# Patient Record
Sex: Female | Born: 1940 | Race: White | Hispanic: No | Smoking: Never smoker
Health system: Southern US, Community
[De-identification: ages and names within clinical notes are randomized; demographics above are authoritative.]

## PROBLEM LIST (undated history)

## (undated) DIAGNOSIS — D72820 Lymphocytosis (symptomatic): Secondary | ICD-10-CM

## (undated) DIAGNOSIS — H269 Unspecified cataract: Secondary | ICD-10-CM

## (undated) DIAGNOSIS — I5189 Other ill-defined heart diseases: Secondary | ICD-10-CM

## (undated) DIAGNOSIS — T7840XA Allergy, unspecified, initial encounter: Secondary | ICD-10-CM

## (undated) DIAGNOSIS — N183 Chronic kidney disease, stage 3 unspecified: Secondary | ICD-10-CM

## (undated) DIAGNOSIS — R739 Hyperglycemia, unspecified: Secondary | ICD-10-CM

## (undated) DIAGNOSIS — E079 Disorder of thyroid, unspecified: Secondary | ICD-10-CM

## (undated) DIAGNOSIS — I5030 Unspecified diastolic (congestive) heart failure: Secondary | ICD-10-CM

## (undated) DIAGNOSIS — R079 Chest pain, unspecified: Secondary | ICD-10-CM

## (undated) DIAGNOSIS — Z86718 Personal history of other venous thrombosis and embolism: Secondary | ICD-10-CM

## (undated) DIAGNOSIS — K8020 Calculus of gallbladder without cholecystitis without obstruction: Secondary | ICD-10-CM

## (undated) DIAGNOSIS — I10 Essential (primary) hypertension: Secondary | ICD-10-CM

## (undated) DIAGNOSIS — G919 Hydrocephalus, unspecified: Secondary | ICD-10-CM

## (undated) DIAGNOSIS — K279 Peptic ulcer, site unspecified, unspecified as acute or chronic, without hemorrhage or perforation: Secondary | ICD-10-CM

## (undated) DIAGNOSIS — K219 Gastro-esophageal reflux disease without esophagitis: Secondary | ICD-10-CM

## (undated) DIAGNOSIS — C9110 Chronic lymphocytic leukemia of B-cell type not having achieved remission: Secondary | ICD-10-CM

## (undated) DIAGNOSIS — G43909 Migraine, unspecified, not intractable, without status migrainosus: Secondary | ICD-10-CM

## (undated) DIAGNOSIS — E785 Hyperlipidemia, unspecified: Secondary | ICD-10-CM

## (undated) DIAGNOSIS — M1990 Unspecified osteoarthritis, unspecified site: Secondary | ICD-10-CM

## (undated) DIAGNOSIS — M810 Age-related osteoporosis without current pathological fracture: Secondary | ICD-10-CM

## (undated) HISTORY — PX: TONSILLECTOMY: SUR1361

## (undated) HISTORY — DX: Essential (primary) hypertension: I10

## (undated) HISTORY — DX: Gastro-esophageal reflux disease without esophagitis: K21.9

## (undated) HISTORY — DX: Unspecified cataract: H26.9

## (undated) HISTORY — PX: PARTIAL HYSTERECTOMY: SHX80

## (undated) HISTORY — PX: JOINT REPLACEMENT: SHX530

## (undated) HISTORY — DX: Other ill-defined heart diseases: I51.89

## (undated) HISTORY — PX: POLYPECTOMY: SHX149

## (undated) HISTORY — DX: Hyperglycemia, unspecified: R73.9

## (undated) HISTORY — PX: BREAST LUMPECTOMY: SHX2

## (undated) HISTORY — PX: EYE SURGERY: SHX253

## (undated) HISTORY — DX: Allergy, unspecified, initial encounter: T78.40XA

## (undated) HISTORY — PX: COLONOSCOPY: SHX174

## (undated) HISTORY — DX: Personal history of other venous thrombosis and embolism: Z86.718

## (undated) HISTORY — DX: Peptic ulcer, site unspecified, unspecified as acute or chronic, without hemorrhage or perforation: K27.9

## (undated) HISTORY — PX: CHOLECYSTECTOMY: SHX55

## (undated) HISTORY — DX: Calculus of gallbladder without cholecystitis without obstruction: K80.20

## (undated) HISTORY — PX: OTHER SURGICAL HISTORY: SHX169

## (undated) HISTORY — DX: Migraine, unspecified, not intractable, without status migrainosus: G43.909

## (undated) HISTORY — DX: Hydrocephalus, unspecified: G91.9

## (undated) HISTORY — DX: Disorder of thyroid, unspecified: E07.9

## (undated) HISTORY — DX: Chronic lymphocytic leukemia of B-cell type not having achieved remission: C91.10

## (undated) HISTORY — DX: Chronic kidney disease, stage 3 unspecified: N18.30

## (undated) HISTORY — DX: Unspecified osteoarthritis, unspecified site: M19.90

## (undated) HISTORY — DX: Chest pain, unspecified: R07.9

## (undated) HISTORY — DX: Lymphocytosis (symptomatic): D72.820

## (undated) HISTORY — DX: Age-related osteoporosis without current pathological fracture: M81.0

## (undated) HISTORY — DX: Hyperlipidemia, unspecified: E78.5

## (undated) HISTORY — PX: ABDOMINAL HYSTERECTOMY: SHX81

## (undated) HISTORY — DX: Unspecified diastolic (congestive) heart failure: I50.30

## (undated) SURGERY — MANOMETRY, ESOPHAGUS

---

## 1973-04-20 HISTORY — PX: TUBAL LIGATION: SHX77

## 1991-04-21 HISTORY — PX: BREAST CYST ASPIRATION: SHX578

## 2000-03-17 ENCOUNTER — Encounter: Payer: Self-pay | Admitting: Gastroenterology

## 2004-06-02 ENCOUNTER — Encounter: Admission: RE | Admit: 2004-06-02 | Discharge: 2004-06-02 | Payer: Self-pay | Admitting: Neurosurgery

## 2004-07-29 ENCOUNTER — Ambulatory Visit: Payer: Self-pay | Admitting: Family Medicine

## 2004-09-26 ENCOUNTER — Emergency Department (HOSPITAL_COMMUNITY): Admission: EM | Admit: 2004-09-26 | Discharge: 2004-09-26 | Payer: Self-pay | Admitting: Emergency Medicine

## 2004-12-02 ENCOUNTER — Emergency Department (HOSPITAL_COMMUNITY): Admission: EM | Admit: 2004-12-02 | Discharge: 2004-12-02 | Payer: Self-pay | Admitting: Emergency Medicine

## 2004-12-03 ENCOUNTER — Emergency Department (HOSPITAL_COMMUNITY): Admission: EM | Admit: 2004-12-03 | Discharge: 2004-12-03 | Payer: Self-pay | Admitting: Emergency Medicine

## 2005-05-27 ENCOUNTER — Ambulatory Visit (HOSPITAL_COMMUNITY): Admission: RE | Admit: 2005-05-27 | Discharge: 2005-05-27 | Payer: Self-pay | Admitting: Neurology

## 2005-06-05 ENCOUNTER — Encounter: Admission: RE | Admit: 2005-06-05 | Discharge: 2005-06-05 | Payer: Self-pay | Admitting: Neurology

## 2006-03-31 ENCOUNTER — Ambulatory Visit: Payer: Self-pay | Admitting: Internal Medicine

## 2006-04-27 ENCOUNTER — Ambulatory Visit: Payer: Self-pay | Admitting: Anesthesiology

## 2006-05-25 ENCOUNTER — Ambulatory Visit: Payer: Self-pay | Admitting: Anesthesiology

## 2006-06-22 ENCOUNTER — Ambulatory Visit: Payer: Self-pay | Admitting: Anesthesiology

## 2006-07-21 ENCOUNTER — Ambulatory Visit: Payer: Self-pay | Admitting: Anesthesiology

## 2006-08-19 ENCOUNTER — Ambulatory Visit: Payer: Self-pay | Admitting: Internal Medicine

## 2006-08-31 ENCOUNTER — Ambulatory Visit: Payer: Self-pay | Admitting: Anesthesiology

## 2006-11-18 ENCOUNTER — Ambulatory Visit: Payer: Self-pay | Admitting: Anesthesiology

## 2008-09-12 ENCOUNTER — Encounter: Admission: RE | Admit: 2008-09-12 | Discharge: 2008-09-12 | Payer: Self-pay | Admitting: Neurology

## 2009-03-07 ENCOUNTER — Telehealth: Payer: Self-pay | Admitting: Gastroenterology

## 2009-03-25 ENCOUNTER — Encounter (INDEPENDENT_AMBULATORY_CARE_PROVIDER_SITE_OTHER): Payer: Self-pay

## 2009-03-26 ENCOUNTER — Ambulatory Visit: Payer: Self-pay | Admitting: Gastroenterology

## 2009-04-20 HISTORY — PX: OTHER SURGICAL HISTORY: SHX169

## 2009-04-23 ENCOUNTER — Ambulatory Visit: Payer: Self-pay | Admitting: Gastroenterology

## 2009-04-23 LAB — HM COLONOSCOPY

## 2009-04-25 ENCOUNTER — Encounter: Payer: Self-pay | Admitting: Gastroenterology

## 2009-11-18 LAB — HM MAMMOGRAPHY: HM Mammogram: ABNORMAL

## 2010-04-02 ENCOUNTER — Telehealth: Payer: Self-pay | Admitting: Internal Medicine

## 2010-05-06 ENCOUNTER — Emergency Department (HOSPITAL_COMMUNITY)
Admission: EM | Admit: 2010-05-06 | Discharge: 2010-05-06 | Payer: Self-pay | Source: Home / Self Care | Admitting: Emergency Medicine

## 2010-05-07 LAB — POCT I-STAT, CHEM 8
BUN: 27 mg/dL — ABNORMAL HIGH (ref 6–23)
Calcium, Ion: 1.17 mmol/L (ref 1.12–1.32)
Chloride: 106 mEq/L (ref 96–112)
Creatinine, Ser: 1.2 mg/dL (ref 0.4–1.2)
Glucose, Bld: 105 mg/dL — ABNORMAL HIGH (ref 70–99)
HCT: 42 % (ref 36.0–46.0)
Hemoglobin: 14.3 g/dL (ref 12.0–15.0)
Potassium: 3.2 mEq/L — ABNORMAL LOW (ref 3.5–5.1)
Sodium: 141 mEq/L (ref 135–145)
TCO2: 25 mmol/L (ref 0–100)

## 2010-05-11 ENCOUNTER — Encounter: Payer: Self-pay | Admitting: Neurosurgery

## 2010-05-21 NOTE — Procedures (Signed)
Summary: Colonoscopy  [name] Note: All result statuses are Final unless otherwise noted.  Tests: (1) Colonoscopy (COL)   COL Colonoscopy           DONE     [facility]     [address]     [place], [place]  [address]           COLONOSCOPY PROCEDURE REPORT           PATIENT:  Cathy, Barnes  MR#:  [id number]     BIRTHDATE:  1940/09/02, 68 yrs. old  GENDER:  female           ENDOSCOPIST:  Judie Petit T. Russella Dar, MD, [facility]           PROCEDURE DATE:  04/23/2009     PROCEDURE:  Colonoscopy with snare polypectomy     ASA CLASS:  Class II     INDICATIONS:  1) Elevated Risk Screening  2) family history of     colon cancer-father at 89 and mother at 89.           MEDICATIONS:   Fentanyl 100 mcg IV, Versed 13 mg IV           DESCRIPTION OF PROCEDURE:   After the risks benefits and     alternatives of the procedure were thoroughly explained, informed     consent was obtained.  Digital rectal exam was performed and     revealed no abnormalities.   The [facility] PCF-Q180AL [id number] endoscope     was introduced through the anus and advanced to the cecum, which     was identified by both the appendix and ileocecal valve, without     limitations.  The quality of the prep was good, using MoviPrep.     The instrument was then slowly withdrawn as the colon was fully     examined.     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 It was     mild.  A sessile polyp was found in the cecum. It was 4 mm in     size. Polyp was snared without cautery. Retrieval was successful.     This was otherwise a normal examination of the colon. Retroflexed     views in the rectum revealed no abnormalities.  The time to cecum     =  3  minutes. The scope was then withdrawn (time =  8.25  min)     from the patient and the procedure completed.           COMPLICATIONS:  None           ENDOSCOPIC IMPRESSION:     1) Melanosis throughout the colon     2) 4 mm  sessile polyp in the cecum           RECOMMENDATIONS:     1) Await pathology results     2) Repeat Colonoscopy in 5 years.           Venita Lick. Russella Dar, MD, [facility]           CC: Lorie Phenix, MD           n.     Rosalie DoctorVenita Lick. Jadiel Schmieder at 04/23/2009 11:09 AM           Cathy Barnes, [id number]  Note: An exclamation mark (!) indicates a result that was not dispersed  into the flowsheet. Document Creation Date: 04/23/2009 11:08 AM _______________________________________________________________________  (1) Order result status: Final Collection or observation date-time: 04/23/2009 11:05 Requested date-time:  Receipt date-time:  Reported date-time:  Referring Physician:   Ordering Physician: Claudette Head [phone]) Specimen Source:  Source: Launa Grill Order Number: [id number] Lab site:   Appended Document: Colonoscopy     Procedures Next Due Date:    Colonoscopy: 04/2014

## 2010-05-21 NOTE — Letter (Signed)
Summary: Patient Notice- Polyp Results  [facility]  [address] [place], [place] [address]   Phone: [phone]  Fax: [phone]        April 25, 2009 MRN: [id number]    [facility] [address] [place], [place]  [address]    Dear Ms. Brickman,  I am pleased to inform you that the colon polyp(s) removed during your recent colonoscopy was (were) found to be benign (no cancer detected) upon pathologic examination.  I recommend you have a repeat colonoscopy examination in 5 years to look for recurrent polyps, as having colon polyps increases your risk for having recurrent polyps or even colon cancer in the future.  Should you develop new or worsening symptoms of abdominal pain, bowel habit changes or bleeding from the rectum or bowels, please schedule an evaluation with either your primary care physician or with me.  Continue treatment plan as outlined the day of your exam.  Please call us if you are having persistent problems or have questions about your condition that have not been fully answered at this time.  Sincerely,  Meryl Dare MD [facility]  This letter has been electronically signed by your physician.  Appended Document: Patient Notice- Polyp Results Letter mailed 1.7.11

## 2010-05-22 NOTE — Progress Notes (Signed)
Summary: appt pending?  Phone Note Call from Patient Call back at Home Phone [phone]   Caller: Patient Call For: wert Summary of Call: pt states that she got a call stating she has an appt on 12/16. she doesn't recall making any appts with any [facility] dr's. i told her i didn't see an appt scheduled but she requests that the nurse confirm this as she has been confused with another Desree griffine (with the same DOB).  Initial call taken by: Tivis Ringer, CNA,  April 02, 2010 11:22 AM  Follow-up for Phone Call        advised pt there are no pending appts for her and that it was a mistake with the automated system. the pt that is scheduled in the slot she was calld about has the same # as her. it is a new consult for MW. Dot Lanes was advised to check pt phone number if they come in for appt. Carron Curie CMA  April 02, 2010 1:57 PM

## 2010-08-15 ENCOUNTER — Ambulatory Visit: Payer: Self-pay | Admitting: Family Medicine

## 2010-08-19 ENCOUNTER — Ambulatory Visit: Payer: Self-pay | Admitting: Family Medicine

## 2010-09-05 NOTE — Assessment & Plan Note (Signed)
[facility]                             PULMONARY OFFICE NOTE   NAME:Cathy Barnes, Cathy Barnes                       MRN:          [id number]  DATE:03/31/2006                            DOB:          [birth year]    REFERRING PHYSICIAN:  Lorie Phenix   REASON FOR CONSULTATION:  Dyspnea.   HISTORY:  This is a 70 year old white female, never-smoker, with new-  onset dyspnea in the setting of having previously been symptomatic in  the fall only with watery rhinitis and watery eyes, but now, in the last  4 weeks, having gradual and worsening dyspnea with no associated  rhinitis.  However, she does have severe coughing paroxysms that occur  when she is active and all it takes is walking across the parking lot  before she gives out due to both dyspnea and cough.  She also has  subjective wheezing that occurs at night when she lies down  associated with intermittent acid heartburn.   She denies any pleuritic or exertional chest pain, orthopnea, PND,  purulent sputum, active sinus complaints or leg swelling.   PAST MEDICAL HISTORY:  1. Hypertension.  2. Hyperlipidemia.  3. Previous seasonal allergies, as noted above.  4. Hysterectomy.   ALLERGIES:  None known.   MEDICATIONS:  Taken in detail on the work sheet and include both Spiriva  and albuterol, which she says do not help.   SOCIAL HISTORY:  She has never smoked.  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   FAMILY HISTORY:  Reviewed in detail with the patient, recorded on the  worksheet and significant for allergies and emphysema in her father.   REVIEW OF SYSTEMS:  Taken in detail on the worksheet and negative,  except as outline above.   PHYSICAL EXAMINATION:  This is a slightly anxious, but pleasant,  ambulatory, obese white female in no acute distress.  VITAL SIGNS:  Stable.  HEENT:  Unremarkable.  Oropharynx is clear.  There is no evidence of  excessive  postnasal drainage or cobblestoning.  Nasal turbinates are  normal.  Ear canals are clear bilaterally.  NECK:  Supple without cervical adenopathy or tenderness.  Trachea is  midline with no thyromegaly.  Lung fields are perfectly clear bilaterally to auscultation and  percussion.  There is a regular rate and rhythm without murmur, gallop or rub  present.  ABDOMEN:  Soft and benign with no palpable organomegaly, masses or  tenderness.  EXTREMITIES:  Warm without calf tenderness, cyanosis, clubbing or edema.   CT SCAN:  Dated March 18, 2006 indicates minimal COPD with no  abnormalities.   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  The inspiratory loop is not available, but  probably which showed the same thing.   IMPRESSION:  Dyspnea is markedly disproportionate to objective findings  and is associated with upper airway coughing and a nocturnal  wheeze that do not respond to albuterol and Spiriva.  This  strongly suggests reflux as a mechanism.  The fact that this patient  never smoked and only has minimal COPD, a CT scan virtually  excludes chronic obstructive pulmonary disease from the differential.  I  note also the onset of dyspnea associated with cough pretty much  excludes a cardiac mechanism and I understand that so far the search for  a cardiac mechanism explaining her dyspnea has been fruitless, as I  would expect.   I therefore recommended the following:   RECOMMENDATION:  1. Initiate Prevacid 30 mg one daily before her first and last meal      until better and then reduce the dose down to one daily.  2. Stop all inhalers, which may irritate the upper airway and actually      contribute to the problem.  3. Stop all menthol products and institute the diet I reviewed with      her in detail today in writing.  4. For cough, use Delsym two teaspoons every 12 hours; if still      coughing, add tramadol one every 4 hours  to the Delsym.  5. Followup will be in 4-6 weeks with a CPST, if she is still having      reproducible shortness of breath with such minimal activity.     Charlaine Dalton. Sherene Sires, MD, [facility]  Electronically Signed    MBW/MedQ  DD: 04/01/2006  DT: 04/01/2006  Job #: [id number]   cc:   Cristy Hilts. Jacinto Halim, MD  Lorie Phenix

## 2010-09-05 NOTE — Assessment & Plan Note (Signed)
[facility]                             PULMONARY OFFICE NOTE   NAME:Cathy Barnes, Cathy Barnes                       MRN:          [id number]  DATE:08/19/2006                            DOB:          06-10-1940    PULMONARY/EXTENDED FOLLOW-UP OFFICE VISIT:   CHIEF COMPLAINT:  Dyspnea.   HISTORY:  This is a 70 year old white female who was seen on March 31, 2006 with new onset of dyspnea that was so bad that she could  barely get across a parking lot, associated with subjective wheezing  that occurred nocturnally when she would lie down and also had  intermittent overt reflux symptoms.  PFTs showed classic expiratory  truncation on expiration, typical (inspiratory loop not available) of  vocal cord dysfunction.  She was empirically therefore started on  Prevacid 30 mg 1 daily and asked to stop all inhalers, which I thought  might be aggravating the upper airway problems.   I had recommended a CPST if not improved.  Since that time, she says  that she initially did get 100% improvement in terms of both dyspnea and  cough but when switched from Prevacid to Nexium by her insurance  company, she began to have increasing dyspnea, again, back to the point  where she can barely get across the parking lot.  She has also  developed unexplained chest pain syndrome that is not pleuritic in  nature, with a negative CT scan, within the last several weeks, (not  available to me today) with a negative cardiac workup and has been  referred to a pain center, which is not helping.   Presently, she says that she is having shortness of breath walking  anymore than several hundred feet, and especially if she goes up steps.  She denies any exertional exacerbation of chest pain, pleuritic pain,  fevers, chills, sweats, cough, orthopnea, PND, or leg swelling.   Medications reviewed with the patient in detail, inventory, dated Aug 19, 2006, noting that she is on no pulmonary  medicines at all and  maintaining Nexium daily.  She is also implementing the diet she was  given.   PHYSICAL EXAMINATION:  GENERAL:  She is a depression-appearing,  ambulatory, moderately obese white female in no acute distress.  VITAL SIGNS:  She is afebrile with stable vital signs.  HEENT:  Unremarkable.  Her oropharynx is clear.  LUNGS:  Completely clear bilaterally to auscultation and percussion.  HEART:  Regular rhythm without murmur, rub or gallop.  ABDOMEN:  Soft, benign.  EXTREMITIES:  Warm without calf tenderness, clubbing, cyanosis or edema.   Recent CT scan not available for review today.   We walked the patient around the office, three laps, which was a total  of almost 600 feet.  She did fine with the first lap (which was 185 feet  and at a faster pace than what she is used to doing) and was able to  actually complete two more laps but desaturated into the 87% level to  the end.  She did not, however, appear to be significantly tachypneic,  thus  not reproducing her complaint of being short of breath walking  across the parking lot, although certainly worrisome for desaturation.   IMPRESSION:  Unexplained dyspnea with exertion in the absence of cough  or obvious upper airway dysfunction, associated with desaturations that  we could reproduce here in the office.  She tells me this problem has  been present for months and had a CT scan done within the last month, so  we will have a chance to look for a CT scan to see if there is any  evidence of thromboembolic disease or interstitial lung disease, and I  have asked her to fax this to me as soon as possible.   Clearly, upper airway dysfunction related to reflux does not cause  desaturation with exercise but could cause enough interstitial changes  via direct acid mechanism to desaturated, and therefore, I have asked  her to continue PPI therapy, but since she insists that Prevacid works  better than Nexium, I gave her a  month's worth and Prevacid, to see if  this was the case.   I note she has unexplained chest pain also, but it is not pleuritic, and  apparently the CT scan should have ruled out pleural-based process or  obvious chest wall problem that would shed light on what appears to be a  neuropathic pain.  I will defer the pain management issue; therefore, to  Dr. Santiago Bur capable hands.     Charlaine Dalton. Sherene Sires, MD, [facility]  Electronically Signed    MBW/MedQ  DD: 08/19/2006  DT: 08/20/2006  Job #: [id number]   cc:   Dr. Elease Hashimoto

## 2010-09-05 NOTE — Op Note (Signed)
[name], Cathy Barnes                ACCOUNT NO.:  [id number]   MEDICAL RECORD NO.:  [id number]          PATIENT TYPE:  OUT   LOCATION:  [facility]                          FACILITY:  [facility]   PHYSICIAN:  Genene Churn. Love, M.D.    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  First seen by me May 04, 2005 with positive family  history of normal pressure hydrocephalus.  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   DESCRIPTION OF PROCEDURE:  The patient was prepped and draped in the left  lateral decubitus position using Betadine 1% Xylocaine.  L4-L5 was entered  without difficulty.  Opening pressure was 140 cmH2O, 33 mL of clear  colorless CSF was obtained.  Two chairs with 20 steps of my own with a  stand/walk/sit test six times revealed before LP time of 1 minute 28 seconds  and after LP 1 minute and 17 seconds.  Her gait, however, was definitely  steadier with the second test.   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  Other  cerebrospinal fluid studies pending will be protein, glucose, cell count,  differential, IgG and oligoclonal IgG.  The patient tolerated the procedure  well.  Hydrocodone was given for a headache.           ______________________________  Genene Churn. Sandria Manly, M.D.     JML/MEDQ  D:  05/27/2005  T:  05/27/2005  Job:  [id number]

## 2010-09-19 ENCOUNTER — Ambulatory Visit: Payer: Self-pay | Admitting: Family Medicine

## 2010-10-19 ENCOUNTER — Ambulatory Visit: Payer: Self-pay | Admitting: Family Medicine

## 2010-10-29 ENCOUNTER — Other Ambulatory Visit: Payer: Self-pay | Admitting: Dermatology

## 2012-12-14 ENCOUNTER — Encounter: Payer: Self-pay | Admitting: Neurology

## 2012-12-14 ENCOUNTER — Ambulatory Visit (INDEPENDENT_AMBULATORY_CARE_PROVIDER_SITE_OTHER): Payer: 59 | Admitting: Neurology

## 2012-12-14 VITALS — BP 105/69 | HR 61 | Temp 97.9°F | Ht 63.25 in | Wt 174.0 lb

## 2012-12-14 DIAGNOSIS — R413 Other amnesia: Secondary | ICD-10-CM

## 2012-12-14 DIAGNOSIS — G43909 Migraine, unspecified, not intractable, without status migrainosus: Secondary | ICD-10-CM

## 2012-12-14 HISTORY — DX: Migraine, unspecified, not intractable, without status migrainosus: G43.909

## 2012-12-14 NOTE — Patient Instructions (Addendum)
I think overall you are doing fairly well and are stable at this point.   I do have some generic suggestions for you today:  Please make sure that you drink plenty of fluids. I would like for you to exercise daily for example in the form of walking 20-30 minutes every day, if you can. Please keep a regular sleep-wake schedule, keep regular meal times, do not skip any meals, eat  healthy snacks in between meals, such as fruit or nuts. Try to eat protein with every meal.   As far as your medications are concerned, I would like to suggest:  No changes.   As far as diagnostic testing, I recommend: CT head.  Engage in social activities in your community and with your family and try to keep up with current events by reading the newspaper or watching the news.  I will see you back in 6 months.   Please also call us for any test results so we can go over those with you on the phone. Brett Canales is my clinical assistant and will answer any of your questions and relay your messages to me and will give you my messages.   Our phone number is [phone]. We also have an after hours call service for urgent matters and there is a physician on-call for urgent questions. For any emergencies you know to call 911 or go to the nearest emergency room.

## 2012-12-14 NOTE — Progress Notes (Signed)
Subjective:    Patient ID: Cathy Barnes is a 72 y.o. female.  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 She had CSF findings positive for oligoclonal bands in the past. I first met her on 06/15/2012, at which time and felt she was stable. She had no significant tremor at the time. Her headaches were well controlled. She had clinical evidence of bilateral carpal tunnel syndrome right more than left for which I asked her to continue using wrist splints. Her last EMG and nerve conduction velocity tests from June 2013 was normal. She was advised to continue with gabapentin. Her current medications include vitamin D, Crestor, Ambien, Topamax, Xanax, losartan, hydrocodone as needed, verapamil, Maxzide, Cathy Barnes, baby aspirin, levothyroxine, cyclobenzaprine, gabapentin, atenolol. CT head in January 2012 showed no intracranial abnormality. 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 Headaches are worse by bending over or sneezing. She sleeps with a head of bed elevated because of reflux. She is on cyclobenzaprine 10 mg nightly and gabapentin 200 mg in the morning and noon and 300 mg at night. She is to see Dr. Fayrene Fearing love and was last seen by him in October 2013 at which time he asked her to increase gabapentin to 300 mg 3 times a day but she could not tolerate it as she was too sedated and too nauseated. She indicated that her last appointment with me that she was going to see a Hydrographic surveyor in March. She is on [email or web address] for memory training. She is on Topamax for headache prevention and her tremors. She has been on Topamax for the past 10 years when she went to [facility]. She takes 25 mg in AM and  200 mg qHS. She has done biofeedback and has tried Imitrex injections for her migraine headaches. She reports that her HAs are worse, she has noted some word finding difficulties. She has had some memory loss and worries about dementia. There is no family history of dementia. She has no personal history of confusion, disorientation, no hallucinations. She feels, that her HAs are not her migraines, but feels more tension HA. She takes Flexeril at night and no longer wakes up with migraines for the past years. Her night time medication regiment seems to work. She reports more hand pain and more back pain. She is somewhat tearful today. She drives to [facility] to see her GD and works part time, 2/week.   Her Past Medical History Is Significant For: History reviewed. No pertinent past medical history.  Her Past Surgical History Is Significant For: Past Surgical History  Procedure Laterality Date  . Partial hysterectomy      Her Family History Is Significant For: No family history on file.  Her Social History Is Significant For: History   Social History  . Marital Status: Divorced    Spouse Name: N/A    Number of Children: 1  . Years of Education: college   Occupational History  . book keeping Other    [employer]   Social History Main Topics  . Smoking status: Never Smoker   . Smokeless tobacco: None  . Alcohol Use: No  . Drug Use: No  . Sexual Activity: None   Other Topics Concern  . None  Social History Narrative  . None    Her Allergies Are:  Not on File:   Her Current Medications Are:  Outpatient Encounter Prescriptions as of 12/14/2012  Medication Sig Dispense Refill  . ALPRAZolam (XANAX) 0.25 MG tablet Take 0.25 mg by mouth once. Take two tablets once at bedtime      . atenolol (TENORMIN) 50 MG tablet Take 50 mg by mouth every morning.      [name] CRESTOR 10 MG tablet Take 10 mg by mouth once.      . cyclobenzaprine (FLEXERIL) 10 MG tablet Take 10 mg by mouth once.       [name] FLUZONE HIGH-DOSE injection Inject 180 mLs into the muscle once.      . gabapentin (NEURONTIN) 100 MG capsule Take 100 mg by mouth 2 (two) times daily. Take 2 tablet in am and 2 tablet at lunch.      . gabapentin (NEURONTIN) 300 MG capsule Take 300 mg by mouth daily. Take one tablet 300mg  at bedtime      . levothyroxine (SYNTHROID, LEVOTHROID) 75 MCG tablet Take 75 mcg by mouth once.      [name] losartan (COZAAR) 50 MG tablet Take 50 mg by mouth once.      . potassium chloride SA (K-DUR,KLOR-CON) 20 MEQ tablet Take 20 mEq by mouth 2 (two) times daily.      [name] topiramate (TOPAMAX) 100 MG tablet Take 100 mg by mouth 2 (two) times daily.      [name] topiramate (TOPAMAX) 25 MG capsule Take 25 mg by mouth once.      . triamterene-hydrochlorothiazide (MAXZIDE) 75-50 MG per tablet Take 75 tablets by mouth once. Take 1/2 tablet once a day      . verapamil (VERELAN PM) 120 MG 24 hr capsule Take 120 mg by mouth once.      . Vitamin D, Ergocalciferol, (DRISDOL) 50000 UNITS CAPS capsule Take 50,000 Units by mouth every 30 (thirty) days.      [name] ZETIA 10 MG tablet Take 10 mg by mouth once.      [name] zolpidem (AMBIEN) 10 MG tablet Take 10 mg by mouth once.       No facility-administered encounter medications on file as of 12/14/2012.   Review of Systems  HENT:       Ringing in ear  Neurological: Positive for headaches.    Objective:  Neurologic Exam  Physical Exam Physical Examination:   Filed Vitals:   12/14/12 1147  BP: 105/69  Pulse: 61  Temp: 97.9 F (36.6 C)    General Examination: The patient is a very pleasant 72 y.o. female in no acute distress. She appears well-developed and well-nourished and well groomed.   HEENT exam: Normocephalic, atraumatic, pupils are equal, round and reactive to light and accommodation. Extraocular tracking is good with normal smooth pursuit and no nystagmus noted. Face is symmetric with normal facial animation. Speech is clear. She has no lip, neck or jaw tremor. No  voice tremor. Hearing is grossly intact. Neck is supple with full range of motion. Chest is clear to auscultation without wheezing or rhonchi noted. Abdomen is soft, nontender with normal bowel sounds appreciated. Heart sounds are normal with no murmurs, rubs or gallops noted. She has no pitting edema in the distal lower extremities. Skin is warm and dry. She is a little tender on touch in the right wrist area. She has evidence of a beginning Dupuytren's contracture on the left. She has no obvious joint swelling in her  right wrist or redness or change in temperature and the overlying skin. She has no significant hand tremors, in particular no resting or postural or action tremor in both upper extremities. She has intact fine motor skills. She has normal finger taps. Finger to nose testing is normal. Neurologically: Mental status: The patient is awake, alert and oriented in all 4 spheres. Her memory, language, attention and language are appropriate. Cranial nerves are as described above. Motor exam reveals as described above and in addition reflexes are 1+ throughout. Sensory exam is intact to light touch, pinprick, vibration and temperature sense. She does have a slight degree of hypersensitivity in her right palm specially the thenar eminence. Gait, station and balance are slightly insecure but otherwise good. Romberg testing shows minimal swaying but no corrective steps.  Assessment and Plan:   In summary, Cathy Barnes is a very pleasant 72 y.o.-year old female with a history of multiple neurological issues, and several different complaints but overall her exam has remained stable.  Her migraines are well controlled but she may have had increase in tension headaches. I am not quite sure asked to how to help her by optimizing her medication regimen. I explained to her that she is already on multiple potentially sedating medications and I really would not add anything at this time. She's already on a muscle  relaxant. She is already on gabapentin and Topamax. She is taking a fairly decent amount of Topamax. I did point out to her that Topamax can cause some word finding difficulties and cognitive side effects but on the other hand she has been on it for many years without recent dose change. At one point in the past she was told that she had fluid buildup around her brain. She was not clinically diagnosed with NPH and does not appear to have any clinical signs of NPH. Nevertheless I suggested that we go ahead and repeat her head CT. Otherwise I asked her to continue with the same medications and we also talked about potential headache triggers such as dehydration, overheating, bright lights, skipping meals, stress, sleep deprivation and so forth. I suggested that she followup with me in 6 months, sooner if the need arises. She is encouraged to call with any interim questions, concerns, problems or updates. She was in agreement.

## 2012-12-26 ENCOUNTER — Ambulatory Visit
Admission: RE | Admit: 2012-12-26 | Discharge: 2012-12-26 | Disposition: A | Discharge status: Home / Self Care | Payer: Medicare PPO | Source: Ambulatory Visit | Attending: Neurology | Admitting: Neurology

## 2012-12-26 DIAGNOSIS — G43909 Migraine, unspecified, not intractable, without status migrainosus: Secondary | ICD-10-CM

## 2012-12-26 DIAGNOSIS — R413 Other amnesia: Secondary | ICD-10-CM

## 2013-01-03 ENCOUNTER — Telehealth: Payer: Self-pay | Admitting: Neurology

## 2013-01-05 NOTE — Telephone Encounter (Signed)
I called pt and gave her the results of CT.  No change from 2012/Jan report.  She verbalized understanding.

## 2013-02-24 ENCOUNTER — Other Ambulatory Visit: Payer: Self-pay | Admitting: Neurology

## 2013-02-24 MED ORDER — GABAPENTIN 300 MG PO CAPS: 300.0000 mg | ORAL_CAPSULE | Freq: Every day | ORAL | Status: DC

## 2013-06-14 ENCOUNTER — Ambulatory Visit (INDEPENDENT_AMBULATORY_CARE_PROVIDER_SITE_OTHER): Payer: 59 | Admitting: Neurology

## 2013-06-14 ENCOUNTER — Encounter: Payer: Self-pay | Admitting: Neurology

## 2013-06-14 ENCOUNTER — Encounter (INDEPENDENT_AMBULATORY_CARE_PROVIDER_SITE_OTHER): Payer: Self-pay

## 2013-06-14 VITALS — BP 125/74 | HR 65 | Temp 97.0°F | Ht 63.5 in | Wt 170.5 lb

## 2013-06-14 DIAGNOSIS — G43909 Migraine, unspecified, not intractable, without status migrainosus: Secondary | ICD-10-CM

## 2013-06-14 MED ORDER — SUMATRIPTAN SUCCINATE 25 MG PO TABS: 25.0000 mg | ORAL_TABLET | ORAL | Status: DC | PRN

## 2013-06-14 NOTE — Patient Instructions (Signed)
Please remember, common headache triggers are: sleep deprivation, dehydration, overheating, stress, hypoglycemia or skipping meals and blood sugar fluctuations, excessive pain medications or excessive alcohol use or caffeine withdrawal. Some people have food triggers such as aged cheese, orange juice or chocolate, especially dark chocolate, or MSG (monosodium glutamate). Try to avoid these headache triggers as much possible. It may be helpful to keep a headache diary to figure out what makes your headaches worse or brings them on and what alleviates them. Some people report headache onset after exercise but studies have shown that regular exercise may actually prevent headaches from coming. If you have exercise-induced headaches, please make sure that you drink plenty of fluid before and after exercising and that you do not over do it and do not overheat.  We will try imitrex in low dose (25 mg) as needed for abortive treatment of your severe headaches. Take 1 pill early on when you suspect a migraine attack come on. You may take another pill within 2 hours, no more than 2 pills in 24 hours. Most people who take triptans do not have any serious side-effects. However, they can cause drowsiness (remember to not drive or use heavy machinery when drowsy), nausea, dizziness, dry mouth. Less common side effects include strange sensations, such as tightness in your chest or throat, tingling, flushing, and feelings of heaviness or pressure in areas such as the face, limbs, and chest. These in the chest can mimic heart related pain (angina) and may cause alarm, but usually these sensations are not harmful or a sign of a heart attack. However, if you develop intense chest pain or sensations of discomfort, you should stop taking your medication and consult with me or your PCP or go to the nearest urgent care facility or ER or call 911.

## 2013-06-14 NOTE — Progress Notes (Signed)
Subjective:    Patient ID: Cathy Barnes is a 73 y.o. female.  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 She had CSF findings positive for oligoclonal bands in the past. She is unaccompanied today. I last saw her on 12/14/2012 at which time it did not make any medication changes. I ordered a repeat head CT. She had head CT without contrast on 12/26/2012: Equivocal CT head (without) demonstrating mild chronic small vessel ischemic disease. No acute findings. No hydrocephalus. No change from CTA on 05/06/10. In addition, personally reviewed the images through the PACS system.  Today, she is a little tearful, as she lost her son-in-law at age 65 to an MI some 6 weeks ago. She reports ongoing issues with headaches. She and her family are still dealing with the loss. She has had her eyes checked. She had cataract surgeries in 2011 (10/01/09 and 10/28/09). She is borderline diabetic, diet controlled. She is on two cholesterol medications. For HA prevention, she is on gabapentin (200 mg - 200 mg - 300 mg), topiramate, a betablocker, a calcium channel blocker, and Flexeril. She was Imitrex injections years ago, for her severe migraines. HAs are no more in AM, they are in the afternoons. They are not daily. She feels, she has come a long way with her HAs. She was on an antidepressant in the past. She has been mildly depressed. She denies SI/HI.   I first met her on 06/15/2012, at which time and felt she was stable. She had no significant tremor at the time. Her headaches were well controlled. She had clinical evidence of bilateral carpal tunnel syndrome right more than left for which I asked her to continue using wrist splints. Her last EMG and nerve  conduction velocity tests from June 2013 was reported as normal. She was advised to continue with gabapentin, she was also on vitamin D, Crestor, Ambien, Topamax, Xanax, losartan, hydrocodone as needed, verapamil, Maxzide, Cynthia, baby aspirin, levothyroxine, cyclobenzaprine, gabapentin, atenolol.  CT head in January 2012 showed no intracranial abnormality. 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  Headaches are worse by bending over or sneezing. She sleeps with a head of bed elevated because of reflux. She is on cyclobenzaprine 10 mg nightly and gabapentin 200 mg in the morning and noon and 300 mg at night. She previously followed with Dr. Morene Antu and was last seen by him in October 2013 at which time he asked her to increase gabapentin to 300 mg 3 times a day but she could not tolerate it as she was too sedated and too nauseated. She is on [email or web address] for memory training. She is on Topamax for headache prevention and her tremors. She has been on Topamax for the past 10 years when she went to [facility]. She takes 25 mg in AM and 200 mg qHS. She has done biofeedback and has tried Imitrex injections for her migraine headaches.    Her Past Medical History Is Significant For: Past Medical History  Diagnosis Date  . Migraine, unspecified, without mention of intractable migraine without mention of status migrainosus 12/14/2012    Her Past Surgical History Is Significant For: Past Surgical History  Procedure Laterality Date  . Partial hysterectomy  Her Family History Is Significant For: No family history on file.  Her Social History Is Significant For: History   Social History  . Marital Status: Divorced    Spouse Name: N/A    Number of Children: 1  . Years of Education: college   Occupational History  . book keeping Other    [employer]   Social History Main Topics  . Smoking status: Never Smoker   .  Smokeless tobacco: Never Used  . Alcohol Use: No  . Drug Use: No  . Sexual Activity: None   Other Topics Concern  . None   Social History Narrative   Patient is right handed, resides alone.    Her Allergies Are:  Not on File:   Her Current Medications Are:  Outpatient Encounter Prescriptions as of 06/14/2013  Medication Sig  . ALPRAZolam (XANAX) 0.25 MG tablet Take 0.25 mg by mouth once. Take two tablets once at bedtime  . aspirin 81 MG tablet Take 81 mg by mouth daily.  [name] atenolol (TENORMIN) 50 MG tablet Take 50 mg by mouth every morning.  [name] CRESTOR 10 MG tablet Take 10 mg by mouth once.  . cyclobenzaprine (FLEXERIL) 10 MG tablet Take 10 mg by mouth once.  [name] FLUZONE HIGH-DOSE injection Inject 180 mLs into the muscle once.  . gabapentin (NEURONTIN) 100 MG capsule Take 100 mg by mouth 2 (two) times daily. Take 2 tablet in am and 2 tablet at lunch.  . gabapentin (NEURONTIN) 300 MG capsule Take 1 capsule (300 mg total) by mouth daily. Take one tablet 347m at bedtime  . levothyroxine (SYNTHROID, LEVOTHROID) 75 MCG tablet Take 75 mcg by mouth once.  .Marland Kitchenlosartan (COZAAR) 50 MG tablet Take 50 mg by mouth once.  . potassium chloride SA (K-DUR,KLOR-CON) 20 MEQ tablet Take 20 mEq by mouth 2 (two) times daily.  .Marland Kitchentopiramate (TOPAMAX) 100 MG tablet Take 100 mg by mouth 2 (two) times daily.  .Marland Kitchentopiramate (TOPAMAX) 25 MG capsule Take 25 mg by mouth once.  . triamterene-hydrochlorothiazide (MAXZIDE) 75-50 MG per tablet Take 75 tablets by mouth once. Take 1/2 tablet once a day  . verapamil (VERELAN PM) 120 MG 24 hr capsule Take 120 mg by mouth once.  . Vitamin D, Ergocalciferol, (DRISDOL) 50000 UNITS CAPS capsule Take 50,000 Units by mouth every 30 (thirty) days.  .Marland KitchenZETIA 10 MG tablet Take 10 mg by mouth once.  .Marland Kitchenzolpidem (AMBIEN) 10 MG tablet Take 10 mg by mouth once.   Review of Systems:  Out of a complete 14 point review of systems, all are reviewed and negative with the exception of these  symptoms as listed below:   Review of Systems  HENT:       Ringing in the ear.  Neurological: Positive for tremors, numbness and headaches.    Objective:  Neurologic Exam  Physical Exam Physical Examination:   Filed Vitals:   06/14/13 1208  BP: 125/74  Pulse: 65  Temp: 97 F (36.1 C)   General Examination: The patient is a very pleasant 73y.o. female in no acute distress. She appears well-developed and well-nourished and well groomed. She is mildly depressed appearing.  HEENT exam: Normocephalic, atraumatic, pupils are equal, round and reactive to light and accommodation. Extraocular tracking is good with normal smooth pursuit and no nystagmus noted. Face is symmetric with normal facial animation. Speech is clear. She has no lip, neck or jaw tremor. No voice tremor. Hearing is grossly intact. Neck is supple  with full range of motion.  Chest is clear to auscultation without wheezing or rhonchi noted. Abdomen is soft, nontender with normal bowel sounds appreciated. Heart sounds are normal with no murmurs, rubs or gallops noted. She has no pitting edema in the distal lower extremities. Skin is warm and dry.  She has evidence of a beginning Dupuytren's contracture on the left. She has no significant hand tremors, in particular no resting or postural or action tremor in both upper extremities. She has intact fine motor skills. She has normal finger taps. Finger to nose testing is normal. Neurologically: Mental status: The patient is awake, alert and oriented in all 4 spheres. Her memory, language, attention and language are appropriate. Remote and recent memories seem to be intact. Cranial nerves are as described above. Motor exam reveals normal bulk, strength and tone, reflexes are 1+ throughout. Sensory exam is intact to light touch, pinprick, vibration and temperature sense. Gait, station and balance are slightly insecure but otherwise good. Romberg testing shows minimal swaying but no  corrective steps. She is able to perform tandem walk with slight difficulty. She needs no assistance. She turns in 3 steps.  Assessment and Plan:   In summary, BERENICE OEHLERT is a very pleasant 73 year old female with a history of tremors and migraine headaches who presents for followup consultation. While over the course of years, she has had significant improvement of her debilitating severe migraines, she still has intermittent migraines. As far as migraine prevention she is on multiple medications that we utilize her migraine prevention. She has had some depression recently and she is encouraged to discuss depression management with her primary care physician. For abortive treatment of her migraines I suggested low-dose Imitrex as she had success with injectable Imitrex in the past. Because she is on 5 blood pressure medications at this time I would like to be cautious with the use of Imitrex. We will utilize the smallest dose, namely 25 mg strength and she is to use it only as needed, no more than 50 mg and 24 hours and no more than 3 pills in a week. She was advised about common side effects including tingling, chest tightness and so forth and she remembers this from previously. Her exam has remained stable. We talked about her recent head CT findings, which show stability of her brain appearance. I talked to her about potential headache triggers such as dehydration, overheating, bright lights, skipping meals, stress, sleep deprivation again today. Unfortunately, she has had some interim family related stress. I suggested that she followup with me in 6 months, sooner if the need arises. She is encouraged to call with any interim questions, concerns, problems or updates. She was in agreement.

## 2013-06-15 ENCOUNTER — Other Ambulatory Visit: Payer: Self-pay

## 2013-06-15 MED ORDER — GABAPENTIN 300 MG PO CAPS: 300.0000 mg | ORAL_CAPSULE | Freq: Every day | ORAL | Status: DC

## 2013-07-17 ENCOUNTER — Telehealth: Payer: Self-pay | Admitting: Neurology

## 2013-07-17 ENCOUNTER — Other Ambulatory Visit: Payer: Self-pay

## 2013-07-17 MED ORDER — TOPIRAMATE 25 MG PO TABS: 25.0000 mg | ORAL_TABLET | Freq: Every morning | ORAL | Status: DC

## 2013-07-17 MED ORDER — GABAPENTIN 100 MG PO CAPS: 200.0000 mg | ORAL_CAPSULE | Freq: Two times a day (BID) | ORAL | Status: DC

## 2013-07-17 MED ORDER — TOPIRAMATE 100 MG PO TABS: 200.0000 mg | ORAL_TABLET | Freq: Every evening | ORAL | Status: DC

## 2013-07-17 NOTE — Telephone Encounter (Signed)
Patient needs refill for [facility] [phone]

## 2013-10-13 ENCOUNTER — Other Ambulatory Visit: Payer: Self-pay | Admitting: Dermatology

## 2013-12-11 ENCOUNTER — Encounter: Payer: Self-pay | Admitting: Neurology

## 2013-12-11 ENCOUNTER — Ambulatory Visit (INDEPENDENT_AMBULATORY_CARE_PROVIDER_SITE_OTHER): Payer: Medicare PPO | Admitting: Neurology

## 2013-12-11 VITALS — BP 141/68 | HR 63 | Temp 97.9°F | Ht 63.0 in | Wt 170.0 lb

## 2013-12-11 DIAGNOSIS — R259 Unspecified abnormal involuntary movements: Secondary | ICD-10-CM

## 2013-12-11 DIAGNOSIS — R251 Tremor, unspecified: Secondary | ICD-10-CM

## 2013-12-11 DIAGNOSIS — G43909 Migraine, unspecified, not intractable, without status migrainosus: Secondary | ICD-10-CM

## 2013-12-11 MED ORDER — GABAPENTIN 300 MG PO CAPS: 300.0000 mg | ORAL_CAPSULE | Freq: Every day | ORAL | Status: DC

## 2013-12-11 MED ORDER — CYCLOBENZAPRINE HCL 10 MG PO TABS: 10.0000 mg | ORAL_TABLET | Freq: Every day | ORAL | Status: DC

## 2013-12-11 MED ORDER — TOPIRAMATE 25 MG PO TABS: 25.0000 mg | ORAL_TABLET | Freq: Every morning | ORAL | Status: DC

## 2013-12-11 MED ORDER — VERAPAMIL HCL ER 120 MG PO CP24: 120.0000 mg | ORAL_CAPSULE | Freq: Once | ORAL | Status: DC

## 2013-12-11 MED ORDER — TOPIRAMATE 100 MG PO TABS: 200.0000 mg | ORAL_TABLET | Freq: Every evening | ORAL | Status: DC

## 2013-12-11 MED ORDER — GABAPENTIN 100 MG PO CAPS: 200.0000 mg | ORAL_CAPSULE | Freq: Two times a day (BID) | ORAL | Status: DC

## 2013-12-11 NOTE — Patient Instructions (Signed)
Please remember, common headache triggers are: sleep deprivation, dehydration, overheating, stress, hypoglycemia or skipping meals and blood sugar fluctuations, excessive pain medications or excessive alcohol use or caffeine withdrawal. Some people have food triggers such as aged cheese, orange juice or chocolate, especially dark chocolate, or MSG (monosodium glutamate). Try to avoid these headache triggers as much possible. It may be helpful to keep a headache diary to figure out what makes your headaches worse or brings them on and what alleviates them. Some people report headache onset after exercise but studies have shown that regular exercise may actually prevent headaches from coming. If you have exercise-induced headaches, please make sure that you drink plenty of fluid before and after exercising and that you do not over do it and do not overheat.   

## 2013-12-11 NOTE — Progress Notes (Signed)
Subjective:    Patient ID: Cathy Barnes is a 73 y.o. female.  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 She is unaccompanied today. I last saw her on 06/14/2013, at which time I suggested Imitrex low-dose for abortive treatment of her migraines. I did not make any changes to her preventative treatment.  Today, she reports, that she tried the Imitrex PO, but it did not help. She tried Botox injections in the past through the HA clinic in [facility], which she did not think helped. She has no overt food triggers. She still has about 3 migraines per week, though much less than in the past. She gets blood work every 3 months with Dr. Venia Minks and has her check up in Sep 2015.   I saw her on 12/14/2012 at which time it did not make any medication changes. I ordered a repeat head CT. She had head CT without contrast on 12/26/2012: Equivocal CT head (without) demonstrating mild chronic small vessel ischemic disease. No acute findings. No hydrocephalus. No change from CTA on 05/06/10.   She lost her son-in-law at age 73 to an MI. She has had her eyes checked. She had cataract surgeries in 2011 (10/01/09 and 10/28/09). She is borderline diabetic, diet controlled. She is on two cholesterol medications. For HA prevention, she has been on gabapentin (200 mg - 200 mg - 300 mg), topiramate (25 mg - 200 mg), atenolol 50 mg daily, Verapamil ER 120 mg daily, and Flexeril. She was Imitrex injections years ago, for her severe migraines. She has been mildly depressed and denied SI/HI.  I first met her on 06/15/2012, at which time and felt she was stable. She had no significant tremor at the time. Her headaches were well controlled. She had clinical  evidence of bilateral carpal tunnel syndrome right more than left for which I asked her to continue using wrist splints. Her last EMG and nerve conduction velocity tests from June 2013 was reported as normal. She was advised to continue with gabapentin, she was also on vitamin D, Crestor, Ambien, Topamax, Xanax, losartan, hydrocodone as needed, verapamil, Maxzide, Cathy Barnes, baby aspirin, levothyroxine, cyclobenzaprine, gabapentin, atenolol.  CT head in January 2012 showed no intracranial abnormality. 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  Headaches are worse by bending over or sneezing. She sleeps with a head of bed elevated because of reflux. She is on cyclobenzaprine 10 mg nightly and gabapentin 200 mg in the morning and noon and 300 mg at night.  She previously followed with Dr. Morene Antu and was last seen by him in October 2013 at which time he asked her to increase gabapentin to 300 mg 3 times a day but she could not tolerate it as she was too sedated and too nauseated. She is on [email or web address] for memory training. She is on Topamax for headache prevention and her tremors. She has been on Topamax for the past 10 years when she went to [facility]. She takes 25 mg in AM and 200 mg qHS. She has done biofeedback and has tried Imitrex injections for her migraine headaches.   Her Past Medical History Is Significant For: Past Medical History  Diagnosis Date  . Migraine, unspecified, without  mention of intractable migraine without mention of status migrainosus 12/14/2012    Her Past Surgical History Is Significant For: Past Surgical History  Procedure Laterality Date  . Partial hysterectomy      Her Family History Is Significant For: History reviewed. No pertinent family history.  Her Social History Is Significant For: History   Social History  . Marital Status: Divorced    Spouse Name: N/A    Number of Children: 1  . Years  of Education: college   Occupational History  . book keeping Other    [employer]   Social History Main Topics  . Smoking status: Never Smoker   . Smokeless tobacco: Never Used  . Alcohol Use: No  . Drug Use: No  . Sexual Activity: None   Other Topics Concern  . None   Social History Narrative   Patient is right handed, resides alone.    Her Allergies Are:  No Known Allergies:   Her Current Medications Are:  Outpatient Encounter Prescriptions as of 12/11/2013  Medication Sig  . ALPRAZolam (XANAX) 0.25 MG tablet Take 0.25 mg by mouth once. Take two tablets once at bedtime  . aspirin 81 MG tablet Take 81 mg by mouth daily.  [name] atenolol (TENORMIN) 50 MG tablet Take 50 mg by mouth every morning.  [name] BAYER CONTOUR TEST test strip   . CRESTOR 10 MG tablet Take 10 mg by mouth once.  . cyclobenzaprine (FLEXERIL) 10 MG tablet Take 10 mg by mouth once.  [name] FLUZONE HIGH-DOSE injection Inject 180 mLs into the muscle once.  . gabapentin (NEURONTIN) 100 MG capsule Take 2 capsules (200 mg total) by mouth 2 (two) times daily. Take 2 tablet in am and 2 tablet at lunch.  . gabapentin (NEURONTIN) 300 MG capsule Take 1 capsule (300 mg total) by mouth at bedtime.  [name] Hydrocodone-Acetaminophen 5-300 MG TABS   . levothyroxine (SYNTHROID, LEVOTHROID) 75 MCG tablet Take 75 mcg by mouth once.  [name] losartan (COZAAR) 50 MG tablet Take 50 mg by mouth once.  . potassium chloride SA (K-DUR,KLOR-CON) 20 MEQ tablet Take 20 mEq by mouth 2 (two) times daily.  [name] topiramate (TOPAMAX) 100 MG tablet Take 2 tablets (200 mg total) by mouth every evening.  . topiramate (TOPAMAX) 25 MG tablet Take 1 tablet (25 mg total) by mouth every morning.  . triamterene-hydrochlorothiazide (MAXZIDE) 75-50 MG per tablet Take 75 tablets by mouth once. Take 1/2 tablet once a day  . verapamil (VERELAN PM) 120 MG 24 hr capsule Take 120 mg by mouth once.  . Vitamin D, Ergocalciferol, (DRISDOL) 50000 UNITS CAPS capsule Take 50,000  Units by mouth every 30 (thirty) days.  [name] ZETIA 10 MG tablet Take 10 mg by mouth once.  [name] zolpidem (AMBIEN) 10 MG tablet Take 10 mg by mouth once.  . [DISCONTINUED] SUMAtriptan (IMITREX) 25 MG tablet Take 1 tablet (25 mg total) by mouth every 2 (two) hours as needed for migraine or headache. May repeat in 2 hours if headache persists or recurs. Take no more than 2 pills in 24 hours and no more than 3 pills a week.  :  Review of Systems:  Out of a complete 14 point review of systems, all are reviewed and negative with the exception of these symptoms as listed below:   Review of Systems  HENT:       Ringing in ears  Eyes:       Dry eyes  Neurological: Positive for tremors and headaches.  Hard to go to sleep    Objective:  Neurologic Exam  Physical Exam Physical Examination:   Filed Vitals:   12/11/13 1221  BP: 141/68  Pulse: 63  Temp: 97.9 F (36.6 C)   General Examination: The patient is a very pleasant 73 y.o. female in no acute distress. She appears well-developed and well-nourished and well groomed. She is in good spirits today.   HEENT exam: Normocephalic, atraumatic, pupils are equal, round and reactive to light and accommodation. Extraocular tracking is good with normal smooth pursuit and no nystagmus noted. Face is symmetric with normal facial animation. Speech is clear. She has no lip, neck or jaw tremor. No voice tremor. Hearing is grossly intact. Neck is supple with full range of motion.  Chest is clear to auscultation without wheezing or rhonchi noted.  Abdomen is soft, nontender with normal bowel sounds appreciated.  Heart sounds are normal with no murmurs, rubs or gallops noted. She has no pitting edema in the distal lower extremities. Skin is warm and dry.  She has evidence of a beginning Dupuytren's contracture on the left, unchanged. She has no significant hand tremors, in particular no resting or postural or action tremor in both upper extremities. She has  intact fine motor skills. She has normal finger taps. Finger to nose testing is normal. Neurologically: Mental status: The patient is awake, alert and oriented in all 4 spheres. Her memory, language, attention and language are appropriate. Remote and recent memories seem to be intact. Cranial nerves are as described above. Motor exam reveals normal bulk, strength and tone, reflexes are 1+ throughout. Sensory exam is intact to light touch, pinprick, vibration and temperature sense. Gait, station and balance are slightly insecure but otherwise good. Romberg testing shows minimal swaying but no corrective steps. She is able to perform tandem walk with slight difficulty. She needs no assistance. She turns in 3 steps.  Assessment and Plan:   In summary, Cathy Barnes is a very pleasant 73 year old female with a history of tremors and migraine headaches who presents for followup consultation. While over the course of years, she has had significant improvement of her debilitating severe migraines, she still has intermittent migraines, but overall feels stable. As far as migraine prevention she is on multiple medications and we tried Imitrex PO, but it did not help. Her recent [place] showed stable findings. Her exam has remained stable. Her tremor is not an issue currently, and I again talked to her about potential headache triggers such as dehydration, overheating, bright lights, skipping meals, stress, sleep deprivation again today. I suggested that she followup with me in 6 months, sooner if the need arises. She is encouraged to call with any interim questions, concerns, problems or updates. She was in agreement.

## 2014-02-14 LAB — LIPID PANEL
Cholesterol: 119 mg/dL (ref 0–200)
HDL: 59 mg/dL (ref 35–70)
LDL CALC: 48 mg/dL
Triglycerides: 60 mg/dL (ref 40–160)

## 2014-02-14 LAB — CBC AND DIFFERENTIAL
HEMATOCRIT: 38 % (ref 36–46)
Hemoglobin: 12.9 g/dL (ref 12.0–16.0)
PLATELETS: 228 10*3/uL (ref 150–399)
WBC: 10.2 10^3/mL

## 2014-04-30 ENCOUNTER — Encounter: Payer: Self-pay | Admitting: Gastroenterology

## 2014-04-30 ENCOUNTER — Ambulatory Visit: Payer: Self-pay | Admitting: Family Medicine

## 2014-05-24 ENCOUNTER — Telehealth: Payer: Self-pay | Admitting: Neurology

## 2014-05-24 NOTE — Telephone Encounter (Signed)
Called and confirmed new template change appointment with patient

## 2014-06-18 ENCOUNTER — Ambulatory Visit (INDEPENDENT_AMBULATORY_CARE_PROVIDER_SITE_OTHER): Payer: Medicare PPO | Admitting: Neurology

## 2014-06-18 ENCOUNTER — Encounter: Payer: Self-pay | Admitting: Neurology

## 2014-06-18 VITALS — BP 124/69 | HR 67 | Temp 98.4°F | Resp 16 | Ht 63.5 in | Wt 172.0 lb

## 2014-06-18 DIAGNOSIS — G43009 Migraine without aura, not intractable, without status migrainosus: Secondary | ICD-10-CM | POA: Diagnosis not present

## 2014-06-18 MED ORDER — SUMATRIPTAN SUCCINATE 6 MG/0.5ML ~~LOC~~ SOAJ: 6.0000 mg | Freq: Once | SUBCUTANEOUS | Status: DC | PRN

## 2014-06-18 NOTE — Patient Instructions (Addendum)
We will continue with the migraine prevention with topamax and gabapentin.   Try to come off of hydrocodone.   We can try imitrex injections.   Please remember, common headache triggers are: sleep deprivation, dehydration, overheating, stress, hypoglycemia or skipping meals and blood sugar fluctuations, excessive pain medications or excessive alcohol use or caffeine withdrawal. Some people have food triggers such as aged cheese, orange juice or chocolate, especially dark chocolate, or MSG (monosodium glutamate). Try to avoid these headache triggers as much possible. It may be helpful to keep a headache diary to figure out what makes your headaches worse or brings them on and what alleviates them. Some people report headache onset after exercise but studies have shown that regular exercise may actually prevent headaches from coming. If you have exercise-induced headaches, please make sure that you drink plenty of fluid before and after exercising and that you do not over do it and do not overheat.

## 2014-06-18 NOTE — Progress Notes (Signed)
Subjective:    Patient ID: Cathy Barnes is a 74 y.o. female.  HPI     Interim history:  Cathy Barnes is a 74 year old right-handed woman with an underlying medical history of migraines, essential tremor, atypical facial pain, ventriculomegaly (no clinical evidence of NPH), who presents for followup consultation of her migraine headaches. She is unaccompanied today. I last saw her on 12/11/2013, at which time she reported that Imitrex by mouth was not helpful. She had tried Botox injections in the past through the headache clinic in [facility] which did not help. She did not have any overt food triggers for migraines. She reported having 3 migraines per week which were actually less than in the distant past.   Today, she reports that she takes up to 6 pills of hydrocodone per week, no more than 2 pills a day. In the last 3 weeks, she has has intermittent thigh cramps and burning in the front of both thighs, especially at night. She has blood work next week with Dr. Venia Minks. With the headaches, she feels stable. She has no involuntary movements, no jerking in her sleep, no RLS symptoms. She says that years ago she was diagnosed with MS by Dr. Erling Cruz. She was never on medication for MS. She says she had the low end of the spectrum. I will have to look into her old records. As far as Imitrex, the oral Imitrex did not help but years ago she had success with Imitrex injections. She would like to try those again.   I saw her on 06/14/2013, at which time I suggested Imitrex low-dose for abortive treatment of her migraines. I did not make any changes to her preventative treatment.  I saw her on 12/14/2012 at which time it did not make any medication changes. I ordered a repeat head CT. She had head CT without contrast on 12/26/2012: Equivocal CT head (without) demonstrating mild chronic small vessel ischemic disease. No acute findings. No hydrocephalus. No change from CTA on 05/06/10.   She lost her  son-in-law at age 7 to an MI. She has had her eyes checked. She had cataract surgeries in 2011 (10/01/09 and 10/28/09). She is borderline diabetic, diet controlled. She is on two cholesterol medications. For HA prevention, she has been on gabapentin (200 mg - 200 mg - 300 mg), topiramate (25 mg - 200 mg), atenolol 50 mg daily, Verapamil ER 120 mg daily, and Flexeril. She was Imitrex injections years ago, for her severe migraines. She has been mildly depressed and denied SI/HI.   I first met her on 06/15/2012, at which time and felt she was stable. She had no significant tremor at the time. Her headaches were well controlled. She had clinical evidence of bilateral carpal tunnel syndrome right more than left for which I asked her to continue using wrist splints. Her last EMG and nerve conduction velocity tests from June 2013 was reported as normal. She was advised to continue with gabapentin, she was also on vitamin D, Crestor, Ambien, Topamax, Xanax, losartan, hydrocodone as needed, verapamil, Maxzide, Cynthia, baby aspirin, levothyroxine, cyclobenzaprine, gabapentin, atenolol.   CT head in January 2012 showed no intracranial abnormality. 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   Headaches are worse by bending over or sneezing. She sleeps with a head of bed elevated because of reflux. She is on cyclobenzaprine 10 mg nightly and gabapentin 200 mg in the morning and noon and 300 mg at  night.   She previously followed with Dr. Morene Antu and was last seen by him in October 2013 at which time he asked her to increase gabapentin to 300 mg 3 times a day but she could not tolerate it as she was too sedated and too nauseated. She is on [email or web address] for memory training. She is on Topamax for headache prevention and her tremors. She has been on Topamax for the past 10 years when she went to [facility] She takes 25 mg in AM and 200 mg qHS. She has done  biofeedback and has tried Imitrex injections for her migraine headaches.    Her Past Medical History Is Significant For: Past Medical History  Diagnosis Date  . Migraine, unspecified, without mention of intractable migraine without mention of status migrainosus 12/14/2012    Her Past Surgical History Is Significant For: Past Surgical History  Procedure Laterality Date  . Partial hysterectomy      Her Family History Is Significant For: Family History  Problem Relation Age of Onset  . Colon cancer Mother   . Hypertension Mother   . Colon cancer Father   . Heart disease Father   . Hypertension Father     Her Social History Is Significant For: History   Social History  . Marital Status: Divorced    Spouse Name: N/A  . Number of Children: 1  . Years of Education: college   Occupational History  . book keeping Other    [employer]   Social History Main Topics  . Smoking status: Never Smoker   . Smokeless tobacco: Never Used  . Alcohol Use: 0.0 oz/week    0 Standard drinks or equivalent per week     Comment: Rare occation   . Drug Use: No  . Sexual Activity: Not on file   Other Topics Concern  . None   Social History Narrative   Patient is right handed, resides alone.    Her Allergies Are:  No Known Allergies:   Her Current Medications Are:  Outpatient Encounter Prescriptions as of 06/18/2014  Medication Sig  . ALPRAZolam (XANAX) 0.25 MG tablet Take 0.25 mg by mouth once. Take two tablets once at bedtime  . aspirin 81 MG tablet Take 81 mg by mouth daily.  [name] atenolol (TENORMIN) 50 MG tablet Take 50 mg by mouth every morning.  [name] BAYER CONTOUR TEST test strip   . CRESTOR 10 MG tablet Take 10 mg by mouth once.  . cyclobenzaprine (FLEXERIL) 10 MG tablet Take 1 tablet (10 mg total) by mouth at bedtime.  [name] FLUZONE HIGH-DOSE injection Inject 180 mLs into the muscle once.  . gabapentin (NEURONTIN) 100 MG capsule Take 2 capsules (200 mg total) by mouth 2 (two)  times daily. Take 2 tablet in am and 2 tablet at lunch.  . gabapentin (NEURONTIN) 300 MG capsule Take 1 capsule (300 mg total) by mouth at bedtime.  [name] Hydrocodone-Acetaminophen 5-300 MG TABS   . levothyroxine (SYNTHROID, LEVOTHROID) 88 MCG tablet   . losartan (COZAAR) 50 MG tablet Take 50 mg by mouth once.  . potassium chloride SA (K-DUR,KLOR-CON) 20 MEQ tablet Take 20 mEq by mouth 2 (two) times daily.  [name] topiramate (TOPAMAX) 100 MG tablet Take 2 tablets (200 mg total) by mouth every evening.  . topiramate (TOPAMAX) 25 MG tablet Take 1 tablet (25 mg total) by mouth every morning.  . triamterene-hydrochlorothiazide (MAXZIDE) 75-50 MG per tablet Take 75 tablets by mouth once. Take 1/2 tablet once a  day  . verapamil (VERELAN PM) 120 MG 24 hr capsule Take 1 capsule (120 mg total) by mouth once.  . Vitamin D, Ergocalciferol, (DRISDOL) 50000 UNITS CAPS capsule Take 50,000 Units by mouth every 30 (thirty) days.  [name] ZETIA 10 MG tablet Take 10 mg by mouth once.  [name] zolpidem (AMBIEN) 10 MG tablet Take 10 mg by mouth once.  . [DISCONTINUED] levothyroxine (SYNTHROID, LEVOTHROID) 75 MCG tablet Take 75 mcg by mouth once.  :  Review of Systems:  Out of a complete 14 point review of systems, all are reviewed and negative with the exception of these symptoms as listed below:   Review of Systems  Musculoskeletal:       "Cramping in both thighs while sleeping and sitting"    Objective:  Neurologic Exam  Physical Exam Physical Examination:   Filed Vitals:   06/18/14 1413  BP: 124/69  Pulse: 67  Temp: 98.4 F (36.9 C)  Resp: 16   General Examination: The patient is a very pleasant 74 y.o. female in no acute distress. She appears well-developed and well-nourished and well groomed.   HEENT exam: Normocephalic, atraumatic, pupils are equal, round and reactive to light and accommodation. Extraocular tracking is good with normal smooth pursuit and no nystagmus noted. Face is symmetric with normal facial  animation. Speech is clear. She has no lip, neck or jaw tremor. No voice tremor. Hearing is grossly intact. Neck is supple with full range of motion.  Chest is clear to auscultation without wheezing or rhonchi noted.  Abdomen is soft, nontender with normal bowel sounds appreciated.  Heart sounds are normal with no murmurs, rubs or gallops noted. She has no pitting edema in the distal lower extremities. Skin is warm and dry.  She has evidence of a beginning Dupuytren's contracture on the left, unchanged. She has no significant hand tremors, in particular no resting or postural or action tremor in both upper extremities. She has intact fine motor skills. She has normal finger taps. Finger to nose testing is normal, heel to shin is unremarkable. She has no abnormal involuntary movements such as myoclonus. Neurologically: Mental status: The patient is awake, alert and oriented in all 4 spheres. Her memory, language, attention and language are appropriate. Remote and recent memories seem to be intact. Cranial nerves are as described above. Motor exam reveals normal bulk, strength and tone, reflexes are 1+ throughout. Toes are downgoing bilaterally. Sensory exam is intact to light touch, pinprick, vibration and temperature sense. Gait, station and balance are unremarkable. Romberg testing is negative. Tandem walk is fine.  Assessment and Plan:   In summary, LYGIA OLAES is a very pleasant 74 year old female with a history of tremors and migraine headaches who presents for followup consultation. While over the course of years, she has had significant improvement of her debilitating  and severe migraines, she still has intermittent migraines, but overall feels stable. As far as migraine prevention she is on multiple medications and we tried Imitrex PO, but it did not help.  she had success with Imitrex injections years ago and I will prescribe Imitrex injections for as needed use. She is advised to try to phase  out of hydrocodone altogether if possible. She takes up to 6 pills a week. Head CT showed stable findings. Years ago she was diagnosed with MS by Dr. Erling Cruz she states. I will look into her old records. She was never placed on disease modifying medication as I understand. Her exam is stable for  me. I reassured her in that regard. She reports thigh cramping which could be electrolyte disturbance or a side effect from cholesterol medication. She is advised to keep her appointment with her primary care next week. She did not need any refills of gabapentin her Topamax today. I suggested she continue with the current doses of gabapentin and Topamax and we will try sumatriptan injections as needed for migraine headaches.  I suggested that she followup with me in 6 months, sooner if the need arises. She is encouraged to call with any interim questions, concerns, problems or updates. She was in agreement.

## 2014-06-25 ENCOUNTER — Ambulatory Visit: Payer: Self-pay | Admitting: Family Medicine

## 2014-06-25 LAB — BASIC METABOLIC PANEL
BUN: 23 mg/dL — AB (ref 4–21)
Creatinine: 1 mg/dL (ref 0.5–1.1)
GLUCOSE: 97 mg/dL
POTASSIUM: 4 mmol/L (ref 3.4–5.3)
SODIUM: 139 mmol/L (ref 137–147)

## 2014-06-25 LAB — TSH: TSH: 1.21 u[IU]/mL (ref 0.41–5.90)

## 2014-06-25 LAB — HEMOGLOBIN A1C: HEMOGLOBIN A1C: 6 % (ref 4.0–6.0)

## 2014-06-25 LAB — HEPATIC FUNCTION PANEL
ALT: 17 U/L (ref 7–35)
AST: 21 U/L (ref 13–35)

## 2014-10-15 ENCOUNTER — Other Ambulatory Visit: Payer: Self-pay | Admitting: Family Medicine

## 2014-10-15 DIAGNOSIS — G4700 Insomnia, unspecified: Secondary | ICD-10-CM

## 2014-10-16 DIAGNOSIS — G4700 Insomnia, unspecified: Secondary | ICD-10-CM | POA: Insufficient documentation

## 2014-10-16 NOTE — Telephone Encounter (Signed)
Please call in.  Thanks.   

## 2014-10-19 ENCOUNTER — Other Ambulatory Visit: Payer: Self-pay | Admitting: Family Medicine

## 2014-10-19 DIAGNOSIS — E785 Hyperlipidemia, unspecified: Secondary | ICD-10-CM

## 2014-11-02 ENCOUNTER — Other Ambulatory Visit: Payer: Self-pay | Admitting: Family Medicine

## 2014-11-02 MED ORDER — HYDROCODONE-ACETAMINOPHEN 5-300 MG PO TABS: 1.0000 | ORAL_TABLET | Freq: Two times a day (BID) | ORAL | Status: DC | PRN

## 2014-11-02 NOTE — Telephone Encounter (Signed)
Printed please notify patient. Thanks.   

## 2014-11-02 NOTE — Telephone Encounter (Signed)
Pt contacted office for refill request on the following medications:  Hydrocodone-Acetaminophen 5-300 MG.  EO#[phone]/GP

## 2014-11-02 NOTE — Telephone Encounter (Signed)
Left message advising pt.   Thanks,   -Daymian Lill  

## 2014-12-05 ENCOUNTER — Telehealth: Payer: Self-pay

## 2014-12-05 NOTE — Telephone Encounter (Signed)
Patient to come in for f/u 8/29 to see Dr. [name] for migraine f/u. Dr. Guadelupe Sabin schedule is overbooked that day. I spoke to patient and she was ok to see [facility] NP instead. I moved appt.

## 2014-12-06 ENCOUNTER — Encounter: Payer: Self-pay | Admitting: Gastroenterology

## 2014-12-11 ENCOUNTER — Telehealth: Payer: Self-pay | Admitting: Neurology

## 2014-12-11 MED ORDER — GABAPENTIN 300 MG PO CAPS: 300.0000 mg | ORAL_CAPSULE | Freq: Every day | ORAL | Status: DC

## 2014-12-11 MED ORDER — GABAPENTIN 100 MG PO CAPS: 200.0000 mg | ORAL_CAPSULE | Freq: Two times a day (BID) | ORAL | Status: DC

## 2014-12-11 NOTE — Telephone Encounter (Signed)
Patient called requesting refill on gabapentin (NEURONTIN) 300 MG capsule. States the pharmacy has sent request to [place] x2. If any questions please call patient at [phone].

## 2014-12-11 NOTE — Telephone Encounter (Signed)
Unfortunately, we have not gotten the fax from pharmacy.  Refill has been sent.  Patient has follow up appt scheduled.

## 2014-12-13 ENCOUNTER — Other Ambulatory Visit: Payer: Self-pay

## 2014-12-13 DIAGNOSIS — M722 Plantar fascial fibromatosis: Secondary | ICD-10-CM | POA: Diagnosis not present

## 2014-12-13 DIAGNOSIS — M25571 Pain in right ankle and joints of right foot: Secondary | ICD-10-CM | POA: Diagnosis not present

## 2014-12-13 MED ORDER — CYCLOBENZAPRINE HCL 10 MG PO TABS: 10.0000 mg | ORAL_TABLET | Freq: Every day | ORAL | Status: DC

## 2014-12-13 NOTE — Telephone Encounter (Signed)
Last prescribed 12/11/2013 at [place]

## 2014-12-17 ENCOUNTER — Encounter: Payer: Self-pay | Admitting: Adult Health

## 2014-12-17 ENCOUNTER — Ambulatory Visit (INDEPENDENT_AMBULATORY_CARE_PROVIDER_SITE_OTHER): Payer: Medicare PPO | Admitting: Adult Health

## 2014-12-17 ENCOUNTER — Ambulatory Visit: Payer: Medicare PPO | Admitting: Neurology

## 2014-12-17 VITALS — BP 133/65 | HR 60 | Ht 63.0 in | Wt 171.0 lb

## 2014-12-17 DIAGNOSIS — G43009 Migraine without aura, not intractable, without status migrainosus: Secondary | ICD-10-CM | POA: Diagnosis not present

## 2014-12-17 NOTE — Patient Instructions (Signed)
Continue medication If your symptoms worsen or you develop new symptoms please let us know.

## 2014-12-17 NOTE — Progress Notes (Addendum)
[name] DOB: Jul 03, 1940  REASON FOR VISIT: follow up- migraines, tremor HISTORY FROM: patient  HISTORY OF PRESENT ILLNESS: Cathy Barnes is a 74 year old female with a history of migraines and essential tremor. She returns today for follow-up. She is currently taken verapamil 120 mg daily, Topamax 25 mg in the morning and 200 mg in the evening, Flexeril 10 mg at bedtime, gabapentin 200 mg in a.m. and at lunch and 300 mg at bedtime. She also uses sumatriptan injections for acute therapy. She reports that her headaches are "the best that it's ever been." She states that she currently has 4-5 headaches a week however she describes her headaches as dull. They are usually located in the temporal region bilaterally. She denies nausea and vomiting. Denies photophobia or phonophobia. She states that she rarely has to use the sumatriptan injections. However she states that when she does use them it resolves her headaches. The patient has recently been placed on prednisone for tendinitis, heel spurs and plantar fasciitis in the right foot. She states that since she started the prednisone Dosepak she has not had a headache. She also states that she has decreased her use of hydrocodone. She states that last week she only used the medication once. Overall she feels that she is doing well. She returns today for an evaluation.   HISTORY 06/18/14 [name]): Cathy. Barnes is a 74 year old right-handed woman with an underlying medical history of migraines, essential tremor, atypical facial pain, ventriculomegaly (no clinical evidence of NPH), who presents for followup consultation of her migraine headaches. She is unaccompanied today. I last saw her on 12/11/2013, at which time she reported that Imitrex by mouth was not helpful. She had tried Botox injections in the past through the headache clinic in [facility] which did not help. She did not have any overt food triggers for migraines. She reported having 3  migraines per week which were actually less than in the distant past.   Today, she reports that she takes up to 6 pills of hydrocodone per week, no more than 2 pills a day. In the last 3 weeks, she has has intermittent thigh cramps and burning in the front of both thighs, especially at night. She has blood work next week with Dr. Venia Minks. With the headaches, she feels stable. She has no involuntary movements, no jerking in her sleep, no RLS symptoms. She says that years ago she was diagnosed with Cathy by Dr. Erling Cruz. She was never on medication for Cathy. She says she had the low end of the spectrum. I will have to look into her old records. As far as Imitrex, the oral Imitrex did not help but years ago she had success with Imitrex injections. She would like to try those again.   I saw her on 06/14/2013, at which time I suggested Imitrex low-dose for abortive treatment of her migraines. I did not make any changes to her preventative treatment.  I saw her on 12/14/2012 at which time it did not make any medication changes. I ordered a repeat head CT. She had head CT without contrast on 12/26/2012: Equivocal CT head (without) demonstrating mild chronic small vessel ischemic disease. No acute findings. No hydrocephalus. No change from CTA on 05/06/10.   She lost her son-in-law at age 50 to an MI. She has had her eyes checked. She had cataract surgeries in 2011 (10/01/09 and 10/28/09). She is borderline diabetic, diet controlled. She is on two cholesterol medications. For HA prevention, she  has been on gabapentin (200 mg - 200 mg - 300 mg), topiramate (25 mg - 200 mg), atenolol 50 mg daily, Verapamil ER 120 mg daily, and Flexeril. She was Imitrex injections years ago, for her severe migraines. She has been mildly depressed and denied SI/HI.  I first met her on 06/15/2012, at which time and felt she was stable. She had no significant tremor at the time. Her headaches were well controlled. She had clinical evidence of  bilateral carpal tunnel syndrome right more than left for which I asked her to continue using wrist splints. Her last EMG and nerve conduction velocity tests from June 2013 was reported as normal. She was advised to continue with gabapentin, she was also on vitamin D, Crestor, Ambien, Topamax, Xanax, losartan, hydrocodone as needed, verapamil, Maxzide, Cynthia, baby aspirin, levothyroxine, cyclobenzaprine, gabapentin, atenolol.  CT head in January 2012 showed no intracranial abnormality. 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  Headaches are worse by bending over or sneezing. She sleeps with a head of bed elevated because of reflux. She is on cyclobenzaprine 10 mg nightly and gabapentin 200 mg in the morning and noon and 300 mg at night.  She previously followed with Dr. Morene Antu and was last seen by him in October 2013 at which time he asked her to increase gabapentin to 300 mg 3 times a day but she could not tolerate it as she was too sedated and too nauseated. She is on [email or web address] for memory training. She is on Topamax for headache prevention and her tremors. She has been on Topamax for the past 10 years when she went to [facility]. She takes 25 mg in AM and 200 mg qHS. She has done biofeedback and has tried Imitrex injections for her migraine headaches  REVIEW OF SYSTEMS: Out of a complete 14 system review of symptoms, the patient complains only of the following symptoms, and all other reviewed systems are negative.  Headache, ringing in ears  ALLERGIES: Allergies  Allergen Reactions  . Shellfish Allergy     Throat closes, rash    HOME MEDICATIONS: Outpatient Prescriptions Prior to Visit  Medication Sig Dispense Refill  . ALPRAZolam (XANAX) 0.25 MG tablet Take 0.25 mg by mouth once. Take two tablets once at bedtime    . aspirin 81 MG tablet Take 81 mg by mouth daily.    Cathy Barnes atenolol (TENORMIN) 50 MG tablet Take 50 mg  by mouth every morning.    Cathy Barnes BAYER CONTOUR TEST test strip     . CRESTOR 10 MG tablet Take 10 mg by mouth once.    . cyclobenzaprine (FLEXERIL) 10 MG tablet Take 1 tablet (10 mg total) by mouth at bedtime. 30 tablet 0  . FLUZONE HIGH-DOSE injection Inject 180 mLs into the muscle once.    . gabapentin (NEURONTIN) 100 MG capsule Take 2 capsules (200 mg total) by mouth 2 (two) times daily. Take 2 tablet in am and 2 tablet at lunch. 360 capsule 0  . gabapentin (NEURONTIN) 300 MG capsule Take 1 capsule (300 mg total) by mouth at bedtime. 90 capsule 0  . Hydrocodone-Acetaminophen 5-300 MG TABS Take 1 tablet by mouth 2 (two) times daily as needed. 60 each 0  . levothyroxine (SYNTHROID, LEVOTHROID) 88 MCG tablet   0  . losartan (COZAAR) 50 MG tablet Take 50 mg by mouth once.    . potassium chloride SA (K-DUR,KLOR-CON) 20 MEQ tablet Take 20 mEq by  mouth 2 (two) times daily.    . SUMAtriptan 6 MG/0.5ML SOAJ Inject 6 mg into the skin once as needed. 2 cartridge 1  . topiramate (TOPAMAX) 100 MG tablet Take 2 tablets (200 mg total) by mouth every evening. 180 tablet 3  . topiramate (TOPAMAX) 25 MG tablet Take 1 tablet (25 mg total) by mouth every morning. 90 tablet 3  . triamterene-hydrochlorothiazide (MAXZIDE) 75-50 MG per tablet Take 75 tablets by mouth once. Take 1/2 tablet once a day    . verapamil (VERELAN PM) 120 MG 24 hr capsule Take 1 capsule (120 mg total) by mouth once. 90 capsule 3  . Vitamin D, Ergocalciferol, (DRISDOL) 50000 UNITS CAPS capsule Take 50,000 Units by mouth every 30 (thirty) days.    Cathy Barnes ZETIA 10 MG tablet take 1 tablet by mouth at bedtime 30 tablet 3  . zolpidem (AMBIEN) 10 MG tablet take 1 tablet by mouth at bedtime 30 tablet 5   No facility-administered medications prior to visit.    PAST MEDICAL HISTORY: Past Medical History  Diagnosis Date  . Migraine, unspecified, without mention of intractable migraine without mention of status migrainosus 12/14/2012    PAST SURGICAL  HISTORY: Past Surgical History  Procedure Laterality Date  . Partial hysterectomy      FAMILY HISTORY: Family History  Problem Relation Age of Onset  . Colon cancer Mother   . Hypertension Mother   . Colon cancer Father   . Heart disease Father   . Hypertension Father     SOCIAL HISTORY: Social History   Social History  . Marital Status: Divorced    Spouse Name: N/A  . Number of Children: 1  . Years of Education: college   Occupational History  . book keeping Other    [employer]   Social History Main Topics  . Smoking status: Never Smoker   . Smokeless tobacco: Never Used  . Alcohol Use: 0.0 oz/week    0 Standard drinks or equivalent per week     Comment: Rare occation   . Drug Use: No  . Sexual Activity: Not on file   Other Topics Concern  . Not on file   Social History Narrative   Patient is right handed, resides alone.      PHYSICAL EXAM  Filed Vitals:   12/17/14 1356  Height: 5' 3"  (1.6 m)  Weight: 171 lb (77.565 kg)   Body mass index is 30.3 kg/(m^2).  Generalized: Well developed, in no acute distress   Neurological examination  Mentation: Alert oriented to time, place, history taking. Follows all commands speech and language fluent Cranial nerve II-XII: Pupils were equal round reactive to light. Extraocular movements were full, visual field were full on confrontational test. Facial sensation and strength were normal. Uvula tongue midline. Head turning and shoulder shrug  were normal and symmetric. Motor: The motor testing reveals 5 over 5 strength of all 4 extremities. Good symmetric motor tone is noted throughout.  Sensory: Sensory testing is intact to soft touch on all 4 extremities. No evidence of extinction is noted.  Coordination: Cerebellar testing reveals good finger-nose-finger and heel-to-shin bilaterally.  Gait and station: Gait is normal. Tandem gait is normal. Romberg is negative. No drift is seen.  Reflexes: Deep tendon  reflexes are symmetric and normal bilaterally.   DIAGNOSTIC DATA (LABS, IMAGING, TESTING) - I reviewed patient records, labs, notes, testing and imaging myself where available.       ASSESSMENT AND PLAN 74 y.o. year old female  has a past medical history of Migraine, unspecified, without mention of intractable migraine without mention of status migrainosus (12/14/2012). here with:  1. Migraine headaches  Overall the patient is doing better. She was placed on prednisone Dosepak and has not had a headache since starting this. I explained that prednisone should not be taken long-term due to adverse effects. However we do use it for acute therapy for migraines to break the cycle- this was explained to the patient. She expressed understanding. The patient will continue on Flexeril, Topamax, gabapentin, verapamil and sumatriptan injections. Patient advised that if her headache frequency or severity worsens she should let us know. She verbalized understanding. She will follow-up in 6 months or sooner if needed.   Ward Givens, MSN, NP-C 12/17/2014, 1:57 PM [facility] [address] [place], [place] [address] [phone]  I reviewed the above note and documentation by the Nurse Practitioner and agree with the history, physical exam, assessment and plan as outlined above. I was immediately available for face-to-face consultation. Star Age, MD, PhD [facility])

## 2014-12-18 ENCOUNTER — Other Ambulatory Visit: Payer: Self-pay

## 2014-12-18 MED ORDER — TOPIRAMATE 25 MG PO TABS: 25.0000 mg | ORAL_TABLET | Freq: Every morning | ORAL | Status: DC

## 2014-12-18 MED ORDER — TOPIRAMATE 100 MG PO TABS: 200.0000 mg | ORAL_TABLET | Freq: Every evening | ORAL | Status: DC

## 2014-12-25 ENCOUNTER — Other Ambulatory Visit: Payer: Self-pay | Admitting: Family Medicine

## 2014-12-25 DIAGNOSIS — R269 Unspecified abnormalities of gait and mobility: Secondary | ICD-10-CM | POA: Insufficient documentation

## 2014-12-25 DIAGNOSIS — K635 Polyp of colon: Secondary | ICD-10-CM | POA: Insufficient documentation

## 2014-12-25 DIAGNOSIS — E559 Vitamin D deficiency, unspecified: Secondary | ICD-10-CM | POA: Insufficient documentation

## 2014-12-25 DIAGNOSIS — M797 Fibromyalgia: Secondary | ICD-10-CM | POA: Insufficient documentation

## 2014-12-25 DIAGNOSIS — M1990 Unspecified osteoarthritis, unspecified site: Secondary | ICD-10-CM | POA: Insufficient documentation

## 2014-12-25 DIAGNOSIS — E876 Hypokalemia: Secondary | ICD-10-CM | POA: Insufficient documentation

## 2014-12-25 DIAGNOSIS — G4700 Insomnia, unspecified: Secondary | ICD-10-CM | POA: Insufficient documentation

## 2014-12-25 DIAGNOSIS — G910 Communicating hydrocephalus: Secondary | ICD-10-CM | POA: Insufficient documentation

## 2014-12-25 DIAGNOSIS — G35 Multiple sclerosis: Secondary | ICD-10-CM | POA: Insufficient documentation

## 2014-12-25 DIAGNOSIS — R739 Hyperglycemia, unspecified: Secondary | ICD-10-CM | POA: Insufficient documentation

## 2014-12-25 DIAGNOSIS — M9979 Connective tissue and disc stenosis of intervertebral foramina of abdomen and other regions: Secondary | ICD-10-CM | POA: Insufficient documentation

## 2014-12-25 DIAGNOSIS — R839 Unspecified abnormal finding in cerebrospinal fluid: Secondary | ICD-10-CM | POA: Insufficient documentation

## 2014-12-25 DIAGNOSIS — E039 Hypothyroidism, unspecified: Secondary | ICD-10-CM | POA: Insufficient documentation

## 2014-12-25 DIAGNOSIS — M5481 Occipital neuralgia: Secondary | ICD-10-CM | POA: Insufficient documentation

## 2014-12-25 DIAGNOSIS — I10 Essential (primary) hypertension: Secondary | ICD-10-CM | POA: Insufficient documentation

## 2014-12-25 DIAGNOSIS — M720 Palmar fascial fibromatosis [Dupuytren]: Secondary | ICD-10-CM | POA: Insufficient documentation

## 2014-12-25 DIAGNOSIS — N951 Menopausal and female climacteric states: Secondary | ICD-10-CM | POA: Insufficient documentation

## 2014-12-25 DIAGNOSIS — R202 Paresthesia of skin: Secondary | ICD-10-CM | POA: Insufficient documentation

## 2014-12-25 DIAGNOSIS — R252 Cramp and spasm: Secondary | ICD-10-CM | POA: Insufficient documentation

## 2014-12-25 DIAGNOSIS — R0602 Shortness of breath: Secondary | ICD-10-CM | POA: Insufficient documentation

## 2014-12-25 DIAGNOSIS — N6019 Diffuse cystic mastopathy of unspecified breast: Secondary | ICD-10-CM | POA: Insufficient documentation

## 2014-12-25 DIAGNOSIS — L608 Other nail disorders: Secondary | ICD-10-CM | POA: Insufficient documentation

## 2014-12-25 DIAGNOSIS — G250 Essential tremor: Secondary | ICD-10-CM | POA: Insufficient documentation

## 2014-12-25 DIAGNOSIS — G43919 Migraine, unspecified, intractable, without status migrainosus: Secondary | ICD-10-CM | POA: Insufficient documentation

## 2014-12-25 DIAGNOSIS — F5101 Primary insomnia: Secondary | ICD-10-CM | POA: Insufficient documentation

## 2014-12-25 DIAGNOSIS — E7800 Pure hypercholesterolemia, unspecified: Secondary | ICD-10-CM | POA: Insufficient documentation

## 2014-12-25 NOTE — Telephone Encounter (Signed)
Last OV 06/25/2014

## 2014-12-26 ENCOUNTER — Other Ambulatory Visit: Payer: Self-pay | Admitting: Family Medicine

## 2014-12-26 ENCOUNTER — Other Ambulatory Visit: Payer: Self-pay

## 2014-12-26 DIAGNOSIS — E559 Vitamin D deficiency, unspecified: Secondary | ICD-10-CM

## 2014-12-26 DIAGNOSIS — G43809 Other migraine, not intractable, without status migrainosus: Secondary | ICD-10-CM

## 2014-12-26 MED ORDER — VERAPAMIL HCL ER 120 MG PO CP24: 120.0000 mg | ORAL_CAPSULE | Freq: Once | ORAL | Status: DC

## 2014-12-27 ENCOUNTER — Encounter: Payer: Self-pay | Admitting: Physician Assistant

## 2014-12-27 ENCOUNTER — Ambulatory Visit (INDEPENDENT_AMBULATORY_CARE_PROVIDER_SITE_OTHER): Payer: Medicare PPO | Admitting: Physician Assistant

## 2014-12-27 VITALS — BP 114/60 | HR 62 | Temp 98.2°F | Resp 16 | Wt 171.2 lb

## 2014-12-27 DIAGNOSIS — R42 Dizziness and giddiness: Secondary | ICD-10-CM | POA: Diagnosis not present

## 2014-12-27 DIAGNOSIS — H8113 Benign paroxysmal vertigo, bilateral: Secondary | ICD-10-CM

## 2014-12-27 DIAGNOSIS — M722 Plantar fascial fibromatosis: Secondary | ICD-10-CM | POA: Diagnosis not present

## 2014-12-27 DIAGNOSIS — M25571 Pain in right ankle and joints of right foot: Secondary | ICD-10-CM | POA: Diagnosis not present

## 2014-12-27 MED ORDER — MECLIZINE HCL 25 MG PO TABS: 25.0000 mg | ORAL_TABLET | Freq: Three times a day (TID) | ORAL | Status: DC | PRN

## 2014-12-27 NOTE — Progress Notes (Signed)
[name] Female    DOB: Sep 03, 1940   74 y.o.   MRN: [id number] Visit Date: 12/27/2014  Today's Provider: Mar Daring, PA-C   Chief Complaint  Patient presents with  . Dizziness   Subjective:    Dizziness This is a new problem. The current episode started in the past 7 days. The problem occurs intermittently. The problem has been unchanged. Pertinent negatives include no chest pain, fatigue, headaches, nausea, numbness, vertigo, visual change or weakness. The symptoms are aggravated by walking (when walking feels like is going to fall). She has tried drinking (gatorade, water, [employer] brand of motion sickness) for the symptoms. The treatment provided no relief.  Per patient started taking Meloxicam right after she finished the prednisone and stopped the Meloxicam on Sunday, 12/23/2014 due side effect being dizziness to see if she would feel better. On Tuesday, 12/25/2014 she did not have any dizziness and thought that it may have been the meloxicam. When she awoke on Wednesday she had the dizziness returned. It does seem to occur when she awakes and gets out of bed, anytime she makes a quick movement with her head, and with walking. She states that if she can keep her head straight that she does not have any dizziness. She also does have tinnitus which she has had for many years. She states that since the dizziness started the tinnitus has been much louder than it normally is. She does have an extensive medical history including migraines, hydrocephalus, and very mild MS. She does see neurology for these issues. She was most recently seen on 12/19/2014 and had a completely normal checkup. She was not having the dizziness at that time.     Allergies  Allergen Reactions  . Shellfish Allergy     Throat closes, rash   Previous Medications   ALPRAZOLAM (XANAX) 0.25 MG TABLET    Take 0.25 mg by mouth once. Take two tablets once at bedtime   ASPIRIN 81 MG TABLET     Take 81 mg by mouth daily.   ATENOLOL (TENORMIN) 50 MG TABLET    take 1 tablet by mouth once daily   BAYER CONTOUR TEST TEST STRIP       CYCLOBENZAPRINE (FLEXERIL) 10 MG TABLET    Take 1 tablet (10 mg total) by mouth at bedtime.   D3-50 50000 UNITS CAPSULE    take 1 capsule EVERY MONTH   FLUZONE HIGH-DOSE INJECTION    Inject 180 mLs into the muscle once.   GABAPENTIN (NEURONTIN) 100 MG CAPSULE    Take 2 capsules (200 mg total) by mouth 2 (two) times daily. Take 2 tablet in am and 2 tablet at lunch.   GABAPENTIN (NEURONTIN) 300 MG CAPSULE    Take 1 capsule (300 mg total) by mouth at bedtime.   HYDROCODONE-ACETAMINOPHEN 5-300 MG TABS    Take 1 tablet by mouth 2 (two) times daily as needed.   LEVOTHYROXINE SODIUM 88 MCG CAPS    Take by mouth daily before breakfast.   LOSARTAN (COZAAR) 50 MG TABLET    take 1 tablet by mouth at bedtime   MELOXICAM (MOBIC) 7.5 MG TABLET    Take 7.5 mg by mouth daily.   POTASSIUM CHLORIDE SA (K-DUR,KLOR-CON) 20 MEQ TABLET    take 1 tablet by mouth twice a day   ROSUVASTATIN (CRESTOR) 10 MG TABLET    Take 10 mg by mouth at bedtime.   SUMATRIPTAN 6 MG/0.5ML SOAJ  Inject 6 mg into the skin once as needed.   TOPIRAMATE (TOPAMAX) 100 MG TABLET    Take 2 tablets (200 mg total) by mouth every evening.   TOPIRAMATE (TOPAMAX) 25 MG TABLET    Take 1 tablet (25 mg total) by mouth every morning.   TRIAMTERENE-HYDROCHLOROTHIAZIDE (MAXZIDE) 75-50 MG PER TABLET    Take 75 tablets by mouth once. Take 1/2 tablet once a day   VERAPAMIL (VERELAN PM) 120 MG 24 HR CAPSULE    Take 1 capsule (120 mg total) by mouth once.   VITAMIN D, ERGOCALCIFEROL, (DRISDOL) 50000 UNITS CAPS CAPSULE    Take 50,000 Units by mouth every 30 (thirty) days.   ZETIA 10 MG TABLET    take 1 tablet by mouth at bedtime   ZOLPIDEM (AMBIEN) 10 MG TABLET    take 1 tablet by mouth at bedtime    Review of Systems  Constitutional: Negative.  Negative for fatigue.  HENT: Positive for tinnitus (had this for years, but  now is louder and longer).   Eyes: Negative.   Cardiovascular: Negative.  Negative for chest pain and palpitations.  Gastrointestinal: Negative.  Negative for nausea.  Genitourinary: Negative.   Musculoskeletal: Negative.   Allergic/Immunologic: Negative.   Neurological: Positive for dizziness and light-headedness. Negative for vertigo, weakness, numbness and headaches.  Hematological: Negative.   Psychiatric/Behavioral: Negative.     Social History  Substance Use Topics  . Smoking status: Never Smoker   . Smokeless tobacco: Never Used  . Alcohol Use: 0.0 oz/week    0 Standard drinks or equivalent per week     Comment: Rare occation    Objective:   BP 114/60 mmHg  Pulse 62  Temp(Src) 98.2 F (36.8 C) (Oral)  Resp 16  Wt 171 lb 3.2 oz (77.656 kg)  Physical Exam  Constitutional: She is oriented to person, place, and time. She appears well-developed and well-nourished. No distress.  HENT:  Head: Normocephalic and atraumatic.  Right Ear: Hearing, tympanic membrane, external ear and ear canal normal.  Left Ear: Hearing, tympanic membrane, external ear and ear canal normal.  Nose: Nose normal. Right sinus exhibits no maxillary sinus tenderness and no frontal sinus tenderness. Left sinus exhibits no maxillary sinus tenderness and no frontal sinus tenderness.  Mouth/Throat: Uvula is midline, oropharynx is clear and moist and mucous membranes are normal. No oropharyngeal exudate.  Eyes: Conjunctivae and EOM are normal. Pupils are equal, round, and reactive to light. Right eye exhibits no discharge. Left eye exhibits no discharge. No scleral icterus. Right eye exhibits no nystagmus. Left eye exhibits no nystagmus.  Neck: Normal range of motion. Neck supple. No JVD present. No tracheal deviation present. No thyromegaly present.  Cardiovascular: Normal rate, regular rhythm and normal heart sounds.  Exam reveals no gallop and no friction rub.   No murmur heard. Pulmonary/Chest: Effort  normal and breath sounds normal. No stridor. No respiratory distress. She has no wheezes. She has no rales.  Lymphadenopathy:    She has no cervical adenopathy.  Neurological: She is alert and oriented to person, place, and time. No cranial nerve deficit or sensory deficit. She displays a negative Romberg sign. Coordination and gait normal.  Positive Dix-Hallpike maneuver for recreation of dizziness  Skin: She is not diaphoretic.  Psychiatric: She has a normal mood and affect. Her behavior is normal. Judgment and thought content normal.  Vitals reviewed.       Assessment & Plan:     1. BPPV (benign paroxysmal positional vertigo),  bilateral I do feel that her symptoms are most likely benign paroxysmal positional vertigo. It does seem that she is affected from both sides. I will give meclizine as below to help with symptomatic treatment. I also advised that she may also take a Sudafed or decongestant for short period of time with the meclizine. I also instructed her and also gave a handout on how to perform the Brandt-Daroff maneuver for treatment. She voiced understanding of the treatment in the exercises. I did advise her that if she is still having symptoms by next Monday, 12/31/2014, she is to call the office for a follow-up visit as her medical history would lead to have a more extensive workup if the dizziness persists. - meclizine (ANTIVERT) 25 MG tablet; Take 1 tablet (25 mg total) by mouth 3 (three) times daily as needed for dizziness.  Dispense: 30 tablet; Refill: 0  2. Dizziness See above medical treatment plan. - meclizine (ANTIVERT) 25 MG tablet; Take 1 tablet (25 mg total) by mouth 3 (three) times daily as needed for dizziness.  Dispense: 30 tablet; Refill: 0       Mar Daring, PA-C  [facility]

## 2014-12-27 NOTE — Patient Instructions (Signed)
Benign Positional Vertigo Vertigo means you feel like you or your surroundings are moving when they are not. Benign positional vertigo is the most common form of vertigo. Benign means that the cause of your condition is not serious. Benign positional vertigo is more common in older adults. CAUSES  Benign positional vertigo is the result of an upset in the labyrinth system. This is an area in the middle ear that helps control your balance. This may be caused by a viral infection, head injury, or repetitive motion. However, often no specific cause is found. SYMPTOMS  Symptoms of benign positional vertigo occur when you move your head or eyes in different directions. Some of the symptoms may include:  Loss of balance and falls.  Vomiting.  Blurred vision.  Dizziness.  Nausea.  Involuntary eye movements (nystagmus). DIAGNOSIS  Benign positional vertigo is usually diagnosed by physical exam. If the specific cause of your benign positional vertigo is unknown, your caregiver may perform imaging tests, such as magnetic resonance imaging (MRI) or computed tomography (CT). TREATMENT  Your caregiver may recommend movements or procedures to correct the benign positional vertigo. Medicines such as meclizine, benzodiazepines, and medicines for nausea may be used to treat your symptoms. In rare cases, if your symptoms are caused by certain conditions that affect the inner ear, you may need surgery. HOME CARE INSTRUCTIONS   Follow your caregiver's instructions.  Move slowly. Do not make sudden body or head movements.  Avoid driving.  Avoid operating heavy machinery.  Avoid performing any tasks that would be dangerous to you or others during a vertigo episode.  Drink enough fluids to keep your urine clear or pale yellow. SEEK IMMEDIATE MEDICAL CARE IF:   You develop problems with walking, weakness, numbness, or using your arms, hands, or legs.  You have difficulty speaking.  You develop  severe headaches.  Your nausea or vomiting continues or gets worse.  You develop visual changes.  Your family or friends notice any behavioral changes.  Your condition gets worse.  You have a fever.  You develop a stiff neck or sensitivity to light. MAKE SURE YOU:   Understand these instructions.  Will watch your condition.  Will get help right away if you are not doing well or get worse. Document Released: 01/12/2006 Document Revised: 06/29/2011 Document Reviewed: 12/25/2010 Exit[employer] Patient Information 2015 [employer]. This information is not intended to replace advice given to you by your health care provider. Make sure you discuss any questions you have with your health care provider.    

## 2014-12-31 DIAGNOSIS — M722 Plantar fascial fibromatosis: Secondary | ICD-10-CM | POA: Diagnosis not present

## 2014-12-31 DIAGNOSIS — M25571 Pain in right ankle and joints of right foot: Secondary | ICD-10-CM | POA: Diagnosis not present

## 2015-01-03 DIAGNOSIS — M722 Plantar fascial fibromatosis: Secondary | ICD-10-CM | POA: Diagnosis not present

## 2015-01-03 DIAGNOSIS — M25571 Pain in right ankle and joints of right foot: Secondary | ICD-10-CM | POA: Diagnosis not present

## 2015-01-07 DIAGNOSIS — D225 Melanocytic nevi of trunk: Secondary | ICD-10-CM | POA: Diagnosis not present

## 2015-01-07 DIAGNOSIS — D1801 Hemangioma of skin and subcutaneous tissue: Secondary | ICD-10-CM | POA: Diagnosis not present

## 2015-01-07 DIAGNOSIS — Z85828 Personal history of other malignant neoplasm of skin: Secondary | ICD-10-CM | POA: Diagnosis not present

## 2015-01-07 DIAGNOSIS — L821 Other seborrheic keratosis: Secondary | ICD-10-CM | POA: Diagnosis not present

## 2015-01-07 DIAGNOSIS — D2361 Other benign neoplasm of skin of right upper limb, including shoulder: Secondary | ICD-10-CM | POA: Diagnosis not present

## 2015-01-10 DIAGNOSIS — M722 Plantar fascial fibromatosis: Secondary | ICD-10-CM | POA: Diagnosis not present

## 2015-01-10 DIAGNOSIS — M25571 Pain in right ankle and joints of right foot: Secondary | ICD-10-CM | POA: Diagnosis not present

## 2015-01-14 DIAGNOSIS — M722 Plantar fascial fibromatosis: Secondary | ICD-10-CM | POA: Diagnosis not present

## 2015-01-14 DIAGNOSIS — M25571 Pain in right ankle and joints of right foot: Secondary | ICD-10-CM | POA: Diagnosis not present

## 2015-01-15 ENCOUNTER — Other Ambulatory Visit: Payer: Self-pay

## 2015-01-15 DIAGNOSIS — M5481 Occipital neuralgia: Secondary | ICD-10-CM

## 2015-01-15 MED ORDER — CYCLOBENZAPRINE HCL 10 MG PO TABS: 10.0000 mg | ORAL_TABLET | Freq: Every day | ORAL | Status: DC

## 2015-01-17 DIAGNOSIS — M722 Plantar fascial fibromatosis: Secondary | ICD-10-CM | POA: Diagnosis not present

## 2015-01-17 DIAGNOSIS — M25571 Pain in right ankle and joints of right foot: Secondary | ICD-10-CM | POA: Diagnosis not present

## 2015-01-21 ENCOUNTER — Other Ambulatory Visit: Payer: Self-pay | Admitting: Family Medicine

## 2015-01-21 DIAGNOSIS — G4700 Insomnia, unspecified: Secondary | ICD-10-CM

## 2015-01-21 DIAGNOSIS — M722 Plantar fascial fibromatosis: Secondary | ICD-10-CM | POA: Diagnosis not present

## 2015-01-21 DIAGNOSIS — M9979 Connective tissue and disc stenosis of intervertebral foramina of abdomen and other regions: Secondary | ICD-10-CM

## 2015-01-21 DIAGNOSIS — M25571 Pain in right ankle and joints of right foot: Secondary | ICD-10-CM | POA: Diagnosis not present

## 2015-01-21 MED ORDER — HYDROCODONE-ACETAMINOPHEN 5-300 MG PO TABS: 1.0000 | ORAL_TABLET | Freq: Two times a day (BID) | ORAL | Status: DC | PRN

## 2015-01-21 NOTE — Telephone Encounter (Signed)
Please fax in rx to [facility]. Thanks.

## 2015-01-21 NOTE — Telephone Encounter (Signed)
Prescription printed. Please notify patient it is ready for pick up. Thanks- Dr. Arwyn Besaw.  

## 2015-01-21 NOTE — Telephone Encounter (Signed)
Refill request Hydrocodone-Acetaminophen 5-300 MG TABS Taking 11/02/14 -- Margarita Rana, MD Take 1 tablet by mouth 2 (two) times daily as needed.   Call back [phone]  Thanks Con Memos

## 2015-01-28 ENCOUNTER — Encounter: Payer: Self-pay | Admitting: Family Medicine

## 2015-01-28 ENCOUNTER — Ambulatory Visit (INDEPENDENT_AMBULATORY_CARE_PROVIDER_SITE_OTHER): Payer: Medicare PPO | Admitting: Family Medicine

## 2015-01-28 ENCOUNTER — Telehealth: Payer: Self-pay

## 2015-01-28 VITALS — BP 130/68 | HR 60 | Temp 97.8°F | Resp 16 | Wt 175.0 lb

## 2015-01-28 DIAGNOSIS — G43009 Migraine without aura, not intractable, without status migrainosus: Secondary | ICD-10-CM

## 2015-01-28 DIAGNOSIS — R413 Other amnesia: Secondary | ICD-10-CM | POA: Diagnosis not present

## 2015-01-28 DIAGNOSIS — G250 Essential tremor: Secondary | ICD-10-CM

## 2015-01-28 NOTE — Telephone Encounter (Signed)
Calling patient to schedule appt w/ NP-Megan Millikan. No answer. Will try later.

## 2015-01-28 NOTE — Progress Notes (Signed)
Patient ID: Cathy Barnes, female   DOB: Jun 27, 1940, 74 y.o.   MRN: [id number]        [name] Female    DOB: December 27, 1940   74 y.o.   MRN: [id number] Visit Date: 01/28/2015  Today's Provider: Margarita Rana, MD   Chief Complaint  Patient presents with  . Memory Loss   Subjective:    HPI  Dementia: She is here for evaluation and treatment of cognitive problems.  Primary caregiver is patient.  The patient identifies problems with changes in short term memory, numbers only .  Patient report problems with short term memory with numbers. Family and patient are concerned about  Only concern is just about memory loss with numbers" pt stated, however, they are not concerned about nothing else" pt stated.  Functional Assessment:  Activities of Daily Living (ADLs):   She is independent in the following: All  Requires assistance with the following: no Instrumental Activities of Daily Living (IADLs):   She is independent in the following: ADL Requires assistance with the following: no assistance  Cognitive Testing - 6-CIT   Correct? Score   What year is it? yes 0 Yes = 0    No = 4  What month is it? yes 0 Yes = 0    No = 3  Remember:     Cathy Barnes, Cathy Barnes, Cathy Barnes     What time is it? yes 0 Yes = 0    No = 3  Count backwards from 20 to 1 yes 0 Correct = 0    1 error = 2   More than 1 error = 4  Say the months of the year in reverse. yes 0 Correct = 0    1 error = 2   More than 1 error = 4  What address did I ask you to remember? yes 0 Correct = 0  1 error = 2    2 error = 4    3 error = 6    4 error = 8    All wrong = 10       TOTAL SCORE  0/28   Interpretation:  Normal  Normal (0-7) Abnormal (8-28)        Allergies  Allergen Reactions  . Shellfish Allergy     Throat closes, rash   Previous Medications   ALPRAZOLAM (XANAX) 0.25 MG TABLET    take 2 tablets by mouth at bedtime   ASPIRIN 81 MG TABLET    Take 81 mg by mouth daily.   ATENOLOL (TENORMIN)  50 MG TABLET    take 1 tablet by mouth once daily   BAYER CONTOUR TEST TEST STRIP       CYCLOBENZAPRINE (FLEXERIL) 10 MG TABLET    Take 1 tablet (10 mg total) by mouth at bedtime.   D3-50 50000 UNITS CAPSULE    take 1 capsule EVERY MONTH   FLUZONE HIGH-DOSE INJECTION    Inject 180 mLs into the muscle once.   GABAPENTIN (NEURONTIN) 100 MG CAPSULE    Take 2 capsules (200 mg total) by mouth 2 (two) times daily. Take 2 tablet in am and 2 tablet at lunch.   GABAPENTIN (NEURONTIN) 300 MG CAPSULE    Take 1 capsule (300 mg total) by mouth at bedtime.   HYDROCODONE-ACETAMINOPHEN 5-300 MG TABS    Take 1 tablet by mouth 2 (two) times daily as needed.   LEVOTHYROXINE SODIUM 88 MCG CAPS  Take by mouth daily before breakfast.   LOSARTAN (COZAAR) 50 MG TABLET    take 1 tablet by mouth at bedtime   MECLIZINE (ANTIVERT) 25 MG TABLET    Take 1 tablet (25 mg total) by mouth 3 (three) times daily as needed for dizziness.   MELOXICAM (MOBIC) 7.5 MG TABLET    Take 7.5 mg by mouth daily.   POTASSIUM CHLORIDE SA (K-DUR,KLOR-CON) 20 MEQ TABLET    take 1 tablet by mouth twice a day   ROSUVASTATIN (CRESTOR) 10 MG TABLET    Take 10 mg by mouth at bedtime.   SUMATRIPTAN 6 MG/0.5ML SOAJ    Inject 6 mg into the skin once as needed.   TOPIRAMATE (TOPAMAX) 100 MG TABLET    Take 2 tablets (200 mg total) by mouth every evening.   TOPIRAMATE (TOPAMAX) 25 MG TABLET    Take 1 tablet (25 mg total) by mouth every morning.   TRIAMTERENE-HYDROCHLOROTHIAZIDE (MAXZIDE) 75-50 MG PER TABLET    Take 75 tablets by mouth once. Take 1/2 tablet once a day   VERAPAMIL (VERELAN PM) 120 MG 24 HR CAPSULE    Take 1 capsule (120 mg total) by mouth once.   VITAMIN D, ERGOCALCIFEROL, (DRISDOL) 50000 UNITS CAPS CAPSULE    Take 50,000 Units by mouth every 30 (thirty) days.   ZETIA 10 MG TABLET    take 1 tablet by mouth at bedtime   ZOLPIDEM (AMBIEN) 10 MG TABLET    take 1 tablet by mouth at bedtime    Review of Systems  Constitutional: Negative.   Negative for appetite change.  Cardiovascular: Negative.   Endocrine: Negative.   Neurological: Positive for headaches.       Saw her neurologist a month ago. Have headaches for some time, the neurologist Dr. [name] from [facility]." pt stated  Psychiatric/Behavioral: Negative.     Social History  Substance Use Topics  . Smoking status: Never Smoker   . Smokeless tobacco: Never Used  . Alcohol Use: 0.0 oz/week    0 Standard drinks or equivalent per week     Comment: Rare occation    Objective:   BP 130/68 mmHg  Pulse 60  Temp(Src) 97.8 F (36.6 C) (Oral)  Resp 16  Wt 175 lb (79.379 kg)  Physical Exam  Constitutional: She is oriented to person, place, and time. She appears well-developed and well-nourished.  Neurological: She is alert and oriented to person, place, and time.  Psychiatric: She has a normal mood and affect. Her behavior is normal. Judgment and thought content normal.        Assessment & Plan:     1. Memory loss New problem. Really just for numbers.  Will refer to neurologist from same group.   - Ambulatory referral to Neurology  2. Benign essential tremor Doing very well.   3. Migraine without aura and without status migrainosus, not intractable Stable.   Margarita Rana, MD        Margarita Rana, MD  [facility]

## 2015-01-31 ENCOUNTER — Telehealth: Payer: Self-pay | Admitting: Neurology

## 2015-01-31 DIAGNOSIS — M722 Plantar fascial fibromatosis: Secondary | ICD-10-CM | POA: Diagnosis not present

## 2015-01-31 DIAGNOSIS — M25571 Pain in right ankle and joints of right foot: Secondary | ICD-10-CM | POA: Diagnosis not present

## 2015-01-31 NOTE — Telephone Encounter (Signed)
I talked with pt and she understands she will see Dr Jaynee Eagles for all medical care with [facility].

## 2015-01-31 NOTE — Telephone Encounter (Signed)
I think Cathy Barnes may be working on this. I will check with her.

## 2015-01-31 NOTE — Telephone Encounter (Signed)
Thank you, I will let Dr. [name] know.

## 2015-01-31 NOTE — Telephone Encounter (Addendum)
Pt called week of 01/28/15 stating she was having a new problem(memory) but she would like to see another Dr at [facility] as she felt like she and Dr [name] "did  Not click". She had nothing bad to say just they didn't seem to click. She has been seeing Jinny Blossom and would really like to have seen [facility] for this problem but I explained pt would need to see Dr first. I sent a staff message with this information to Dr [name], Dr Jaynee Eagles and Marcie Bal. I have left a message for pt to call me to confirm with her that she understands she will have to see Dr Jaynee Eagles for all health issues related to [place].

## 2015-02-03 ENCOUNTER — Other Ambulatory Visit: Payer: Self-pay | Admitting: Family Medicine

## 2015-02-03 DIAGNOSIS — E785 Hyperlipidemia, unspecified: Secondary | ICD-10-CM

## 2015-02-07 ENCOUNTER — Encounter: Payer: Self-pay | Admitting: Neurology

## 2015-02-07 ENCOUNTER — Ambulatory Visit (INDEPENDENT_AMBULATORY_CARE_PROVIDER_SITE_OTHER): Payer: Medicare PPO | Admitting: Neurology

## 2015-02-07 VITALS — BP 148/68 | HR 65 | Ht 63.0 in | Wt 171.2 lb

## 2015-02-07 DIAGNOSIS — R251 Tremor, unspecified: Secondary | ICD-10-CM

## 2015-02-07 DIAGNOSIS — E538 Deficiency of other specified B group vitamins: Secondary | ICD-10-CM | POA: Diagnosis not present

## 2015-02-07 DIAGNOSIS — G3184 Mild cognitive impairment, so stated: Secondary | ICD-10-CM | POA: Insufficient documentation

## 2015-02-07 DIAGNOSIS — R5383 Other fatigue: Secondary | ICD-10-CM

## 2015-02-07 DIAGNOSIS — E519 Thiamine deficiency, unspecified: Secondary | ICD-10-CM

## 2015-02-07 DIAGNOSIS — M722 Plantar fascial fibromatosis: Secondary | ICD-10-CM | POA: Diagnosis not present

## 2015-02-07 DIAGNOSIS — M25571 Pain in right ankle and joints of right foot: Secondary | ICD-10-CM | POA: Diagnosis not present

## 2015-02-07 DIAGNOSIS — R413 Other amnesia: Secondary | ICD-10-CM | POA: Diagnosis not present

## 2015-02-07 MED ORDER — DONEPEZIL HCL 5 MG PO TABS: 5.0000 mg | ORAL_TABLET | Freq: Every day | ORAL | Status: DC

## 2015-02-07 MED ORDER — TOPIRAMATE 100 MG PO TABS: 100.0000 mg | ORAL_TABLET | Freq: Every evening | ORAL | Status: DC

## 2015-02-07 NOTE — Progress Notes (Signed)
[facility]    Provider:  Dr Jaynee Eagles Referring Provider: Margarita Rana, MD Primary Care Physician:  Margarita Rana, MD  CC:  Memory loss  HPI:  Cathy Barnes is a 74 y.o. female here as a referral from Dr. Venia Minks for memory loss. PMHx of migraine, HTN, essential tremor. She is a patient of Dr. [name] and Ward Givens and is seeing me today. Symptoms started within the last 2 months. She is having problems with numbers. She is having some short-term memory changes but most significantly with numbers. 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 She is scared, something is not right. Started acutely and getting worse.  She took some prednisone for her foot and then this problem started. That may be a coincidence. She has stopped the steroids. No new medication changes, nothing new. No head trauma or inciting events. No other short-term memory changes except sometimes she forgets little things when she is very tired. Her TSH is monitored by pcp for hypothyroidism. No snoring at night, not excessively tired during the day, she had a sleep test in the past. No alteration of consciousness. No other focal neurologic symptoms.   Review of Systems: Patient complains of symptoms per HPI as well as the following symptoms: ringing in ears, cramps, memory los, headache, dizziness, tremor. Pertinent negatives per HPI. All others negative.   Social History   Social History  . Marital Status: Divorced    Spouse Name: N/A  . Number of Children: 1  . Years of Education: college   Occupational History  . book keeping Other    [employer]   Social History Main Topics  . Smoking status: Never Smoker   . Smokeless tobacco: Never Used  . Alcohol Use: 0.0 oz/week    0 Standard drinks or equivalent per week     Comment: Rare occation   . Drug Use: No    . Sexual Activity: Not on file   Other Topics Concern  . Not on file   Social History Narrative   Patient is right handed, resides alone. Divorced.    Family History  Problem Relation Age of Onset  . Colon cancer Mother   . Hypertension Mother   . Colon cancer Father   . Heart disease Father   . Hypertension Father     Past Medical History  Diagnosis Date  . Migraine, unspecified, without mention of intractable migraine without mention of status migrainosus 12/14/2012  . Hyperglycemia   . Hyperlipidemia   . Hypertension   . Osteoarthritis, chronic     Past Surgical History  Procedure Laterality Date  . Partial hysterectomy    . Breast cyst aspiration Left 1993    Removed  . History of cataract surgery Right 2011  . Tubal ligation  1975    Bilateral  . S/p acl repair Left     knee    Current Outpatient Prescriptions  Medication Sig Dispense Refill  . ALPRAZolam (XANAX) 0.25 MG tablet take 2 tablets by mouth at bedtime 60 tablet 5  . aspirin 81 MG tablet Take 81 mg by mouth daily.    [name] atenolol (TENORMIN) 50 MG tablet take 1 tablet by mouth once daily 30 tablet 5  . BAYER CONTOUR TEST test strip     . cyclobenzaprine (FLEXERIL) 10 MG tablet Take  1 tablet (10 mg total) by mouth at bedtime. 30 tablet 0  . D3-50 50000 UNITS capsule take 1 capsule EVERY MONTH 1 capsule 6  . FLUZONE HIGH-DOSE injection Inject 180 mLs into the muscle once.    . gabapentin (NEURONTIN) 100 MG capsule Take 2 capsules (200 mg total) by mouth 2 (two) times daily. Take 2 tablet in am and 2 tablet at lunch. 360 capsule 0  . gabapentin (NEURONTIN) 300 MG capsule Take 1 capsule (300 mg total) by mouth at bedtime. 90 capsule 0  . Hydrocodone-Acetaminophen 5-300 MG TABS Take 1 tablet by mouth 2 (two) times daily as needed. 60 each 0  . Levothyroxine Sodium 88 MCG CAPS Take by mouth daily before breakfast.    . losartan (COZAAR) 50 MG tablet take 1 tablet by mouth at bedtime 30 tablet 5  . potassium  chloride SA (K-DUR,KLOR-CON) 20 MEQ tablet take 1 tablet by mouth twice a day 60 tablet 5  . SUMAtriptan 6 MG/0.5ML SOAJ Inject 6 mg into the skin once as needed. 2 cartridge 1  . topiramate (TOPAMAX) 100 MG tablet Take 2 tablets (200 mg total) by mouth every evening. 180 tablet 3  . topiramate (TOPAMAX) 25 MG tablet Take 1 tablet (25 mg total) by mouth every morning. 90 tablet 3  . triamterene-hydrochlorothiazide (MAXZIDE) 75-50 MG per tablet Take 75 tablets by mouth once. Take 1/2 tablet once a day    . verapamil (VERELAN PM) 120 MG 24 hr capsule Take 1 capsule (120 mg total) by mouth once. 90 capsule 1  . Vitamin D, Ergocalciferol, (DRISDOL) 50000 UNITS CAPS capsule Take 50,000 Units by mouth every 30 (thirty) days.    [name] ZETIA 10 MG tablet take 1 tablet by mouth at bedtime 30 tablet 5  . zolpidem (AMBIEN) 10 MG tablet take 1 tablet by mouth at bedtime 30 tablet 5   No current facility-administered medications for this visit.    Allergies as of 02/07/2015 - Review Complete 02/07/2015  Allergen Reaction Noted  . Shellfish allergy  12/17/2014    Vitals: BP 148/68 mmHg  Pulse 65  Ht 5\' 3"  (1.6 m)  Wt 171 lb 3.2 oz (77.656 kg)  BMI 30.33 kg/m2 Last Weight:  Wt Readings from Last 1 Encounters:  02/07/15 171 lb 3.2 oz (77.656 kg)   Last Height:   Ht Readings from Last 1 Encounters:  02/07/15 5\' 3"  (1.6 m)   Physical exam: Exam: Gen: NAD, conversant, well nourised, obese, well groomed                     Eyes: Conjunctivae clear without exudates or hemorrhage  Neuro: Detailed Neurologic Exam  Speech:    Speech is normal; fluent and spontaneous with normal comprehension.  Cognition:     Montreal Cognitive Assessment  02/07/2015  Visuospatial/ Executive (0/5) 3  Naming (0/3) 2  Attention: Read list of digits (0/2) 2  Attention: Read list of letters (0/1) 1  Attention: Serial 7 subtraction starting at 100 (0/3) 1  Language: Repeat phrase (0/2) 0  Language : Fluency (0/1) 1    Abstraction (0/2) 2  Delayed Recall (0/5) 5  Orientation (0/6) 6  Total 23  Adjusted Score (based on education) 23    Cranial Nerves:    The pupils are equal, round, and reactive to light. Visual fields are full.. Extraocular movements are intact. The face is symmetric. Hearing intact. Voice is normal.  Gait:    Not ataxic  Motor Observation:  No asymmetry, no atrophy, and no involuntary movements noted. Tone:    Normal muscle tone.    Posture:    Posture is normal. normal erect    Assessment/Plan:  74 y.o. female here as a referral from Dr. Venia Minks for memory loss. MoCA 23/30, possibly mild cognitive impairment. Will order an MRI of the brain, B12, start Aricept. Could also very well be medication related as she is on a lot of medications that can cause cognitive problems such as topamax, flexeril, neurontin, alprazolam. Will decrease Topamax to 100mg  qhs. Discussed medications with patient.  Sarina Ill, MD  [facility] [address] [place], [place] [address]  Phone [phone] Fax [phone]  A total of 30 minutes was spent face-to-face with this patient. Over half this time was spent on counseling patient on the mild cogntive impairment diagnosis and different diagnostic and therapeutic options available.

## 2015-02-07 NOTE — Patient Instructions (Addendum)
Remember to drink plenty of fluid, eat healthy meals and do not skip any meals. Try to eat protein with a every meal and eat a healthy snack such as fruit or nuts in between meals. Try to keep a regular sleep-wake schedule and try to exercise daily, particularly in the form of walking, 20-30 minutes a day, if you can.   As far as your medications are concerned, I would like to suggest: Aricept 5mg  then can increase to 10mg  in 2-3 weeks if no side effects Decrease Topiramate from 200mg  to 100mg  at night  As far as diagnostic testing: MRI of the brain, labs  I would like to see you back in 3-4 months, sooner if we need to. Please call us with any interim questions, concerns, problems, updates or refill requests.   Our phone number is [phone]. We also have an after hours call service for urgent matters and there is a physician on-call for urgent questions. For any emergencies you know to call 911 or go to the nearest emergency room

## 2015-02-10 LAB — B12 AND FOLATE PANEL
Folate: 10.4 ng/mL (ref >3.0)
Vitamin B-12: 457 pg/mL (ref 211–946)

## 2015-02-10 LAB — VITAMIN B1: Thiamine: 160 nmol/L (ref 66.5–200.0)

## 2015-02-10 LAB — METHYLMALONIC ACID, SERUM: METHYLMALONIC ACID: 296 nmol/L (ref 0–378)

## 2015-02-11 ENCOUNTER — Telehealth: Payer: Self-pay | Admitting: *Deleted

## 2015-02-11 DIAGNOSIS — M722 Plantar fascial fibromatosis: Secondary | ICD-10-CM | POA: Diagnosis not present

## 2015-02-11 DIAGNOSIS — M19071 Primary osteoarthritis, right ankle and foot: Secondary | ICD-10-CM | POA: Diagnosis not present

## 2015-02-11 NOTE — Telephone Encounter (Signed)
-----   Message from [name], MD sent at 02/11/2015  7:49 AM EDT ----- Let patient know her labs were normal thanks

## 2015-02-11 NOTE — Telephone Encounter (Signed)
Patient returned call. Patient informed labs were normal.

## 2015-02-11 NOTE — Telephone Encounter (Signed)
LVM for pt to call about results. Gave [facility] phone number and office hours. Okay to inform her labs were normal.

## 2015-02-12 NOTE — Telephone Encounter (Signed)
error 

## 2015-02-14 NOTE — Telephone Encounter (Signed)
Error

## 2015-02-18 ENCOUNTER — Telehealth: Payer: Self-pay | Admitting: Neurology

## 2015-02-18 ENCOUNTER — Other Ambulatory Visit: Payer: Self-pay | Admitting: Neurology

## 2015-02-18 DIAGNOSIS — M5481 Occipital neuralgia: Secondary | ICD-10-CM

## 2015-02-18 MED ORDER — DONEPEZIL HCL 10 MG PO TABS: 10.0000 mg | ORAL_TABLET | Freq: Every day | ORAL | Status: DC

## 2015-02-18 MED ORDER — CYCLOBENZAPRINE HCL 10 MG PO TABS: 10.0000 mg | ORAL_TABLET | Freq: Every day | ORAL | Status: DC

## 2015-02-18 NOTE — Telephone Encounter (Signed)
Called pt to let her know both Rx were sent to pharmacy. She verbalized understanding.

## 2015-02-18 NOTE — Telephone Encounter (Signed)
Both sent, thanks

## 2015-02-18 NOTE — Telephone Encounter (Signed)
Called and spoke to pt. She would like to increase to 10mg  Aricept. She also is requesting refill on flexeril. She is completely out of this medication. I told her I will forward message to Dr. Jaynee Eagles. She verbalized understanding.

## 2015-02-18 NOTE — Telephone Encounter (Signed)
Pt called sts she is so happy with Dr Jaynee Eagles. The donepezil 5mg  (02/07/15) 1 at night x 1 wk for memory with numbers..it has helped. She can maintain numbers, keep them in order better and a co-worker has noticed the difference. She has had not side effects.  Topiramate 100mg  reduced to 1 pill /day with not side effects at all. Pharmacy is sending refill for cyclobenzaprine (FLEXERIL) 10 MG tablet .

## 2015-02-18 NOTE — Telephone Encounter (Signed)
That's great! We can increase to 10mg  if she likes, this is the full recommended dose. Please ask patient, thanks

## 2015-02-18 NOTE — Telephone Encounter (Signed)
LVM returning pt call. Asked her to call back. Gave [facility] phone number and hours.

## 2015-02-21 ENCOUNTER — Telehealth: Payer: Self-pay | Admitting: Neurology

## 2015-02-21 ENCOUNTER — Telehealth: Payer: Self-pay | Admitting: *Deleted

## 2015-02-21 ENCOUNTER — Ambulatory Visit
Admission: RE | Admit: 2015-02-21 | Discharge: 2015-02-21 | Disposition: A | Discharge status: Home / Self Care | Payer: Medicare PPO | Source: Ambulatory Visit | Attending: Neurology | Admitting: Neurology

## 2015-02-21 DIAGNOSIS — R413 Other amnesia: Secondary | ICD-10-CM

## 2015-02-21 DIAGNOSIS — R5383 Other fatigue: Secondary | ICD-10-CM

## 2015-02-21 DIAGNOSIS — R251 Tremor, unspecified: Secondary | ICD-10-CM

## 2015-02-21 NOTE — Telephone Encounter (Signed)
Patient called to request Cathy Barnes call back regarding donepezil (ARICEPT) 10 MG tablet, "it's absolutely helping other than causing insomnia". Is there another way to take it or is there something else she can do.

## 2015-02-21 NOTE — Telephone Encounter (Signed)
Tell her to take it first thing in the morning instead of at night and see if that helps thanks

## 2015-02-21 NOTE — Telephone Encounter (Signed)
Called pt and relayed Dr Jaynee Eagles message. Told her to take aricept first thing in the morning instead of at night. Pt verbalized understanding.

## 2015-02-21 NOTE — Telephone Encounter (Signed)
Patient called to advise that when she had MRI done, the contact number they used to contact her that they told her was provided by [place] was [phone]. Patient asks that we not use that number. That is her daughter's number. I have verified phone numbers for patient.

## 2015-02-26 ENCOUNTER — Telehealth: Payer: Self-pay | Admitting: *Deleted

## 2015-02-26 NOTE — Telephone Encounter (Signed)
-----   Message from [name], MD sent at 02/25/2015  4:49 PM EST ----- Let patient know her MRI is essentially the same as the one she had in 2010. No significant changes. Nothing concerning for dementia or strokes. She does have some non-specific white matter changes the amount of which I feel is normal for age. thanks

## 2015-02-26 NOTE — Telephone Encounter (Signed)
LVM for pt to call about results. Gave [facility] phone number and hours.  

## 2015-02-27 NOTE — Telephone Encounter (Signed)
Patient returned call

## 2015-02-27 NOTE — Telephone Encounter (Signed)
LVM returning pt call. Gave [facility] phone number.  

## 2015-02-28 ENCOUNTER — Encounter: Payer: Self-pay | Admitting: *Deleted

## 2015-02-28 MED ORDER — GABAPENTIN 300 MG PO CAPS: 300.0000 mg | ORAL_CAPSULE | Freq: Every day | ORAL | Status: DC

## 2015-02-28 MED ORDER — GABAPENTIN 100 MG PO CAPS: 200.0000 mg | ORAL_CAPSULE | Freq: Two times a day (BID) | ORAL | Status: DC

## 2015-02-28 NOTE — Telephone Encounter (Signed)
Pt returned Emma's call, please call her at home number today [phone]

## 2015-02-28 NOTE — Telephone Encounter (Signed)
We have not yet gotten a request from the pharmacy, however, refill has now been sent.

## 2015-02-28 NOTE — Telephone Encounter (Signed)
Called pt back. Advised per Dr Jaynee Eagles that her MRI is essentially the same as the one she had in 2010. No significant changes. Nothing concerning for dementia or strokes. She does have some non-specific white matter changes the amount of which Dr Jaynee Eagles feels is normal for age. Pt verbalized understanding but is having some diarrhea from Rx Aricept 10mg . Wants to know what to do. I told her I will ask Dr Jaynee Eagles and call her back.

## 2015-02-28 NOTE — Telephone Encounter (Signed)
Called and spoke to pt. Advised per Dr Jaynee Eagles to go back to 5mg  for a month and then she can try and increase it again. She can cut 10mg  tablet in half. She verbalized understanding.  She also was wondering if Dr Jaynee Eagles got a refill request for gabapentin 100mg . I told her I will check on this. She said it was not necessary for a call back.

## 2015-02-28 NOTE — Telephone Encounter (Signed)
Thanks

## 2015-02-28 NOTE — Telephone Encounter (Signed)
Decrease it back to 5mg  and stay on that for a month and we can try to increase it again

## 2015-02-28 NOTE — Telephone Encounter (Signed)
Called pt pharmacy to see if she could cut Aricept 10mg  tablet in half. Spoke w/ shane. He verbalized this would be ok for pt to do.

## 2015-03-07 DIAGNOSIS — H40013 Open angle with borderline findings, low risk, bilateral: Secondary | ICD-10-CM | POA: Diagnosis not present

## 2015-03-07 DIAGNOSIS — H04123 Dry eye syndrome of bilateral lacrimal glands: Secondary | ICD-10-CM | POA: Diagnosis not present

## 2015-03-18 DIAGNOSIS — Z1231 Encounter for screening mammogram for malignant neoplasm of breast: Secondary | ICD-10-CM | POA: Diagnosis not present

## 2015-03-21 DIAGNOSIS — M722 Plantar fascial fibromatosis: Secondary | ICD-10-CM | POA: Diagnosis not present

## 2015-03-25 ENCOUNTER — Other Ambulatory Visit: Payer: Self-pay | Admitting: Family Medicine

## 2015-03-25 DIAGNOSIS — M9979 Connective tissue and disc stenosis of intervertebral foramina of abdomen and other regions: Secondary | ICD-10-CM

## 2015-03-25 MED ORDER — HYDROCODONE-ACETAMINOPHEN 5-300 MG PO TABS: 1.0000 | ORAL_TABLET | Freq: Two times a day (BID) | ORAL | Status: DC | PRN

## 2015-03-25 NOTE — Telephone Encounter (Signed)
Prescription printed. Please notify patient it is ready for pick up. Thanks- Dr. Maedell Hedger.  

## 2015-03-25 NOTE — Telephone Encounter (Signed)
Pt needing a refill on the following prescription  Hydrocodone-Acetaminophen 5-300 MG TABS Taking 01/21/15 -- Margarita Rana, MD Take 1 tablet by mouth 2 (two) times daily as needed.

## 2015-03-27 ENCOUNTER — Encounter: Payer: Self-pay | Admitting: Family Medicine

## 2015-03-28 DIAGNOSIS — M25571 Pain in right ankle and joints of right foot: Secondary | ICD-10-CM | POA: Diagnosis not present

## 2015-03-28 DIAGNOSIS — M19071 Primary osteoarthritis, right ankle and foot: Secondary | ICD-10-CM | POA: Diagnosis not present

## 2015-03-28 DIAGNOSIS — M722 Plantar fascial fibromatosis: Secondary | ICD-10-CM | POA: Diagnosis not present

## 2015-04-05 ENCOUNTER — Other Ambulatory Visit: Payer: Self-pay | Admitting: Family Medicine

## 2015-04-05 DIAGNOSIS — E039 Hypothyroidism, unspecified: Secondary | ICD-10-CM

## 2015-04-08 ENCOUNTER — Other Ambulatory Visit: Payer: Self-pay | Admitting: Family Medicine

## 2015-04-08 DIAGNOSIS — G4700 Insomnia, unspecified: Secondary | ICD-10-CM

## 2015-04-09 NOTE — Telephone Encounter (Signed)
Printed, please fax or call in to pharmacy. Thank you.   

## 2015-05-05 ENCOUNTER — Other Ambulatory Visit: Payer: Self-pay | Admitting: Family Medicine

## 2015-05-05 DIAGNOSIS — I10 Essential (primary) hypertension: Secondary | ICD-10-CM

## 2015-05-06 ENCOUNTER — Encounter: Payer: Self-pay | Admitting: Neurology

## 2015-05-06 ENCOUNTER — Ambulatory Visit (INDEPENDENT_AMBULATORY_CARE_PROVIDER_SITE_OTHER): Payer: Medicare Other | Admitting: Neurology

## 2015-05-06 VITALS — BP 157/76 | HR 65 | Ht 63.0 in | Wt 177.6 lb

## 2015-05-06 DIAGNOSIS — R930 Abnormal findings on diagnostic imaging of skull and head, not elsewhere classified: Secondary | ICD-10-CM

## 2015-05-06 DIAGNOSIS — G43709 Chronic migraine without aura, not intractable, without status migrainosus: Secondary | ICD-10-CM

## 2015-05-06 DIAGNOSIS — R9082 White matter disease, unspecified: Secondary | ICD-10-CM

## 2015-05-06 DIAGNOSIS — G3184 Mild cognitive impairment, so stated: Secondary | ICD-10-CM

## 2015-05-06 NOTE — Patient Instructions (Signed)
Remember to drink plenty of fluid, eat healthy meals and do not skip any meals. Try to eat protein with a every meal and eat a healthy snack such as fruit or nuts in between meals. Try to keep a regular sleep-wake schedule and try to exercise daily, particularly in the form of walking, 20-30 minutes a day, if you can.   As far as your medications are concerned, I would like to suggest: Continue Aricept 5mg  daily  As far as diagnostic testing: none  I would like to see you back in 6 months, sooner if we need to. Please call us with any interim questions, concerns, problems, updates or refill requests.   Please also call us for any test results so we can go over those with you on the phone.  My clinical assistant and will answer any of your questions and relay your messages to me and also relay most of my messages to you.   Our phone number is [phone]. We also have an after hours call service for urgent matters and there is a physician on-call for urgent questions. For any emergencies you know to call 911 or go to the nearest emergency room

## 2015-05-06 NOTE — Progress Notes (Signed)
[facility]    Provider:  Dr Jaynee Eagles Referring Provider: Margarita Rana, MD Primary Care Physician:  Margarita Rana, MD  CC: Memory loss  Interval history 05/06/2015: She is having eye twitching with the Aricept, she had diarrhea with 10mg  and she went back to 5mg . Reviewed labs and MRI images with patient and compared them to 2010 MRI which is mostly stable. Showed chronic microvascular ischemic changes, discussed vascular risk factors and treatment.   She has migraines, every other day last 4-6 hours, unilateral, either side, throbbing and pulsatin with light sensitivity, sounds sensitivity, nausea no vomiting. No aura. No medication obveruse. On Topamax. On Atenolol. Failed Depakote, Pamelor and Elavil, she has failed imitrex oral and takes shots which may or may not help. She can't work, they can be severe 10/10 on average 5-6/10. At least 16 or more migraines a month. For the last 5-10 years or longer.   HPI: Cathy Barnes is a 75 y.o. female here as a referral from Dr. Venia Minks for memory loss. PMHx of migraine, HTN, essential tremor. She is a patient of Dr. [name] and Ward Givens and is seeing me today. Symptoms started within the last 2 months. She is having problems with numbers. She is having some short-term memory changes but most significantly with numbers. 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 She is scared, something is not right. Started acutely and getting worse. She took some prednisone for her foot and then this problem started. That may be a coincidence. She has stopped the steroids. No new medication changes, nothing new. No head trauma or inciting events. No other short-term memory changes except sometimes she forgets little things when she is very tired. Her TSH is monitored by pcp for hypothyroidism. No snoring at night, not  excessively tired during the day, she had a sleep test in the past. No alteration of consciousness. No other focal neurologic symptoms.   Review of Systems: Patient complains of symptoms per HPI as well as the following symptoms: ringing in ears, cramps, memory los, headache, dizziness, tremor. Pertinent negatives per HPI. All others negative.  Social History   Social History  . Marital Status: Divorced    Spouse Name: N/A  . Number of Children: 1  . Years of Education: college   Occupational History  . book keeping Other    [employer]   Social History Main Topics  . Smoking status: Never Smoker   . Smokeless tobacco: Never Used  . Alcohol Use: 0.0 oz/week    0 Standard drinks or equivalent per week     Comment: Rare occation   . Drug Use: No  . Sexual Activity: Not on file   Other Topics Concern  . Not on file   Social History Narrative   Patient is right handed, resides alone. Divorced.    Family History  Problem Relation Age of Onset  . Colon cancer Mother   . Hypertension Mother   . Colon cancer Father   . Heart disease Father   . Hypertension Father     Past Medical History  Diagnosis Date  . Migraine, unspecified, without mention of intractable migraine without mention of status migrainosus 12/14/2012  . Hyperglycemia   . Hyperlipidemia   . Hypertension   . Osteoarthritis, chronic     Past Surgical History  Procedure Laterality Date  . Partial hysterectomy    . Breast cyst aspiration Left 1993    Removed  . History of cataract surgery Right 2011  . Tubal ligation  1975    Bilateral  . S/p acl repair Left     knee    Current Outpatient Prescriptions  Medication Sig Dispense Refill  . ALPRAZolam (XANAX) 0.25 MG tablet take 2 tablets by mouth at bedtime 60 tablet 5  . aspirin 81 MG tablet Take 81 mg by mouth daily.    [name] atenolol (TENORMIN) 50 MG tablet take 1 tablet by mouth once daily 30 tablet 5  . BAYER CONTOUR TEST test strip     .  cyclobenzaprine (FLEXERIL) 10 MG tablet Take 1 tablet (10 mg total) by mouth at bedtime. 30 tablet 6  . D3-50 50000 UNITS capsule take 1 capsule EVERY MONTH 1 capsule 6  . donepezil (ARICEPT) 10 MG tablet Take 1 tablet (10 mg total) by mouth at bedtime. (Patient taking differently: Take 5 mg by mouth at bedtime. ) 30 tablet 11  . FLUZONE HIGH-DOSE injection Inject 180 mLs into the muscle once.    . gabapentin (NEURONTIN) 100 MG capsule Take 2 capsules (200 mg total) by mouth 2 (two) times daily. Take 2 tablet in am and 2 tablet at lunch. 360 capsule 1  . gabapentin (NEURONTIN) 300 MG capsule Take 1 capsule (300 mg total) by mouth at bedtime. 90 capsule 1  . Hydrocodone-Acetaminophen 5-300 MG TABS Take 1 tablet by mouth 2 (two) times daily as needed. 60 each 0  . levothyroxine (SYNTHROID, LEVOTHROID) 88 MCG tablet take 1 tablet by mouth once daily 90 tablet 1  . Levothyroxine Sodium 88 MCG CAPS Take by mouth daily before breakfast.    . losartan (COZAAR) 50 MG tablet take 1 tablet by mouth at bedtime 30 tablet 5  . potassium chloride SA (K-DUR,KLOR-CON) 20 MEQ tablet take 1 tablet by mouth twice a day 60 tablet 5  . SUMAtriptan 6 MG/0.5ML SOAJ Inject 6 mg into the skin once as needed. 2 cartridge 1  . topiramate (TOPAMAX) 100 MG tablet Take 1 tablet (100 mg total) by mouth every evening. 180 tablet 3  . topiramate (TOPAMAX) 25 MG tablet Take 1 tablet (25 mg total) by mouth every morning. 90 tablet 3  . triamterene-hydrochlorothiazide (MAXZIDE) 75-50 MG tablet TAKE 1/2 TABLET BY MOUTH ONCE DAILY 90 tablet 1  . verapamil (VERELAN PM) 120 MG 24 hr capsule Take 1 capsule (120 mg total) by mouth once. 90 capsule 1  . Vitamin D, Ergocalciferol, (DRISDOL) 50000 UNITS CAPS capsule Take 50,000 Units by mouth every 30 (thirty) days.    [name] ZETIA 10 MG tablet take 1 tablet by mouth at bedtime 30 tablet 5  . zolpidem (AMBIEN) 10 MG tablet take 1 tablet by mouth at bedtime 90 tablet 1   No current  facility-administered medications for this visit.    Allergies as of 05/06/2015 - Review Complete 05/06/2015  Allergen Reaction Noted  . Shellfish allergy  12/17/2014    Vitals: BP 157/76 mmHg  Pulse 65  Ht 5\' 3"  (1.6 m)  Wt 177 lb 9.6 oz (80.559 kg)  BMI 31.47 kg/m2 Last Weight:  Wt Readings from Last 1 Encounters:  05/06/15 177 lb 9.6 oz (80.559 kg)   Last Height:   Ht Readings from Last 1 Encounters:  05/06/15 5\' 3"  (1.6 m)   Montreal Cognitive Assessment  02/07/2015  Visuospatial/ Executive (0/5) 3  Naming (0/3)  2  Attention: Read list of digits (0/2) 2  Attention: Read list of letters (0/1) 1  Attention: Serial 7 subtraction starting at 100 (0/3) 1  Language: Repeat phrase (0/2) 0  Language : Fluency (0/1) 1  Abstraction (0/2) 2  Delayed Recall (0/5) 5  Orientation (0/6) 6  Total 23  Adjusted Score (based on education) 23     Cranial Nerves:  The pupils are equal, round, and reactive to light. Visual fields are full.. Extraocular movements are intact. The face is symmetric. Hearing intact. Voice is normal.  Gait:  Not ataxic  Motor Observation:  No asymmetry, no atrophy, and no involuntary movements noted. Tone:  Normal muscle tone.   Posture:  Posture is normal. normal erect   Assessment/Plan: 75 y.o. female here as a referral from Dr. Venia Minks for memory loss. MoCA 23/30, possibly mild cognitive impairment. Will order an MRI of the brain, B12, start Aricept. Could also very well be medication related as she is on a lot of medications that can cause cognitive problems such as topamax, flexeril, neurontin, alprazolam. Will decrease Topamax to 100mg  qhs. Discussed medications with patient.  MRi of the brain unremarkable, chronic microvascular ischemic white matter changes. Discussed vascular risk factors and trestment (BP control, diet and exercise, statins, watching glucose)  MCI: continue Aricept  Migraine: Will see if we can get botox  approved  Sarina Ill, MD  [facility] [address] [place],  [address]  Phone [phone] Fax [phone]  A total of 30 minutes was spent face-to-face with this patient. Over half this time was spent on counseling patient on the Mild cognitive impairment, chronic microvascular ischemic changes on MRI and migraine diagnosis and different diagnostic and therapeutic options available.

## 2015-05-16 ENCOUNTER — Encounter: Payer: Self-pay | Admitting: Physician Assistant

## 2015-05-16 ENCOUNTER — Ambulatory Visit (INDEPENDENT_AMBULATORY_CARE_PROVIDER_SITE_OTHER): Payer: 59 | Admitting: Physician Assistant

## 2015-05-16 VITALS — BP 140/66 | HR 57 | Temp 97.8°F | Resp 16 | Wt 177.6 lb

## 2015-05-16 DIAGNOSIS — R350 Frequency of micturition: Secondary | ICD-10-CM

## 2015-05-16 DIAGNOSIS — N3281 Overactive bladder: Secondary | ICD-10-CM | POA: Diagnosis not present

## 2015-05-16 LAB — POCT URINALYSIS DIPSTICK
Bilirubin, UA: NEGATIVE
Glucose, UA: NEGATIVE
KETONES UA: NEGATIVE
LEUKOCYTES UA: NEGATIVE
Nitrite, UA: NEGATIVE
PH UA: 7.5
PROTEIN UA: NEGATIVE
Spec Grav, UA: 1.01
Urobilinogen, UA: 0.2

## 2015-05-16 MED ORDER — SOLIFENACIN SUCCINATE 10 MG PO TABS: 5.0000 mg | ORAL_TABLET | Freq: Every day | ORAL | Status: DC

## 2015-05-16 NOTE — Progress Notes (Signed)
[name] Female    DOB: Oct 17, 1940   75 y.o.   MRN: [id number] Visit Date: 05/16/2015  Today's Provider: Mar Daring, PA-C   Chief Complaint  Patient presents with  . Urinary Tract Infection   Subjective:    Urinary Tract Infection  This is a new problem. The current episode started 1 to 4 weeks ago ($ weeks ago it started for her to go the bathroom more frequently and in the last 4 days she has been having incontinence.). The problem has been gradually worsening. The quality of the pain is described as burning. The patient is experiencing no pain. Associated symptoms include frequency, hesitancy and urgency. Pertinent negatives include no chills, flank pain or hematuria. Associated symptoms comments: Patient needs to wear a pad now. In the few days it has gotten worst. Cannot make it to the bathroom on time.. She has tried nothing for the symptoms. The treatment provided no relief.  She has had a hysterectomy many years ago and states that they did not do a bladder tack with her hysterectomy. She also states that her mother had similar issues but does not believe these issues started in her mother until she was in her 52s. She states her biggest complaint is the incontinence and inability to hold her urine.    Allergies  Allergen Reactions  . Shellfish Allergy     Throat closes, rash   Previous Medications   ALPRAZOLAM (XANAX) 0.25 MG TABLET    take 2 tablets by mouth at bedtime   ASPIRIN 81 MG TABLET    Take 81 mg by mouth daily.   ATENOLOL (TENORMIN) 50 MG TABLET    take 1 tablet by mouth once daily   BAYER CONTOUR TEST TEST STRIP       CYCLOBENZAPRINE (FLEXERIL) 10 MG TABLET    Take 1 tablet (10 mg total) by mouth at bedtime.   D3-50 50000 UNITS CAPSULE    take 1 capsule EVERY MONTH   DONEPEZIL (ARICEPT) 5 MG TABLET       FLUZONE HIGH-DOSE INJECTION    Inject 180 mLs into the muscle once.   GABAPENTIN (NEURONTIN) 100 MG CAPSULE    Take 2 capsules  (200 mg total) by mouth 2 (two) times daily. Take 2 tablet in am and 2 tablet at lunch.   GABAPENTIN (NEURONTIN) 300 MG CAPSULE    Take 1 capsule (300 mg total) by mouth at bedtime.   HYDROCODONE-ACETAMINOPHEN 5-300 MG TABS    Take 1 tablet by mouth 2 (two) times daily as needed.   LEVOTHYROXINE (SYNTHROID, LEVOTHROID) 88 MCG TABLET    take 1 tablet by mouth once daily   LEVOTHYROXINE SODIUM 88 MCG CAPS    Take by mouth daily before breakfast.   LOSARTAN (COZAAR) 50 MG TABLET    take 1 tablet by mouth at bedtime   POTASSIUM CHLORIDE SA (K-DUR,KLOR-CON) 20 MEQ TABLET    take 1 tablet by mouth twice a day   SUMATRIPTAN 6 MG/0.5ML SOAJ    Inject 6 mg into the skin once as needed.   TOPIRAMATE (TOPAMAX) 100 MG TABLET    Take 1 tablet (100 mg total) by mouth every evening.   TOPIRAMATE (TOPAMAX) 25 MG TABLET    Take 1 tablet (25 mg total) by mouth every morning.   TRIAMTERENE-HYDROCHLOROTHIAZIDE (MAXZIDE) 75-50 MG TABLET    TAKE 1/2 TABLET BY MOUTH ONCE DAILY   VERAPAMIL (VERELAN PM) 120 MG 24 HR  CAPSULE    Take 1 capsule (120 mg total) by mouth once.   VITAMIN D, ERGOCALCIFEROL, (DRISDOL) 50000 UNITS CAPS CAPSULE    Take 50,000 Units by mouth every 30 (thirty) days.   ZETIA 10 MG TABLET    take 1 tablet by mouth at bedtime   ZOLPIDEM (AMBIEN) 10 MG TABLET    take 1 tablet by mouth at bedtime    Review of Systems  Constitutional: Negative for fever and chills.  Respiratory: Negative.   Cardiovascular: Negative.   Gastrointestinal: Negative.   Endocrine: Negative.   Genitourinary: Positive for hesitancy, urgency, frequency and enuresis. Negative for dysuria, hematuria and flank pain.  Musculoskeletal: Negative.     Social History  Substance Use Topics  . Smoking status: Never Smoker   . Smokeless tobacco: Never Used  . Alcohol Use: 0.0 oz/week    0 Standard drinks or equivalent per week     Comment: Rare occation    Objective:   BP 140/66 mmHg  Pulse 57  Temp(Src) 97.8 F (36.6 C)  (Oral)  Resp 16  Wt 177 lb 9.6 oz (80.559 kg)  Physical Exam  Constitutional: She appears well-developed and well-nourished. No distress.  Neck: Normal range of motion. Neck supple. No JVD present. No tracheal deviation present. No thyromegaly present.  Cardiovascular: Normal rate, regular rhythm and normal heart sounds.  Exam reveals no gallop and no friction rub.   No murmur heard. Pulmonary/Chest: Effort normal and breath sounds normal. No respiratory distress. She has no wheezes. She has no rales.  Abdominal: Soft. Bowel sounds are normal. She exhibits no distension and no mass. There is no tenderness. There is no rebound and no guarding.  Genitourinary:  Deferred until next visit if she fails medication.  Lymphadenopathy:    She has no cervical adenopathy.  Skin: She is not diaphoretic.  Vitals reviewed.       Assessment & Plan:     1. OAB (overactive bladder) I did discuss with her today that her symptoms could be possibly overactive bladder versus a cystocele with incomplete emptying. She would like to try the medication for overactive bladder first before checking for cystocele. I did agree with this and gave her a sample of Vesicare 10 mg. She is to cut this in half and take 1 pill once daily. I will see her back in 2 weeks to evaluate how she is doing with the medication. I did advise her of adverse reactions and if she is to have any of them for her to stop taking the medication and call the office. She voiced understanding. If she does not respond to the medication we will do a pelvic exam in 2 weeks to evaluate for cystocele. I did also discuss options of treatment for cystocele if that ends up being the official diagnosis and she voiced understanding and states that she will think about them over the next 2 weeks. I will see her back in 2 weeks and she is to call the office if she has any worsening symptoms in the meantime.  2. Frequent urination UA was negative for UTI  today. - POCT urinalysis dipstick       Mar Daring, PA-C  [facility]

## 2015-05-16 NOTE — Patient Instructions (Signed)
Overactive Bladder, Adult Overactive bladder is a group of urinary symptoms. With overactive bladder, you may suddenly feel the need to pass urine (urinate) right away. After feeling this sudden urge, you might also leak urine if you cannot get to the bathroom fast enough (urinary incontinence). These symptoms might interfere with your daily work or social activities. Overactive bladder symptoms may also wake you up at night. Overactive bladder affects the nerve signals between your bladder and your brain. Your bladder may get the signal to empty before it is full. Very sensitive muscles can also make your bladder squeeze too soon. CAUSES Many things can cause an overactive bladder. Possible causes include:  Urinary tract infection.  Infection of nearby tissues, such as the prostate.  Prostate enlargement.  Being pregnant with twins or more (multiples).  Surgery on the uterus or urethra.  Bladder stones, inflammation, or tumors.  Drinking too much caffeine or alcohol.  Certain medicines, especially those that you take to help your body get rid of extra fluid (diuretics) by increasing urine production.  Muscle or nerve weakness, especially from:  A spinal cord injury.  Stroke.  Multiple sclerosis.  Parkinson disease.  Diabetes. This can cause a high urine volume that fills the bladder so quickly that the normal urge to urinate is triggered very strongly.  Constipation. A buildup of too much stool can put pressure on your bladder. RISK FACTORS You may be at greater risk for overactive bladder if you:  Are an older adult.  Smoke.  Are going through menopause.  Have prostate problems.  Have a neurological disease, such as stroke, dementia, Parkinson disease, or multiple sclerosis (MS).  Eat or drink things that irritate the bladder. These include alcohol, spicy food, and caffeine.  Are overweight or obese. SIGNS AND SYMPTOMS  The signs and symptoms of an overactive  bladder include:  Sudden, strong urges to urinate.  Leaking urine.  Urinating eight or more times per day.  Waking up to urinate two or more times per night. DIAGNOSIS Your health care provider may suspect overactive bladder based on your symptoms. The health care provider will do a physical exam and take your medical history. Blood or urine tests may also be done. For example, you might need to have a bladder function test to check how well you can hold your urine. You might also need to see a health care provider who specializes in the urinary tract (urologist). TREATMENT Treatment for overactive bladder depends on the cause of your condition and whether it is mild or severe. Certain treatments can be done in your health care provider's office or clinic. You can also make lifestyle changes at home. Options include: Behavioral Treatments  Biofeedback. A specialist uses sensors to help you become aware of your body's signals.  Keeping a daily log of when you need to urinate and what happens after the urge. This may help you manage your condition.  Bladder training. This helps you learn to control the urge to urinate by following a schedule that directs you to urinate at regular intervals (timed voiding). At first, you might have to wait a few minutes after feeling the urge. In time, you should be able to schedule bathroom visits an hour or more apart.  Kegel exercises. These are exercises to strengthen the pelvic floor muscles, which support the bladder. Toning these muscles can help you control urination, even if your bladder muscles are overactive. A specialist will teach you how to do these exercises correctly. They   require daily practice.  Weight loss. If you are obese or overweight, losing weight might relieve your symptoms of overactive bladder. Talk to your health care provider about losing weight and whether there is a specific program or method that would work best for you.  Diet  change. This might help if constipation is making your overactive bladder worse. Your health care provider or a dietitian can explain ways to change what you eat to ease constipation. You might also need to consume less alcohol and caffeine or drink other fluids at different times of the day.  Stopping smoking.  Wearing pads to absorb leakage while you wait for other treatments to take effect. Physical Treatments  Electrical stimulation. Electrodes send gentle pulses of electricity to strengthen the nerves or muscles that help to control the bladder. Sometimes, the electrodes are placed outside of the body. In other cases, they might be placed inside the body (implanted). This treatment can take several months to have an effect.  Supportive devices. Women may need a plastic device that fits into the vagina and supports the bladder (pessary). Medicines Several medicines can help treat overactive bladder and are usually used along with other treatments. Some are injected into the muscles involved in urination. Others come in pill form. Your health care provider may prescribe:  Antispasmodics. These medicines block the signals that the nerves send to the bladder. This keeps the bladder from releasing urine at the wrong time.  Tricyclic antidepressants. These types of antidepressants also relax bladder muscles. Surgery  You may have a device implanted to help manage the nerve signals that indicate when you need to urinate.  You may have surgery to implant electrodes for electrical stimulation.  Sometimes, very severe cases of overactive bladder require surgery to change the shape of the bladder. HOME CARE INSTRUCTIONS   Take medicines only as directed by your health care provider.  Use any implants or a pessary as directed by your health care provider.  Make any diet or lifestyle changes that are recommended by your health care provider. These might include:  Drinking less fluid or  drinking at different times of the day. If you need to urinate often during the night, you may need to stop drinking fluids early in the evening.  Cutting down on caffeine or alcohol. Both can make an overactive bladder worse. Caffeine is found in coffee, tea, and sodas.  Doing Kegel exercises to strengthen muscles.  Losing weight if you need to.  Eating a healthy and balanced diet to prevent constipation.  Keep a journal or log to track how much and when you drink and also when you feel the need to urinate. This will help your health care provider to monitor your condition. SEEK MEDICAL CARE IF:  Your symptoms do not get better after treatment.  Your pain and discomfort are getting worse.  You have more frequent urges to urinate.  You have a fever. SEEK IMMEDIATE MEDICAL CARE IF: You are not able to control your bladder at all.   This information is not intended to replace advice given to you by your health care provider. Make sure you discuss any questions you have with your health care provider.   Document Released: 01/31/2009 Document Revised: 04/27/2014 Document Reviewed: 08/30/2013 Elsevier Interactive Patient Education 2016 [employer]  

## 2015-05-30 ENCOUNTER — Encounter: Payer: Self-pay | Admitting: Physician Assistant

## 2015-05-30 ENCOUNTER — Ambulatory Visit (INDEPENDENT_AMBULATORY_CARE_PROVIDER_SITE_OTHER): Payer: 59 | Admitting: Physician Assistant

## 2015-05-30 VITALS — BP 120/62 | HR 60 | Temp 98.6°F | Resp 16 | Wt 178.0 lb

## 2015-05-30 DIAGNOSIS — N3281 Overactive bladder: Secondary | ICD-10-CM | POA: Insufficient documentation

## 2015-05-30 MED ORDER — SOLIFENACIN SUCCINATE 10 MG PO TABS: 5.0000 mg | ORAL_TABLET | Freq: Every day | ORAL | Status: DC

## 2015-05-30 NOTE — Patient Instructions (Signed)
Overactive Bladder, Adult Overactive bladder is a group of urinary symptoms. With overactive bladder, you may suddenly feel the need to pass urine (urinate) right away. After feeling this sudden urge, you might also leak urine if you cannot get to the bathroom fast enough (urinary incontinence). These symptoms might interfere with your daily work or social activities. Overactive bladder symptoms may also wake you up at night. Overactive bladder affects the nerve signals between your bladder and your brain. Your bladder may get the signal to empty before it is full. Very sensitive muscles can also make your bladder squeeze too soon. CAUSES Many things can cause an overactive bladder. Possible causes include:  Urinary tract infection.  Infection of nearby tissues, such as the prostate.  Prostate enlargement.  Being pregnant with twins or more (multiples).  Surgery on the uterus or urethra.  Bladder stones, inflammation, or tumors.  Drinking too much caffeine or alcohol.  Certain medicines, especially those that you take to help your body get rid of extra fluid (diuretics) by increasing urine production.  Muscle or nerve weakness, especially from:  A spinal cord injury.  Stroke.  Multiple sclerosis.  Parkinson disease.  Diabetes. This can cause a high urine volume that fills the bladder so quickly that the normal urge to urinate is triggered very strongly.  Constipation. A buildup of too much stool can put pressure on your bladder. RISK FACTORS You may be at greater risk for overactive bladder if you:  Are an older adult.  Smoke.  Are going through menopause.  Have prostate problems.  Have a neurological disease, such as stroke, dementia, Parkinson disease, or multiple sclerosis (MS).  Eat or drink things that irritate the bladder. These include alcohol, spicy food, and caffeine.  Are overweight or obese. SIGNS AND SYMPTOMS  The signs and symptoms of an overactive  bladder include:  Sudden, strong urges to urinate.  Leaking urine.  Urinating eight or more times per day.  Waking up to urinate two or more times per night. DIAGNOSIS Your health care provider may suspect overactive bladder based on your symptoms. The health care provider will do a physical exam and take your medical history. Blood or urine tests may also be done. For example, you might need to have a bladder function test to check how well you can hold your urine. You might also need to see a health care provider who specializes in the urinary tract (urologist). TREATMENT Treatment for overactive bladder depends on the cause of your condition and whether it is mild or severe. Certain treatments can be done in your health care provider's office or clinic. You can also make lifestyle changes at home. Options include: Behavioral Treatments  Biofeedback. A specialist uses sensors to help you become aware of your body's signals.  Keeping a daily log of when you need to urinate and what happens after the urge. This may help you manage your condition.  Bladder training. This helps you learn to control the urge to urinate by following a schedule that directs you to urinate at regular intervals (timed voiding). At first, you might have to wait a few minutes after feeling the urge. In time, you should be able to schedule bathroom visits an hour or more apart.  Kegel exercises. These are exercises to strengthen the pelvic floor muscles, which support the bladder. Toning these muscles can help you control urination, even if your bladder muscles are overactive. A specialist will teach you how to do these exercises correctly. They   require daily practice.  Weight loss. If you are obese or overweight, losing weight might relieve your symptoms of overactive bladder. Talk to your health care provider about losing weight and whether there is a specific program or method that would work best for you.  Diet  change. This might help if constipation is making your overactive bladder worse. Your health care provider or a dietitian can explain ways to change what you eat to ease constipation. You might also need to consume less alcohol and caffeine or drink other fluids at different times of the day.  Stopping smoking.  Wearing pads to absorb leakage while you wait for other treatments to take effect. Physical Treatments  Electrical stimulation. Electrodes send gentle pulses of electricity to strengthen the nerves or muscles that help to control the bladder. Sometimes, the electrodes are placed outside of the body. In other cases, they might be placed inside the body (implanted). This treatment can take several months to have an effect.  Supportive devices. Women may need a plastic device that fits into the vagina and supports the bladder (pessary). Medicines Several medicines can help treat overactive bladder and are usually used along with other treatments. Some are injected into the muscles involved in urination. Others come in pill form. Your health care provider may prescribe:  Antispasmodics. These medicines block the signals that the nerves send to the bladder. This keeps the bladder from releasing urine at the wrong time.  Tricyclic antidepressants. These types of antidepressants also relax bladder muscles. Surgery  You may have a device implanted to help manage the nerve signals that indicate when you need to urinate.  You may have surgery to implant electrodes for electrical stimulation.  Sometimes, very severe cases of overactive bladder require surgery to change the shape of the bladder. HOME CARE INSTRUCTIONS   Take medicines only as directed by your health care provider.  Use any implants or a pessary as directed by your health care provider.  Make any diet or lifestyle changes that are recommended by your health care provider. These might include:  Drinking less fluid or  drinking at different times of the day. If you need to urinate often during the night, you may need to stop drinking fluids early in the evening.  Cutting down on caffeine or alcohol. Both can make an overactive bladder worse. Caffeine is found in coffee, tea, and sodas.  Doing Kegel exercises to strengthen muscles.  Losing weight if you need to.  Eating a healthy and balanced diet to prevent constipation.  Keep a journal or log to track how much and when you drink and also when you feel the need to urinate. This will help your health care provider to monitor your condition. SEEK MEDICAL CARE IF:  Your symptoms do not get better after treatment.  Your pain and discomfort are getting worse.  You have more frequent urges to urinate.  You have a fever. SEEK IMMEDIATE MEDICAL CARE IF: You are not able to control your bladder at all.   This information is not intended to replace advice given to you by your health care provider. Make sure you discuss any questions you have with your health care provider.   Document Released: 01/31/2009 Document Revised: 04/27/2014 Document Reviewed: 08/30/2013 Elsevier Interactive Patient Education 2016 [employer]  

## 2015-05-30 NOTE — Progress Notes (Signed)
Patient ID: Cathy Barnes, female   DOB: 17-Jul-1940, 75 y.o.   MRN: [id number]       [name] Female    DOB: 01/27/1941   75 y.o.   MRN: [id number] Visit Date: 05/30/2015  Today's Provider: Mar Daring, PA-C   Chief Complaint  Patient presents with  . overactive bladder    Follow up    Subjective:    HPI Overactive Bladder:  Patient comes in today to F/U on overactive bladder symptoms. She was started on Vesicare 5mg  and she denies any side effects. She also reports that it seems to be helping her symptoms. She is also using timed toileting and increased her water intake as we discussed. She reports that it is not 100% better, but it has improved significantly.     Allergies  Allergen Reactions  . Shellfish Allergy     Throat closes, rash   Previous Medications   ALPRAZOLAM (XANAX) 0.25 MG TABLET    take 2 tablets by mouth at bedtime   ASPIRIN 81 MG TABLET    Take 81 mg by mouth daily.   ATENOLOL (TENORMIN) 50 MG TABLET    take 1 tablet by mouth once daily   BAYER CONTOUR TEST TEST STRIP       CYCLOBENZAPRINE (FLEXERIL) 10 MG TABLET    Take 1 tablet (10 mg total) by mouth at bedtime.   D3-50 50000 UNITS CAPSULE    take 1 capsule EVERY MONTH   DONEPEZIL (ARICEPT) 5 MG TABLET       FLUZONE HIGH-DOSE INJECTION    Inject 180 mLs into the muscle once.   GABAPENTIN (NEURONTIN) 100 MG CAPSULE    Take 2 capsules (200 mg total) by mouth 2 (two) times daily. Take 2 tablet in am and 2 tablet at lunch.   GABAPENTIN (NEURONTIN) 300 MG CAPSULE    Take 1 capsule (300 mg total) by mouth at bedtime.   HYDROCODONE-ACETAMINOPHEN 5-300 MG TABS    Take 1 tablet by mouth 2 (two) times daily as needed.   LEVOTHYROXINE (SYNTHROID, LEVOTHROID) 88 MCG TABLET    take 1 tablet by mouth once daily   LEVOTHYROXINE SODIUM 88 MCG CAPS    Take by mouth daily before breakfast.   LOSARTAN (COZAAR) 50 MG TABLET    take 1 tablet by mouth at bedtime   POTASSIUM CHLORIDE SA (K-DUR,KLOR-CON)  20 MEQ TABLET    take 1 tablet by mouth twice a day   SOLIFENACIN (VESICARE) 10 MG TABLET    Take 0.5 tablets (5 mg total) by mouth daily.   SUMATRIPTAN 6 MG/0.5ML SOAJ    Inject 6 mg into the skin once as needed.   TOPIRAMATE (TOPAMAX) 100 MG TABLET    Take 1 tablet (100 mg total) by mouth every evening.   TOPIRAMATE (TOPAMAX) 25 MG TABLET    Take 1 tablet (25 mg total) by mouth every morning.   TRIAMTERENE-HYDROCHLOROTHIAZIDE (MAXZIDE) 75-50 MG TABLET    TAKE 1/2 TABLET BY MOUTH ONCE DAILY   VERAPAMIL (VERELAN PM) 120 MG 24 HR CAPSULE    Take 1 capsule (120 mg total) by mouth once.   VITAMIN D, ERGOCALCIFEROL, (DRISDOL) 50000 UNITS CAPS CAPSULE    Take 50,000 Units by mouth every 30 (thirty) days.   ZETIA 10 MG TABLET    take 1 tablet by mouth at bedtime   ZOLPIDEM (AMBIEN) 10 MG TABLET    take 1 tablet by mouth at bedtime    Review  of Systems  Constitutional: Negative.   Respiratory: Negative.   Cardiovascular: Negative.   Genitourinary: Positive for urgency, frequency and pelvic pain. Negative for dysuria, hematuria, flank pain, decreased urine volume, vaginal bleeding, vaginal discharge, difficulty urinating and vaginal pain.    Social History  Substance Use Topics  . Smoking status: Never Smoker   . Smokeless tobacco: Never Used  . Alcohol Use: 0.0 oz/week    0 Standard drinks or equivalent per week     Comment: Rare occation    Objective:   BP 120/62 mmHg  Pulse 60  Temp(Src) 98.6 F (37 C)  Resp 16  Wt 178 lb (80.74 kg)  Physical Exam  Constitutional: She appears well-developed and well-nourished. No distress.  Neck: Normal range of motion. Neck supple. No tracheal deviation present. No thyromegaly present.  Cardiovascular: Normal rate, regular rhythm and normal heart sounds.  Exam reveals no gallop and no friction rub.   No murmur heard. Pulmonary/Chest: Effort normal and breath sounds normal. No respiratory distress. She has no wheezes. She has no rales.  Abdominal:  Soft. Bowel sounds are normal. She exhibits no distension and no mass. There is no tenderness. There is no rebound and no guarding.  Lymphadenopathy:    She has no cervical adenopathy.  Skin: She is not diaphoretic.  Vitals reviewed.       Assessment & Plan:     1. Overactive bladder We'll continue current treatment with Vesicare 5 mg. She is going to continue the 5 mg for another week or 2 and we'll do a trial of the Vesicare 10 mg to see if this gives her complete resolution of her symptoms. If so she may continue to take the 10 mg. If not she can start taking the 5 mg again instead. Did advise her of warning signs with this medication such as dehydration and inability to urinate. She is to call the office if any of these occur. I will see her back in 5 weeks to see how she is doing. May consider trial of discontinuation of the medication at that time if symptoms are completely resolved. - solifenacin (VESICARE) 10 MG tablet; Take 0.5 tablets (5 mg total) by mouth daily.  Dispense: 30 tablet; Refill: 0       Mar Daring, PA-C  [facility]

## 2015-06-08 ENCOUNTER — Other Ambulatory Visit: Payer: Self-pay | Admitting: Family Medicine

## 2015-06-08 DIAGNOSIS — I10 Essential (primary) hypertension: Secondary | ICD-10-CM

## 2015-06-11 ENCOUNTER — Encounter: Payer: Self-pay | Admitting: *Deleted

## 2015-06-11 NOTE — Progress Notes (Signed)
Sent rx verapamil 120 mg cap daily po refill request (3 refills) back to [facility]. [phone]. Received confirmation.

## 2015-06-17 ENCOUNTER — Ambulatory Visit: Payer: Medicare PPO | Admitting: Adult Health

## 2015-06-28 ENCOUNTER — Other Ambulatory Visit: Payer: Self-pay | Admitting: Family Medicine

## 2015-06-28 DIAGNOSIS — E559 Vitamin D deficiency, unspecified: Secondary | ICD-10-CM

## 2015-07-03 ENCOUNTER — Other Ambulatory Visit: Payer: Self-pay | Admitting: Family Medicine

## 2015-07-03 DIAGNOSIS — M9979 Connective tissue and disc stenosis of intervertebral foramina of abdomen and other regions: Secondary | ICD-10-CM

## 2015-07-03 MED ORDER — HYDROCODONE-ACETAMINOPHEN 5-300 MG PO TABS: 1.0000 | ORAL_TABLET | Freq: Two times a day (BID) | ORAL | Status: DC | PRN

## 2015-07-03 NOTE — Telephone Encounter (Signed)
Pt contacted office for refill request on the following medications:  Hydrocodone-Acetaminophen 5-300 MG TABS.  [id number]

## 2015-07-03 NOTE — Telephone Encounter (Signed)
Prescription printed. Please notify patient it is ready for pick up. Thanks- Dr. Verlie Liotta.  

## 2015-07-04 ENCOUNTER — Ambulatory Visit (INDEPENDENT_AMBULATORY_CARE_PROVIDER_SITE_OTHER): Payer: Medicare Other | Admitting: Physician Assistant

## 2015-07-04 ENCOUNTER — Encounter: Payer: Self-pay | Admitting: Physician Assistant

## 2015-07-04 VITALS — BP 130/62 | HR 58 | Temp 98.6°F | Resp 16 | Wt 182.0 lb

## 2015-07-04 DIAGNOSIS — N3281 Overactive bladder: Secondary | ICD-10-CM

## 2015-07-04 MED ORDER — SOLIFENACIN SUCCINATE 10 MG PO TABS: 5.0000 mg | ORAL_TABLET | Freq: Every day | ORAL | Status: DC

## 2015-07-04 NOTE — Patient Instructions (Signed)
Overactive Bladder, Adult Overactive bladder is a group of urinary symptoms. With overactive bladder, you may suddenly feel the need to pass urine (urinate) right away. After feeling this sudden urge, you might also leak urine if you cannot get to the bathroom fast enough (urinary incontinence). These symptoms might interfere with your daily work or social activities. Overactive bladder symptoms may also wake you up at night. Overactive bladder affects the nerve signals between your bladder and your brain. Your bladder may get the signal to empty before it is full. Very sensitive muscles can also make your bladder squeeze too soon. CAUSES Many things can cause an overactive bladder. Possible causes include:  Urinary tract infection.  Infection of nearby tissues, such as the prostate.  Prostate enlargement.  Being pregnant with twins or more (multiples).  Surgery on the uterus or urethra.  Bladder stones, inflammation, or tumors.  Drinking too much caffeine or alcohol.  Certain medicines, especially those that you take to help your body get rid of extra fluid (diuretics) by increasing urine production.  Muscle or nerve weakness, especially from:  A spinal cord injury.  Stroke.  Multiple sclerosis.  Parkinson disease.  Diabetes. This can cause a high urine volume that fills the bladder so quickly that the normal urge to urinate is triggered very strongly.  Constipation. A buildup of too much stool can put pressure on your bladder. RISK FACTORS You may be at greater risk for overactive bladder if you:  Are an older adult.  Smoke.  Are going through menopause.  Have prostate problems.  Have a neurological disease, such as stroke, dementia, Parkinson disease, or multiple sclerosis (MS).  Eat or drink things that irritate the bladder. These include alcohol, spicy food, and caffeine.  Are overweight or obese. SIGNS AND SYMPTOMS  The signs and symptoms of an overactive  bladder include:  Sudden, strong urges to urinate.  Leaking urine.  Urinating eight or more times per day.  Waking up to urinate two or more times per night. DIAGNOSIS Your health care provider may suspect overactive bladder based on your symptoms. The health care provider will do a physical exam and take your medical history. Blood or urine tests may also be done. For example, you might need to have a bladder function test to check how well you can hold your urine. You might also need to see a health care provider who specializes in the urinary tract (urologist). TREATMENT Treatment for overactive bladder depends on the cause of your condition and whether it is mild or severe. Certain treatments can be done in your health care provider's office or clinic. You can also make lifestyle changes at home. Options include: Behavioral Treatments  Biofeedback. A specialist uses sensors to help you become aware of your body's signals.  Keeping a daily log of when you need to urinate and what happens after the urge. This may help you manage your condition.  Bladder training. This helps you learn to control the urge to urinate by following a schedule that directs you to urinate at regular intervals (timed voiding). At first, you might have to wait a few minutes after feeling the urge. In time, you should be able to schedule bathroom visits an hour or more apart.  Kegel exercises. These are exercises to strengthen the pelvic floor muscles, which support the bladder. Toning these muscles can help you control urination, even if your bladder muscles are overactive. A specialist will teach you how to do these exercises correctly. They   require daily practice.  Weight loss. If you are obese or overweight, losing weight might relieve your symptoms of overactive bladder. Talk to your health care provider about losing weight and whether there is a specific program or method that would work best for you.  Diet  change. This might help if constipation is making your overactive bladder worse. Your health care provider or a dietitian can explain ways to change what you eat to ease constipation. You might also need to consume less alcohol and caffeine or drink other fluids at different times of the day.  Stopping smoking.  Wearing pads to absorb leakage while you wait for other treatments to take effect. Physical Treatments  Electrical stimulation. Electrodes send gentle pulses of electricity to strengthen the nerves or muscles that help to control the bladder. Sometimes, the electrodes are placed outside of the body. In other cases, they might be placed inside the body (implanted). This treatment can take several months to have an effect.  Supportive devices. Women may need a plastic device that fits into the vagina and supports the bladder (pessary). Medicines Several medicines can help treat overactive bladder and are usually used along with other treatments. Some are injected into the muscles involved in urination. Others come in pill form. Your health care provider may prescribe:  Antispasmodics. These medicines block the signals that the nerves send to the bladder. This keeps the bladder from releasing urine at the wrong time.  Tricyclic antidepressants. These types of antidepressants also relax bladder muscles. Surgery  You may have a device implanted to help manage the nerve signals that indicate when you need to urinate.  You may have surgery to implant electrodes for electrical stimulation.  Sometimes, very severe cases of overactive bladder require surgery to change the shape of the bladder. HOME CARE INSTRUCTIONS   Take medicines only as directed by your health care provider.  Use any implants or a pessary as directed by your health care provider.  Make any diet or lifestyle changes that are recommended by your health care provider. These might include:  Drinking less fluid or  drinking at different times of the day. If you need to urinate often during the night, you may need to stop drinking fluids early in the evening.  Cutting down on caffeine or alcohol. Both can make an overactive bladder worse. Caffeine is found in coffee, tea, and sodas.  Doing Kegel exercises to strengthen muscles.  Losing weight if you need to.  Eating a healthy and balanced diet to prevent constipation.  Keep a journal or log to track how much and when you drink and also when you feel the need to urinate. This will help your health care provider to monitor your condition. SEEK MEDICAL CARE IF:  Your symptoms do not get better after treatment.  Your pain and discomfort are getting worse.  You have more frequent urges to urinate.  You have a fever. SEEK IMMEDIATE MEDICAL CARE IF: You are not able to control your bladder at all.   This information is not intended to replace advice given to you by your health care provider. Make sure you discuss any questions you have with your health care provider.   Document Released: 01/31/2009 Document Revised: 04/27/2014 Document Reviewed: 08/30/2013 Elsevier Interactive Patient Education 2016 [employer]  

## 2015-07-04 NOTE — Progress Notes (Signed)
Patient ID: Cathy Barnes, female   DOB: 05-Sep-1940, 75 y.o.   MRN: [id number]       [name] Female    DOB: Jan 05, 1941   75 y.o.   MRN: [id number] Visit Date: 07/04/2015  Today's Provider: Mar Daring, PA-C   Chief Complaint  Patient presents with  . overactive bladder    5 week F/U.    Subjective:    HPI Overactive bladder Patient comes in today to F/U on overactive bladder. She was started on Vesicare 5mg  and was advised to increase to 10mg  daily. Patient reports that the increased dose has helped tremendously. Patient reports that her symptoms have completely resolved.     Allergies  Allergen Reactions  . Shellfish Allergy     Throat closes, rash   Previous Medications   ALPRAZOLAM (XANAX) 0.25 MG TABLET    take 2 tablets by mouth at bedtime   ASPIRIN 81 MG TABLET    Take 81 mg by mouth daily.   ATENOLOL (TENORMIN) 50 MG TABLET    take 1 tablet by mouth once daily   BAYER CONTOUR TEST TEST STRIP       CYCLOBENZAPRINE (FLEXERIL) 10 MG TABLET    Take 1 tablet (10 mg total) by mouth at bedtime.   D3-50 50000 UNITS CAPSULE    take 1 capsule every month   DONEPEZIL (ARICEPT) 5 MG TABLET       FLUZONE HIGH-DOSE INJECTION    Inject 180 mLs into the muscle once.   GABAPENTIN (NEURONTIN) 100 MG CAPSULE    Take 2 capsules (200 mg total) by mouth 2 (two) times daily. Take 2 tablet in am and 2 tablet at lunch.   GABAPENTIN (NEURONTIN) 300 MG CAPSULE    Take 1 capsule (300 mg total) by mouth at bedtime.   HYDROCODONE-ACETAMINOPHEN 5-300 MG TABS    Take 1 tablet by mouth 2 (two) times daily as needed.   LEVOTHYROXINE (SYNTHROID, LEVOTHROID) 88 MCG TABLET    take 1 tablet by mouth once daily   LEVOTHYROXINE SODIUM 88 MCG CAPS    Take by mouth daily before breakfast.   LOSARTAN (COZAAR) 50 MG TABLET    take 1 tablet by mouth at bedtime   POTASSIUM CHLORIDE SA (K-DUR,KLOR-CON) 20 MEQ TABLET    take 1 tablet by mouth twice a day   SOLIFENACIN (VESICARE) 10 MG  TABLET    Take 0.5 tablets (5 mg total) by mouth daily.   SUMATRIPTAN 6 MG/0.5ML SOAJ    Inject 6 mg into the skin once as needed.   TOPIRAMATE (TOPAMAX) 100 MG TABLET    Take 1 tablet (100 mg total) by mouth every evening.   TOPIRAMATE (TOPAMAX) 25 MG TABLET    Take 1 tablet (25 mg total) by mouth every morning.   TRIAMTERENE-HYDROCHLOROTHIAZIDE (MAXZIDE) 75-50 MG TABLET    TAKE 1/2 TABLET BY MOUTH ONCE DAILY   VERAPAMIL (VERELAN PM) 120 MG 24 HR CAPSULE    Take 1 capsule (120 mg total) by mouth once.   VITAMIN D, ERGOCALCIFEROL, (DRISDOL) 50000 UNITS CAPS CAPSULE    Take 50,000 Units by mouth every 30 (thirty) days.   ZETIA 10 MG TABLET    take 1 tablet by mouth at bedtime   ZOLPIDEM (AMBIEN) 10 MG TABLET    take 1 tablet by mouth at bedtime    Review of Systems  Constitutional: Negative.   Respiratory: Negative.   Cardiovascular: Negative.   Gastrointestinal: Negative.   Genitourinary:  Negative.   Neurological: Negative.     Social History  Substance Use Topics  . Smoking status: Never Smoker   . Smokeless tobacco: Never Used  . Alcohol Use: 0.0 oz/week    0 Standard drinks or equivalent per week     Comment: Rare occation    Objective:   BP 130/62 mmHg  Pulse 58  Temp(Src) 98.6 F (37 C)  Resp 16  Wt 182 lb (82.555 kg)  Physical Exam  Constitutional: She appears well-developed and well-nourished. No distress.  Cardiovascular: Normal rate, regular rhythm and normal heart sounds.  Exam reveals no gallop and no friction rub.   No murmur heard. Pulmonary/Chest: Effort normal and breath sounds normal. No respiratory distress. She has no wheezes. She has no rales.  Abdominal: Soft. Bowel sounds are normal. She exhibits no distension and no mass. There is no tenderness. There is no rebound and no guarding.  Skin: She is not diaphoretic.  Vitals reviewed.       Assessment & Plan:     1. Overactive bladder Will continue current medical treatment since she has had  improvement. She has had some weight gain, but I discussed with her this medication does not cause weight gain but can cause constipation which may add to weight gain.  She states she has noticed this some but not severe enough for her to use a laxative yet.  Advised she could use a stool softener and/or fiber supplement to help.  She agrees.  Also discussed using the 5mg  again to see if symptoms are still controlled with lower dose since she had less side effects from the 5mg  dose. She states she will try, but if leakage starts again she is going to continue with the 10mg  dose. She is to call the office if symptoms worsen.  I will see her back in 3 months for recheck. - solifenacin (VESICARE) 10 MG tablet; Take 0.5 tablets (5 mg total) by mouth daily.  Dispense: 30 tablet; Refill: [place], PA-C  [facility]

## 2015-07-23 ENCOUNTER — Other Ambulatory Visit: Payer: Self-pay | Admitting: Family Medicine

## 2015-07-23 DIAGNOSIS — G4700 Insomnia, unspecified: Secondary | ICD-10-CM

## 2015-07-23 NOTE — Telephone Encounter (Signed)
Printed, please fax or call in to pharmacy. Thank you.   

## 2015-07-23 NOTE — Telephone Encounter (Signed)
Called in to pharmacy. Emily Drozdowski, CMA  

## 2015-07-27 ENCOUNTER — Other Ambulatory Visit: Payer: Self-pay | Admitting: Family Medicine

## 2015-07-27 DIAGNOSIS — E7800 Pure hypercholesterolemia, unspecified: Secondary | ICD-10-CM

## 2015-07-29 NOTE — Telephone Encounter (Signed)
02/14/14 last lipid

## 2015-08-16 ENCOUNTER — Other Ambulatory Visit: Payer: Self-pay | Admitting: Family Medicine

## 2015-08-16 ENCOUNTER — Encounter: Payer: Self-pay | Admitting: Family Medicine

## 2015-08-16 ENCOUNTER — Ambulatory Visit (INDEPENDENT_AMBULATORY_CARE_PROVIDER_SITE_OTHER): Payer: Medicare Other | Admitting: Family Medicine

## 2015-08-16 ENCOUNTER — Ambulatory Visit
Admission: RE | Admit: 2015-08-16 | Discharge: 2015-08-16 | Disposition: A | Discharge status: Home / Self Care | Payer: Medicare Other | Source: Ambulatory Visit | Attending: Family Medicine | Admitting: Family Medicine

## 2015-08-16 VITALS — BP 148/68 | HR 76 | Temp 97.8°F | Resp 16 | Wt 183.0 lb

## 2015-08-16 DIAGNOSIS — M13171 Monoarthritis, not elsewhere classified, right ankle and foot: Secondary | ICD-10-CM | POA: Insufficient documentation

## 2015-08-16 DIAGNOSIS — M79671 Pain in right foot: Secondary | ICD-10-CM

## 2015-08-16 DIAGNOSIS — M7989 Other specified soft tissue disorders: Secondary | ICD-10-CM

## 2015-08-16 DIAGNOSIS — M13179 Monoarthritis, not elsewhere classified, unspecified ankle and foot: Secondary | ICD-10-CM | POA: Insufficient documentation

## 2015-08-16 NOTE — Progress Notes (Signed)
Patient ID: Cathy Barnes, female   DOB: 1940-07-29, 75 y.o.   MRN: [id number]         [name] Female    DOB: 1941-03-19   75 y.o.   MRN: [id number] Visit Date: 08/16/2015  Today's Provider: Margarita Rana, MD   Chief Complaint  Patient presents with  . Foot Swelling   Subjective:    Foot Injury  The incident occurred more than 1 week ago. There was no injury mechanism. The pain is present in the right foot. The quality of the pain is described as aching (Throbbing). The pain has been constant since onset. She reports no foreign bodies present.   Has been seeing orthopedics.  They have drawn labs and they are abnormal and scheduled her to see a rheumatologist in mid May.   Has been wearing a boot intermittently, but has worsened recently with increased swelling. Can not bend her toes as well secondary to swelling.       Allergies  Allergen Reactions  . Shellfish Allergy     Throat closes, rash   Previous Medications   ALPRAZOLAM (XANAX) 0.25 MG TABLET    TAKE 2 TABLETS BY MOUTH AT BEDTIME.   ASPIRIN 81 MG TABLET    Take 81 mg by mouth daily.   ATENOLOL (TENORMIN) 50 MG TABLET    take 1 tablet by mouth once daily   BAYER CONTOUR TEST TEST STRIP       CYCLOBENZAPRINE (FLEXERIL) 10 MG TABLET    Take 1 tablet (10 mg total) by mouth at bedtime.   D3-50 50000 UNITS CAPSULE    take 1 capsule every month   DONEPEZIL (ARICEPT) 5 MG TABLET       FLUZONE HIGH-DOSE INJECTION    Inject 180 mLs into the muscle once.   GABAPENTIN (NEURONTIN) 100 MG CAPSULE    Take 2 capsules (200 mg total) by mouth 2 (two) times daily. Take 2 tablet in am and 2 tablet at lunch.   GABAPENTIN (NEURONTIN) 300 MG CAPSULE    Take 1 capsule (300 mg total) by mouth at bedtime.   HYDROCODONE-ACETAMINOPHEN 5-300 MG TABS    Take 1 tablet by mouth 2 (two) times daily as needed.   LEVOTHYROXINE (SYNTHROID, LEVOTHROID) 88 MCG TABLET    take 1 tablet by mouth once daily   LEVOTHYROXINE SODIUM 88 MCG CAPS     Take by mouth daily before breakfast.   LOSARTAN (COZAAR) 50 MG TABLET    take 1 tablet by mouth at bedtime   POTASSIUM CHLORIDE SA (K-DUR,KLOR-CON) 20 MEQ TABLET    take 1 tablet by mouth twice a day   SOLIFENACIN (VESICARE) 10 MG TABLET    Take 0.5 tablets (5 mg total) by mouth daily.   SUMATRIPTAN 6 MG/0.5ML SOAJ    Inject 6 mg into the skin once as needed.   TOPIRAMATE (TOPAMAX) 100 MG TABLET    Take 1 tablet (100 mg total) by mouth every evening.   TOPIRAMATE (TOPAMAX) 25 MG TABLET    Take 1 tablet (25 mg total) by mouth every morning.   TRIAMTERENE-HYDROCHLOROTHIAZIDE (MAXZIDE) 75-50 MG TABLET    TAKE 1/2 TABLET BY MOUTH ONCE DAILY   VERAPAMIL (VERELAN PM) 120 MG 24 HR CAPSULE    Take 1 capsule (120 mg total) by mouth once.   VITAMIN D, ERGOCALCIFEROL, (DRISDOL) 50000 UNITS CAPS CAPSULE    Take 50,000 Units by mouth every 30 (thirty) days.   ZETIA 10 MG TABLET  take 1 tablet by mouth at bedtime   ZOLPIDEM (AMBIEN) 10 MG TABLET    take 1 tablet by mouth at bedtime    Review of Systems  Constitutional: Negative.  Negative for chills, activity change and appetite change.  Cardiovascular: Positive for leg swelling (Right foot and ankle swelling. ).  Gastrointestinal: Positive for nausea. Negative for vomiting, abdominal pain, diarrhea, constipation, blood in stool, abdominal distention, anal bleeding and rectal pain.  Musculoskeletal: Positive for arthralgias. Negative for myalgias, back pain, joint swelling, gait problem, neck pain and neck stiffness.    Social History  Substance Use Topics  . Smoking status: Never Smoker   . Smokeless tobacco: Never Used  . Alcohol Use: 0.0 oz/week    0 Standard drinks or equivalent per week     Comment: Rare occation    Objective:   BP 148/68 mmHg  Pulse 76  Temp(Src) 97.8 F (36.6 C) (Oral)  Resp 16  Wt 183 lb (83.008 kg)  Physical Exam  Constitutional: She is oriented to person, place, and time. She appears well-developed and  well-nourished.  Musculoskeletal: She exhibits edema and tenderness.  Neurological: She is alert and oriented to person, place, and time.  Psychiatric: She has a normal mood and affect. Her behavior is normal. Judgment and thought content normal.      Assessment & Plan:     1. Right foot pain Unusual problem.  Does have positive ANA.  Will check other labs as noted.  - CBC with Differential/Platelet - Lyme Disease Abs IgG, IgM, IFA, CSF - Sed Rate (ESR) - Rheumatoid Factor - D-Dimer, Quantitative - VAS [place] LOWER EXTREMITY VENOUS (DVT); Future - US Venous Img Lower Unilateral Right  2. Monoarthritis, ankle and foot, right Labs as noted below.   - CBC with Differential/Platelet - Lyme Disease Abs IgG, IgM, IFA, CSF - Sed Rate (ESR) - Rheumatoid Factor - D-Dimer, Quantitative - VAS [place] LOWER EXTREMITY VENOUS (DVT); Future - US Venous Img Lower Unilateral Right  3. Swelling of right lower extremity Increased swelling noted.   Has decreased activity recently.  Will check doppler to rule out DVT.  Further plan pending these results.   - CBC with Differential/Platelet - Lyme Disease Abs IgG, IgM, IFA, CSF - Sed Rate (ESR) - Rheumatoid Factor - D-Dimer, Quantitative - VAS [place] LOWER EXTREMITY VENOUS (DVT); Future - US Venous Img Lower Unilateral Right    Patient was seen and examined by Jerrell Belfast, MD, and note scribed by Ashley Royalty, CMA. I have reviewed the document for accuracy and completeness and I agree with above. - Jerrell Belfast, MD    Margarita Rana, MD  [facility]

## 2015-08-17 LAB — CBC WITH DIFFERENTIAL/PLATELET
Basophils Absolute: 0 10*3/uL (ref 0.0–0.2)
Basos: 0 %
EOS (ABSOLUTE): 0.5 10*3/uL — AB (ref 0.0–0.4)
EOS: 4 %
HEMATOCRIT: 36.8 % (ref 34.0–46.6)
Hemoglobin: 12.4 g/dL (ref 11.1–15.9)
IMMATURE GRANULOCYTES: 0 %
Immature Grans (Abs): 0 10*3/uL (ref 0.0–0.1)
LYMPHS ABS: 6.7 10*3/uL — AB (ref 0.7–3.1)
Lymphs: 55 %
MCH: 29.5 pg (ref 26.6–33.0)
MCHC: 33.7 g/dL (ref 31.5–35.7)
MCV: 87 fL (ref 79–97)
MONOS ABS: 1 10*3/uL — AB (ref 0.1–0.9)
Monocytes: 8 %
NEUTROS PCT: 33 %
Neutrophils Absolute: 4.1 10*3/uL (ref 1.4–7.0)
PLATELETS: 259 10*3/uL (ref 150–379)
RBC: 4.21 x10E6/uL (ref 3.77–5.28)
RDW: 14.2 % (ref 12.3–15.4)
WBC: 12.4 10*3/uL — AB (ref 3.4–10.8)

## 2015-08-17 LAB — RHEUMATOID FACTOR

## 2015-08-17 LAB — SEDIMENTATION RATE: Sed Rate: 14 mm/hr (ref 0–40)

## 2015-08-17 LAB — D-DIMER, QUANTITATIVE (NOT AT ARMC): D-DIMER: 0.8 mg{FEU}/L — AB (ref 0.00–0.49)

## 2015-08-20 ENCOUNTER — Encounter: Payer: Self-pay | Admitting: Family Medicine

## 2015-08-21 ENCOUNTER — Other Ambulatory Visit: Payer: Self-pay | Admitting: Neurology

## 2015-08-22 ENCOUNTER — Telehealth: Payer: Self-pay | Admitting: Family Medicine

## 2015-08-22 DIAGNOSIS — L0390 Cellulitis, unspecified: Secondary | ICD-10-CM | POA: Insufficient documentation

## 2015-08-22 DIAGNOSIS — N3281 Overactive bladder: Secondary | ICD-10-CM

## 2015-08-22 DIAGNOSIS — L03818 Cellulitis of other sites: Secondary | ICD-10-CM

## 2015-08-22 MED ORDER — CEPHALEXIN 500 MG PO CAPS: 500.0000 mg | ORAL_CAPSULE | Freq: Four times a day (QID) | ORAL | Status: DC

## 2015-08-22 MED ORDER — SOLIFENACIN SUCCINATE 10 MG PO TABS: 10.0000 mg | ORAL_TABLET | Freq: Every day | ORAL | Status: DC

## 2015-08-22 NOTE — Telephone Encounter (Signed)
Pt would like a call back to get her lab results from 08/16/15. Pt stated that her right foot and ankle hasn't improved. Pt stated it is still red and swollen.  Pt contacted office for refill request on the following medications: solifenacin (VESICARE) 10 MG tablet. Pt stated that Tawanna Sat advised her to take a whole pill every other day and a half a pill the other days. Pt stated b/c the way the RX was written she can't get a refill until 08/28/15 but she doesn't have enough medication to last until the 10th. Pt would like to get samples or a new RX sent to [facility]. [address]. Please advise. Thanks TNP

## 2015-08-22 NOTE — Telephone Encounter (Signed)
Will treat with antibiotic.  Call if worsens or does not improve. Thanks.

## 2015-08-28 ENCOUNTER — Other Ambulatory Visit: Payer: Self-pay | Admitting: Family Medicine

## 2015-08-28 MED ORDER — CEPHALEXIN 500 MG PO CAPS: 500.0000 mg | ORAL_CAPSULE | Freq: Four times a day (QID) | ORAL | Status: DC

## 2015-08-28 NOTE — Telephone Encounter (Signed)
Sent in new rx.  Please notify patient. Thanks.   

## 2015-08-28 NOTE — Telephone Encounter (Signed)
Pt contacted office for refill request on the following medications: cephALEXin (KEFLEX) 500 MG capsule to [facility] on [address]. Last written: 08/22/15 Last OV: 08/16/15  Pt stated that she has been taking the medication as directed and she will finish it tomorrow 08/29/15. Pt stated that yesterday 08/27/15 she finally started to see and feel an improvement on her foot. Pt stated that her pain level went from a 10 to an 8. The bluish red color in her foot has improved some and the swelling went down enough where she could get a regular shoe on her foot. Pt was wanting to see if she can get another round of the cephALEXin (KEFLEX) 500 MG capsule since she thinks it is finally starting to help. Please advise. Thanks TNP

## 2015-08-29 NOTE — Telephone Encounter (Signed)
Pt advised.   Thanks,   -Laura  

## 2015-08-31 ENCOUNTER — Other Ambulatory Visit: Payer: Self-pay | Admitting: Neurology

## 2015-09-13 ENCOUNTER — Other Ambulatory Visit: Payer: Self-pay | Admitting: Physician Assistant

## 2015-09-17 ENCOUNTER — Other Ambulatory Visit: Payer: Self-pay | Admitting: Physician Assistant

## 2015-09-17 DIAGNOSIS — M79671 Pain in right foot: Secondary | ICD-10-CM

## 2015-09-18 ENCOUNTER — Encounter: Payer: Self-pay | Admitting: Neurology

## 2015-09-22 ENCOUNTER — Other Ambulatory Visit: Payer: Self-pay | Admitting: Family Medicine

## 2015-09-22 DIAGNOSIS — E039 Hypothyroidism, unspecified: Secondary | ICD-10-CM

## 2015-09-23 NOTE — Telephone Encounter (Signed)
Last TSH checked on 06/25/2014

## 2015-09-25 ENCOUNTER — Other Ambulatory Visit: Payer: Self-pay | Admitting: Family Medicine

## 2015-09-25 DIAGNOSIS — M9979 Connective tissue and disc stenosis of intervertebral foramina of abdomen and other regions: Secondary | ICD-10-CM

## 2015-09-25 NOTE — Telephone Encounter (Signed)
Pt needs refill Hydrocodone-Acetaminophen 5-300 MG TABS  Please call when ready for pick up  [phone]  Thanks teri

## 2015-09-26 ENCOUNTER — Ambulatory Visit
Admission: RE | Admit: 2015-09-26 | Discharge: 2015-09-26 | Disposition: A | Discharge status: Home / Self Care | Payer: Medicare Other | Source: Ambulatory Visit | Attending: Physician Assistant | Admitting: Physician Assistant

## 2015-09-26 DIAGNOSIS — M79671 Pain in right foot: Secondary | ICD-10-CM

## 2015-09-26 MED ORDER — HYDROCODONE-ACETAMINOPHEN 5-300 MG PO TABS: 1.0000 | ORAL_TABLET | Freq: Two times a day (BID) | ORAL | Status: DC | PRN

## 2015-09-26 MED ORDER — GADOBENATE DIMEGLUMINE 529 MG/ML IV SOLN
8.0000 mL | Freq: Once | INTRAVENOUS | Status: AC | PRN
  Administered 2015-09-26: 8 mL via INTRAVENOUS

## 2015-09-26 NOTE — Telephone Encounter (Signed)
Prescription printed. Please notify patient it is ready for pick up. Thanks- Dr. Jacqualin Shirkey.  

## 2015-10-03 ENCOUNTER — Encounter: Payer: Self-pay | Admitting: Physician Assistant

## 2015-10-03 ENCOUNTER — Ambulatory Visit (INDEPENDENT_AMBULATORY_CARE_PROVIDER_SITE_OTHER): Payer: Medicare Other | Admitting: Physician Assistant

## 2015-10-03 VITALS — BP 150/62 | HR 70 | Temp 97.9°F | Resp 16 | Wt 179.0 lb

## 2015-10-03 DIAGNOSIS — M84371A Stress fracture, right ankle, initial encounter for fracture: Secondary | ICD-10-CM

## 2015-10-03 DIAGNOSIS — M722 Plantar fascial fibromatosis: Secondary | ICD-10-CM

## 2015-10-03 DIAGNOSIS — M76821 Posterior tibial tendinitis, right leg: Secondary | ICD-10-CM

## 2015-10-03 DIAGNOSIS — M7671 Peroneal tendinitis, right leg: Secondary | ICD-10-CM

## 2015-10-03 DIAGNOSIS — N3281 Overactive bladder: Secondary | ICD-10-CM

## 2015-10-03 DIAGNOSIS — G4700 Insomnia, unspecified: Secondary | ICD-10-CM | POA: Diagnosis not present

## 2015-10-03 DIAGNOSIS — M7751 Other enthesopathy of right foot: Secondary | ICD-10-CM | POA: Diagnosis not present

## 2015-10-03 MED ORDER — ZOLPIDEM TARTRATE 10 MG PO TABS: 10.0000 mg | ORAL_TABLET | Freq: Every day | ORAL | Status: DC

## 2015-10-03 NOTE — Patient Instructions (Addendum)
Plantar Fasciitis Plantar fasciitis is a painful foot condition that affects the heel. It occurs when the band of tissue that connects the toes to the heel bone (plantar fascia) becomes irritated. This can happen after exercising too much or doing other repetitive activities (overuse injury). The pain from plantar fasciitis can range from mild irritation to severe pain that makes it difficult for you to walk or move. The pain is usually worse in the morning or after you have been sitting or lying down for a while. CAUSES This condition may be caused by:  Standing for long periods of time.  Wearing shoes that do not fit.  Doing high-impact activities, including running, aerobics, and ballet.  Being overweight.  Having an abnormal way of walking (gait).  Having tight calf muscles.  Having high arches in your feet.  Starting a new athletic activity. SYMPTOMS The main symptom of this condition is heel pain. Other symptoms include:  Pain that gets worse after activity or exercise.  Pain that is worse in the morning or after resting.  Pain that goes away after you walk for a few minutes. DIAGNOSIS This condition may be diagnosed based on your signs and symptoms. Your health care provider will also do a physical exam to check for:  A tender area on the bottom of your foot.  A high arch in your foot.  Pain when you move your foot.  Difficulty moving your foot. You may also need to have imaging studies to confirm the diagnosis. These can include:  X-rays.  Ultrasound.  MRI. TREATMENT  Treatment for plantar fasciitis depends on the severity of the condition. Your treatment may include:  Rest, ice, and over-the-counter pain medicines to manage your pain.  Exercises to stretch your calves and your plantar fascia.  A splint that holds your foot in a stretched, upward position while you sleep (night splint).  Physical therapy to relieve symptoms and prevent problems in the  future.  Cortisone injections to relieve severe pain.  Extracorporeal shock wave therapy (ESWT) to stimulate damaged plantar fascia with electrical impulses. It is often used as a last resort before surgery.  Surgery, if other treatments have not worked after 12 months. HOME CARE INSTRUCTIONS  Take medicines only as directed by your health care provider.  Avoid activities that cause pain.  Roll the bottom of your foot over a bag of ice or a bottle of cold water. Do this for 20 minutes, 3-4 times a day.  Perform simple stretches as directed by your health care provider.  Try wearing athletic shoes with air-sole or gel-sole cushions or soft shoe inserts.  Wear a night splint while sleeping, if directed by your health care provider.  Keep all follow-up appointments with your health care provider. PREVENTION   Do not perform exercises or activities that cause heel pain.  Consider finding low-impact activities if you continue to have problems.  Lose weight if you need to. The best way to prevent plantar fasciitis is to avoid the activities that aggravate your plantar fascia. SEEK MEDICAL CARE IF:  Your symptoms do not go away after treatment with home care measures.  Your pain gets worse.  Your pain affects your ability to move or do your daily activities.   This information is not intended to replace advice given to you by your health care provider. Make sure you discuss any questions you have with your health care provider.   Document Released: 12/30/2000 Document Revised: 12/26/2014 Document Reviewed: 02/14/2014 Elsevier   Interactive Patient Education [employer]. Stress Fracture Stress fracture is a small break or crack in a bone. A stress fracture can be fully broken (complete) or partially broken (incomplete). The most common sites for stress fractures are the bones in the front of your feet (metatarsals), your heels (calcaneus), and the long bone of your lower leg  (tibia). CAUSES A stress fracture is caused by overuse or repetitive exercise, such as running. It happens when a bone cannot absorb any more shock because the muscles around it are weak. Stress fractures happen most commonly when:  You rapidly increase or start a new physical activity.  You use shoes that are worn out or do not fit you properly.  You exercise on a new surface. RISK FACTORS You may be at higher risk for this type of fracture if:  You have a condition that causes weak bones (osteoporosis).  You are female. Stress fractures are more likely to occur in women. SIGNS AND SYMPTOMS The most common symptom of a stress fracture is feeling pain when you are using the affected part of your body. The pain usually goes away when you are resting. Other symptoms may include:  Swelling of the affected area.  Pain in the area when it is touched.  Decreased pain while resting. Stress fracture pain usually develops over time. DIAGNOSIS Diagnosis may include:   Medical history and physical exam.  X-rays.  Bone scan.  MRI. TREATMENT Treatment depends on the severity of your stress fracture. Treatment usually involves resting, icing, compression, and elevation (RICE) of the affected part of your body. Treatment may also include:  Medicines to reduce inflammation.  A cast or a walking shoe.  Crutches.  Surgery. HOME CARE INSTRUCTIONS If You Have a Cast:  Do not stick anything inside the cast to scratch your skin. Doing that increases your risk of infection.  Check the skin around the cast every day. Report any concerns to your health care provider. You may put lotion on dry skin around the edges of the cast. Do not apply lotion to the skin underneath the cast.  Keep the cast clean and dry.  Cover the cast with a watertight plastic bag to protect it from water while you take a bath or a shower. Do not let the cast get wet.  Do not put pressure on any part of the cast  until it is fully hardened. This may take several hours. If You Have a Walking Shoe:  Wear it as directed by your health care provider. Managing Pain, Stiffness, and Swelling  If directed, apply ice to the injured area:  Put ice in a plastic bag.  Place a towel between your skin and the bag.  Leave the ice on for 20 minutes, 2-3 times per day.  Move your fingers or toes often to avoid stiffness and to lessen swelling.  Raise the injured area above the level of your heart while you are sitting or lying down. Activity  Rest as directed by your health care provider. Ask your health care provider if you may do alternative exercises, such as swimming or biking, while you are healing.  Return to your normal activities as directed by your health care provider. Ask your health care provider what activities are safe for you.  Perform range-of-motion exercises only as directed by your health care provider. Safety  Do not use the injured limb to support yourbody weight until your health care provider says that you can. Use crutches if  your health care provider tells you to do so. General Instructions  Do not use any tobacco products, including cigarettes, chewing tobacco, or electronic cigarettes. Tobacco can delay bone healing. If you need help quitting, ask your health care provider.  Take medicines only as directed by your health care provider.  Keep all follow-up visits as directed by your health care provider. This is important. PREVENTION  Only wear shoes that:  Fit well.  Are not worn out.  Eat a healthy diet that contains vitamin D and calcium. This helps keeps your bones strong.  Be careful when you start a new physical activity. Give your body time to adjust.  Avoid doing only one kind of activity. Do different exercises, such as swimming and running, so that no single part of your body gets overused.  Do strength-training exercises. SEEK MEDICAL CARE IF:  Your pain  gets worse.  You have new symptoms.  You have increased swelling. SEEK IMMEDIATE MEDICAL CARE IF:   You lose feeling in the affected area.   This information is not intended to replace advice given to you by your health care provider. Make sure you discuss any questions you have with your health care provider.   Document Released: 06/27/2002 Document Revised: 04/27/2014 Document Reviewed: 11/09/2013 Elsevier Interactive Patient Education [employer].

## 2015-10-03 NOTE — Progress Notes (Signed)
[name] Female    DOB: 22-May-1940   75 y.o.   MRN: [id number] Visit Date: 10/03/2015  Today's Provider: Mar Daring, PA-C   Chief Complaint  Patient presents with  . Follow-up    overactive bladder   Subjective:    HPI Overactive bladder Patient comes in today to F/U on overactive bladder. She is on Vesicare 10 MG. She reports ii is much better. She is not getting up in the middle of the night like she was, actually over the past few months she has not gotten up at all.   She does have some other issues she would like to discuss including following up on her right foot pain. She underwent multiple labs and workup for rheumatoid or auto-immune causes. The rheumatologist, Dr. Annamaria Boots, told her that she did not have RA but did order an MRI of her foot for further evaluation. MRI was done last Thursday. She has not heard the results of this and is curious if we can see them to give them to her.  She also has a new bruise in the popliteal fossa of same leg that came up following MRI she would like evaluated. No pain at site of bruise, she just noticed the discoloration.     Allergies  Allergen Reactions  . Shellfish Allergy     Throat closes, rash   Current Meds  Medication Sig  . ALPRAZolam (XANAX) 0.25 MG tablet TAKE 2 TABLETS BY MOUTH AT BEDTIME.  [name] aspirin 81 MG tablet Take 81 mg by mouth daily.  [name] atenolol (TENORMIN) 50 MG tablet take 1 tablet by mouth once daily  . BAYER CONTOUR TEST test strip   . cyclobenzaprine (FLEXERIL) 10 MG tablet take 1 tablet by mouth at bedtime  . D3-50 50000 units capsule take 1 capsule every month  . donepezil (ARICEPT) 5 MG tablet   . gabapentin (NEURONTIN) 100 MG capsule Take 2 capsules (200 mg total) by mouth 2 (two) times daily. Take 2 tablet in am and 2 tablet at lunch.  . gabapentin (NEURONTIN) 300 MG capsule take 1 capsule by mouth at bedtime  . Hydrocodone-Acetaminophen 5-300 MG TABS Take 1 tablet by mouth 2  (two) times daily as needed.  [name] levothyroxine (SYNTHROID, LEVOTHROID) 88 MCG tablet take 1 tablet by mouth once daily  . losartan (COZAAR) 50 MG tablet take 1 tablet by mouth at bedtime  . potassium chloride SA (K-DUR,KLOR-CON) 20 MEQ tablet take 1 tablet by mouth twice a day  . solifenacin (VESICARE) 10 MG tablet Take 1 tablet (10 mg total) by mouth daily.  . SUMAtriptan 6 MG/0.5ML SOAJ Inject 6 mg into the skin once as needed.  . topiramate (TOPAMAX) 100 MG tablet Take 1 tablet (100 mg total) by mouth every evening.  . topiramate (TOPAMAX) 25 MG tablet Take 1 tablet (25 mg total) by mouth every morning.  . triamterene-hydrochlorothiazide (MAXZIDE) 75-50 MG tablet TAKE 1/2 TABLET BY MOUTH ONCE DAILY  . verapamil (VERELAN PM) 120 MG 24 hr capsule Take 1 capsule (120 mg total) by mouth once.  [name] ZETIA 10 MG tablet take 1 tablet by mouth at bedtime  . zolpidem (AMBIEN) 10 MG tablet take 1 tablet by mouth at bedtime    Review of Systems  Constitutional: Negative.   Cardiovascular: Negative for chest pain, palpitations and leg swelling.  Gastrointestinal: Negative.   Genitourinary: Negative.   Musculoskeletal: Positive for joint swelling (occasional right foot) and arthralgias (right  foot).    Social History  Substance Use Topics  . Smoking status: Never Smoker   . Smokeless tobacco: Never Used  . Alcohol Use: 0.0 oz/week    0 Standard drinks or equivalent per week     Comment: Rare occation    Objective:   BP 150/62 mmHg  Pulse 70  Temp(Src) 97.9 F (36.6 C) (Oral)  Resp 16  Wt 179 lb (81.194 kg)  Physical Exam  Constitutional: She appears well-developed and well-nourished. No distress.  Cardiovascular: Normal rate, regular rhythm and normal heart sounds.  Exam reveals no gallop and no friction rub.   No murmur heard. Pulmonary/Chest: Effort normal and breath sounds normal. No respiratory distress. She has no wheezes. She has no rales.  Musculoskeletal: Normal range of motion.    Bruise noted on right popliteal area most likely from positioning during MRI.  Skin: She is not diaphoretic.  Vitals reviewed.     Assessment & Plan:     1. Stress fracture of ankle, right, initial encounter Discussed results of MRI with patient today in the office. Also involved Dr. Venia Minks. We both agree that we will refer her to podiatry instead of back to orthopedics for further evaluation and treatment. I will refer her to Dr. Barkley Bruns at [facility] as I have had a good response from patients for him when it comes to stress fractures. I did advise her to request the MRI images just in case Dr. Barkley Bruns is unable to view our records, however I think he should be able to see them. She agrees and is encouraged that we have an answer to her foot pain. This is the 2nd stress fracture in this foot. Last stress fracture was last winter 2016. She was in a cam walker for approx 4 months. She did have a DXA scan which was normal. Unknown cause of repeat stress fractures.  - Ambulatory referral to Podiatry  2. Peroneus brevis tendinitis, right Seen on MRI. See above medical treatment plan.  3. Tibialis posterior tendonitis, right Seen on MRI. See above medical treatment plan.  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 See above medical treatment plan.  5. Insomnia Stable. Diagnosis pulled for medication refill. Continue current medical treatment plan. - zolpidem (AMBIEN) 10 MG tablet; Take 1 tablet (10 mg total) by mouth at bedtime.  Dispense: 90 tablet; Refill: 1  6. Overactive bladder Stable. Doing well on vesicare 10mg . Recently refilled by Dr. Venia Minks. Continue current medical treatment plan.  I spent approximately 30 minutes with the patient today. Over 50% of this time was spent with counseling and educating the patient.       Mar Daring, PA-C  [facility]

## 2015-10-15 ENCOUNTER — Other Ambulatory Visit: Payer: Self-pay | Admitting: Family Medicine

## 2015-10-26 ENCOUNTER — Other Ambulatory Visit: Payer: Self-pay | Admitting: Neurology

## 2015-10-28 NOTE — Telephone Encounter (Signed)
Dr Jaynee Eagles- are you okay with refilling flexeril? I did not see mention of it in last OV.

## 2015-11-04 ENCOUNTER — Ambulatory Visit: Payer: Medicare Other | Admitting: Neurology

## 2015-11-14 ENCOUNTER — Encounter: Payer: Self-pay | Admitting: Neurology

## 2015-11-14 ENCOUNTER — Ambulatory Visit (INDEPENDENT_AMBULATORY_CARE_PROVIDER_SITE_OTHER): Payer: Medicare Other | Admitting: Neurology

## 2015-11-14 VITALS — BP 143/70 | HR 62 | Ht 63.0 in | Wt 179.8 lb

## 2015-11-14 DIAGNOSIS — G43009 Migraine without aura, not intractable, without status migrainosus: Secondary | ICD-10-CM

## 2015-11-14 DIAGNOSIS — G3184 Mild cognitive impairment, so stated: Secondary | ICD-10-CM

## 2015-11-14 MED ORDER — TOPIRAMATE 100 MG PO TABS: 50.0000 mg | ORAL_TABLET | Freq: Every evening | ORAL | 3 refills | Status: DC

## 2015-11-14 NOTE — Patient Instructions (Signed)
Overall you are doing fairly well but I do want to suggest a few things today:   Remember to drink plenty of fluid, eat healthy meals and do not skip any meals. Try to eat protein with a every meal and eat a healthy snack such as fruit or nuts in between meals. Try to keep a regular sleep-wake schedule and try to exercise daily, particularly in the form of walking, 20-30 minutes a day, if you can.   As far as your medications are concerned, I would like to suggest: Decrease Topamax to 50mg  each night (1/2 tab)  I would like to see you back in 6 months, sooner if we need to. Please call us with any interim questions, concerns, problems, updates or refill requests.   Our phone number is [phone]. We also have an after hours call service for urgent matters and there is a physician on-call for urgent questions. For any emergencies you know to call 911 or go to the nearest emergency room

## 2015-11-14 NOTE — Progress Notes (Signed)
[id number]    Provider:  Dr Cathy Barnes Referring Provider: Margarita Rana, MD Primary Care Physician:  Cathy Daring, PA-C  CC: Memory loss  Interval history 11/14/2015: Patient comes back today. She is feeling much better as far as memory loss go since decreasing the Topamax from 200-100. She sees no changes in her migraines 100 mg. We'll further decrease Topamax to 50 mg. 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 Montral cognitive assessment score was 23 but Mini-Mental status exam was 30 out of 30. We have clinical trial here, CREAD, discussed at length with patient and he should be a wonderful candidate for this clinical trial which includes imaging and no cost patient including PET amyloid scans which would be very interesting especially in this patient's case. I discussed this at length, in the research team also came in and discuss with her provided brochures. We will continue to follow her every 6 months.  Interval history 05/06/2015: She is having eye twitching with the Aricept, she had diarrhea with 10mg  and she went back to 5mg . Reviewed labs and MRI images with patient and compared them to 2010 MRI which is mostly stable. Showed chronic microvascular ischemic changes, discussed vascular risk factors and treatment.   She has migraines, every other day last 4-6 hours, unilateral, either side, throbbing and pulsatin with light sensitivity, sounds sensitivity, nausea no vomiting. No aura. No medication obveruse. On Topamax. On Atenolol. Failed Depakote, Pamelor and Elavil, she has failed imitrex oral and takes shots which may or may not help. She can't work, they can be severe 10/10 on average 5-6/10. At least 16 or more migraines a month. For the last 5-10 years or longer.   HPI: VERTIA Barnes is a 75 y.o. female here as a referral from Dr. Venia Barnes for memory loss. PMHx of migraine, HTN, essential tremor. She is a patient of Dr. [name] and Ward Givens and  is seeing me today. Symptoms started within the last 2 months. She is having problems with numbers. She is having some short-term memory changes but most significantly with numbers. 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 She is scared, something is not right. Started acutely and getting worse. She took some prednisone for her foot and then this problem started. That may be a coincidence. She has stopped the steroids. No new medication changes, nothing new. No head trauma or inciting events. No other short-term memory changes except sometimes she forgets little things when she is very tired. Her TSH is monitored by pcp for hypothyroidism. No snoring at night, not excessively tired during the day, she had a sleep test in the past. No alteration of consciousness. No other focal neurologic symptoms.   Review of Systems: Patient complains of symptoms per HPI as well as the following symptoms: ringing in ears, cramps, memory los, headache, dizziness, tremor. Pertinent negatives per HPI. All others negative.  Social History   Social History  . Marital status: Divorced    Spouse name: N/A  . Number of children: 1  . Years of education: college   Occupational History  . book keeping Other    [employer]   Social History Main Topics  . Smoking status: Never Smoker  . Smokeless tobacco: Never Used  . Alcohol use 0.0 oz/week     Comment: Rare occation   .  Drug use: No  . Sexual activity: Not on file   Other Topics Concern  . Not on file   Social History Narrative   Patient is right handed, resides alone. Divorced.    Family History  Problem Relation Age of Onset  . Colon cancer Mother   . Hypertension Mother   . Colon cancer Father   . Heart disease Father   . Hypertension Father     Past Medical History:  Diagnosis Date  . Hyperglycemia   .  Hyperlipidemia   . Hypertension   . Migraine, unspecified, without mention of intractable migraine without mention of status migrainosus 12/14/2012  . Osteoarthritis, chronic     Past Surgical History:  Procedure Laterality Date  . BREAST CYST ASPIRATION Left 1993   Removed  . History of Cataract surgery Right 2011  . PARTIAL HYSTERECTOMY    . S/P ACL repair Left    knee  . TUBAL LIGATION  1975   Bilateral    Current Outpatient Prescriptions  Medication Sig Dispense Refill  . ALPRAZolam (XANAX) 0.25 MG tablet TAKE 2 TABLETS BY MOUTH AT BEDTIME. 60 tablet 5  . aspirin 81 MG tablet Take 81 mg by mouth daily.    [name] atenolol (TENORMIN) 50 MG tablet take 1 tablet by mouth once daily 90 tablet 1  . BAYER CONTOUR TEST test strip     . cyclobenzaprine (FLEXERIL) 10 MG tablet take 1 tablet by mouth at bedtime 30 tablet 1  . D3-50 50000 units capsule take 1 capsule every month 3 capsule 1  . donepezil (ARICEPT) 5 MG tablet   1  . gabapentin (NEURONTIN) 100 MG capsule Take 2 capsules (200 mg total) by mouth 2 (two) times daily. Take 2 tablet in am and 2 tablet at lunch. 360 capsule 1  . gabapentin (NEURONTIN) 300 MG capsule take 1 capsule by mouth at bedtime 90 capsule 0  . Hydrocodone-Acetaminophen 5-300 MG TABS Take 1 tablet by mouth 2 (two) times daily as needed. 60 each 0  . levothyroxine (SYNTHROID, LEVOTHROID) 88 MCG tablet take 1 tablet by mouth once daily 90 tablet 0  . losartan (COZAAR) 50 MG tablet take 1 tablet by mouth at bedtime 90 tablet 1  . potassium chloride SA (K-DUR,KLOR-CON) 20 MEQ tablet take 1 tablet by mouth twice a day 180 tablet 1  . solifenacin (VESICARE) 10 MG tablet Take 1 tablet (10 mg total) by mouth daily. 30 tablet 3  . topiramate (TOPAMAX) 100 MG tablet Take 1 tablet (100 mg total) by mouth every evening. 180 tablet 3  . topiramate (TOPAMAX) 25 MG tablet Take 1 tablet (25 mg total) by mouth every morning. 90 tablet 3  . triamterene-hydrochlorothiazide (MAXZIDE)  75-50 MG tablet TAKE 1/2 TABLET BY MOUTH ONCE DAILY 90 tablet 1  . verapamil (VERELAN PM) 120 MG 24 hr capsule Take 1 capsule (120 mg total) by mouth once. 90 capsule 1  . ZETIA 10 MG tablet take 1 tablet by mouth at bedtime 30 tablet 1  . zolpidem (AMBIEN) 10 MG tablet Take 1 tablet (10 mg total) by mouth at bedtime. 90 tablet 1   No current facility-administered medications for this visit.     Allergies as of 11/14/2015 - Review Complete 11/14/2015  Allergen Reaction Noted  . Shellfish allergy  12/17/2014    Vitals: BP (!) 143/70 (BP Location: Right Arm, Patient Position: Sitting, Cuff Size: Normal)   Pulse 62   Ht 5\' 3"  (1.6 m)   Wt 179  lb 12.8 oz (81.6 kg)   BMI 31.85 kg/m  Last Weight:  Wt Readings from Last 1 Encounters:  11/14/15 179 lb 12.8 oz (81.6 kg)   Last Height:   Ht Readings from Last 1 Encounters:  11/14/15 5\' 3"  (1.6 m)   MMSE - Mini Mental State Exam 11/14/2015  Orientation to time 5  Orientation to Place 5  Registration 3  Attention/ Calculation 5  Recall 3  Language- name 2 objects 2  Language- repeat 1  Language- follow 3 step command 3  Language- read & follow direction 1  Write a sentence 1  Copy design 1  Total score 30   Cranial Nerves:  The pupils are equal, round, and reactive to light. Visual fields are full.. Extraocular movements are intact. The face is symmetric. Hearing intact. Voice is normal.  Gait:  Not ataxic  Motor Observation:  No asymmetry, no atrophy, and no involuntary movements noted. Tone:  Normal muscle tone.   Posture:  Posture is normal. normal erect   Assessment/Plan: 75 y.o. female here as a referral from Dr. Venia Barnes for memory loss. MMSE 30/30, MoCA 23/30, possibly mild cognitive impairment. Will order an MRI of the brain, B12, start Aricept. Could also very well be medication related as she is on a lot of medications that can cause cognitive problems such as topamax, flexeril, neurontin,  alprazolam. Will decrease Topamax to 100mg  qhs and now further to 50mg  qhs. Discussed medications with patient.  MRi of the brain unremarkable, chronic microvascular ischemic white matter changes. Discussed vascular risk factors and trestment (BP control, diet and exercise, statins, watching glucose) B12, tsh unremarkable  MCI: continue Aricept, may be a great candidate for CREAD clinical trial. Discussed at length with her and had our research team step in and provide written information/brochures on the trial.    Cathy Ill, MD  [facility] [address] [place], [place] [address]  Phone [phone] Fax [phone]  A total of 30 minutes was spent face-to-face with this patient. Over half this time was spent on counseling patient on the Mild cognitive impairment, chronic microvascular ischemic changes on MRI and migraine diagnosis and different diagnostic and therapeutic options available.

## 2015-11-17 ENCOUNTER — Other Ambulatory Visit: Payer: Self-pay | Admitting: Neurology

## 2015-11-22 ENCOUNTER — Other Ambulatory Visit: Payer: Self-pay | Admitting: Family Medicine

## 2015-11-22 DIAGNOSIS — E559 Vitamin D deficiency, unspecified: Secondary | ICD-10-CM

## 2015-11-27 ENCOUNTER — Other Ambulatory Visit: Payer: Self-pay | Admitting: Adult Health

## 2015-12-04 ENCOUNTER — Other Ambulatory Visit: Payer: Self-pay | Admitting: Family Medicine

## 2015-12-04 DIAGNOSIS — I10 Essential (primary) hypertension: Secondary | ICD-10-CM

## 2015-12-04 NOTE — Telephone Encounter (Signed)
Saw you 10/03/2015. Renaldo Fiddler, CMA

## 2015-12-07 ENCOUNTER — Other Ambulatory Visit: Payer: Self-pay | Admitting: Family Medicine

## 2015-12-09 ENCOUNTER — Other Ambulatory Visit: Payer: Self-pay | Admitting: Neurology

## 2015-12-09 NOTE — Telephone Encounter (Signed)
Has seen you 10/03/2015. Last lipids checked 02/14/2014, and were WNL. Does not have FU scheduled. Renaldo Fiddler, CMA

## 2015-12-11 ENCOUNTER — Other Ambulatory Visit: Payer: Self-pay | Admitting: Physician Assistant

## 2015-12-11 DIAGNOSIS — G4700 Insomnia, unspecified: Secondary | ICD-10-CM

## 2015-12-11 MED ORDER — ZOLPIDEM TARTRATE 10 MG PO TABS: 10.0000 mg | ORAL_TABLET | Freq: Every day | ORAL | 1 refills | Status: DC

## 2015-12-11 NOTE — Telephone Encounter (Signed)
Pt contacted office for refill request on the following medications:  zolpidem (AMBIEN) 10 MG tablet.  [place]. [phone]

## 2015-12-11 NOTE — Telephone Encounter (Signed)
Pt saw you in June. Renaldo Fiddler, CMA

## 2015-12-14 ENCOUNTER — Other Ambulatory Visit: Payer: Self-pay | Admitting: Family Medicine

## 2015-12-14 DIAGNOSIS — N3281 Overactive bladder: Secondary | ICD-10-CM

## 2015-12-16 NOTE — Telephone Encounter (Signed)
Jenni's pt.   Thanks,   -Laura  

## 2015-12-21 ENCOUNTER — Other Ambulatory Visit: Payer: Self-pay | Admitting: Neurology

## 2015-12-26 ENCOUNTER — Ambulatory Visit (INDEPENDENT_AMBULATORY_CARE_PROVIDER_SITE_OTHER): Payer: Medicare Other | Admitting: Physician Assistant

## 2015-12-26 ENCOUNTER — Encounter: Payer: Self-pay | Admitting: Physician Assistant

## 2015-12-26 VITALS — BP 128/60 | HR 60 | Temp 97.7°F | Resp 16 | Wt 181.0 lb

## 2015-12-26 DIAGNOSIS — M25512 Pain in left shoulder: Secondary | ICD-10-CM

## 2015-12-26 DIAGNOSIS — M62838 Other muscle spasm: Secondary | ICD-10-CM

## 2015-12-26 NOTE — Progress Notes (Signed)
[name] Female    DOB: 07-28-1940   75 y.o.   MRN: [id number] Visit Date: 12/26/2015  Today's Provider: Mar Daring, PA-C   Chief Complaint  Patient presents with  . Neck Pain   Subjective:    HPI Neck Pain: Paitent complains of neck pain. Event that precipitate these symptoms: none known. Onset of symptoms 3 weeks ago, unchanged since that time. Current symptoms are stiffness in left side of neck. Patient denies weakness in neck. Patient has had no prior neck problems.  Previous treatments include: none. She denies and arm numbness/tingling or weakness. She reports she feels a tightness in the left side of the neck. On bad days it will radiate up the left side of the neck and cause her to develop a left-sided headache that radiates from the neck/occipital area, behind the ear and up to left frontal area behind left eye. She has tried heat for the area with only minimal relief (helps when it is on, but returns as soon as she removes).      Allergies  Allergen Reactions  . Shellfish Allergy     Throat closes, rash     Current Outpatient Prescriptions:  .  ALPRAZolam (XANAX) 0.25 MG tablet, TAKE 2 TABLETS BY MOUTH AT BEDTIME., Disp: 60 tablet, Rfl: 5 .  aspirin 81 MG tablet, Take 81 mg by mouth daily., Disp: , Rfl:  .  atenolol (TENORMIN) 50 MG tablet, take 1 tablet by mouth once daily, Disp: 90 tablet, Rfl: 1 .  BAYER CONTOUR TEST test strip, , Disp: , Rfl:  .  Cholecalciferol (D3-50) 50000 units capsule, take 1 capsule by mouth EVERY MONTH, Disp: 3 capsule, Rfl: 1 .  [START ON 12/27/2015] cyclobenzaprine (FLEXERIL) 10 MG tablet, take 1 tablet by mouth at bedtime, Disp: 30 tablet, Rfl: 5 .  donepezil (ARICEPT) 5 MG tablet, , Disp: , Rfl: 1 .  gabapentin (NEURONTIN) 100 MG capsule, take 2 capsules by mouth every morning and 2 tablets AT LUNCH, Disp: 360 capsule, Rfl: 1 .  gabapentin (NEURONTIN) 300 MG capsule, take 1 capsule by mouth at bedtime, Disp: 90  capsule, Rfl: 0 .  Hydrocodone-Acetaminophen 5-300 MG TABS, Take 1 tablet by mouth 2 (two) times daily as needed., Disp: 60 each, Rfl: 0 .  levothyroxine (SYNTHROID, LEVOTHROID) 88 MCG tablet, take 1 tablet by mouth once daily, Disp: 90 tablet, Rfl: 0 .  losartan (COZAAR) 50 MG tablet, take 1 tablet by mouth at bedtime, Disp: 90 tablet, Rfl: 1 .  potassium chloride SA (K-DUR,KLOR-CON) 20 MEQ tablet, take 1 tablet by mouth twice a day, Disp: 180 tablet, Rfl: 1 .  topiramate (TOPAMAX) 100 MG tablet, Take 0.5 tablets (50 mg total) by mouth every evening., Disp: 45 tablet, Rfl: 3 .  triamterene-hydrochlorothiazide (MAXZIDE) 75-50 MG tablet, TAKE 1/2 TABLET BY MOUTH ONCE DAILY, Disp: 90 tablet, Rfl: 1 .  verapamil (VERELAN PM) 120 MG 24 hr capsule, Take 1 capsule (120 mg total) by mouth once., Disp: 90 capsule, Rfl: 1 .  VESICARE 10 MG tablet, take 1 tablet by mouth once daily, Disp: 90 tablet, Rfl: 4 .  ZETIA 10 MG tablet, take 1 tablet by mouth at bedtime, Disp: 30 tablet, Rfl: 1 .  zolpidem (AMBIEN) 10 MG tablet, Take 1 tablet (10 mg total) by mouth at bedtime., Disp: 90 tablet, Rfl: 1  Review of Systems  Constitutional: Negative.   Respiratory: Negative.   Cardiovascular: Negative.   Musculoskeletal: Positive  for arthralgias, neck pain and neck stiffness.  Neurological: Positive for headaches. Negative for weakness and numbness.    Social History  Substance Use Topics  . Smoking status: Never Smoker  . Smokeless tobacco: Never Used  . Alcohol use 0.0 oz/week     Comment: Rare occation    Objective:   BP 128/60 (BP Location: Left Arm, Patient Position: Sitting, Cuff Size: Large)   Pulse 60   Temp 97.7 F (36.5 C) (Oral)   Resp 16   Wt 181 lb (82.1 kg)   SpO2 96%   BMI 32.06 kg/m   Physical Exam  Constitutional: She appears well-developed and well-nourished. No distress.  Neck: Normal range of motion. Neck supple. No JVD present. Muscular tenderness present. No spinous process  tenderness present. No tracheal deviation and normal range of motion present. No thyromegaly present.    Cardiovascular: Normal rate, regular rhythm and normal heart sounds.  Exam reveals no gallop and no friction rub.   No murmur heard. Pulmonary/Chest: Effort normal and breath sounds normal. No respiratory distress. She has no wheezes. She has no rales.  Musculoskeletal:       Right shoulder: Normal.       Left shoulder: Normal.  Strength 5/5 No decrease in grip strength bilaterally.  Lymphadenopathy:    She has no cervical adenopathy.  Skin: She is not diaphoretic.  Vitals reviewed.     Assessment & Plan:     1. Muscle spasm Spasm/trigger points noted on left side. She already has flexeril at home that she will begin. Also has access to a massage therapist through her employer that she is going to see. May consider chiropractic care as well. Continue heat and light stretches. She is to call if there is no improvement in symptoms.  2. Trigger point of shoulder region, left See above medical treatment plan.       Mar Daring, PA-C  [facility]

## 2015-12-26 NOTE — Patient Instructions (Signed)
2 spasm/trigger point areas noted: upper trapezius left side near insertion at acromion and rhomboid left side  Muscle Cramps and Spasms Muscle cramps and spasms occur when a muscle or muscles tighten and you have no control over this tightening (involuntary muscle contraction). They are a common problem and can develop in any muscle. The most common place is in the calf muscles of the leg. Both muscle cramps and muscle spasms are involuntary muscle contractions, but they also have differences:   Muscle cramps are sporadic and painful. They may last a few seconds to a quarter of an hour. Muscle cramps are often more forceful and last longer than muscle spasms.  Muscle spasms may or may not be painful. They may also last just a few seconds or much longer. CAUSES  It is uncommon for cramps or spasms to be due to a serious underlying problem. In many cases, the cause of cramps or spasms is unknown. Some common causes are:   Overexertion.   Overuse from repetitive motions (doing the same thing over and over).   Remaining in a certain position for a long period of time.   Improper preparation, form, or technique while performing a sport or activity.   Dehydration.   Injury.   Side effects of some medicines.   Abnormally low levels of the salts and ions in your blood (electrolytes), especially potassium and calcium. This could happen if you are taking water pills (diuretics) or you are pregnant.  Some underlying medical problems can make it more likely to develop cramps or spasms. These include, but are not limited to:   Diabetes.   Parkinson disease.   Hormone disorders, such as thyroid problems.   Alcohol abuse.   Diseases specific to muscles, joints, and bones.   Blood vessel disease where not enough blood is getting to the muscles.  HOME CARE INSTRUCTIONS   Stay well hydrated. Drink enough water and fluids to keep your urine clear or pale yellow.  It may be  helpful to massage, stretch, and relax the affected muscle.  For tight or tense muscles, use a warm towel, heating pad, or hot shower water directed to the affected area.  If you are sore or have pain after a cramp or spasm, applying ice to the affected area may relieve discomfort.  Put ice in a plastic bag.  Place a towel between your skin and the bag.  Leave the ice on for 15-20 minutes, 03-04 times a day.  Medicines used to treat a known cause of cramps or spasms may help reduce their frequency or severity. Only take over-the-counter or prescription medicines as directed by your caregiver. SEEK MEDICAL CARE IF:  Your cramps or spasms get more severe, more frequent, or do not improve over time.  MAKE SURE YOU:   Understand these instructions.  Will watch your condition.  Will get help right away if you are not doing well or get worse.   This information is not intended to replace advice given to you by your health care provider. Make sure you discuss any questions you have with your health care provider.   Document Released: 09/26/2001 Document Revised: 08/01/2012 Document Reviewed: 03/23/2012 Elsevier Interactive Patient Education [employer].

## 2016-01-14 ENCOUNTER — Other Ambulatory Visit: Payer: Self-pay | Admitting: Family Medicine

## 2016-01-14 DIAGNOSIS — E039 Hypothyroidism, unspecified: Secondary | ICD-10-CM

## 2016-01-17 ENCOUNTER — Other Ambulatory Visit: Payer: Self-pay | Admitting: Neurology

## 2016-01-30 ENCOUNTER — Other Ambulatory Visit: Payer: Self-pay | Admitting: Family Medicine

## 2016-01-30 ENCOUNTER — Other Ambulatory Visit: Payer: Self-pay | Admitting: Physician Assistant

## 2016-01-30 DIAGNOSIS — G4700 Insomnia, unspecified: Secondary | ICD-10-CM

## 2016-01-30 NOTE — Telephone Encounter (Signed)
Rx called in to pharmacy. 

## 2016-02-04 ENCOUNTER — Other Ambulatory Visit: Payer: Self-pay | Admitting: Physician Assistant

## 2016-02-04 DIAGNOSIS — M9979 Connective tissue and disc stenosis of intervertebral foramina of abdomen and other regions: Secondary | ICD-10-CM

## 2016-02-04 MED ORDER — HYDROCODONE-ACETAMINOPHEN 5-300 MG PO TABS: 1.0000 | ORAL_TABLET | Freq: Two times a day (BID) | ORAL | 0 refills | Status: DC | PRN

## 2016-02-04 NOTE — Telephone Encounter (Signed)
Pt needs refill on her hydrocodone.  Please call when ready for pick up.  Thank sTeri

## 2016-02-04 NOTE — Telephone Encounter (Signed)
Patient advised as below.  

## 2016-02-29 ENCOUNTER — Other Ambulatory Visit: Payer: Self-pay | Admitting: Neurology

## 2016-03-02 ENCOUNTER — Other Ambulatory Visit: Payer: Self-pay | Admitting: Neurology

## 2016-03-18 ENCOUNTER — Encounter: Payer: Self-pay | Admitting: Physician Assistant

## 2016-05-08 ENCOUNTER — Other Ambulatory Visit: Payer: Self-pay | Admitting: Physician Assistant

## 2016-05-08 DIAGNOSIS — E559 Vitamin D deficiency, unspecified: Secondary | ICD-10-CM

## 2016-05-13 ENCOUNTER — Other Ambulatory Visit: Payer: Self-pay | Admitting: Neurology

## 2016-05-15 ENCOUNTER — Telehealth: Payer: Self-pay | Admitting: Physician Assistant

## 2016-05-15 DIAGNOSIS — M9979 Connective tissue and disc stenosis of intervertebral foramina of abdomen and other regions: Secondary | ICD-10-CM

## 2016-05-15 MED ORDER — HYDROCODONE-ACETAMINOPHEN 5-300 MG PO TABS: 1.0000 | ORAL_TABLET | Freq: Two times a day (BID) | ORAL | 0 refills | Status: DC | PRN

## 2016-05-15 NOTE — Telephone Encounter (Signed)
LOV 12/26/15, RX last filled 02/04/16-aa

## 2016-05-15 NOTE — Telephone Encounter (Signed)
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  Pt only has 3 pills left.    Pt contacted office for refill request on the following medications:  Hydrocodone-Acetaminophen 5-300 MG TABS.  [id number]

## 2016-05-18 ENCOUNTER — Ambulatory Visit (INDEPENDENT_AMBULATORY_CARE_PROVIDER_SITE_OTHER): Payer: Medicare Other | Admitting: Neurology

## 2016-05-18 ENCOUNTER — Encounter: Payer: Self-pay | Admitting: Neurology

## 2016-05-18 VITALS — BP 165/75 | HR 62 | Ht 63.0 in | Wt 181.0 lb

## 2016-05-18 DIAGNOSIS — G3184 Mild cognitive impairment, so stated: Secondary | ICD-10-CM | POA: Diagnosis not present

## 2016-05-18 MED ORDER — TOPIRAMATE 100 MG PO TABS: 50.0000 mg | ORAL_TABLET | Freq: Every evening | ORAL | 12 refills | Status: DC

## 2016-05-18 NOTE — Patient Instructions (Signed)
Remember to drink plenty of fluid, eat healthy meals and do not skip any meals. Try to eat protein with a every meal and eat a healthy snack such as fruit or nuts in between meals. Try to keep a regular sleep-wake schedule and try to exercise daily, particularly in the form of walking, 20-30 minutes a day, if you can.   As far as your medications are concerned, I would like to suggest: Continue current medications  I would like to see you back in 1 year, sooner if we need to. Please call us with any interim questions, concerns, problems, updates or refill requests.   Our phone number is [phone]. We also have an after hours call service for urgent matters and there is a physician on-call for urgent questions. For any emergencies you know to call 911 or go to the nearest emergency room   

## 2016-05-18 NOTE — Progress Notes (Signed)
[id number]    Provider:  Dr Jaynee Eagles Referring Provider: Margarita Rana, MD Primary Care Physician:  Mar Daring, PA-C  CC: Memory loss  Interval history 05/18/2016; Here for follow up of memory loss likely mild cognitive impairment. She felt better after we decreased the Topamax from 200mg  a day to 50mg  a day. MoCA was 23/30 and MMSE was 30/30 indicating there is likely MCI, the disparity being that the MoCA is more sensitive than the MMSE. We discussed clinical trials again today, we have a new [employer] trial for MCI and she is not interested. No changes in memory. She is still working part time and she feels as though she is doing fine with change and cash, there has been no difference. Discussed MCI and that some people will progress to dementia. Coworkers feel she is stable. She is on 50mg  of the Topamax. Discussed we can keep her on the 50mg  of Topamax, she does not feel her migraines have worsened on lowering the Topamax. I recommended formal memory testing and she spoke to Dr. Bonita Quin and she could not be there a whole day for the testing. She has migraines. She still has 2 migraines a week in the afternoon. She does not wake up with headaches. No snoring that she knows of, no excessive daytime fatigue or somnolence. When she has a headache she takes alleve  Interval history 11/14/2015: Patient comes back today. She is feeling much better as far as memory loss go since decreasing the Topamax from 200-100. She sees no changes in her migraines 100 mg. We'll further decrease Topamax to 50 mg. 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 Montral cognitive assessment score was 23 but Mini-Mental status exam was 30 out of 30. We have clinical trial here, CREAD, discussed at length with patient and he should be a wonderful candidate for this clinical trial which includes imaging and no cost patient including PET amyloid scans which would be very interesting  especially in this patient's case. I discussed this at length, in the research team also came in and discuss with her provided brochures. We will continue to follow her every 6 months.  Interval history 05/06/2015: She is having eye twitching with the Aricept, she had diarrhea with 10mg  and she went back to 5mg . Reviewed labs and MRI images with patient and compared them to 2010 MRI which is mostly stable. Showed chronic microvascular ischemic changes, discussed vascular risk factors and treatment.   She has migraines, every other day last 4-6 hours, unilateral, either side, throbbing and pulsatin with light sensitivity, sounds sensitivity, nausea no vomiting. No aura. No medication obveruse. On Topamax. On Atenolol. Failed Depakote, Pamelor and Elavil, she has failed imitrex oral and takes shots which may or may not help. She can't work, they can be severe 10/10 on average 5-6/10. At least 16 or more migraines a month. For the last 5-10 years or longer.   HPI: Cathy Barnes is a 76 y.o. female here as a referral from Dr. Venia Minks for memory loss. PMHx of migraine, HTN, essential tremor. She is a patient of Dr. [name] and Ward Givens and is seeing me today. Symptoms started within the last 2 months. She is having problems with numbers. She is having some short-term memory changes but most significantly with numbers. 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 She is scared, something is not right. Started acutely and getting worse. She took some prednisone for her foot and then this problem started. That may be a coincidence. She has stopped the steroids. No new medication changes, nothing new. No head trauma or inciting events. No other short-term memory changes except sometimes she forgets little things when she is very tired. Her TSH is monitored by pcp for hypothyroidism. No  snoring at night, not excessively tired during the day, she had a sleep test in the past. No alteration of consciousness. No other focal neurologic symptoms.   Review of Systems: Patient complains of symptoms per HPI as well as the following symptoms: ringing in ears, cramps, memory los, headache, dizziness, tremor. Pertinent negatives per HPI. All others negative.    Social History   Social History  . Marital status: Divorced    Spouse name: N/A  . Number of children: 1  . Years of education: college   Occupational History  . book keeping Other    [employer]   Social History Main Topics  . Smoking status: Never Smoker  . Smokeless tobacco: Never Used  . Alcohol use 0.0 oz/week     Comment: Rare occation   . Drug use: No  . Sexual activity: Not on file   Other Topics Concern  . Not on file   Social History Narrative   Patient is right handed, resides alone. Divorced.    Family History  Problem Relation Age of Onset  . Colon cancer Mother   . Hypertension Mother   . Colon cancer Father   . Heart disease Father   . Hypertension Father     Past Medical History:  Diagnosis Date  . Hyperglycemia   . Hyperlipidemia   . Hypertension   . Migraine, unspecified, without mention of intractable migraine without mention of status migrainosus 12/14/2012  . Osteoarthritis, chronic     Past Surgical History:  Procedure Laterality Date  . BREAST CYST ASPIRATION Left 1993   Removed  . History of Cataract surgery Right 2011  . PARTIAL HYSTERECTOMY    . S/P ACL repair Left    knee  . TUBAL LIGATION  1975   Bilateral    Current Outpatient Prescriptions  Medication Sig Dispense Refill  . ALPRAZolam (XANAX) 0.25 MG tablet take 2 tablets by mouth at bedtime 60 tablet 5  . aspirin 81 MG tablet Take 81 mg by mouth daily.    [name] atenolol (TENORMIN) 50 MG tablet take 1 tablet by mouth once daily 90 tablet 1  . BAYER CONTOUR TEST test strip     . Cholecalciferol  50000 units capsule 1 capsule once a month 3 capsule 1  . cyclobenzaprine (FLEXERIL) 10 MG tablet take 1 tablet by mouth at bedtime 30 tablet 5  . donepezil (ARICEPT) 5 MG tablet take 1 tablet by mouth at bedtime (Patient taking differently: take 1 tablet by mouth in the morning) 30 tablet 11  . ezetimibe (ZETIA) 10 MG tablet take 1 tablet by mouth at bedtime 30 tablet 5  . gabapentin (NEURONTIN) 100 MG capsule take 2 capsules by mouth every morning and 2 AT LUNCH 360 capsule 1  . gabapentin (NEURONTIN) 300 MG capsule take 1 capsule by mouth at bedtime 90 capsule 0  . Hydrocodone-Acetaminophen 5-300 MG TABS Take 1 tablet by mouth 2 (two) times daily as needed. 60 each 0  . levothyroxine (SYNTHROID, LEVOTHROID) 88 MCG tablet take 1 tablet by mouth once daily  90 tablet 1  . losartan (COZAAR) 50 MG tablet take 1 tablet by mouth at bedtime 90 tablet 1  . potassium chloride SA (K-DUR,KLOR-CON) 20 MEQ tablet take 1 tablet by mouth twice a day 180 tablet 1  . topiramate (TOPAMAX) 100 MG tablet take 1 tablet by mouth every evening (Patient taking differently: take 1/2 tablet by mouth every evening) 180 tablet 3  . triamterene-hydrochlorothiazide (MAXZIDE) 75-50 MG tablet TAKE 1/2 TABLET BY MOUTH ONCE DAILY 90 tablet 1  . verapamil (VERELAN PM) 120 MG 24 hr capsule Take 1 capsule (120 mg total) by mouth once. 90 capsule 1  . VESICARE 10 MG tablet take 1 tablet by mouth once daily 90 tablet 4  . zolpidem (AMBIEN) 10 MG tablet Take 1 tablet (10 mg total) by mouth at bedtime. 90 tablet 1   No current facility-administered medications for this visit.     Allergies as of 05/18/2016 - Review Complete 05/18/2016  Allergen Reaction Noted  . Shellfish allergy  12/17/2014    Vitals: BP (!) 165/75 (BP Location: Left Arm, Patient Position: Sitting, Cuff Size: Normal)   Pulse 62   Ht 5\' 3"  (1.6 m)   Wt 181 lb (82.1 kg)   BMI 32.06 kg/m  Last Weight:  Wt Readings from Last 1 Encounters:  05/18/16 181 lb  (82.1 kg)   Last Height:   Ht Readings from Last 1 Encounters:  05/18/16 5\' 3"  (1.6 m)   MMSE - Mini Mental State Exam 05/18/2016 11/14/2015  Orientation to time 5 5  Orientation to Place 5 5  Registration 3 3  Attention/ Calculation 5 5  Recall 3 3  Language- name 2 objects 2 2  Language- repeat 1 1  Language- follow 3 step command 3 3  Language- read & follow direction 1 1  Write a sentence 1 1  Copy design 1 1  Total score 30 30   Montreal Cognitive Assessment  02/07/2015  Visuospatial/ Executive (0/5) 3  Naming (0/3) 2  Attention: Read list of digits (0/2) 2  Attention: Read list of letters (0/1) 1  Attention: Serial 7 subtraction starting at 100 (0/3) 1  Language: Repeat phrase (0/2) 0  Language : Fluency (0/1) 1  Abstraction (0/2) 2  Delayed Recall (0/5) 5  Orientation (0/6) 6  Total 23  Adjusted Score (based on education) 23   Cranial Nerves:  The pupils are equal, round, and reactive to light. Visual fields are full.. Extraocular movements are intact. The face is symmetric. Hearing intact. Voice is normal.  Gait:  Not ataxic  Motor Observation:  No asymmetry, no atrophy, and no involuntary movements noted. Tone:  Normal muscle tone.   Posture:  Posture is normal. normal erect   Assessment/Plan: 76 y.o. female here as a referral from Dr. Venia Minks for memory loss. MMSE 30/30 stable today bit in the past, MoCA 23/30, likely mild cognitive impairment. Ordered MRI of the brain as below, B12 nml, on Aricept. Decreased her Topamax.    MRi of the brain unremarkable, chronic microvascular ischemic white matter changes. Discussed vascular risk factors and trestment (BP control, diet and exercise, statins, watching glucose). Mentioned again today, patient rememebrs conversation. B12, tsh unremarkable  MCI: continue Aricept, may be a great candidate for CREAD clinical trial. Discussed at length with her and had our research team stepped in and  provide written information/brochures on the trial last appointment. Patient declined.  I discussed MCI with her today and that some people progress to dementia but  some do not. If she feels stable then we can monitor her. She appears to be very independent. If she worsens I do recommend formal memory testing.    Cathy Ill, MD  [facility] [address] [place],  [address]  Phone [phone] Fax [phone]  A total of 30 minutes was spent face-to-face with this patient. Over half this time was spent on counseling patient on the mild cognitive impairment diagnosis and different diagnostic and therapeutic options available.

## 2016-05-30 ENCOUNTER — Other Ambulatory Visit: Payer: Self-pay | Admitting: Physician Assistant

## 2016-05-30 ENCOUNTER — Other Ambulatory Visit: Payer: Self-pay | Admitting: Neurology

## 2016-05-30 DIAGNOSIS — I10 Essential (primary) hypertension: Secondary | ICD-10-CM

## 2016-06-02 ENCOUNTER — Other Ambulatory Visit: Payer: Self-pay | Admitting: Neurology

## 2016-06-09 ENCOUNTER — Other Ambulatory Visit: Payer: Self-pay | Admitting: Physician Assistant

## 2016-06-09 DIAGNOSIS — G4700 Insomnia, unspecified: Secondary | ICD-10-CM

## 2016-06-09 NOTE — Telephone Encounter (Signed)
Prescription for Ambien was called in to [facility].  Thanks,  -Joseline

## 2016-06-27 ENCOUNTER — Other Ambulatory Visit: Payer: Self-pay | Admitting: Neurology

## 2016-07-02 ENCOUNTER — Ambulatory Visit (INDEPENDENT_AMBULATORY_CARE_PROVIDER_SITE_OTHER): Payer: Medicare Other | Admitting: Physician Assistant

## 2016-07-02 ENCOUNTER — Encounter: Payer: Self-pay | Admitting: Physician Assistant

## 2016-07-02 ENCOUNTER — Ambulatory Visit (INDEPENDENT_AMBULATORY_CARE_PROVIDER_SITE_OTHER): Payer: Medicare Other

## 2016-07-02 VITALS — BP 154/72 | HR 64 | Temp 97.6°F | Ht 62.75 in | Wt 181.8 lb

## 2016-07-02 DIAGNOSIS — Z0000 Encounter for general adult medical examination without abnormal findings: Secondary | ICD-10-CM

## 2016-07-02 DIAGNOSIS — Z1211 Encounter for screening for malignant neoplasm of colon: Secondary | ICD-10-CM | POA: Diagnosis not present

## 2016-07-02 DIAGNOSIS — E559 Vitamin D deficiency, unspecified: Secondary | ICD-10-CM

## 2016-07-02 DIAGNOSIS — R739 Hyperglycemia, unspecified: Secondary | ICD-10-CM

## 2016-07-02 DIAGNOSIS — I10 Essential (primary) hypertension: Secondary | ICD-10-CM

## 2016-07-02 DIAGNOSIS — E039 Hypothyroidism, unspecified: Secondary | ICD-10-CM

## 2016-07-02 DIAGNOSIS — Z1159 Encounter for screening for other viral diseases: Secondary | ICD-10-CM | POA: Diagnosis not present

## 2016-07-02 DIAGNOSIS — E7800 Pure hypercholesterolemia, unspecified: Secondary | ICD-10-CM

## 2016-07-02 DIAGNOSIS — Z1231 Encounter for screening mammogram for malignant neoplasm of breast: Secondary | ICD-10-CM | POA: Diagnosis not present

## 2016-07-02 DIAGNOSIS — Z1239 Encounter for other screening for malignant neoplasm of breast: Secondary | ICD-10-CM

## 2016-07-02 NOTE — Patient Instructions (Signed)
Health Maintenance for Postmenopausal Women Menopause is a normal process in which your reproductive ability comes to an end. This process happens gradually over a span of months to years, usually between the ages of 33 and 38. Menopause is complete when you have missed 12 consecutive menstrual periods. It is important to talk with your health care provider about some of the most common conditions that affect postmenopausal women, such as heart disease, cancer, and bone loss (osteoporosis). Adopting a healthy lifestyle and getting preventive care can help to promote your health and wellness. Those actions can also lower your chances of developing some of these common conditions. What should I know about menopause? During menopause, you may experience a number of symptoms, such as:  Moderate-to-severe hot flashes.  Night sweats.  Decrease in sex drive.  Mood swings.  Headaches.  Tiredness.  Irritability.  Memory problems.  Insomnia. Choosing to treat or not to treat menopausal changes is an individual decision that you make with your health care provider. What should I know about hormone replacement therapy and supplements? Hormone therapy products are effective for treating symptoms that are associated with menopause, such as hot flashes and night sweats. Hormone replacement carries certain risks, especially as you become older. If you are thinking about using estrogen or estrogen with progestin treatments, discuss the benefits and risks with your health care provider. What should I know about heart disease and stroke? Heart disease, heart attack, and stroke become more likely as you age. This may be due, in part, to the hormonal changes that your body experiences during menopause. These can affect how your body processes dietary fats, triglycerides, and cholesterol. Heart attack and stroke are both medical emergencies. There are many things that you can do to help prevent heart disease  and stroke:  Have your blood pressure checked at least every 1-2 years. High blood pressure causes heart disease and increases the risk of stroke.  If you are 48-61 years old, ask your health care provider if you should take aspirin to prevent a heart attack or a stroke.  Do not use any tobacco products, including cigarettes, chewing tobacco, or electronic cigarettes. If you need help quitting, ask your health care provider.  It is important to eat a healthy diet and maintain a healthy weight.  Be sure to include plenty of vegetables, fruits, low-fat dairy products, and lean protein.  Avoid eating foods that are high in solid fats, added sugars, or salt (sodium).  Get regular exercise. This is one of the most important things that you can do for your health.  Try to exercise for at least 150 minutes each week. The type of exercise that you do should increase your heart rate and make you sweat. This is known as moderate-intensity exercise.  Try to do strengthening exercises at least twice each week. Do these in addition to the moderate-intensity exercise.  Know your numbers.Ask your health care provider to check your cholesterol and your blood glucose. Continue to have your blood tested as directed by your health care provider. What should I know about cancer screening? There are several types of cancer. Take the following steps to reduce your risk and to catch any cancer development as early as possible. Breast Cancer  Practice breast self-awareness.  This means understanding how your breasts normally appear and feel.  It also means doing regular breast self-exams. Let your health care provider know about any changes, no matter how small.  If you are 40 or older,  have a clinician do a breast exam (clinical breast exam or CBE) every year. Depending on your age, family history, and medical history, it may be recommended that you also have a yearly breast X-ray (mammogram).  If you  have a family history of breast cancer, talk with your health care provider about genetic screening.  If you are at high risk for breast cancer, talk with your health care provider about having an MRI and a mammogram every year.  Breast cancer (BRCA) gene test is recommended for women who have family members with BRCA-related cancers. Results of the assessment will determine the need for genetic counseling and BRCA1 and for BRCA2 testing. BRCA-related cancers include these types:  Breast. This occurs in males or females.  Ovarian.  Tubal. This may also be called fallopian tube cancer.  Cancer of the abdominal or pelvic lining (peritoneal cancer).  Prostate.  Pancreatic. Cervical, Uterine, and Ovarian Cancer  Your health care provider may recommend that you be screened regularly for cancer of the pelvic organs. These include your ovaries, uterus, and vagina. This screening involves a pelvic exam, which includes checking for microscopic changes to the surface of your cervix (Pap test).  For women ages 21-65, health care providers may recommend a pelvic exam and a Pap test every three years. For women ages 23-65, they may recommend the Pap test and pelvic exam, combined with testing for human papilloma virus (HPV), every five years. Some types of HPV increase your risk of cervical cancer. Testing for HPV may also be done on women of any age who have unclear Pap test results.  Other health care providers may not recommend any screening for nonpregnant women who are considered low risk for pelvic cancer and have no symptoms. Ask your health care provider if a screening pelvic exam is right for you.  If you have had past treatment for cervical cancer or a condition that could lead to cancer, you need Pap tests and screening for cancer for at least 20 years after your treatment. If Pap tests have been discontinued for you, your risk factors (such as having a new sexual partner) need to be reassessed  to determine if you should start having screenings again. Some women have medical problems that increase the chance of getting cervical cancer. In these cases, your health care provider may recommend that you have screening and Pap tests more often.  If you have a family history of uterine cancer or ovarian cancer, talk with your health care provider about genetic screening.  If you have vaginal bleeding after reaching menopause, tell your health care provider.  There are currently no reliable tests available to screen for ovarian cancer. Lung Cancer  Lung cancer screening is recommended for adults 99-83 years old who are at high risk for lung cancer because of a history of smoking. A yearly low-dose CT scan of the lungs is recommended if you:  Currently smoke.  Have a history of at least 30 pack-years of smoking and you currently smoke or have quit within the past 15 years. A pack-year is smoking an average of one pack of cigarettes per day for one year. Yearly screening should:  Continue until it has been 15 years since you quit.  Stop if you develop a health problem that would prevent you from having lung cancer treatment. Colorectal Cancer  This type of cancer can be detected and can often be prevented.  Routine colorectal cancer screening usually begins at age 72 and continues  through age 75.  If you have risk factors for colon cancer, your health care provider may recommend that you be screened at an earlier age.  If you have a family history of colorectal cancer, talk with your health care provider about genetic screening.  Your health care provider may also recommend using home test kits to check for hidden blood in your stool.  A small camera at the end of a tube can be used to examine your colon directly (sigmoidoscopy or colonoscopy). This is done to check for the earliest forms of colorectal cancer.  Direct examination of the colon should be repeated every 5-10 years until  age 75. However, if early forms of precancerous polyps or small growths are found or if you have a family history or genetic risk for colorectal cancer, you may need to be screened more often. Skin Cancer  Check your skin from head to toe regularly.  Monitor any moles. Be sure to tell your health care provider:  About any new moles or changes in moles, especially if there is a change in a mole's shape or color.  If you have a mole that is larger than the size of a pencil eraser.  If any of your family members has a history of skin cancer, especially at a young age, talk with your health care provider about genetic screening.  Always use sunscreen. Apply sunscreen liberally and repeatedly throughout the day.  Whenever you are outside, protect yourself by wearing long sleeves, pants, a wide-brimmed hat, and sunglasses. What should I know about osteoporosis? Osteoporosis is a condition in which bone destruction happens more quickly than new bone creation. After menopause, you may be at an increased risk for osteoporosis. To help prevent osteoporosis or the bone fractures that can happen because of osteoporosis, the following is recommended:  If you are 19-50 years old, get at least 1,000 mg of calcium and at least 600 mg of vitamin D per day.  If you are older than age 50 but younger than age 70, get at least 1,200 mg of calcium and at least 600 mg of vitamin D per day.  If you are older than age 70, get at least 1,200 mg of calcium and at least 800 mg of vitamin D per day. Smoking and excessive alcohol intake increase the risk of osteoporosis. Eat foods that are rich in calcium and vitamin D, and do weight-bearing exercises several times each week as directed by your health care provider. What should I know about how menopause affects my mental health? Depression may occur at any age, but it is more common as you become older. Common symptoms of depression include:  Low or sad  mood.  Changes in sleep patterns.  Changes in appetite or eating patterns.  Feeling an overall lack of motivation or enjoyment of activities that you previously enjoyed.  Frequent crying spells. Talk with your health care provider if you think that you are experiencing depression. What should I know about immunizations? It is important that you get and maintain your immunizations. These include:  Tetanus, diphtheria, and pertussis (Tdap) booster vaccine.  Influenza every year before the flu season begins.  Pneumonia vaccine.  Shingles vaccine. Your health care provider may also recommend other immunizations. This information is not intended to replace advice given to you by your health care provider. Make sure you discuss any questions you have with your health care provider. Document Released: 05/29/2005 Document Revised: 10/25/2015 Document Reviewed: 01/08/2015 Elsevier Interactive Patient   Education  2017 [employer]  

## 2016-07-02 NOTE — Patient Instructions (Signed)
Cathy Barnes , Thank you for taking time to come for your Medicare Wellness Visit. I appreciate your ongoing commitment to your health goals. Please review the following plan we discussed and let me know if I can assist you in the future.   Screening recommendations/referrals: Colonoscopy 04/23/2009 Mammogram 03/18/2016 Bone Density 04/30/2014 Recommended yearly ophthalmology/optometry visit for glaucoma screening and checkup Recommended yearly dental visit for hygiene and checkup  Vaccinations: Influenza vaccine- done Pneumococcal vaccine- completed Tdap vaccine- done Shingles vaccine- completed  Advanced directives: Need copy at office. Please bring to your next visit or at your earliest convience.    Next appointment: Today @ 2:00 with [place] 65 Years and Older, Female Preventive care refers to lifestyle choices and visits with your health care provider that can promote health and wellness. What does preventive care include?  A yearly physical exam. This is also called an annual well check.  Dental exams once or twice a year.  Routine eye exams. Ask your health care provider how often you should have your eyes checked.  Personal lifestyle choices, including:  Daily care of your teeth and gums.  Regular physical activity.  Eating a healthy diet.  Avoiding tobacco and drug use.  Limiting alcohol use.  Practicing safe sex.  Taking low-dose aspirin every day.  Taking vitamin and mineral supplements as recommended by your health care provider. What happens during an annual well check? The services and screenings done by your health care provider during your annual well check will depend on your age, overall health, lifestyle risk factors, and family history of disease. Counseling  Your health care provider may ask you questions about your:  Alcohol use.  Tobacco use.  Drug use.  Emotional well-being.  Home and relationship  well-being.  Sexual activity.  Eating habits.  History of falls.  Memory and ability to understand (cognition).  Work and work Statistician.  Reproductive health. Screening  You may have the following tests or measurements:  Height, weight, and BMI.  Blood pressure.  Lipid and cholesterol levels. These may be checked every 5 years, or more frequently if you are over 46 years old.  Skin check.  Lung cancer screening. You may have this screening every year starting at age 47 if you have a 30-pack-year history of smoking and currently smoke or have quit within the past 15 years.  Fecal occult blood test (FOBT) of the stool. You may have this test every year starting at age 56.  Flexible sigmoidoscopy or colonoscopy. You may have a sigmoidoscopy every 5 years or a colonoscopy every 10 years starting at age 18.  Hepatitis C blood test.  Hepatitis B blood test.  Sexually transmitted disease (STD) testing.  Diabetes screening. This is done by checking your blood sugar (glucose) after you have not eaten for a while (fasting). You may have this done every 1-3 years.  Bone density scan. This is done to screen for osteoporosis. You may have this done starting at age 97.  Mammogram. This may be done every 1-2 years. Talk to your health care provider about how often you should have regular mammograms. Talk with your health care provider about your test results, treatment options, and if necessary, the need for more tests. Vaccines  Your health care provider may recommend certain vaccines, such as:  Influenza vaccine. This is recommended every year.  Tetanus, diphtheria, and acellular pertussis (Tdap, Td) vaccine. You may need a Td booster every 10 years.  Zoster  vaccine. You may need this after age 92.  Pneumococcal 13-valent conjugate (PCV13) vaccine. One dose is recommended after age 72.  Pneumococcal polysaccharide (PPSV23) vaccine. One dose is recommended after age  63. Talk to your health care provider about which screenings and vaccines you need and how often you need them. This information is not intended to replace advice given to you by your health care provider. Make sure you discuss any questions you have with your health care provider. Document Released: 05/03/2015 Document Revised: 12/25/2015 Document Reviewed: 02/05/2015 Elsevier Interactive Patient Education  2017 [employer] Prevention in the Home Falls can cause injuries. They can happen to people of all ages. There are many things you can do to make your home safe and to help prevent falls. What can I do on the outside of my home?  Regularly fix the edges of walkways and driveways and fix any cracks.  Remove anything that might make you trip as you walk through a door, such as a raised step or threshold.  Trim any bushes or trees on the path to your home.  Use bright outdoor lighting.  Clear any walking paths of anything that might make someone trip, such as rocks or tools.  Regularly check to see if handrails are loose or broken. Make sure that both sides of any steps have handrails.  Any raised decks and porches should have guardrails on the edges.  Have any leaves, snow, or ice cleared regularly.  Use sand or salt on walking paths during winter.  Clean up any spills in your garage right away. This includes oil or grease spills. What can I do in the bathroom?  Use night lights.  Install grab bars by the toilet and in the tub and shower. Do not use towel bars as grab bars.  Use non-skid mats or decals in the tub or shower.  If you need to sit down in the shower, use a plastic, non-slip stool.  Keep the floor dry. Clean up any water that spills on the floor as soon as it happens.  Remove soap buildup in the tub or shower regularly.  Attach bath mats securely with double-sided non-slip rug tape.  Do not have throw rugs and other things on the floor that can make  you trip. What can I do in the bedroom?  Use night lights.  Make sure that you have a light by your bed that is easy to reach.  Do not use any sheets or blankets that are too big for your bed. They should not hang down onto the floor.  Have a firm chair that has side arms. You can use this for support while you get dressed.  Do not have throw rugs and other things on the floor that can make you trip. What can I do in the kitchen?  Clean up any spills right away.  Avoid walking on wet floors.  Keep items that you use a lot in easy-to-reach places.  If you need to reach something above you, use a strong step stool that has a grab bar.  Keep electrical cords out of the way.  Do not use floor polish or wax that makes floors slippery. If you must use wax, use non-skid floor wax.  Do not have throw rugs and other things on the floor that can make you trip. What can I do with my stairs?  Do not leave any items on the stairs.  Make sure that there are handrails  on both sides of the stairs and use them. Fix handrails that are broken or loose. Make sure that handrails are as long as the stairways.  Check any carpeting to make sure that it is firmly attached to the stairs. Fix any carpet that is loose or worn.  Avoid having throw rugs at the top or bottom of the stairs. If you do have throw rugs, attach them to the floor with carpet tape.  Make sure that you have a light switch at the top of the stairs and the bottom of the stairs. If you do not have them, ask someone to add them for you. What else can I do to help prevent falls?  Wear shoes that:  Do not have high heels.  Have rubber bottoms.  Are comfortable and fit you well.  Are closed at the toe. Do not wear sandals.  If you use a stepladder:  Make sure that it is fully opened. Do not climb a closed stepladder.  Make sure that both sides of the stepladder are locked into place.  Ask someone to hold it for you, if  possible.  Clearly mark and make sure that you can see:  Any grab bars or handrails.  First and last steps.  Where the edge of each step is.  Use tools that help you move around (mobility aids) if they are needed. These include:  Canes.  Walkers.  Scooters.  Crutches.  Turn on the lights when you go into a dark area. Replace any light bulbs as soon as they burn out.  Set up your furniture so you have a clear path. Avoid moving your furniture around.  If any of your floors are uneven, fix them.  If there are any pets around you, be aware of where they are.  Review your medicines with your doctor. Some medicines can make you feel dizzy. This can increase your chance of falling. Ask your doctor what other things that you can do to help prevent falls. This information is not intended to replace advice given to you by your health care provider. Make sure you discuss any questions you have with your health care provider. Document Released: 01/31/2009 Document Revised: 09/12/2015 Document Reviewed: 05/11/2014 Elsevier Interactive Patient Education  2017 [employer].

## 2016-07-02 NOTE — Progress Notes (Signed)
Subjective:   Cathy Barnes is a 76 y.o. female who presents for Medicare Annual (Subsequent) preventive examination.  Review of Systems:  N/A  Cardiac Risk Factors include: advanced age (>78men, >8 women);hypertension;dyslipidemia;obesity (BMI >30kg/m2)     Objective:     Vitals: BP (!) 154/72 (BP Location: Right Arm)   Pulse 64   Temp 97.6 F (36.4 C) (Oral)   Ht 5' 2.75" (1.594 m)   Wt 181 lb 12.8 oz (82.5 kg)   BMI 32.46 kg/m   Body mass index is 32.46 kg/m.   Tobacco History  Smoking Status  . Never Smoker  Smokeless Tobacco  . Never Used     Counseling given: Not Answered   Past Medical History:  Diagnosis Date  . Hyperglycemia   . Hyperlipidemia   . Hypertension   . Migraine, unspecified, without mention of intractable migraine without mention of status migrainosus 12/14/2012  . Osteoarthritis, chronic    Past Surgical History:  Procedure Laterality Date  . BREAST CYST ASPIRATION Left 1993   Removed  . History of Cataract surgery Right 2011  . PARTIAL HYSTERECTOMY    . S/P ACL repair Left    knee  . TUBAL LIGATION  1975   Bilateral   Family History  Problem Relation Age of Onset  . Colon cancer Mother   . Hypertension Mother   . Colon cancer Father   . Heart disease Father   . Hypertension Father    History  Sexual Activity  . Sexual activity: Not on file    Outpatient Encounter Prescriptions as of 07/02/2016  Medication Sig  . ALPRAZolam (XANAX) 0.25 MG tablet take 2 tablets by mouth at bedtime  . aspirin 81 MG tablet Take 81 mg by mouth daily.  [name] atenolol (TENORMIN) 50 MG tablet take 1 tablet by mouth once daily  . BAYER CONTOUR TEST test strip   . Cholecalciferol 50000 units capsule 1 capsule once a month  . cyclobenzaprine (FLEXERIL) 10 MG tablet take 1 tablet by mouth at bedtime  . donepezil (ARICEPT) 5 MG tablet take 1 tablet by mouth at bedtime (Patient taking differently: take 1 tablet by mouth in the morning)  . ezetimibe  (ZETIA) 10 MG tablet take 1 tablet by mouth at bedtime  . gabapentin (NEURONTIN) 100 MG capsule take 2 capsules by mouth every morning and 2 AT LUNCH  . gabapentin (NEURONTIN) 300 MG capsule take 1 capsule by mouth at bedtime  . Hydrocodone-Acetaminophen 5-300 MG TABS Take 1 tablet by mouth 2 (two) times daily as needed.  [name] levothyroxine (SYNTHROID, LEVOTHROID) 88 MCG tablet take 1 tablet by mouth once daily  . losartan (COZAAR) 50 MG tablet take 1 tablet by mouth at bedtime  . potassium chloride SA (K-DUR,KLOR-CON) 20 MEQ tablet take 1 tablet by mouth twice a day  . topiramate (TOPAMAX) 100 MG tablet Take 0.5 tablets (50 mg total) by mouth every evening. (Patient taking differently: Take 25 mg by mouth every evening. )  . triamterene-hydrochlorothiazide (MAXZIDE) 75-50 MG tablet TAKE 1/2 TABLET BY MOUTH ONCE DAILY  . verapamil (VERELAN PM) 120 MG 24 hr capsule take 1 capsule by mouth once daily  . VESICARE 10 MG tablet take 1 tablet by mouth once daily  . zolpidem (AMBIEN) 10 MG tablet take 1 tablet by mouth at bedtime   No facility-administered encounter medications on file as of 07/02/2016.     Activities of Daily Living In your present state of health, do you have any  difficulty performing the following activities: 07/02/2016  Hearing? Y  Vision? N  Difficulty concentrating or making decisions? Y  Walking or climbing stairs? N  Dressing or bathing? N  Doing errands, shopping? N  Preparing Food and eating ? N  Using the Toilet? N  In the past six months, have you accidently leaked urine? N  Do you have problems with loss of bowel control? N  Managing your Medications? N  Managing your Finances? N  Housekeeping or managing your Housekeeping? N  Some recent data might be hidden    Patient Care Team: Mar Daring, PA-C as PCP - General (Family Medicine) Monna Fam, MD as Consulting Physician (Ophthalmology) [name], MD as Consulting Physician (Neurology)      Assessment:    Exercise Activities and Dietary recommendations Current Exercise Habits: The patient does not participate in regular exercise at present (walking and on feet all day at work), Exercise limited by: None identified  Goals    . Increase water intake          Recommend increasing water intake to 3 glasses a day.      Fall Risk Fall Risk  07/02/2016 01/28/2015  Falls in the past year? No No   Depression Screen PHQ 2/9 Scores 07/02/2016 07/02/2016 01/28/2015  PHQ - 2 Score 0 0 0  PHQ- 9 Score 0 - -     Cognitive Function MMSE - Mini Mental State Exam 05/18/2016 11/14/2015  Orientation to time 5 5  Orientation to Place 5 5  Registration 3 3  Attention/ Calculation 5 5  Recall 3 3  Language- name 2 objects 2 2  Language- repeat 1 1  Language- follow 3 step command 3 3  Language- read & follow direction 1 1  Write a sentence 1 1  Copy design 1 1  Total score 30 30   Montreal Cognitive Assessment  02/07/2015  Visuospatial/ Executive (0/5) 3  Naming (0/3) 2  Attention: Read list of digits (0/2) 2  Attention: Read list of letters (0/1) 1  Attention: Serial 7 subtraction starting at 100 (0/3) 1  Language: Repeat phrase (0/2) 0  Language : Fluency (0/1) 1  Abstraction (0/2) 2  Delayed Recall (0/5) 5  Orientation (0/6) 6  Total 23  Adjusted Score (based on education) 23   6CIT Screen 07/02/2016  What Year? 0 points  What month? 0 points  What time? 0 points  Count back from 20 0 points  Months in reverse 0 points  Repeat phrase 0 points  Total Score 0    Immunization History  Administered Date(s) Administered  . Influenza Split 01/09/2010, 12/24/2011  . Influenza, High Dose Seasonal PF 01/07/2014  . Pneumococcal Conjugate-13 06/25/2014  . Pneumococcal Polysaccharide-23 05/04/2011  . Td 04/18/2004  . Tdap 11/09/2012   Screening Tests Health Maintenance  Topic Date Due  . MAMMOGRAM  03/16/2018  . COLONOSCOPY  04/24/2019  . TETANUS/TDAP  11/10/2022   . INFLUENZA VACCINE  Completed  . DEXA SCAN  Completed  . PNA vac Low Risk Adult  Completed      Plan:  I have personally reviewed and addressed the Medicare Annual Wellness questionnaire and have noted the following in the patient's chart:  A. Medical and social history B. Use of alcohol, tobacco or illicit drugs  C. Current medications and supplements D. Functional ability and status E.  Nutritional status F.  Physical activity G. Advance directives H. List of other physicians I.  Hospitalizations, surgeries, and  ER visits in previous 12 months J.  [place] such as hearing and vision if needed, cognitive and depression L. Referrals and appointments - none  In addition, I have reviewed and discussed with patient certain preventive protocols, quality metrics, and best practice recommendations. A written personalized care plan for preventive services as well as general preventive health recommendations were provided to patient.  See attached scanned questionnaire for additional information.   Signed,  Fabio Neighbors, LPN Nurse Health Advisor   MD Recommendations: None.  I have reviewed the documentation and information obtained by Fabio Neighbors, LPN in the above chart and agree as above. I was available for consultation if any questions or issues arose.  Fenton Malling, PA-C

## 2016-07-02 NOTE — Progress Notes (Signed)
[name], Female    DOB: [birth year], 76 y.o.   MRN: [id number] Visit Date: 07/02/2016  Today's Provider: Mar Daring, PA-C   Chief Complaint  Patient presents with  . Annual Exam   Subjective:    Annual physical exam Cathy Barnes is a 76 y.o. female who presents today for health maintenance and complete physical. She feels well. She reports exercising none,(walking and on feet all day at work).  She reports she is sleeping well. -----------------------------------------------------------------  Review of Systems  Constitutional: Negative.   HENT: Negative.   Eyes: Negative.   Respiratory: Negative.   Cardiovascular: Negative.   Gastrointestinal: Negative.   Endocrine: Negative.   Genitourinary: Negative.   Musculoskeletal: Negative.   Skin: Negative.   Allergic/Immunologic: Negative.   Neurological: Negative.   Hematological: Negative.   Psychiatric/Behavioral: Negative.     Social History      She  reports that she has never smoked. She has never used smokeless tobacco. She reports that she drinks alcohol. She reports that she does not use drugs.       Social History   Social History  . Marital status: Divorced    Spouse name: N/A  . Number of children: 1  . Years of education: college   Occupational History  . book keeping Other    [employer]   Social History Main Topics  . Smoking status: Never Smoker  . Smokeless tobacco: Never Used  . Alcohol use 0.0 oz/week     Comment: Rare- wine on occasion  . Drug use: No  . Sexual activity: Not Asked   Other Topics Concern  . None   Social History Narrative   Patient is right handed, resides alone. Divorced.    Past Medical History:  Diagnosis Date  . Hyperglycemia   . Hyperlipidemia   . Hypertension   . Migraine, unspecified, without mention of intractable migraine without mention of status migrainosus 12/14/2012  . Osteoarthritis, chronic      Patient  Active Problem List   Diagnosis Date Noted  . Cellulitis 08/22/2015  . 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 Overactive bladder 05/30/2015  . Mild cognitive impairment 02/07/2015  . Bloodgood disease 12/25/2014  . Colon polyp 12/25/2014  . 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 Cramps of lower extremity 12/25/2014  . Abnormal cerebrospinal fluid 12/25/2014  . Narrowing of intervertebral disc space 12/25/2014  . Contracture of palmar fascia (Dupuytren's) 12/25/2014  . Breath shortness 12/25/2014  . Essential (primary) hypertension 12/25/2014  . Benign essential tremor 12/25/2014  . Abnormal gait 12/25/2014  . Hypercholesteremia 12/25/2014  . Blood glucose elevated 12/25/2014  . Decreased potassium in the blood 12/25/2014  . Hypothyroidism 12/25/2014  . Insomnia 12/25/2014  . Headache, migraine, intractable 12/25/2014  . 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 Fibrositis 12/25/2014  . Discoloration of nail 12/25/2014  . 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 Arthritis sicca 12/25/2014  . Burning or prickling sensation 12/25/2014  . Post menopausal syndrome 12/25/2014  . Avitaminosis D 12/25/2014  . Hyperlipemia 10/19/2014  . Migraine 12/14/2012    Past Surgical History:  Procedure Laterality Date  . BREAST CYST ASPIRATION Left 1993   Removed  . History of Cataract surgery Right 2011  . PARTIAL HYSTERECTOMY    . S/P ACL repair Left    knee  . TUBAL LIGATION  1975   Bilateral    Family History        Family Status  Relation Status  . Mother Deceased at age 27   colon cancer  . Father Deceased at age 56   colon cancer  . Maternal Grandmother Deceased   Died from old age  . Paternal Grandmother Deceased at age 15   died from old age  . Paternal Grandfather Deceased   Brain Tumor        Her family history includes Colon cancer in her father and mother; Heart disease in her father; Hypertension in her father and mother.     Allergies    Allergen Reactions  . Shellfish Allergy     Throat closes, rash     Current Outpatient Prescriptions:  .  ALPRAZolam (XANAX) 0.25 MG tablet, take 2 tablets by mouth at bedtime, Disp: 60 tablet, Rfl: 5 .  aspirin 81 MG tablet, Take 81 mg by mouth daily., Disp: , Rfl:  .  atenolol (TENORMIN) 50 MG tablet, take 1 tablet by mouth once daily, Disp: 90 tablet, Rfl: 1 .  BAYER CONTOUR TEST test strip, , Disp: , Rfl:  .  Cholecalciferol 50000 units capsule, 1 capsule once a month, Disp: 3 capsule, Rfl: 1 .  cyclobenzaprine (FLEXERIL) 10 MG tablet, take 1 tablet by mouth at bedtime, Disp: 30 tablet, Rfl: 11 .  donepezil (ARICEPT) 5 MG tablet, take 1 tablet by mouth at bedtime (Patient taking differently: take 1 tablet by mouth in the morning), Disp: 30 tablet, Rfl: 11 .  ezetimibe (ZETIA) 10 MG tablet, take 1 tablet by mouth at bedtime, Disp: 30 tablet, Rfl: 5 .  gabapentin (NEURONTIN) 100 MG capsule, take 2 capsules by mouth every morning and 2 AT LUNCH, Disp: 360 capsule, Rfl: 1 .  gabapentin (NEURONTIN) 300 MG capsule, take 1 capsule by mouth at bedtime, Disp: 90 capsule, Rfl: 3 .  Hydrocodone-Acetaminophen 5-300 MG TABS, Take 1 tablet by mouth 2 (two) times daily as needed., Disp: 60 each, Rfl: 0 .  levothyroxine (SYNTHROID, LEVOTHROID) 88 MCG tablet, take 1 tablet by mouth once daily, Disp: 90 tablet, Rfl: 1 .  losartan (COZAAR) 50 MG tablet, take 1 tablet by mouth at bedtime, Disp: 90 tablet, Rfl: 1 .  potassium chloride SA (K-DUR,KLOR-CON) 20 MEQ tablet, take 1 tablet by mouth twice a day, Disp: 180 tablet, Rfl: 1 .  topiramate (TOPAMAX) 100 MG tablet, Take 0.5 tablets (50 mg total) by mouth every evening. (Patient taking differently: Take 25 mg by mouth every evening. ), Disp: 30 tablet, Rfl: 12 .  triamterene-hydrochlorothiazide (MAXZIDE) 75-50 MG tablet, TAKE 1/2 TABLET BY MOUTH ONCE DAILY, Disp: 90 tablet, Rfl: 1 .  verapamil (VERELAN PM) 120 MG 24 hr capsule, take 1 capsule by mouth once  daily, Disp: 90 capsule, Rfl: 3 .  VESICARE 10 MG tablet, take 1 tablet by mouth once daily, Disp: 90 tablet, Rfl: 4 .  zolpidem (AMBIEN) 10 MG tablet, take 1 tablet by mouth at bedtime, Disp: 90 tablet, Rfl: 1   Patient Care Team: Mar Daring, PA-C as PCP - General (Family Medicine) Monna Fam, MD as Consulting Physician (Ophthalmology) [name], MD as Consulting Physician (Neurology)      Objective:   Vitals: BP (!) 154/72 (BP Location: Right Arm)   Pulse 64   Temp 97.6 F (36.4 C) (Oral)   Ht 5' 2.75" (1.594 m)   Wt 181 lb 12.8 oz (82.5 kg)   BMI 32.46 kg/m   Body mass index is 32.46 kg/m.  Physical Exam  Constitutional: She is oriented to person,  place, and time. She appears well-developed and well-nourished. No distress.  HENT:  Head: Normocephalic and atraumatic.  Right Ear: Hearing, tympanic membrane, external ear and ear canal normal.  Left Ear: Hearing, tympanic membrane, external ear and ear canal normal.  Nose: Nose normal.  Mouth/Throat: Uvula is midline, oropharynx is clear and moist and mucous membranes are normal. No oropharyngeal exudate.  Eyes: Conjunctivae and EOM are normal. Pupils are equal, round, and reactive to light. Right eye exhibits no discharge. Left eye exhibits no discharge. No scleral icterus.  Neck: Normal range of motion. Neck supple. No JVD present. Carotid bruit is not present. No tracheal deviation present. No thyromegaly present.  Cardiovascular: Normal rate, regular rhythm, normal heart sounds and intact distal pulses.  Exam reveals no gallop and no friction rub.   No murmur heard. Pulmonary/Chest: Effort normal and breath sounds normal. No respiratory distress. She has no wheezes. She has no rales. She exhibits no tenderness.  Abdominal: Soft. Bowel sounds are normal. She exhibits no distension and no mass. There is no tenderness. There is no rebound and no guarding.  Musculoskeletal: Normal range of motion. She exhibits no  edema or tenderness.  Lymphadenopathy:    She has no cervical adenopathy.  Neurological: She is alert and oriented to person, place, and time.  Skin: Skin is warm and dry. No rash noted. She is not diaphoretic.  Psychiatric: She has a normal mood and affect. Her behavior is normal. Judgment and thought content normal.  Vitals reviewed.    Depression Screen PHQ 2/9 Scores 07/02/2016 07/02/2016 01/28/2015  PHQ - 2 Score 0 0 0  PHQ- 9 Score 0 - -      Assessment & Plan:     Routine Health Maintenance and Physical Exam  Exercise Activities and Dietary recommendations Goals    . Increase water intake          Recommend increasing water intake to 3 glasses a day.       Immunization History  Administered Date(s) Administered  . Influenza Split 01/09/2010, 12/24/2011  . Influenza, High Dose Seasonal PF 01/07/2014  . Pneumococcal Conjugate-13 06/25/2014  . Pneumococcal Polysaccharide-23 05/04/2011  . Td 04/18/2004  . Tdap 11/09/2012    Health Maintenance  Topic Date Due  . MAMMOGRAM  03/16/2018  . COLONOSCOPY  04/24/2019  . TETANUS/TDAP  11/10/2022  . INFLUENZA VACCINE  Completed  . DEXA SCAN  Completed  . PNA vac Low Risk Adult  Completed     Discussed health benefits of physical activity, and encouraged her to engage in regular exercise appropriate for her age and condition.    1. Annual physical exam Normal physical exam today. Will check labs as below and f/u pending lab results. If labs are stable and WNL she will not need to have these rechecked for one year at her next annual physical exam. She is to call the office in the meantime if she has any acute issue, questions or concerns.  2. Breast cancer screening Patient is due for mammogram and BMD for osteopenia in Nov 2018. She gets this done through [facility].   3. Colon cancer screening She will call [facility] in [facility] to schedule her repeat colonoscopy with Dr. Fuller Plan.  She does have family history in her mother and father.   4. Essential (primary) hypertension Stable. Continue verapamil 120mg , maxzide 75-50mg , losartan 50mg , and atenolol 50mg . Will check labs as below and f/u pending results. - CBC w/Diff/Platelet - Comprehensive  Metabolic Panel (CMET)  5. Hypothyroidism, unspecified type Continue levothyroxine 48mcg. Will check labs as below and f/u pending results. - TSH  6. Blood glucose elevated Will check labs as below and f/u pending results. - Comprehensive Metabolic Panel (CMET) - HgB A1c  7. Avitaminosis D H/O deficiency with osteopenia and estrogen def bone loss. She is taking Vit D 50,000 IU monthly. Will check labs as below and f/u pending results. - CBC w/Diff/Platelet - Vitamin D (25 hydroxy)  8. Hypercholesteremia Stable with zetia 10mg . Will check labs as below and f/u pending results. - Comprehensive Metabolic Panel (CMET) - Lipid Profile  9. Need for hepatitis C screening test - Hepatitis C Antibody  --------------------------------------------------------------------    Mar Daring, PA-C  [facility]

## 2016-07-08 ENCOUNTER — Other Ambulatory Visit: Payer: Self-pay | Admitting: Physician Assistant

## 2016-07-08 DIAGNOSIS — E039 Hypothyroidism, unspecified: Secondary | ICD-10-CM

## 2016-07-10 LAB — TSH: TSH: 2.56 u[IU]/mL (ref 0.450–4.500)

## 2016-07-10 LAB — CBC WITH DIFFERENTIAL/PLATELET
BASOS ABS: 0 10*3/uL (ref 0.0–0.2)
Basos: 0 %
EOS (ABSOLUTE): 1 10*3/uL — AB (ref 0.0–0.4)
Eos: 9 %
Hematocrit: 39.9 % (ref 34.0–46.6)
Hemoglobin: 13.6 g/dL (ref 11.1–15.9)
Immature Grans (Abs): 0 10*3/uL (ref 0.0–0.1)
Immature Granulocytes: 0 %
LYMPHS ABS: 6.2 10*3/uL — AB (ref 0.7–3.1)
Lymphs: 52 %
MCH: 30.2 pg (ref 26.6–33.0)
MCHC: 34.1 g/dL (ref 31.5–35.7)
MCV: 89 fL (ref 79–97)
MONOS ABS: 0.6 10*3/uL (ref 0.1–0.9)
Monocytes: 5 %
NEUTROS ABS: 4.1 10*3/uL (ref 1.4–7.0)
Neutrophils: 34 %
PLATELETS: 262 10*3/uL (ref 150–379)
RBC: 4.5 x10E6/uL (ref 3.77–5.28)
RDW: 14 % (ref 12.3–15.4)
WBC: 12.1 10*3/uL — ABNORMAL HIGH (ref 3.4–10.8)

## 2016-07-10 LAB — COMPREHENSIVE METABOLIC PANEL
ALBUMIN: 3.9 g/dL (ref 3.5–4.8)
ALK PHOS: 119 IU/L — AB (ref 39–117)
ALT: 20 IU/L (ref 0–32)
AST: 20 IU/L (ref 0–40)
Albumin/Globulin Ratio: 1.7 (ref 1.2–2.2)
BILIRUBIN TOTAL: 0.4 mg/dL (ref 0.0–1.2)
BUN / CREAT RATIO: 19 (ref 12–28)
BUN: 20 mg/dL (ref 8–27)
CHLORIDE: 103 mmol/L (ref 96–106)
CO2: 27 mmol/L (ref 18–29)
Calcium: 9 mg/dL (ref 8.7–10.3)
Creatinine, Ser: 1.06 mg/dL — ABNORMAL HIGH (ref 0.57–1.00)
GFR calc Af Amer: 60 mL/min/{1.73_m2} (ref >59)
GFR calc non Af Amer: 52 mL/min/{1.73_m2} — ABNORMAL LOW (ref >59)
Globulin, Total: 2.3 g/dL (ref 1.5–4.5)
Glucose: 85 mg/dL (ref 65–99)
Potassium: 4.3 mmol/L (ref 3.5–5.2)
Sodium: 143 mmol/L (ref 134–144)
Total Protein: 6.2 g/dL (ref 6.0–8.5)

## 2016-07-10 LAB — LIPID PANEL
Chol/HDL Ratio: 3.7 ratio units (ref 0.0–4.4)
Cholesterol, Total: 184 mg/dL (ref 100–199)
HDL: 50 mg/dL (ref >39)
LDL Calculated: 115 mg/dL — ABNORMAL HIGH (ref 0–99)
Triglycerides: 94 mg/dL (ref 0–149)
VLDL Cholesterol Cal: 19 mg/dL (ref 5–40)

## 2016-07-10 LAB — VITAMIN D 25 HYDROXY (VIT D DEFICIENCY, FRACTURES): VIT D 25 HYDROXY: 28.9 ng/mL — AB (ref 30.0–100.0)

## 2016-07-10 LAB — HEMOGLOBIN A1C
Est. average glucose Bld gHb Est-mCnc: 128 mg/dL
Hgb A1c MFr Bld: 6.1 % — ABNORMAL HIGH (ref 4.8–5.6)

## 2016-07-10 LAB — HEPATITIS C ANTIBODY: Hep C Virus Ab: <0.1 s/co ratio (ref 0.0–0.9)

## 2016-07-13 ENCOUNTER — Telehealth: Payer: Self-pay

## 2016-07-13 DIAGNOSIS — D72829 Elevated white blood cell count, unspecified: Secondary | ICD-10-CM

## 2016-07-13 NOTE — Telephone Encounter (Signed)
Patient advised as below. Patient agrees to be referred to hematology.

## 2016-07-13 NOTE — Telephone Encounter (Signed)
Referral placed.

## 2016-07-13 NOTE — Telephone Encounter (Signed)
-----   Message from Mar Daring, [place] sent at 07/11/2016  9:39 AM EDT ----- Your WBC still remains elevated, but stable as it was 11 months ago. I could refer to hematology for further evaluation if you desire to rule out any other causes for the elevation if this has not been done previously. All other labs are fairly stable. Continue all current treatments. Vit D continues to be borderline low so continue high dose vit D supplement weekly.

## 2016-07-20 ENCOUNTER — Inpatient Hospital Stay: Payer: Medicare Other | Attending: Oncology | Admitting: Oncology

## 2016-07-20 ENCOUNTER — Encounter: Payer: Self-pay | Admitting: Oncology

## 2016-07-20 ENCOUNTER — Inpatient Hospital Stay: Payer: Medicare Other

## 2016-07-20 VITALS — BP 154/83 | HR 65 | Resp 19 | Ht 64.57 in | Wt 181.9 lb

## 2016-07-20 DIAGNOSIS — N3281 Overactive bladder: Secondary | ICD-10-CM | POA: Diagnosis not present

## 2016-07-20 DIAGNOSIS — E7800 Pure hypercholesterolemia, unspecified: Secondary | ICD-10-CM | POA: Diagnosis not present

## 2016-07-20 DIAGNOSIS — D72820 Lymphocytosis (symptomatic): Secondary | ICD-10-CM

## 2016-07-20 DIAGNOSIS — M1310 Monoarthritis, not elsewhere classified, unspecified site: Secondary | ICD-10-CM | POA: Insufficient documentation

## 2016-07-20 DIAGNOSIS — E559 Vitamin D deficiency, unspecified: Secondary | ICD-10-CM | POA: Diagnosis not present

## 2016-07-20 DIAGNOSIS — E039 Hypothyroidism, unspecified: Secondary | ICD-10-CM | POA: Insufficient documentation

## 2016-07-20 DIAGNOSIS — I10 Essential (primary) hypertension: Secondary | ICD-10-CM | POA: Diagnosis not present

## 2016-07-20 DIAGNOSIS — Z79899 Other long term (current) drug therapy: Secondary | ICD-10-CM | POA: Diagnosis not present

## 2016-07-20 DIAGNOSIS — D721 Eosinophilia: Secondary | ICD-10-CM | POA: Diagnosis present

## 2016-07-20 DIAGNOSIS — Z7982 Long term (current) use of aspirin: Secondary | ICD-10-CM | POA: Diagnosis not present

## 2016-07-20 LAB — COMPREHENSIVE METABOLIC PANEL
ALT: 18 U/L (ref 14–54)
AST: 20 U/L (ref 15–41)
Albumin: 3.9 g/dL (ref 3.5–5.0)
Alkaline Phosphatase: 97 U/L (ref 38–126)
Anion gap: 5 (ref 5–15)
BUN: 24 mg/dL — AB (ref 6–20)
CHLORIDE: 105 mmol/L (ref 101–111)
CO2: 25 mmol/L (ref 22–32)
CREATININE: 0.89 mg/dL (ref 0.44–1.00)
Calcium: 8.9 mg/dL (ref 8.9–10.3)
GFR calc Af Amer: >60 mL/min (ref >60)
GLUCOSE: 113 mg/dL — AB (ref 65–99)
Potassium: 3.8 mmol/L (ref 3.5–5.1)
Sodium: 135 mmol/L (ref 135–145)
Total Bilirubin: 0.4 mg/dL (ref 0.3–1.2)
Total Protein: 6.9 g/dL (ref 6.5–8.1)

## 2016-07-20 LAB — CBC WITH DIFFERENTIAL/PLATELET
Basophils Absolute: 0.1 10*3/uL (ref 0–0.1)
Basophils Relative: 1 %
EOS PCT: 9 %
Eosinophils Absolute: 1.1 10*3/uL — ABNORMAL HIGH (ref 0–0.7)
HCT: 41.6 % (ref 35.0–47.0)
Hemoglobin: 13.4 g/dL (ref 12.0–16.0)
LYMPHS ABS: 6.6 10*3/uL — AB (ref 1.0–3.6)
LYMPHS PCT: 51 %
MCH: 29 pg (ref 26.0–34.0)
MCHC: 32.2 g/dL (ref 32.0–36.0)
MCV: 90 fL (ref 80.0–100.0)
MONO ABS: 0.8 10*3/uL (ref 0.2–0.9)
MONOS PCT: 6 %
Neutro Abs: 4.2 10*3/uL (ref 1.4–6.5)
Neutrophils Relative %: 33 %
PLATELETS: 270 10*3/uL (ref 150–440)
RBC: 4.62 MIL/uL (ref 3.80–5.20)
RDW: 14.2 % (ref 11.5–14.5)
WBC: 12.9 10*3/uL — AB (ref 3.6–11.0)

## 2016-07-20 NOTE — Progress Notes (Signed)
Hematology/Oncology Consult note [facility] [phone] [phone]  Patient Care Team: Mar Daring, PA-C as PCP - General (Family Medicine) Monna Fam, MD as Consulting Physician (Ophthalmology) [name], MD as Consulting Physician (Neurology)   Name of the [name]  [id number]  1940/05/02    Reason for referral- leucocytosis   Referring physician- Fenton Malling   Date of visit: 07/20/16   History of presenting illness- patient is a 76 year old female with a past medical history significant for hypertension, vitamin D deficiency and hypothyroidism. She has been sent to [place] for evaluation of leukocytosis. Trend of her CBC over the last 1 year is as follows:  Component     Latest Ref Rng & Units 02/14/2014 08/16/2015 07/09/2016  WBC     3.4 - 10.8 x10E3/uL 10.2 12.4 (H) 12.1 (H)  RBC     3.77 - 5.28 x10E6/uL  4.21 4.50  Hemoglobin     11.1 - 15.9 g/dL 12.9 12.4 13.6  HCT     34.0 - 46.6 % 38 36.8 39.9  MCV     79 - 97 fL  87 89  MCH     26.6 - 33.0 pg  29.5 30.2  MCHC     31.5 - 35.7 g/dL  33.7 34.1  RDW     12.3 - 15.4 %  14.2 14.0  Platelets     150 - 379 x10E3/uL 228 259 262  Neutrophils     Not Estab. %  33 34  Lymphs     Not Estab. %  55 52  Monocytes     Not Estab. %  8 5  Eos     Not Estab. %  4 9  Basos     Not Estab. %  0 0  NEUT#     1.4 - 7.0 x10E3/uL  4.1 4.1  Lymphocyte #     0.7 - 3.1 x10E3/uL  6.7 (H) 6.2 (H)  Monocytes Absolute     0.1 - 0.9 x10E3/uL  1.0 (H) 0.6  EOS (ABSOLUTE)     0.0 - 0.4 x10E3/uL  0.5 (H) 1.0 (H)  Basophils Absolute     0.0 - 0.2 x10E3/uL  0.0 0.0  Immature Granulocytes     Not Estab. %  0 0  Immature Grans (Abs)     0.0 - 0.1 x10E3/uL  0.0 0.0    overall patient is doing well and denies any complaints of fevers, chills, unintentional weight loss, fatigue or night sweats. Denies any lumps or bumps anywhere.   ECOG PS- 0  Pain scale-  0   Review of systems- Review of Systems  Constitutional: Negative for chills, fever, malaise/fatigue and weight loss.  HENT: Negative for congestion, ear discharge and nosebleeds.   Eyes: Negative for blurred vision.  Respiratory: Negative for cough, hemoptysis, sputum production, shortness of breath and wheezing.   Cardiovascular: Negative for chest pain, palpitations, orthopnea and claudication.  Gastrointestinal: Negative for abdominal pain, blood in stool, constipation, diarrhea, heartburn, melena, nausea and vomiting.  Genitourinary: Negative for dysuria, flank pain, frequency, hematuria and urgency.  Musculoskeletal: Negative for back pain, joint pain and myalgias.  Skin: Negative for rash.  Neurological: Negative for dizziness, tingling, focal weakness, seizures, weakness and headaches.  Endo/Heme/Allergies: Does not bruise/bleed easily.  Psychiatric/Behavioral: Negative for depression and suicidal ideas. The patient does not have insomnia.     Allergies  Allergen Reactions  . Shellfish Allergy     Throat  closes, rash  . Latex Rash    Tapes,adhesives    Patient Active Problem List   Diagnosis Date Noted  . Cellulitis 08/22/2015  . 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 Overactive bladder 05/30/2015  . Mild cognitive impairment 02/07/2015  . Bloodgood disease 12/25/2014  . Colon polyp 12/25/2014  . 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 Cramps of lower extremity 12/25/2014  . Abnormal cerebrospinal fluid 12/25/2014  . Narrowing of intervertebral disc space 12/25/2014  . Contracture of palmar fascia (Dupuytren's) 12/25/2014  . Breath shortness 12/25/2014  . Essential (primary) hypertension 12/25/2014  . Benign essential tremor 12/25/2014  . Abnormal gait 12/25/2014  . Hypercholesteremia 12/25/2014  . Blood glucose elevated 12/25/2014  . Decreased potassium in the blood 12/25/2014  . Hypothyroidism 12/25/2014  . Insomnia 12/25/2014  . Headache, migraine,  intractable 12/25/2014  . 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[place]) 12/25/2014  . Fibrositis 12/25/2014  . Discoloration of nail 12/25/2014  . 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 Arthritis sicca 12/25/2014  . Burning or prickling sensation 12/25/2014  . Post menopausal syndrome 12/25/2014  . Avitaminosis D 12/25/2014  . Migraine 12/14/2012     Past Medical History:  Diagnosis Date  . Hyperglycemia   . Hyperlipidemia   . Hypertension   . Migraine, unspecified, without mention of intractable migraine without mention of status migrainosus 12/14/2012  . Osteoarthritis, chronic      Past Surgical History:  Procedure Laterality Date  . BREAST CYST ASPIRATION Left 1993   Removed  . History of Cataract surgery Right 2011  . PARTIAL HYSTERECTOMY    . S/P ACL repair Left    knee  . TUBAL LIGATION  1975   Bilateral    Social History   Social History  . Marital status: Divorced    Spouse name: N/A  . Number of children: 1  . Years of education: college   Occupational History  . book keeping Other    [employer]   Social History Main Topics  . Smoking status: Never Smoker  . Smokeless tobacco: Never Used  . Alcohol use 0.6 oz/week    1 Glasses of wine per week     Comment: Rare- wine on occasion  . Drug use: No  . Sexual activity: Not on file   Other Topics Concern  . Not on file   Social History Narrative   Patient is right handed, resides alone. Divorced.     Family History  Problem Relation Age of Onset  . Colon cancer Mother   . Hypertension Mother   . Colon cancer Father   . Heart disease Father   . Hypertension Father      Current Outpatient Prescriptions:  .  ALPRAZolam (XANAX) 0.25 MG tablet, take 2 tablets by mouth at bedtime, Disp: 60 tablet, Rfl: 5 .  aspirin 81 MG tablet, Take 81 mg by mouth daily., Disp: , Rfl:  .  atenolol (TENORMIN) 50 MG tablet, take 1 tablet by mouth once daily, Disp: 90 tablet, Rfl: 1 .  BAYER CONTOUR TEST test  strip, , Disp: , Rfl:  .  Cholecalciferol 50000 units capsule, 1 capsule once a month, Disp: 3 capsule, Rfl: 1 .  cyclobenzaprine (FLEXERIL) 10 MG tablet, take 1 tablet by mouth at bedtime, Disp: 30 tablet, Rfl: 11 .  donepezil (ARICEPT) 5 MG tablet, take 1 tablet by mouth at bedtime (Patient taking differently: take 1 tablet by mouth in the morning), Disp: 30 tablet, Rfl: 11 .  ezetimibe (ZETIA) 10 MG tablet,  take 1 tablet by mouth at bedtime, Disp: 30 tablet, Rfl: 5 .  gabapentin (NEURONTIN) 100 MG capsule, take 2 capsules by mouth every morning and 2 AT LUNCH, Disp: 360 capsule, Rfl: 1 .  gabapentin (NEURONTIN) 300 MG capsule, take 1 capsule by mouth at bedtime, Disp: 90 capsule, Rfl: 3 .  Hydrocodone-Acetaminophen 5-300 MG TABS, Take 1 tablet by mouth 2 (two) times daily as needed., Disp: 60 each, Rfl: 0 .  levothyroxine (SYNTHROID, LEVOTHROID) 88 MCG tablet, take 1 tablet by mouth once daily, Disp: 90 tablet, Rfl: 1 .  losartan (COZAAR) 50 MG tablet, take 1 tablet by mouth at bedtime, Disp: 90 tablet, Rfl: 1 .  potassium chloride SA (K-DUR,KLOR-CON) 20 MEQ tablet, take 1 tablet by mouth twice a day, Disp: 180 tablet, Rfl: 1 .  topiramate (TOPAMAX) 100 MG tablet, Take 0.5 tablets (50 mg total) by mouth every evening. (Patient taking differently: Take 25 mg by mouth every evening. ), Disp: 30 tablet, Rfl: 12 .  triamterene-hydrochlorothiazide (MAXZIDE) 75-50 MG tablet, TAKE 1/2 TABLET BY MOUTH ONCE DAILY, Disp: 90 tablet, Rfl: 1 .  verapamil (VERELAN PM) 120 MG 24 hr capsule, take 1 capsule by mouth once daily, Disp: 90 capsule, Rfl: 3 .  VESICARE 10 MG tablet, take 1 tablet by mouth once daily, Disp: 90 tablet, Rfl: 4 .  zolpidem (AMBIEN) 10 MG tablet, take 1 tablet by mouth at bedtime, Disp: 90 tablet, Rfl: 1   Physical exam:  Vitals:   07/20/16 1359  BP: (!) 154/83  Pulse: 65  Resp: 19  TempSrc: Tympanic  Weight: 181 lb 14.1 oz (82.5 kg)  Height: 5' 4.57" (1.64 m)   Physical Exam    Constitutional: She is oriented to person, place, and time and well-developed, well-nourished, and in no distress.  HENT:  Head: Normocephalic and atraumatic.  Eyes: EOM are normal. Pupils are equal, round, and reactive to light.  Neck: Normal range of motion.  Cardiovascular: Normal rate, regular rhythm and normal heart sounds.   Pulmonary/Chest: Effort normal and breath sounds normal.  Abdominal: Soft. Bowel sounds are normal.  Lymphadenopathy:  No palpable cervical, axillary, supraclavicular or inguinal adenopathy  Neurological: She is alert and oriented to person, place, and time.  Skin: Skin is warm and dry.       CMP Latest Ref Rng & Units 07/09/2016  Glucose 65 - 99 mg/dL 85  BUN 8 - 27 mg/dL 20  Creatinine 0.57 - 1.00 mg/dL 1.06(H)  Sodium 134 - 144 mmol/L 143  Potassium 3.5 - 5.2 mmol/L 4.3  Chloride 96 - 106 mmol/L 103  CO2 18 - 29 mmol/L 27  Calcium 8.7 - 10.3 mg/dL 9.0  Total Protein 6.0 - 8.5 g/dL 6.2  Total Bilirubin 0.0 - 1.2 mg/dL 0.4  Alkaline Phos 39 - 117 IU/L 119(H)  AST 0 - 40 IU/L 20  ALT 0 - 32 IU/L 20   CBC Latest Ref Rng & Units 07/09/2016  WBC 3.4 - 10.8 x10E3/uL 12.1(H)  Hemoglobin 12.0 - 16.0 g/dL -  Hematocrit 34.0 - 46.6 % 39.9  Platelets 150 - 379 x10E3/uL 262    No images are attached to the encounter.  No results found.  Assessment and plan- Patient is a 76 y.o. female who has been referred to [place] for leukocytosis mainly lymphocytosis   I explained to the patient and her lymphocytosis can be reactive or clonal. Today we will obtain a CBC, CMP along with pathology review of her smear. We will obtain  peripheral flow cytometry to rule out CLL as well as BCR able testing to rule out CML. I will see the patient back in 2 weeks' time to discuss the results of her blood work. If these tests are negative I am inclined to monitor this conservatively without a bone marrow biopsy at this time. Also patient has isolated leukocytosis in the absence of  anemia or thrombocytopenia   Thank you for this kind referral and the opportunity to participate in the care of this patient   Visit Diagnosis 1. Lymphocytosis     Dr. Randa Evens, MD, MPH [place] [facility] Pager- [phone] 07/20/2016

## 2016-07-20 NOTE — Progress Notes (Signed)
New referral for leucocytosis. Patient had lab work 1 year ago WBC mildly elevated; recently WBC remains mildly elevated at 12.1. Denies any fevers, night sweats or obvious swollen lymph nodes. No weight loss. Appetite is good. Bowels normal.

## 2016-07-21 LAB — PATHOLOGIST SMEAR REVIEW

## 2016-07-24 ENCOUNTER — Other Ambulatory Visit: Payer: Self-pay | Admitting: Physician Assistant

## 2016-07-24 LAB — COMP PANEL: LEUKEMIA/LYMPHOMA: Immunophenotypic Profile: 36

## 2016-07-25 ENCOUNTER — Other Ambulatory Visit: Payer: Self-pay | Admitting: Physician Assistant

## 2016-07-25 DIAGNOSIS — G4700 Insomnia, unspecified: Secondary | ICD-10-CM

## 2016-07-25 LAB — BCR-ABL1, CML/ALL, PCR, QUANT

## 2016-07-27 ENCOUNTER — Other Ambulatory Visit: Payer: Self-pay | Admitting: Physician Assistant

## 2016-07-27 DIAGNOSIS — G4700 Insomnia, unspecified: Secondary | ICD-10-CM

## 2016-08-03 ENCOUNTER — Encounter: Payer: Self-pay | Admitting: Oncology

## 2016-08-03 ENCOUNTER — Ambulatory Visit
Admission: RE | Admit: 2016-08-03 | Discharge: 2016-08-03 | Disposition: A | Discharge status: Home / Self Care | Payer: Medicare Other | Source: Ambulatory Visit | Attending: Oncology | Admitting: Oncology

## 2016-08-03 ENCOUNTER — Inpatient Hospital Stay (HOSPITAL_BASED_OUTPATIENT_CLINIC_OR_DEPARTMENT_OTHER): Payer: Medicare Other | Admitting: Oncology

## 2016-08-03 VITALS — BP 136/70 | HR 64 | Temp 97.1°F | Ht 63.0 in | Wt 180.0 lb

## 2016-08-03 DIAGNOSIS — D721 Eosinophilia, unspecified: Secondary | ICD-10-CM

## 2016-08-03 DIAGNOSIS — C9110 Chronic lymphocytic leukemia of B-cell type not having achieved remission: Secondary | ICD-10-CM

## 2016-08-03 NOTE — Progress Notes (Signed)
Patient here for follow up no changes since last appointment 

## 2016-08-03 NOTE — Progress Notes (Signed)
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 She has been sent to [place] for evaluation of leukocytosis. Over the last 1 year patient's white count has been around 12 with predominantly lymphocytosis and monocytosis as well as eosinophilia. No evidence of anemia or thrombocytopenia. Overall patient is doing well and denies any complaints of fevers, chills, unintentional weight loss, fatigue or night sweats. Denies any lumps or bumps anywhere  Further blood work from 07-2016 was as follows: CBC showed white count of 12.9 with an absolute lymphocyte count of 6.6 and an eosinophilia of 1.1. H&H was 13.4/41.6 and a platelet count of 270. CMP was unremarkable. Review of peripheral smear showed mild leukocytosis with absolute lymphocytosis and eosinophilia. BCR able testing was negative for CML. Flow cytometry showed CD5 positive, CD23 positive clonal B-cell population CLL/SLL phenotype CD38 negative. 36% of leukocytes are less than 5000/mcL. eosinophilia  Interval history- reports some nausea over last couple of weeks. Denies other complaints    Review of systems- Review of Systems  Constitutional: Negative for chills, fever, malaise/fatigue and weight loss.  HENT: Negative for congestion, ear discharge and nosebleeds.   Eyes: Negative for blurred vision.  Respiratory:  Negative for cough, hemoptysis, sputum production, shortness of breath and wheezing.   Cardiovascular: Negative for chest pain, palpitations, orthopnea and claudication.  Gastrointestinal: Negative for abdominal pain, blood in stool, constipation, diarrhea, heartburn, melena, nausea and vomiting.  Genitourinary: Negative for dysuria, flank pain, frequency, hematuria and urgency.  Musculoskeletal: Negative for back pain, joint pain and myalgias.  Skin: Negative for rash.  Neurological: Negative for dizziness, tingling, focal weakness, seizures, weakness and headaches.  Endo/Heme/Allergies: Does not bruise/bleed easily.  Psychiatric/Behavioral: Negative for depression and suicidal ideas. The patient does not have insomnia.      Current treatment- observation  Allergies  Allergen Reactions  . Shellfish Allergy     Throat closes, rash  . Latex Rash    Tapes,adhesives     Past Medical History:  Diagnosis Date  . Hyperglycemia   . Hyperlipidemia   . Hypertension   . Migraine, unspecified, without mention of intractable migraine without mention of status migrainosus 12/14/2012  . Osteoarthritis, chronic      Past Surgical History:  Procedure Laterality Date  . BREAST CYST ASPIRATION Left 1993   Removed  . History of Cataract surgery Right 2011  . PARTIAL HYSTERECTOMY    . S/P ACL repair Left    knee  . TUBAL LIGATION  1975   Bilateral    Social History   Social History  . Marital status: Divorced    Spouse name: N/A  . Number of children: 1  . Years of education: college   Occupational History  . book keeping Other    [employer]   Social History Main Topics  . Smoking status: Never Smoker  . Smokeless tobacco: Never Used  . Alcohol use 0.6 oz/week    1 Glasses of wine  per week     Comment: Rare- wine on occasion  . Drug use: No  . Sexual activity: Not on file   Other Topics Concern  . Not on file   Social History Narrative   Patient is right  handed, resides alone. Divorced.    Family History  Problem Relation Age of Onset  . Colon cancer Mother   . Hypertension Mother   . Colon cancer Father   . Heart disease Father   . Hypertension Father      Current Outpatient Prescriptions:  .  ALPRAZolam (XANAX) 0.25 MG tablet, take 2 tablets by mouth at bedtime, Disp: 60 tablet, Rfl: 5 .  aspirin 81 MG tablet, Take 81 mg by mouth daily., Disp: , Rfl:  .  atenolol (TENORMIN) 50 MG tablet, take 1 tablet by mouth once daily, Disp: 90 tablet, Rfl: 1 .  BAYER CONTOUR TEST test strip, , Disp: , Rfl:  .  Cholecalciferol 50000 units capsule, 1 capsule once a month, Disp: 3 capsule, Rfl: 1 .  cyclobenzaprine (FLEXERIL) 10 MG tablet, take 1 tablet by mouth at bedtime, Disp: 30 tablet, Rfl: 11 .  donepezil (ARICEPT) 5 MG tablet, take 1 tablet by mouth at bedtime (Patient taking differently: take 1 tablet by mouth in the morning), Disp: 30 tablet, Rfl: 11 .  ezetimibe (ZETIA) 10 MG tablet, take 1 tablet by mouth at bedtime, Disp: 30 tablet, Rfl: 5 .  gabapentin (NEURONTIN) 100 MG capsule, take 2 capsules by mouth every morning and 2 AT LUNCH, Disp: 360 capsule, Rfl: 1 .  gabapentin (NEURONTIN) 300 MG capsule, take 1 capsule by mouth at bedtime, Disp: 90 capsule, Rfl: 3 .  Hydrocodone-Acetaminophen 5-300 MG TABS, Take 1 tablet by mouth 2 (two) times daily as needed., Disp: 60 each, Rfl: 0 .  levothyroxine (SYNTHROID, LEVOTHROID) 88 MCG tablet, take 1 tablet by mouth once daily, Disp: 90 tablet, Rfl: 1 .  losartan (COZAAR) 50 MG tablet, take 1 tablet by mouth at bedtime, Disp: 90 tablet, Rfl: 1 .  potassium chloride SA (K-DUR,KLOR-CON) 20 MEQ tablet, take 1 tablet by mouth twice a day, Disp: 180 tablet, Rfl: 1 .  topiramate (TOPAMAX) 100 MG tablet, Take 0.5 tablets (50 mg total) by mouth every evening. (Patient taking differently: Take 25 mg by mouth every evening. ), Disp: 30 tablet, Rfl: 12 .  triamterene-hydrochlorothiazide (MAXZIDE) 75-50 MG  tablet, TAKE 1/2 TABLET BY MOUTH ONCE DAILY, Disp: 90 tablet, Rfl: 1 .  verapamil (VERELAN PM) 120 MG 24 hr capsule, take 1 capsule by mouth once daily, Disp: 90 capsule, Rfl: 3 .  VESICARE 10 MG tablet, take 1 tablet by mouth once daily, Disp: 90 tablet, Rfl: 4 .  zolpidem (AMBIEN) 10 MG tablet, take 1 tablet by mouth at bedtime, Disp: 90 tablet, Rfl: 1  Physical exam:  Vitals:   08/03/16 1357  BP: 136/70  Pulse: 64  Temp: 97.1 F (36.2 C)  TempSrc: Tympanic  Weight: 180 lb 0.1 oz (81.7 kg)  Height: 5\' 3"  (1.6 m)   Physical Exam  Constitutional: She is oriented to person, place, and time and well-developed, well-nourished, and in no distress.  HENT:  Head: Normocephalic and atraumatic.  Eyes: EOM are normal. Pupils are equal, round, and reactive to light.  Neck: Normal range of motion.  Cardiovascular: Normal rate, regular rhythm and normal heart sounds.   Pulmonary/Chest: Effort normal and breath sounds normal.  Abdominal: Soft. Bowel sounds are normal.  Lymphadenopathy:  No palpable adenopathy  Neurological: She is alert and oriented to person, place, and time.  Skin: Skin is warm and dry.     CMP Latest Ref Rng & Units 07/20/2016  Glucose 65 - 99 mg/dL 113(H)  BUN 6 - 20 mg/dL 24(H)  Creatinine 0.44 - 1.00 mg/dL 0.89  Sodium 135 - 145 mmol/L 135  Potassium 3.5 - 5.1 mmol/L 3.8  Chloride 101 - 111 mmol/L 105  CO2 22 - 32 mmol/L 25  Calcium 8.9 - 10.3 mg/dL 8.9  Total Protein 6.5 - 8.1 g/dL 6.9  Total Bilirubin 0.3 - 1.2 mg/dL 0.4  Alkaline Phos 38 - 126 U/L 97  AST 15 - 41 U/L 20  ALT 14 - 54 U/L 18   CBC Latest Ref Rng & Units 07/20/2016  WBC 3.6 - 11.0 K/uL 12.9(H)  Hemoglobin 12.0 - 16.0 g/dL 13.4  Hematocrit 35.0 - 47.0 % 41.6  Platelets 150 - 440 K/uL 270      Assessment and plan- Patient is a 76 y.o. female referred to [place] efor evaluation of leucocytosis  Leucocytosis is predominantly lymphocytosis and eosinophilia. There was evidence of clonal  lymphocytosis noted suggestive of monoclonal B ecll lymphocytosis/ CLL rai stage 0. No evidence of B symptoms or cytopenias that would warrant treatmen of CLL at this time. No palpable adenopathy.   She also has mild eosinophilia given that absolute eosinophil count <1500.no travel outside travel and no signs of organ involvement. I will obtain CXR. Continue to monitor. No need for BM biopsy at this time.   I will see her back in 4 months with cbc and diff     Visit Diagnosis 1. Eosinophilia   2. CLL (chronic lymphocytic leukemia) ([place])      Dr. Randa Evens, MD, MPH [facility] Pager- [phone] 08/03/2016 1:26 PM

## 2016-09-02 ENCOUNTER — Encounter: Payer: Self-pay | Admitting: Gastroenterology

## 2016-09-08 ENCOUNTER — Other Ambulatory Visit: Payer: Self-pay | Admitting: Family Medicine

## 2016-09-08 DIAGNOSIS — I10 Essential (primary) hypertension: Secondary | ICD-10-CM

## 2016-09-08 MED ORDER — TRIAMTERENE-HCTZ 75-50 MG PO TABS: 0.5000 | ORAL_TABLET | Freq: Every day | ORAL | 3 refills | Status: DC

## 2016-09-08 NOTE — Telephone Encounter (Signed)
[facility] faxed a request on the following medication.  triamterene-hydrochlorothiazide (MAXZIDE) 75-50 MG tablet  Take 1/2 tablet by mouth once daily.

## 2016-10-22 ENCOUNTER — Ambulatory Visit (AMBULATORY_SURGERY_CENTER): Payer: Self-pay | Admitting: *Deleted

## 2016-10-22 VITALS — Ht 65.0 in | Wt 180.4 lb

## 2016-10-22 DIAGNOSIS — Z86010 Personal history of colonic polyps: Secondary | ICD-10-CM

## 2016-10-22 DIAGNOSIS — Z800 Family history of malignant neoplasm of digestive organs: Secondary | ICD-10-CM

## 2016-10-22 MED ORDER — NA SULFATE-K SULFATE-MG SULF 17.5-3.13-1.6 GM/177ML PO SOLN: 1.0000 | Freq: Once | ORAL | 0 refills | Status: AC

## 2016-10-22 NOTE — Progress Notes (Signed)
No egg or soy allergy known to patient  No issues with past sedation with any surgeries  or procedures, no intubation problems  No diet pills per patient No home 02 use per patient  No blood thinners per patient  Pt denies issues with constipation  No A fib or A flutter  EMMI video sent to pt's e mail  

## 2016-10-26 ENCOUNTER — Encounter: Payer: Self-pay | Admitting: Gastroenterology

## 2016-11-02 ENCOUNTER — Other Ambulatory Visit: Payer: Self-pay | Admitting: Physician Assistant

## 2016-11-02 ENCOUNTER — Encounter: Payer: Medicare Other | Admitting: Gastroenterology

## 2016-11-02 DIAGNOSIS — E559 Vitamin D deficiency, unspecified: Secondary | ICD-10-CM

## 2016-11-04 ENCOUNTER — Other Ambulatory Visit: Payer: Self-pay | Admitting: Neurology

## 2016-11-05 ENCOUNTER — Ambulatory Visit (AMBULATORY_SURGERY_CENTER): Payer: Medicare Other | Admitting: Gastroenterology

## 2016-11-05 ENCOUNTER — Encounter: Payer: Self-pay | Admitting: Gastroenterology

## 2016-11-05 VITALS — BP 140/60 | HR 58 | Temp 96.2°F | Resp 18 | Ht 65.0 in | Wt 180.0 lb

## 2016-11-05 DIAGNOSIS — Z86010 Personal history of colonic polyps: Secondary | ICD-10-CM | POA: Diagnosis not present

## 2016-11-05 DIAGNOSIS — Z1211 Encounter for screening for malignant neoplasm of colon: Secondary | ICD-10-CM | POA: Diagnosis not present

## 2016-11-05 DIAGNOSIS — Z800 Family history of malignant neoplasm of digestive organs: Secondary | ICD-10-CM | POA: Diagnosis not present

## 2016-11-05 MED ORDER — SODIUM CHLORIDE 0.9 % IV SOLN: 500.0000 mL | INTRAVENOUS | Status: DC

## 2016-11-05 NOTE — Patient Instructions (Signed)
YOU HAD AN ENDOSCOPIC PROCEDURE TODAY AT [facility]:   Refer to the procedure report that was given to you for any specific questions about what was found during the examination.  If the procedure report does not answer your questions, please call your gastroenterologist to clarify.  If you requested that your care partner not be given the details of your procedure findings, then the procedure report has been included in a sealed envelope for you to review at your convenience later.  YOU SHOULD EXPECT: Some feelings of bloating in the abdomen. Passage of more gas than usual.  Walking can help get rid of the air that was put into your GI tract during the procedure and reduce the bloating. If you had a lower endoscopy (such as a colonoscopy or flexible sigmoidoscopy) you may notice spotting of blood in your stool or on the toilet paper. If you underwent a bowel prep for your procedure, you may not have a normal bowel movement for a few days.  Please Note:  You might notice some irritation and congestion in your nose or some drainage.  This is from the oxygen used during your procedure.  There is no need for concern and it should clear up in a day or so.  SYMPTOMS TO REPORT IMMEDIATELY:   Following lower endoscopy (colonoscopy or flexible sigmoidoscopy):  Excessive amounts of blood in the stool  Significant tenderness or worsening of abdominal pains  Swelling of the abdomen that is new, acute  Fever of 100F or higher  For urgent or emergent issues, a gastroenterologist can be reached at any hour by calling [phone].   DIET:  We do recommend a small meal at first, but then you may proceed to your regular diet.  Drink plenty of fluids but you should avoid alcoholic beverages for 24 hours.  ACTIVITY:  You should plan to take it easy for the rest of today and you should NOT DRIVE or use heavy machinery until tomorrow (because of the sedation medicines used during the test).     FOLLOW UP: Our staff will call the number listed on your records the next business day following your procedure to check on you and address any questions or concerns that you may have regarding the information given to you following your procedure. If we do not reach you, we will leave a message.  However, if you are feeling well and you are not experiencing any problems, there is no need to return our call.  We will assume that you have returned to your regular daily activities without incident.  SIGNATURES/CONFIDENTIALITY: You and/or your care partner have signed paperwork which will be entered into your electronic medical record.  These signatures attest to the fact that that the information above on your After Visit Summary has been reviewed and is understood.  Full responsibility of the confidentiality of this discharge information lies with you and/or your care-partner.  Next colonoscopy- 5 years  Please read over handouts about diverticulosis and high fiber diets  Please continue your normal medications

## 2016-11-05 NOTE — Progress Notes (Signed)
Pt's states no medical or surgical changes since previsit or office visit. 

## 2016-11-05 NOTE — Op Note (Signed)
[place] Patient Name: Cathy Barnes Procedure Date: 11/05/2016 11:51 AM MRN: [id number] Endoscopist: Ladene Artist , MD Age: 76 Referring MD:  Date of Birth: 09/14/1940 Gender: Female Account #: [id number] Procedure:                Colonoscopy Indications:              Surveillance: Personal history of adenomatous                            polyps on last colonoscopy > 5 years ago. Family                            history of colon cancer in two 1st degree relatives. Medicines:                Monitored Anesthesia Care Procedure:                Pre-Anesthesia Assessment:                           - Prior to the procedure, a History and Physical                            was performed, and patient medications and                            allergies were reviewed. The patient's tolerance of                            previous anesthesia was also reviewed. The risks                            and benefits of the procedure and the sedation                            options and risks were discussed with the patient.                            All questions were answered, and informed consent                            was obtained. Prior Anticoagulants: The patient has                            taken no previous anticoagulant or antiplatelet                            agents. ASA Grade Assessment: II - A patient with                            mild systemic disease. After reviewing the risks                            and benefits, the patient was deemed in  satisfactory condition to undergo the procedure.                           After obtaining informed consent, the colonoscope                            was passed under direct vision. Throughout the                            procedure, the patient's blood pressure, pulse, and                            oxygen saturations were monitored continuously. The   Colonoscope was introduced through the anus and                            advanced to the the cecum, identified by                            appendiceal orifice and ileocecal valve. The                            ileocecal valve, appendiceal orifice, and rectum                            were photographed. The quality of the bowel                            preparation was good after extensive lavage and                            suctioning. The colonoscopy was performed without                            difficulty. The patient tolerated the procedure                            well. Scope In: 12:06:36 PM Scope Out: 12:21:18 PM Scope Withdrawal Time: 0 hours 11 minutes 43 seconds  Total Procedure Duration: 0 hours 14 minutes 42 seconds  Findings:                 The perianal and digital rectal examinations were                            normal.                           Multiple medium-mouthed diverticula were found in                            the left colon. There was no evidence of                            diverticular bleeding.  The exam was otherwise without abnormality on                            direct and retroflexion views. Complications:            No immediate complications. Estimated blood loss:                            None. Estimated Blood Loss:     Estimated blood loss: none. Impression:               - Mild diverticulosis in the left colon. There was                            no evidence of diverticular bleeding.                           - The examination was otherwise normal on direct                            and retroflexion views.                           - No specimens collected. Recommendation:           - Repeat colonoscopy in 5 years for surveillance.                           - Patient has a contact number available for                            emergencies. The signs and symptoms of potential                             delayed complications were discussed with the                            patient. Return to normal activities tomorrow.                            Written discharge instructions were provided to the                            patient.                           - High fiber diet.                           - Continue present medications. Ladene Artist, MD 11/05/2016 12:25:06 PM This report has been signed electronically.

## 2016-11-06 ENCOUNTER — Telehealth: Payer: Self-pay

## 2016-11-06 NOTE — Telephone Encounter (Signed)
  Follow up Call-  Call back number 11/05/2016  Post procedure Call Back phone  # [phone]  Permission to leave phone message Yes  Some recent data might be hidden     Patient questions:  Do you have a fever, pain , or abdominal swelling? No. Pain Score  0 *  Have you tolerated food without any problems? Yes.    Have you been able to return to your normal activities? Yes.    Do you have any questions about your discharge instructions: Diet   No. Medications  No. Follow up visit  No.  Do you have questions or concerns about your Care? No.  Actions: * If pain score is 4 or above: No action needed, pain <4.  No problems noted per pt. maw

## 2016-11-10 ENCOUNTER — Telehealth: Payer: Self-pay | Admitting: Physician Assistant

## 2016-11-10 DIAGNOSIS — M9979 Connective tissue and disc stenosis of intervertebral foramina of abdomen and other regions: Secondary | ICD-10-CM

## 2016-11-10 MED ORDER — HYDROCODONE-ACETAMINOPHEN 5-300 MG PO TABS: 1.0000 | ORAL_TABLET | Freq: Two times a day (BID) | ORAL | 0 refills | Status: DC | PRN

## 2016-11-10 NOTE — Telephone Encounter (Signed)
Rx printed. NCCSR reviewed and no red flags noted. Patient needs to make sure to not take at same time as alprazolam or zolpidem.

## 2016-11-10 NOTE — Telephone Encounter (Signed)
Pt needs refill on her hydrocodone 5/300mg   Pt's call back is [phone]  Thanks teri   Pt also wanted to let you know she had her colonoscopy and it was fine.

## 2016-11-10 NOTE — Telephone Encounter (Signed)
Patient advised. She states she never takes medication with Alprazolam or Zolpidem

## 2016-11-19 ENCOUNTER — Other Ambulatory Visit: Payer: Self-pay | Admitting: Physician Assistant

## 2016-11-19 DIAGNOSIS — I10 Essential (primary) hypertension: Secondary | ICD-10-CM

## 2016-11-23 ENCOUNTER — Encounter: Payer: Self-pay | Admitting: Oncology

## 2016-11-23 ENCOUNTER — Inpatient Hospital Stay: Payer: Medicare Other | Attending: Oncology | Admitting: Oncology

## 2016-11-23 ENCOUNTER — Inpatient Hospital Stay: Payer: Medicare Other

## 2016-11-23 VITALS — BP 114/77 | HR 58 | Temp 96.2°F | Resp 18 | Ht 65.0 in | Wt 179.0 lb

## 2016-11-23 DIAGNOSIS — M1990 Unspecified osteoarthritis, unspecified site: Secondary | ICD-10-CM | POA: Insufficient documentation

## 2016-11-23 DIAGNOSIS — I10 Essential (primary) hypertension: Secondary | ICD-10-CM | POA: Insufficient documentation

## 2016-11-23 DIAGNOSIS — D721 Eosinophilia, unspecified: Secondary | ICD-10-CM

## 2016-11-23 DIAGNOSIS — K219 Gastro-esophageal reflux disease without esophagitis: Secondary | ICD-10-CM | POA: Insufficient documentation

## 2016-11-23 DIAGNOSIS — E559 Vitamin D deficiency, unspecified: Secondary | ICD-10-CM | POA: Diagnosis not present

## 2016-11-23 DIAGNOSIS — E785 Hyperlipidemia, unspecified: Secondary | ICD-10-CM | POA: Diagnosis not present

## 2016-11-23 DIAGNOSIS — Z7982 Long term (current) use of aspirin: Secondary | ICD-10-CM | POA: Diagnosis not present

## 2016-11-23 DIAGNOSIS — G919 Hydrocephalus, unspecified: Secondary | ICD-10-CM | POA: Insufficient documentation

## 2016-11-23 DIAGNOSIS — Z8711 Personal history of peptic ulcer disease: Secondary | ICD-10-CM | POA: Insufficient documentation

## 2016-11-23 DIAGNOSIS — E039 Hypothyroidism, unspecified: Secondary | ICD-10-CM | POA: Diagnosis not present

## 2016-11-23 DIAGNOSIS — Z79899 Other long term (current) drug therapy: Secondary | ICD-10-CM | POA: Diagnosis not present

## 2016-11-23 DIAGNOSIS — C9110 Chronic lymphocytic leukemia of B-cell type not having achieved remission: Secondary | ICD-10-CM | POA: Insufficient documentation

## 2016-11-23 LAB — CBC WITH DIFFERENTIAL/PLATELET
BASOS ABS: 0.1 10*3/uL (ref 0–0.1)
Basophils Relative: 1 %
Eosinophils Absolute: 1.2 10*3/uL — ABNORMAL HIGH (ref 0–0.7)
Eosinophils Relative: 9 %
HEMATOCRIT: 39.9 % (ref 35.0–47.0)
Hemoglobin: 13.2 g/dL (ref 12.0–16.0)
LYMPHS PCT: 53 %
Lymphs Abs: 6.8 10*3/uL — ABNORMAL HIGH (ref 1.0–3.6)
MCH: 29.4 pg (ref 26.0–34.0)
MCHC: 33.2 g/dL (ref 32.0–36.0)
MCV: 88.6 fL (ref 80.0–100.0)
MONO ABS: 0.6 10*3/uL (ref 0.2–0.9)
MONOS PCT: 5 %
NEUTROS ABS: 4 10*3/uL (ref 1.4–6.5)
Neutrophils Relative %: 32 %
Platelets: 253 10*3/uL (ref 150–440)
RBC: 4.5 MIL/uL (ref 3.80–5.20)
RDW: 14.5 % (ref 11.5–14.5)
WBC: 12.6 10*3/uL — ABNORMAL HIGH (ref 3.6–11.0)

## 2016-11-23 NOTE — Progress Notes (Signed)
Pt had a colonoscopy recently-no polyps, had diverticulosis only.

## 2016-11-23 NOTE — Progress Notes (Signed)
Hematology/Oncology Consult note [facility]  Telephone:([phone] [phone]  Patient Care Team: Rubye Beach as PCP - General (Family Medicine) Monna Fam, MD as Consulting Physician (Ophthalmology) [name], MD as Consulting Physician (Neurology)   Name of the [name]  [id number]  1940-10-01   Date of visit: 11/23/16  Diagnosis- 1. 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 Mild eosinophilia  Chief complaint/ Reason for visit- routine f/u  Heme/Onc history: patient is a 76 year old female with a past medical history significant for hypertension, vitamin D deficiency and hypothyroidism. She has been sent to [place] for evaluation of leukocytosis. Over the last 1 year patient's white count has been around 12 with predominantly lymphocytosis and monocytosis as well as eosinophilia. No evidence of anemia or thrombocytopenia. Overall patient is doing well and denies any complaints of fevers, chills, unintentional weight loss, fatigue or night sweats. Denies any lumps or bumps anywhere  Further blood work from 07-2016 was as follows: CBC showed white count of 12.9 with an absolute lymphocyte count of 6.6 and an eosinophilia of 1.1. H&H was 13.4/41.6 and a platelet count of 270. CMP was unremarkable. Review of peripheral smear showed mild leukocytosis with absolute lymphocytosis and eosinophilia. BCR able testing was negative for CML. Flow cytometry showed CD5 positive, CD23 positive clonal B-cell population CLL/SLL phenotype CD38 negative. 36% of leukocytes are less than 5000/mcL. eosinophilia  Interval history- doing well. Recent had colonoscopy which was normal  ECOG PS- 0 Pain scale- 0   Review of systems- Review of Systems  Constitutional: Negative for chills, fever, malaise/fatigue and weight loss.  HENT: Negative for congestion, ear discharge and nosebleeds.   Eyes: Negative for blurred vision.    Respiratory: Negative for cough, hemoptysis, sputum production, shortness of breath and wheezing.   Cardiovascular: Negative for chest pain, palpitations, orthopnea and claudication.  Gastrointestinal: Negative for abdominal pain, blood in stool, constipation, diarrhea, heartburn, melena, nausea and vomiting.  Genitourinary: Negative for dysuria, flank pain, frequency, hematuria and urgency.  Musculoskeletal: Negative for back pain, joint pain and myalgias.  Skin: Negative for rash.  Neurological: Negative for dizziness, tingling, focal weakness, seizures, weakness and headaches.  Endo/Heme/Allergies: Does not bruise/bleed easily.  Psychiatric/Behavioral: Negative for depression and suicidal ideas. The patient does not have insomnia.        Allergies  Allergen Reactions  . Shellfish Allergy     Throat closes, rash  . Latex Rash    Tapes,adhesives     Past Medical History:  Diagnosis Date  . Allergy    some  . Cataract    bilaterally both eyes   . GERD (gastroesophageal reflux disease)   . Hydrocephalus    [facility]   . Hyperglycemia   . Hyperlipidemia   . Hypertension   . Migraine, unspecified, without mention of intractable migraine without mention of status migrainosus 12/14/2012  . Osteoarthritis, chronic   . Osteoporosis   . Peptic ulcer    some bleeding with ulcers as well      Past Surgical History:  Procedure Laterality Date  . BREAST CYST ASPIRATION Left 1993   Removed  . breast cyst removed Right    benign   . COLONOSCOPY    . History of Cataract surgery Right 2011   left 2011  . PARTIAL HYSTERECTOMY    . POLYPECTOMY    . S/P ACL repair Left    knee  . [place]  Bilateral    Social History   Social History  . Marital status: Divorced    Spouse name: N/A  . Number of children: 1  . Years of education: college   Occupational History  . book keeping Other    [employer]   Social History Main Topics  . Smoking  status: Never Smoker  . Smokeless tobacco: Never Used  . Alcohol use 0.6 oz/week    1 Glasses of wine per week     Comment: Rare- wine on occasion  . Drug use: No  . Sexual activity: Not on file   Other Topics Concern  . Not on file   Social History Narrative   Patient is right handed, resides alone. Divorced.    Family History  Problem Relation Age of Onset  . Colon cancer Mother   . Hypertension Mother   . Colon cancer Father   . Heart disease Father   . Hypertension Father   . Colon polyps Neg Hx   . Rectal cancer Neg Hx   . Stomach cancer Neg Hx      Current Outpatient Prescriptions:  .  ALPRAZolam (XANAX) 0.25 MG tablet, take 2 tablets by mouth at bedtime, Disp: 60 tablet, Rfl: 5 .  aspirin 81 MG tablet, Take 81 mg by mouth daily., Disp: , Rfl:  .  atenolol (TENORMIN) 50 MG tablet, take 1 tablet by mouth once daily, Disp: 90 tablet, Rfl: 1 .  BAYER CONTOUR TEST test strip, , Disp: , Rfl:  .  Cholecalciferol 50000 units capsule, Take 1 capsule PO q 4 weeks, Disp: 3 capsule, Rfl: 1 .  cyclobenzaprine (FLEXERIL) 10 MG tablet, take 1 tablet by mouth at bedtime, Disp: 30 tablet, Rfl: 11 .  donepezil (ARICEPT) 5 MG tablet, take 1 tablet by mouth at bedtime (Patient taking differently: take 1 tablet by mouth in the morning), Disp: 30 tablet, Rfl: 11 .  ezetimibe (ZETIA) 10 MG tablet, take 1 tablet by mouth at bedtime, Disp: 30 tablet, Rfl: 5 .  gabapentin (NEURONTIN) 100 MG capsule, TAKE 2 CAPSULES BY MOUTH EVERY MORNING AND 2 AT LUNCH, Disp: 360 capsule, Rfl: 3 .  gabapentin (NEURONTIN) 300 MG capsule, take 1 capsule by mouth at bedtime, Disp: 90 capsule, Rfl: 3 .  Hydrocodone-Acetaminophen 5-300 MG TABS, Take 1 tablet by mouth 2 (two) times daily as needed., Disp: 60 each, Rfl: 0 .  levothyroxine (SYNTHROID, LEVOTHROID) 88 MCG tablet, take 1 tablet by mouth once daily, Disp: 90 tablet, Rfl: 1 .  losartan (COZAAR) 50 MG tablet, take 1 tablet by mouth at bedtime, Disp: 90  tablet, Rfl: 1 .  potassium chloride SA (K-DUR,KLOR-CON) 20 MEQ tablet, take 1 tablet by mouth twice a day, Disp: 180 tablet, Rfl: 1 .  topiramate (TOPAMAX) 100 MG tablet, Take 0.5 tablets (50 mg total) by mouth every evening. (Patient taking differently: Take 50 mg by mouth daily. ), Disp: 30 tablet, Rfl: 12 .  triamterene-hydrochlorothiazide (MAXZIDE) 75-50 MG tablet, Take 0.5 tablets by mouth daily., Disp: 90 tablet, Rfl: 3 .  verapamil (VERELAN PM) 120 MG 24 hr capsule, take 1 capsule by mouth once daily, Disp: 90 capsule, Rfl: 3 .  VESICARE 10 MG tablet, take 1 tablet by mouth once daily (Patient taking differently: take 1/2 tablet by mouth once daily), Disp: 90 tablet, Rfl: 4 .  zolpidem (AMBIEN) 10 MG tablet, take 1 tablet by mouth at bedtime, Disp: 90 tablet, Rfl: 1  Current Facility-Administered Medications:  .  0.9 %  sodium chloride infusion, 500 mL, Intravenous, Continuous, Ladene Artist, MD  Physical exam:  Vitals:   11/23/16 1405  BP: 114/77  Pulse: (!) 58  Resp: 18  Temp: (!) 96.2 F (35.7 C)  TempSrc: Tympanic  Weight: 179 lb 0.2 oz (81.2 kg)  Height: 5\' 5"  (1.651 m)   Physical Exam  Constitutional: She is oriented to person, place, and time and well-developed, well-nourished, and in no distress.  HENT:  Head: Normocephalic and atraumatic.  Eyes: Pupils are equal, round, and reactive to light. EOM are normal.  Neck: Normal range of motion.  Cardiovascular: Normal rate, regular rhythm and normal heart sounds.   Pulmonary/Chest: Effort normal and breath sounds normal.  Abdominal: Soft. Bowel sounds are normal.  No palpable splenomegaly  Lymphadenopathy:  No palpable cervical, supraclavicular, axillary or inguinal adenopathy  Neurological: She is alert and oriented to person, place, and time.  Skin: Skin is warm and dry.     CMP Latest Ref Rng & Units 07/20/2016  Glucose 65 - 99 mg/dL 113(H)  BUN 6 - 20 mg/dL 24(H)  Creatinine 0.44 - 1.00 mg/dL 0.89  Sodium  135 - 145 mmol/L 135  Potassium 3.5 - 5.1 mmol/L 3.8  Chloride 101 - 111 mmol/L 105  CO2 22 - 32 mmol/L 25  Calcium 8.9 - 10.3 mg/dL 8.9  Total Protein 6.5 - 8.1 g/dL 6.9  Total Bilirubin 0.3 - 1.2 mg/dL 0.4  Alkaline Phos 38 - 126 U/L 97  AST 15 - 41 U/L 20  ALT 14 - 54 U/L 18   CBC Latest Ref Rng & Units 11/23/2016  WBC 3.6 - 11.0 K/uL 12.6(H)  Hemoglobin 12.0 - 16.0 g/dL 13.2  Hematocrit 35.0 - 47.0 % 39.9  Platelets 150 - 440 K/uL 253     Assessment and plan- Patient is a 76 y.o. female with Rai stage 0 CLL  Wbc stable. No anemia or thrombocytopenia. No B symptoms. Continue to monitor. rtc in 1 year with cbc with diff. Interim cbc to be checked at 6 months. She will cal [place] sooner if she were to develop B symptoms such as fever/chills, unintentional weight loss or night sweats.   Visit Diagnosis 1. CLL (chronic lymphocytic leukemia) ([place])      Dr. Randa Evens, MD, MPH [facility] Pager- [phone] 11/23/2016 2:21 PM

## 2016-12-03 ENCOUNTER — Ambulatory Visit (INDEPENDENT_AMBULATORY_CARE_PROVIDER_SITE_OTHER): Payer: Medicare Other | Admitting: Physician Assistant

## 2016-12-03 ENCOUNTER — Encounter: Payer: Self-pay | Admitting: Physician Assistant

## 2016-12-03 VITALS — BP 120/60 | HR 58 | Temp 97.9°F | Resp 16 | Wt 180.0 lb

## 2016-12-03 DIAGNOSIS — K219 Gastro-esophageal reflux disease without esophagitis: Secondary | ICD-10-CM

## 2016-12-03 MED ORDER — PANTOPRAZOLE SODIUM 40 MG PO TBEC: 40.0000 mg | DELAYED_RELEASE_TABLET | Freq: Every day | ORAL | 3 refills | Status: DC

## 2016-12-03 NOTE — Progress Notes (Signed)
[name] Female    DOB: 1941-01-13   76 y.o.   MRN: [id number] Visit Date: 12/03/2016  Today's Provider: Mar Daring, PA-C   Chief Complaint  Patient presents with  . Abdominal Pain   Subjective:    HPI Abdominal Pain: Patient complains of abdominal pain. The pain is described as bloating. Pain is located in the epigastric without radiation. Onset was 1 month ago. Symptoms have been unchanged since. Aggravating factors: none.  Alleviating factors: Emetrol. Associated symptoms: nausea. The patient denies constipation, diarrhea and vomiting.  She was recently diagnosed with CLL but feels she is doing ok. She reports her most recent stressor has been dealing with a brother-in-law passing and her trying to help with taking care of his estate and seeing his siblings fighting over small things.     Allergies  Allergen Reactions  . Shellfish Allergy     Throat closes, rash  . Latex Rash    Tapes,adhesives     Current Outpatient Prescriptions:  .  ALPRAZolam (XANAX) 0.25 MG tablet, take 2 tablets by mouth at bedtime, Disp: 60 tablet, Rfl: 5 .  aspirin 81 MG tablet, Take 81 mg by mouth daily., Disp: , Rfl:  .  atenolol (TENORMIN) 50 MG tablet, take 1 tablet by mouth once daily, Disp: 90 tablet, Rfl: 1 .  BAYER CONTOUR TEST test strip, , Disp: , Rfl:  .  Cholecalciferol 50000 units capsule, Take 1 capsule PO q 4 weeks, Disp: 3 capsule, Rfl: 1 .  cyclobenzaprine (FLEXERIL) 10 MG tablet, take 1 tablet by mouth at bedtime, Disp: 30 tablet, Rfl: 11 .  donepezil (ARICEPT) 5 MG tablet, take 1 tablet by mouth at bedtime (Patient taking differently: take 1 tablet by mouth in the morning), Disp: 30 tablet, Rfl: 11 .  ezetimibe (ZETIA) 10 MG tablet, take 1 tablet by mouth at bedtime, Disp: 30 tablet, Rfl: 5 .  gabapentin (NEURONTIN) 100 MG capsule, TAKE 2 CAPSULES BY MOUTH EVERY MORNING AND 2 AT LUNCH, Disp: 360 capsule, Rfl: 3 .  gabapentin (NEURONTIN) 300 MG  capsule, take 1 capsule by mouth at bedtime, Disp: 90 capsule, Rfl: 3 .  Hydrocodone-Acetaminophen 5-300 MG TABS, Take 1 tablet by mouth 2 (two) times daily as needed., Disp: 60 each, Rfl: 0 .  levothyroxine (SYNTHROID, LEVOTHROID) 88 MCG tablet, take 1 tablet by mouth once daily, Disp: 90 tablet, Rfl: 1 .  losartan (COZAAR) 50 MG tablet, take 1 tablet by mouth at bedtime, Disp: 90 tablet, Rfl: 1 .  potassium chloride SA (K-DUR,KLOR-CON) 20 MEQ tablet, take 1 tablet by mouth twice a day, Disp: 180 tablet, Rfl: 1 .  topiramate (TOPAMAX) 100 MG tablet, Take 0.5 tablets (50 mg total) by mouth every evening. (Patient taking differently: Take 50 mg by mouth daily. ), Disp: 30 tablet, Rfl: 12 .  triamterene-hydrochlorothiazide (MAXZIDE) 75-50 MG tablet, Take 0.5 tablets by mouth daily., Disp: 90 tablet, Rfl: 3 .  verapamil (VERELAN PM) 120 MG 24 hr capsule, take 1 capsule by mouth once daily, Disp: 90 capsule, Rfl: 3 .  VESICARE 10 MG tablet, take 1 tablet by mouth once daily (Patient taking differently: No sig reported), Disp: 90 tablet, Rfl: 4 .  zolpidem (AMBIEN) 10 MG tablet, take 1 tablet by mouth at bedtime, Disp: 90 tablet, Rfl: 1  Current Facility-Administered Medications:  .  0.9 %  sodium chloride infusion, 500 mL, Intravenous, Continuous, Ladene Artist, MD  Review of Systems  Constitutional:  Negative.  Negative for fever.  Respiratory: Negative.   Cardiovascular: Negative.   Gastrointestinal: Positive for abdominal distention and nausea. Negative for abdominal pain, constipation, diarrhea and vomiting.  Musculoskeletal: Negative.   Neurological: Negative for dizziness, light-headedness and headaches.    Social History  Substance Use Topics  . Smoking status: Never Smoker  . Smokeless tobacco: Never Used  . Alcohol use 0.6 oz/week    1 Glasses of wine per week     Comment: Rare- wine on occasion   Objective:   BP 120/60 (BP Location: Left Arm, Patient Position: Sitting, Cuff  Size: Large)   Pulse (!) 58   Temp 97.9 F (36.6 C) (Oral)   Resp 16   Wt 180 lb (81.6 kg)   SpO2 97%   BMI 29.95 kg/m  Vitals:   12/03/16 1103  BP: 120/60  Pulse: (!) 58  Resp: 16  Temp: 97.9 F (36.6 C)  TempSrc: Oral  SpO2: 97%  Weight: 180 lb (81.6 kg)     Physical Exam  Constitutional: She is oriented to person, place, and time. She appears well-developed and well-nourished. No distress.  Cardiovascular: Normal rate, regular rhythm and normal heart sounds.  Exam reveals no gallop and no friction rub.   No murmur heard. Pulmonary/Chest: Effort normal and breath sounds normal. No respiratory distress. She has no wheezes. She has no rales.  Abdominal: Soft. Normal appearance and bowel sounds are normal. She exhibits no distension and no mass. There is no hepatosplenomegaly. There is tenderness in the epigastric area. There is no rebound, no guarding and no CVA tenderness.  Neurological: She is alert and oriented to person, place, and time.  Skin: Skin is warm and dry. She is not diaphoretic.  Vitals reviewed.      Assessment & Plan:     1. Gastroesophageal reflux disease, esophagitis presence not specified Suspect stress induced gastritis/GERD. Will give protonix as below. She is to try for 2 weeks. Discussed dietary changes as well which she has already started to make. If no improvement she is to call the office.  - pantoprazole (PROTONIX) 40 MG tablet; Take 1 tablet (40 mg total) by mouth daily.  Dispense: 30 tablet; Refill: [place], PA-C  [facility]

## 2016-12-03 NOTE — Patient Instructions (Signed)
Gastritis, Adult Gastritis is inflammation of the stomach. There are two kinds of gastritis:  Acute gastritis. This kind develops suddenly.  Chronic gastritis. This kind lasts for a long time.  Gastritis happens when the lining of the stomach becomes weak or gets damaged. Without treatment, gastritis can lead to stomach bleeding and ulcers. What are the causes? This condition may be caused by:  An infection.  Drinking too much alcohol.  Certain medicines.  Having too much acid in the stomach.  A disease of the intestines or stomach.  Stress.  What are the signs or symptoms? Symptoms of this condition include:  Pain or a burning in the upper abdomen.  Nausea.  Vomiting.  An uncomfortable feeling of fullness after eating.  In some cases, there are no symptoms. How is this diagnosed? This condition may be diagnosed with:  A description of your symptoms.  A physical exam.  Tests. These can include: ? Blood tests. ? Stool tests. ? A test in which a thin, flexible instrument with a light and camera on the end is passed down the esophagus and into the stomach (upper endoscopy). ? A test in which a sample of tissue is taken for testing (biopsy).  How is this treated? This condition may be treated with medicines. If the condition is caused by a bacterial infection, you may be given antibiotic medicines. If it is caused by too much acid in the stomach, you may get medicines called H2 blockers, proton pump inhibitors, or antacids. Treatment may also involve stopping the use of certain medicines, such as aspirin, ibuprofen, or other nonsteroidal anti-inflammatory drugs (NSAIDs). Follow these instructions at home:  Take over-the-counter and prescription medicines only as told by your health care provider.  If you were prescribed an antibiotic, take it as told by your health care provider. Do not stop taking the antibiotic even if you start to feel better.  Drink enough  fluid to keep your urine clear or pale yellow.  Eat small, frequent meals instead of large meals. Contact a health care provider if:  Your symptoms get worse.  Your symptoms return after treatment. Get help right away if:  You vomit blood or material that looks like coffee grounds.  You have black or dark red stools.  You are unable to keep fluids down.  Your abdominal pain gets worse.  You have a fever.  You do not feel better after 1 week. This information is not intended to replace advice given to you by your health care provider. Make sure you discuss any questions you have with your health care provider. Document Released: 03/31/2001 Document Revised: 12/04/2015 Document Reviewed: 12/29/2014 Elsevier Interactive Patient Education  2018 Elsevier Inc.  

## 2016-12-06 ENCOUNTER — Other Ambulatory Visit: Payer: Self-pay | Admitting: Physician Assistant

## 2016-12-06 DIAGNOSIS — G4700 Insomnia, unspecified: Secondary | ICD-10-CM

## 2016-12-07 NOTE — Telephone Encounter (Signed)
RX called in at [facility] pharmacy  

## 2016-12-24 ENCOUNTER — Other Ambulatory Visit: Payer: Self-pay | Admitting: Physician Assistant

## 2016-12-24 DIAGNOSIS — E039 Hypothyroidism, unspecified: Secondary | ICD-10-CM

## 2017-01-02 ENCOUNTER — Other Ambulatory Visit: Payer: Self-pay | Admitting: Family Medicine

## 2017-01-02 ENCOUNTER — Other Ambulatory Visit: Payer: Self-pay | Admitting: Neurology

## 2017-01-02 DIAGNOSIS — N3281 Overactive bladder: Secondary | ICD-10-CM

## 2017-01-22 ENCOUNTER — Other Ambulatory Visit: Payer: Self-pay | Admitting: Physician Assistant

## 2017-01-22 DIAGNOSIS — G4700 Insomnia, unspecified: Secondary | ICD-10-CM

## 2017-01-22 NOTE — Telephone Encounter (Signed)
rx called in-Casimir Barcellos V Azalea Cedar, RMA  

## 2017-02-06 ENCOUNTER — Other Ambulatory Visit: Payer: Self-pay | Admitting: Physician Assistant

## 2017-02-06 NOTE — Telephone Encounter (Signed)
Pharmacy requesting refills. Thanks!  

## 2017-03-05 ENCOUNTER — Other Ambulatory Visit: Payer: Self-pay | Admitting: Physician Assistant

## 2017-03-05 DIAGNOSIS — I10 Essential (primary) hypertension: Secondary | ICD-10-CM

## 2017-03-08 NOTE — Telephone Encounter (Signed)
Pharmacy requesting refills. Thanks!  

## 2017-03-18 LAB — HM MAMMOGRAPHY

## 2017-03-22 ENCOUNTER — Encounter: Payer: Self-pay | Admitting: Physician Assistant

## 2017-03-23 ENCOUNTER — Other Ambulatory Visit: Payer: Self-pay | Admitting: Physician Assistant

## 2017-03-23 DIAGNOSIS — K219 Gastro-esophageal reflux disease without esophagitis: Secondary | ICD-10-CM

## 2017-04-01 LAB — HM MAMMOGRAPHY

## 2017-04-02 ENCOUNTER — Encounter: Payer: Self-pay | Admitting: Physician Assistant

## 2017-04-02 ENCOUNTER — Telehealth: Payer: Self-pay

## 2017-04-02 NOTE — Telephone Encounter (Signed)
LMTCB We received the mammogram results from [facility] imaging. Results: BI-RADS Category:1. Repeat mammogram in 1 year.  Thanks,  -Johntay Doolen

## 2017-04-05 NOTE — Telephone Encounter (Signed)
Advised patient of results.  

## 2017-04-15 ENCOUNTER — Other Ambulatory Visit: Payer: Self-pay | Admitting: Physician Assistant

## 2017-04-15 DIAGNOSIS — E559 Vitamin D deficiency, unspecified: Secondary | ICD-10-CM

## 2017-04-20 HISTORY — PX: OTHER SURGICAL HISTORY: SHX169

## 2017-05-12 ENCOUNTER — Other Ambulatory Visit: Payer: Self-pay | Admitting: Neurology

## 2017-05-12 ENCOUNTER — Other Ambulatory Visit: Payer: Self-pay | Admitting: Physician Assistant

## 2017-05-12 DIAGNOSIS — I10 Essential (primary) hypertension: Secondary | ICD-10-CM

## 2017-05-23 ENCOUNTER — Telehealth: Payer: Self-pay | Admitting: *Deleted

## 2017-05-23 NOTE — Telephone Encounter (Signed)
Called patient and LVM informing patient that appt scheduled for Monday 2/4 @ 2:00 will need to be r/s. Left office number for patient to call when office opens tomorrow at 8.

## 2017-05-24 ENCOUNTER — Ambulatory Visit: Payer: Medicare Other | Admitting: Neurology

## 2017-05-25 NOTE — Telephone Encounter (Signed)
Called patient to r/s her appt. LVM with office number asking for call back.

## 2017-05-31 ENCOUNTER — Encounter: Payer: Self-pay | Admitting: Oncology

## 2017-05-31 ENCOUNTER — Inpatient Hospital Stay: Payer: Medicare Other | Admitting: Oncology

## 2017-05-31 ENCOUNTER — Inpatient Hospital Stay: Payer: Medicare Other | Attending: Oncology

## 2017-05-31 VITALS — BP 149/49 | HR 55 | Temp 97.2°F | Resp 18 | Ht 65.0 in | Wt 181.5 lb

## 2017-05-31 DIAGNOSIS — R252 Cramp and spasm: Secondary | ICD-10-CM | POA: Insufficient documentation

## 2017-05-31 DIAGNOSIS — G4762 Sleep related leg cramps: Secondary | ICD-10-CM

## 2017-05-31 DIAGNOSIS — D721 Eosinophilia, unspecified: Secondary | ICD-10-CM

## 2017-05-31 DIAGNOSIS — E559 Vitamin D deficiency, unspecified: Secondary | ICD-10-CM | POA: Diagnosis not present

## 2017-05-31 DIAGNOSIS — C9190 Lymphoid leukemia, unspecified not having achieved remission: Secondary | ICD-10-CM | POA: Diagnosis not present

## 2017-05-31 DIAGNOSIS — I10 Essential (primary) hypertension: Secondary | ICD-10-CM | POA: Insufficient documentation

## 2017-05-31 DIAGNOSIS — C9110 Chronic lymphocytic leukemia of B-cell type not having achieved remission: Secondary | ICD-10-CM | POA: Diagnosis not present

## 2017-05-31 DIAGNOSIS — E039 Hypothyroidism, unspecified: Secondary | ICD-10-CM | POA: Insufficient documentation

## 2017-05-31 LAB — CBC WITH DIFFERENTIAL/PLATELET
BASOS ABS: 0.1 10*3/uL (ref 0–0.1)
BASOS PCT: 1 %
EOS ABS: 0.9 10*3/uL — AB (ref 0–0.7)
EOS PCT: 7 %
HEMATOCRIT: 42.8 % (ref 35.0–47.0)
Hemoglobin: 14.2 g/dL (ref 12.0–16.0)
Lymphocytes Relative: 56 %
Lymphs Abs: 7.3 10*3/uL — ABNORMAL HIGH (ref 1.0–3.6)
MCH: 29.6 pg (ref 26.0–34.0)
MCHC: 33.3 g/dL (ref 32.0–36.0)
MCV: 89 fL (ref 80.0–100.0)
MONO ABS: 0.7 10*3/uL (ref 0.2–0.9)
MONOS PCT: 5 %
NEUTROS ABS: 4.1 10*3/uL (ref 1.4–6.5)
Neutrophils Relative %: 31 %
PLATELETS: 296 10*3/uL (ref 150–440)
RBC: 4.81 MIL/uL (ref 3.80–5.20)
RDW: 14.8 % — AB (ref 11.5–14.5)
WBC: 13 10*3/uL — ABNORMAL HIGH (ref 3.6–11.0)

## 2017-05-31 NOTE — Progress Notes (Signed)
Patient states when putting head back on chair, with getting up she states she has been having dizzy spells. Light headed upon standing.

## 2017-06-01 ENCOUNTER — Other Ambulatory Visit: Payer: Self-pay | Admitting: Physician Assistant

## 2017-06-01 DIAGNOSIS — G4700 Insomnia, unspecified: Secondary | ICD-10-CM

## 2017-06-02 NOTE — Telephone Encounter (Signed)
Prescription was phoned in to [facility].  Thanks,  -Konnar Ben

## 2017-06-03 NOTE — Progress Notes (Signed)
Hematology/Oncology Consult note [facility]  [phone] [phone]  Patient Care Team: Rubye Beach as PCP - General (Family Medicine) Monna Fam, MD as Consulting Physician (Ophthalmology) [name], MD as Consulting Physician (Neurology)   Name of the [name]  [id number]  1940-04-24   Date of visit: 06/03/17  Diagnosis- 1. 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 Mild eosinophilia  Chief complaint/ Reason for visit- routine f/u of CLL  Heme/Onc history: patient is a 77 year old female with a past medical history significant for hypertension, vitamin D deficiency and hypothyroidism. She has been sent to [place] for evaluation of leukocytosis. Over the last 1 year patient's white count has been around 12 with predominantly lymphocytosis and monocytosis as well as eosinophilia. No evidence of anemia or thrombocytopenia. Overall patient is doing well and denies any complaints of fevers, chills, unintentional weight loss, fatigue or night sweats. Denies any lumps or bumps anywhere  Further blood work from 07-2016 was as follows: CBC showed white count of 12.9 with an absolute lymphocyte count of 6.6 and an eosinophilia of 1.1. H&H was 13.4/41.6 and a platelet count of 270. CMP was unremarkable. Review of peripheral smear showed mild leukocytosis with absolute lymphocytosis and eosinophilia. BCR able testing was negative for CML. Flow cytometry showed CD5 positive, CD23 positive clonal B-cell population CLL/SLL phenotype CD38 negative. 36% of leukocytes are less than 5000/mcL. eosinophilia    Interval history- reports having leg cramps at night. She has tried cold compresses, leg elevation and walking but none of these measures are helping. Also reports that when she lifts her head up from lying position, she sometimes feels dizzy. Denies any fatigue, unintentional weight loss or night  sweats  ECOG PS- 0 Pain scale- 0   Review of systems- Review of Systems  Constitutional: Negative for chills, fever, malaise/fatigue and weight loss.  HENT: Negative for congestion, ear discharge and nosebleeds.   Eyes: Negative for blurred vision.  Respiratory: Negative for cough, hemoptysis, sputum production, shortness of breath and wheezing.   Cardiovascular: Negative for chest pain, palpitations, orthopnea and claudication.  Gastrointestinal: Negative for abdominal pain, blood in stool, constipation, diarrhea, heartburn, melena, nausea and vomiting.  Genitourinary: Negative for dysuria, flank pain, frequency, hematuria and urgency.  Musculoskeletal: Negative for back pain, joint pain and myalgias.       Nocturnal leg cramps  Skin: Negative for rash.  Neurological: Negative for dizziness, tingling, focal weakness, seizures, weakness and headaches.  Endo/Heme/Allergies: Does not bruise/bleed easily.  Psychiatric/Behavioral: Negative for depression and suicidal ideas. The patient does not have insomnia.      Allergies  Allergen Reactions  . Shellfish Allergy     Throat closes, rash  . Latex Rash    Tapes,adhesives     Past Medical History:  Diagnosis Date  . Allergy    some  . Cataract    bilaterally both eyes   . GERD (gastroesophageal reflux disease)   . Hydrocephalus    [facility]   . Hyperglycemia   . Hyperlipidemia   . Hypertension   . Lymphocytosis    monoclonal b cell  . Migraine, unspecified, without mention of intractable migraine without mention of status migrainosus 12/14/2012  . Osteoarthritis, chronic   . Osteoporosis   . Peptic ulcer    some bleeding with ulcers as well      Past Surgical History:  Procedure Laterality Date  . BREAST CYST ASPIRATION Left 1993  Removed  . breast cyst removed Right    benign   . COLONOSCOPY    . History of Cataract surgery Right 2011   left 2011  . PARTIAL HYSTERECTOMY    . POLYPECTOMY    . S/P ACL  repair Left    knee  . TUBAL LIGATION  1975   Bilateral    Social History   Socioeconomic History  . Marital status: Divorced    Spouse name: Not on file  . Number of children: 1  . Years of education: college  . Highest education level: Not on file  Social Needs  . Financial resource strain: Not on file  . Food insecurity - worry: Not on file  . Food insecurity - inability: Not on file  . Transportation needs - medical: Not on file  . Transportation needs - non-medical: Not on file  Occupational History  . Occupation: book Production designer, theatre/television/film: OTHER    Comment: Paediatric nurse  Tobacco Use  . Smoking status: Never Smoker  . Smokeless tobacco: Never Used  Substance and Sexual Activity  . Alcohol use: Yes    Alcohol/week: 0.6 oz    Types: 1 Glasses of wine per week    Comment: Rare- wine on occasion  . Drug use: No  . Sexual activity: Not on file  Other Topics Concern  . Not on file  Social History Narrative   Patient is right handed, resides alone. Divorced.    Family History  Problem Relation Age of Onset  . Colon cancer Mother   . Hypertension Mother   . Colon cancer Father   . Heart disease Father   . Hypertension Father   . Colon polyps Neg Hx   . Rectal cancer Neg Hx   . Stomach cancer Neg Hx      Current Outpatient Medications:  .  ALPRAZolam (XANAX) 0.25 MG tablet, take 2 tablets by mouth at bedtime, Disp: 60 tablet, Rfl: 5 .  aspirin 81 MG tablet, Take 81 mg by mouth daily., Disp: , Rfl:  .  atenolol (TENORMIN) 50 MG tablet, take 1 tablet by mouth once daily, Disp: 90 tablet, Rfl: 1 .  BAYER CONTOUR TEST test strip, , Disp: , Rfl:  .  cephALEXin (KEFLEX) 500 MG capsule, Take 500 mg by mouth 2 (two) times daily., Disp: , Rfl:  .  Cholecalciferol 50000 units capsule, take 1 capsule by mouth every 4 weeks, Disp: 4 capsule, Rfl: 5 .  cyclobenzaprine (FLEXERIL) 10 MG tablet, take 1 tablet by mouth at bedtime, Disp: 30 tablet, Rfl: 11 .  donepezil  (ARICEPT) 5 MG tablet, take 1 tablet by mouth at bedtime, Disp: 30 tablet, Rfl: 5 .  ezetimibe (ZETIA) 10 MG tablet, take 1 tablet by mouth at bedtime, Disp: 30 tablet, Rfl: 5 .  gabapentin (NEURONTIN) 100 MG capsule, TAKE 2 CAPSULES BY MOUTH EVERY MORNING AND 2 AT LUNCH, Disp: 360 capsule, Rfl: 3 .  gabapentin (NEURONTIN) 300 MG capsule, take 1 capsule by mouth at bedtime, Disp: 90 capsule, Rfl: 1 .  Hydrocodone-Acetaminophen 5-300 MG TABS, Take 1 tablet by mouth 2 (two) times daily as needed., Disp: 60 each, Rfl: 0 .  levothyroxine (SYNTHROID, LEVOTHROID) 88 MCG tablet, take 1 tablet by mouth once daily, Disp: 90 tablet, Rfl: 1 .  losartan (COZAAR) 50 MG tablet, take 1 tablet by mouth at bedtime, Disp: 90 tablet, Rfl: 1 .  pantoprazole (PROTONIX) 40 MG tablet, take 1 tablet by mouth once daily,  Disp: 90 tablet, Rfl: 3 .  potassium chloride SA (K-DUR,KLOR-CON) 20 MEQ tablet, take 1 tablet by mouth twice a day, Disp: 180 tablet, Rfl: 1 .  topiramate (TOPAMAX) 100 MG tablet, Take 0.5 tablets (50 mg total) by mouth every evening. (Patient taking differently: Take 50 mg by mouth daily. ), Disp: 30 tablet, Rfl: 12 .  triamterene-hydrochlorothiazide (MAXZIDE) 75-50 MG tablet, Take 0.5 tablets by mouth daily., Disp: 90 tablet, Rfl: 3 .  verapamil (VERELAN PM) 120 MG 24 hr capsule, take 1 capsule by mouth once daily, Disp: 90 capsule, Rfl: 1 .  VESICARE 10 MG tablet, take 1 tablet by mouth once daily, Disp: 90 tablet, Rfl: 4 .  zolpidem (AMBIEN) 10 MG tablet, TAKE 1 TABLET BY MOUTH AT BEDTIME, Disp: 90 tablet, Rfl: 1  Current Facility-Administered Medications:  .  0.9 %  sodium chloride infusion, 500 mL, Intravenous, Continuous, Ladene Artist, MD  Physical exam:  Vitals:   05/31/17 1407  BP: (!) 149/49  Pulse: (!) 55  Resp: 18  Temp: (!) 97.2 F (36.2 C)  TempSrc: Tympanic  Weight: 181 lb 8.8 oz (82.3 kg)  Height: 5\' 5"  (1.651 m)   Physical Exam  Constitutional: She is oriented to person,  place, and time and well-developed, well-nourished, and in no distress.  HENT:  Head: Normocephalic and atraumatic.  Eyes: EOM are normal. Pupils are equal, round, and reactive to light.  Neck: Normal range of motion.  Cardiovascular: Normal rate, regular rhythm and normal heart sounds.  Pulmonary/Chest: Effort normal and breath sounds normal.  Abdominal: Soft. Bowel sounds are normal.  No palpable splenomegaly  Lymphadenopathy:  No palpable adenopathy  Neurological: She is alert and oriented to person, place, and time.  Skin: Skin is warm and dry.     CMP Latest Ref Rng & Units 07/20/2016  Glucose 65 - 99 mg/dL 113(H)  BUN 6 - 20 mg/dL 24(H)  Creatinine 0.44 - 1.00 mg/dL 0.89  Sodium 135 - 145 mmol/L 135  Potassium 3.5 - 5.1 mmol/L 3.8  Chloride 101 - 111 mmol/L 105  CO2 22 - 32 mmol/L 25  Calcium 8.9 - 10.3 mg/dL 8.9  Total Protein 6.5 - 8.1 g/dL 6.9  Total Bilirubin 0.3 - 1.2 mg/dL 0.4  Alkaline Phos 38 - 126 U/L 97  AST 15 - 41 U/L 20  ALT 14 - 54 U/L 18   CBC Latest Ref Rng & Units 05/31/2017  WBC 3.6 - 11.0 K/uL 13.0(H)  Hemoglobin 12.0 - 16.0 g/dL 14.2  Hematocrit 35.0 - 47.0 % 42.8  Platelets 150 - 440 K/uL 296     Assessment and plan- Patient is a 77 y.o. female with Stage 0 CLL  From CLL standpoint she is doing well. Stable lymphocytosis. No other cytopenias. No B symptoms. No palpable adenopathy. No indication for treatment at this point. rtc in 6 months with cbc with diff  Eosinophilia: cause unclear. It has remained stable over last 1 year. Continue to monitor. Bcr abl negative for CML  Nocturnal leg cramps: I have asked her to speak to her pcp about this. Encourage oral hydration and continue measures she is already trying like walking and cold compresses  Her symptoms of occasional lightheadedness on change of position could be either orthostatic hypotension or middle ear pathology. She will discuss with her pcp regarding this   Visit Diagnosis 1. CLL  (chronic lymphocytic leukemia) ([place])   2. Eosinophilia   3. Nocturnal leg cramps      Dr.  Randa Evens, MD, MPH [address] at [facility] Pager- [phone] 06/03/2017 8:14 AM

## 2017-06-09 ENCOUNTER — Telehealth: Payer: Self-pay

## 2017-06-09 NOTE — Telephone Encounter (Signed)
LMTCB and schedule AWV with [facility]. -MM

## 2017-06-14 ENCOUNTER — Other Ambulatory Visit: Payer: Self-pay | Admitting: Physician Assistant

## 2017-06-14 DIAGNOSIS — E039 Hypothyroidism, unspecified: Secondary | ICD-10-CM

## 2017-06-14 NOTE — Telephone Encounter (Signed)
Refilled

## 2017-06-22 NOTE — Telephone Encounter (Signed)
LMTCB to schedule AWV. -MM 

## 2017-06-25 ENCOUNTER — Telehealth: Payer: Self-pay

## 2017-06-25 NOTE — Telephone Encounter (Signed)
noted 

## 2017-06-25 NOTE — Telephone Encounter (Signed)
Patient calling that she is having pain under her right breast. Sometimes with sharp pains. She reports that she has seen Tawanna Sat for this before but it just doesn't go away. She reports she has some nausea. She is taking the Protonix which she reports is not helping. Reports she is having leg cramps and back cramps.No slurred speech. Denies:SOB,Chest pain/pressure,blurred vision,arm pain,no dizziness,sweat,headache or lightheadedness. She requested an appointment for Monday or Tuesday. Gave appointment for Monday morning. Did advised patient to call 911 or go to the ED if worsening pain or develop any other symptoms. Please advise/Review.  Thanks,  -Taiga Lupinacci

## 2017-06-28 ENCOUNTER — Ambulatory Visit
Admission: RE | Admit: 2017-06-28 | Discharge: 2017-06-28 | Disposition: A | Discharge status: Home / Self Care | Payer: Medicare Other | Source: Ambulatory Visit | Attending: Physician Assistant | Admitting: Physician Assistant

## 2017-06-28 ENCOUNTER — Ambulatory Visit: Payer: Medicare Other | Admitting: Physician Assistant

## 2017-06-28 ENCOUNTER — Telehealth: Payer: Self-pay

## 2017-06-28 ENCOUNTER — Encounter: Payer: Self-pay | Admitting: Physician Assistant

## 2017-06-28 VITALS — BP 140/80 | HR 58 | Temp 97.9°F | Resp 16 | Wt 183.2 lb

## 2017-06-28 DIAGNOSIS — M940 Chondrocostal junction syndrome [Tietze]: Secondary | ICD-10-CM

## 2017-06-28 DIAGNOSIS — R0781 Pleurodynia: Secondary | ICD-10-CM | POA: Diagnosis present

## 2017-06-28 DIAGNOSIS — R252 Cramp and spasm: Secondary | ICD-10-CM

## 2017-06-28 MED ORDER — PREDNISONE 10 MG (21) PO TBPK: ORAL_TABLET | ORAL | 0 refills | Status: DC

## 2017-06-28 NOTE — Progress Notes (Signed)
[name] Female    DOB: [birth year]   77 y.o.   MRN: [id number] Visit Date: 06/28/2017  Today's Provider: Mar Daring, PA-C   Chief Complaint  Patient presents with  . Pain under breast   Subjective:    HPI Patient is here today with c/o dull pain under her right breast which she reports that she has been having but lately worsening in the last two months. Patient reports that she had been seen for this before like six (6) months ago and was treated for GERD. Per patient it gets aggravated with movement. She is UTD on her mammogram. She had a diagnostic and [place] November and December.  She also reports that she has leg been having leg cramps in the past 2 months.Occurs mostly a night.    Allergies  Allergen Reactions  . Shellfish Allergy     Throat closes, rash  . Latex Rash    Tapes,adhesives     Current Outpatient Medications:  .  ALPRAZolam (XANAX) 0.25 MG tablet, take 2 tablets by mouth at bedtime, Disp: 60 tablet, Rfl: 5 .  aspirin 81 MG tablet, Take 81 mg by mouth daily., Disp: , Rfl:  .  atenolol (TENORMIN) 50 MG tablet, take 1 tablet by mouth once daily, Disp: 90 tablet, Rfl: 1 .  BAYER CONTOUR TEST test strip, , Disp: , Rfl:  .  Cholecalciferol 50000 units capsule, take 1 capsule by mouth every 4 weeks, Disp: 4 capsule, Rfl: 5 .  cyclobenzaprine (FLEXERIL) 10 MG tablet, take 1 tablet by mouth at bedtime, Disp: 30 tablet, Rfl: 11 .  donepezil (ARICEPT) 5 MG tablet, take 1 tablet by mouth at bedtime, Disp: 30 tablet, Rfl: 5 .  ezetimibe (ZETIA) 10 MG tablet, take 1 tablet by mouth at bedtime, Disp: 30 tablet, Rfl: 5 .  gabapentin (NEURONTIN) 100 MG capsule, TAKE 2 CAPSULES BY MOUTH EVERY MORNING AND 2 AT LUNCH, Disp: 360 capsule, Rfl: 3 .  gabapentin (NEURONTIN) 300 MG capsule, take 1 capsule by mouth at bedtime, Disp: 90 capsule, Rfl: 1 .  Hydrocodone-Acetaminophen 5-300 MG TABS, Take 1 tablet by mouth 2 (two) times daily as needed., Disp: 60  each, Rfl: 0 .  levothyroxine (SYNTHROID, LEVOTHROID) 88 MCG tablet, TAKE 1 TABLET BY MOUTH ONCE DAILY, Disp: 90 tablet, Rfl: 1 .  losartan (COZAAR) 50 MG tablet, take 1 tablet by mouth at bedtime, Disp: 90 tablet, Rfl: 1 .  pantoprazole (PROTONIX) 40 MG tablet, take 1 tablet by mouth once daily, Disp: 90 tablet, Rfl: 3 .  potassium chloride SA (K-DUR,KLOR-CON) 20 MEQ tablet, take 1 tablet by mouth twice a day, Disp: 180 tablet, Rfl: 1 .  triamterene-hydrochlorothiazide (MAXZIDE) 75-50 MG tablet, Take 0.5 tablets by mouth daily., Disp: 90 tablet, Rfl: 3 .  verapamil (VERELAN PM) 120 MG 24 hr capsule, take 1 capsule by mouth once daily, Disp: 90 capsule, Rfl: 1 .  VESICARE 10 MG tablet, take 1 tablet by mouth once daily, Disp: 90 tablet, Rfl: 4 .  zolpidem (AMBIEN) 10 MG tablet, TAKE 1 TABLET BY MOUTH AT BEDTIME, Disp: 90 tablet, Rfl: 1 .  topiramate (TOPAMAX) 100 MG tablet, Take 0.5 tablets (50 mg total) by mouth every evening. (Patient not taking: Reported on 06/28/2017), Disp: 30 tablet, Rfl: 12  Current Facility-Administered Medications:  .  0.9 %  sodium chloride infusion, 500 mL, Intravenous, Continuous, Ladene Artist, MD  Review of Systems  Constitutional: Negative.   Respiratory:  Negative for cough, chest tightness and shortness of breath.   Cardiovascular: Negative for chest pain, palpitations and leg swelling.  Gastrointestinal: Negative.   Neurological: Negative.     Social History   Tobacco Use  . Smoking status: Never Smoker  . Smokeless tobacco: Never Used  Substance Use Topics  . Alcohol use: Yes    Alcohol/week: 0.6 oz    Types: 1 Glasses of wine per week    Comment: Rare- wine on occasion   Objective:   BP 140/80 (BP Location: Left Arm, Patient Position: Sitting, Cuff Size: Normal)   Pulse (!) 58   Temp 97.9 F (36.6 C) (Oral)   Resp 16   Wt 183 lb 3.2 oz (83.1 kg)   SpO2 96%   BMI 30.49 kg/m    Physical Exam  Constitutional: She appears well-developed  and well-nourished. No distress.  Neck: Normal range of motion. Neck supple.  Cardiovascular: Normal rate, regular rhythm and normal heart sounds. Exam reveals no gallop and no friction rub.  No murmur heard. Pulmonary/Chest: Effort normal and breath sounds normal. No respiratory distress. She has no wheezes. She has no rales. She exhibits tenderness.    Skin: She is not diaphoretic.  Vitals reviewed.       Assessment & Plan:     1. Rib pain on right side Xray ordered to r/o any bony abnormality since patient has CLL. Suspect costochondritis vs positional pain (rib pain from kyphosis). Will treat with prednisone taper as below. I will f/u pending xray results. She is to call the office if no improvement. - DG Ribs Unilateral Right; Future  2. Costochondritis See above medical treatment plan. - predniSONE (STERAPRED UNI-PAK 21 TAB) 10 MG (21) TBPK tablet; 6 day taper; take as directed on package instructions  Dispense: 21 tablet; Refill: 0  3. Nocturnal muscle cramps Try adding magnesium 250 mg tablet. If no improvement she is to call the office.        Mar Daring, PA-C  [facility]

## 2017-06-28 NOTE — Telephone Encounter (Signed)
-----   Message from Mar Daring, PA-C sent at 06/28/2017  9:32 AM EDT ----- Odette Horns of ribs unremarkable. Try prednisone and call if no improvements.

## 2017-06-28 NOTE — Telephone Encounter (Signed)
Pt advised.   Thanks,   -Tamra Koos  

## 2017-06-28 NOTE — Patient Instructions (Signed)
Magnesium 250 mg tablet at night to see if this helps nighttime cramping  Costochondritis Costochondritis is swelling and irritation (inflammation) of the tissue (cartilage) that connects your ribs to your breastbone (sternum). This causes pain in the front of your chest. The pain usually starts gradually and involves more than one rib. What are the causes? The exact cause of this condition is not always known. It results from stress on the cartilage where your ribs attach to your sternum. The cause of this stress could be:  Chest injury (trauma).  Exercise or activity, such as lifting.  Severe coughing.  What increases the risk? You may be at higher risk for this condition if you:  Are female.  Are 50?76 years old.  Recently started a new exercise or work activity.  Have low levels of vitamin D.  Have a condition that makes you cough frequently.  What are the signs or symptoms? The main symptom of this condition is chest pain. The pain:  Usually starts gradually and can be sharp or dull.  Gets worse with deep breathing, coughing, or exercise.  Gets better with rest.  May be worse when you press on the sternum-rib connection (tenderness).  How is this diagnosed? This condition is diagnosed based on your symptoms, medical history, and a physical exam. Your health care provider will check for tenderness when pressing on your sternum. This is the most important finding. You may also have tests to rule out other causes of chest pain. These may include:  A chest X-ray to check for lung problems.  An electrocardiogram (ECG) to see if you have a heart problem that could be causing the pain.  An imaging scan to rule out a chest or rib fracture.  How is this treated? This condition usually goes away on its own over time. Your health care provider may prescribe an NSAID to reduce pain and inflammation. Your health care provider may also suggest that you:  Rest and avoid  activities that make pain worse.  Apply heat or cold to the area to reduce pain and inflammation.  Do exercises to stretch your chest muscles.  If these treatments do not help, your health care provider may inject a numbing medicine at the sternum-rib connection to help relieve the pain. Follow these instructions at home:  Avoid activities that make pain worse. This includes any activities that use chest, abdominal, and side muscles.  If directed, put ice on the painful area: ? Put ice in a plastic bag. ? Place a towel between your skin and the bag. ? Leave the ice on for 20 minutes, 2-3 times a day.  If directed, apply heat to the affected area as often as told by your health care provider. Use the heat source that your health care provider recommends, such as a moist heat pack or a heating pad. ? Place a towel between your skin and the heat source. ? Leave the heat on for 20-30 minutes. ? Remove the heat if your skin turns bright red. This is especially important if you are unable to feel pain, heat, or cold. You may have a greater risk of getting burned.  Take over-the-counter and prescription medicines only as told by your health care provider.  Return to your normal activities as told by your health care provider. Ask your health care provider what activities are safe for you.  Keep all follow-up visits as told by your health care provider. This is important. Contact a health care provider  if:  You have chills or a fever.  Your pain does not go away or it gets worse.  You have a cough that does not go away (is persistent). Get help right away if:  You have shortness of breath. This information is not intended to replace advice given to you by your health care provider. Make sure you discuss any questions you have with your health care provider. Document Released: 01/14/2005 Document Revised: 10/25/2015 Document Reviewed: 07/31/2015 Elsevier Interactive Patient Education   [employer].

## 2017-07-01 ENCOUNTER — Other Ambulatory Visit: Payer: Self-pay | Admitting: Neurology

## 2017-07-05 ENCOUNTER — Ambulatory Visit: Payer: Medicare Other | Admitting: Neurology

## 2017-07-05 ENCOUNTER — Encounter: Payer: Self-pay | Admitting: Neurology

## 2017-07-05 VITALS — BP 173/80 | HR 66 | Ht 65.5 in | Wt 184.0 lb

## 2017-07-05 DIAGNOSIS — G3184 Mild cognitive impairment, so stated: Secondary | ICD-10-CM | POA: Diagnosis not present

## 2017-07-05 MED ORDER — DONEPEZIL HCL 10 MG PO TABS: 10.0000 mg | ORAL_TABLET | Freq: Every day | ORAL | 6 refills | Status: DC

## 2017-07-05 NOTE — Progress Notes (Signed)
[facility]    Provider:  Dr Jaynee Eagles Referring Provider: Florian Buff* Primary Care Physician:  Mar Daring, PA-C  CC: Memory loss  Interval history 2019: Patient's MMSE again 30/30 today. She feels her memory is still worsening, she has extensive family history of dementia on both sides. . She tripped over a cord and hit the door frame when she was working. Her nose was possibly broken, she had bruising all over the face. She did not go to the emergency room. No headaches. We titrated her down on the Topiramate because of memory complaints on multiple medications that could cause symptoms. No concussive symptoms.  She has been seeing [place] for multiple years at this point will send for formal memory testing.   Interval history 11/14/2015: Patient comes back today. She is feeling much better as far as memory loss go since decreasing the Topamax from 200-100. She sees no changes in her migraines 100 mg. We'll further decrease Topamax to 50 mg. 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 Montral cognitive assessment score was 23 but Mini-Mental status exam was 30 out of 30. We have clinical trial here, CREAD, discussed at length with patient and he should be a wonderful candidate for this clinical trial which includes imaging and no cost patient including PET amyloid scans which would be very interesting especially in this patient's case. I discussed this at length, in the research team also came in and discuss with her provided brochures. We will continue to follow her every 6 months.  Interval history 05/06/2015: She is having eye twitching with the Aricept, she had diarrhea with 10mg  and she went back to 5mg . Reviewed labs and MRI images with patient and compared them to 2010 MRI which is mostly stable. Showed chronic microvascular ischemic changes, discussed vascular risk factors and treatment.   She has migraines, every other day last 4-6 hours,  unilateral, either side, throbbing and pulsatin with light sensitivity, sounds sensitivity, nausea no vomiting. No aura. No medication obveruse. On Topamax. On Atenolol. Failed Depakote, Pamelor and Elavil, she has failed imitrex oral and takes shots which may or may not help. She can't work, they can be severe 10/10 on average 5-6/10. At least 16 or more migraines a month. For the last 5-10 years or longer.   HPI: Cathy Barnes is a 77 y.o. female here as a referral from Dr. Venia Minks for memory loss. PMHx of migraine, HTN, essential tremor. She is a patient of Dr. [name] and Ward Givens and is seeing me today. Symptoms started within the last 2 months. She is having problems with numbers. She is having some short-term memory changes but most significantly with numbers. 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 She is scared, something is not right. Started acutely and getting worse. She took some prednisone for her foot and then this problem started. That may be a coincidence. She has stopped the steroids. No new medication changes, nothing new. No head trauma or inciting events. No other short-term memory changes except sometimes she forgets little things when she is very tired. Her TSH is monitored by pcp for hypothyroidism. No snoring at night, not excessively tired during the day, she had a sleep test in the past. No alteration of consciousness. No other focal neurologic symptoms.   Review of Systems: Patient  complains of symptoms per HPI as well as the following symptoms: incontinence, dizzy, muscle cramps. Pertinent negatives per HPI. All others negative.  Social History   Socioeconomic History  . Marital status: Divorced    Spouse name: Not on file  . Number of children: 1  . Years of education: college  . Highest education level: Not on file  Social Needs    . Financial resource strain: Not on file  . Food insecurity - worry: Not on file  . Food insecurity - inability: Not on file  . Transportation needs - medical: Not on file  . Transportation needs - non-medical: Not on file  Occupational History  . Occupation: book Production designer, theatre/television/film: OTHER    Comment: Paediatric nurse  Tobacco Use  . Smoking status: Never Smoker  . Smokeless tobacco: Never Used  Substance and Sexual Activity  . Alcohol use: Yes    Alcohol/week: 0.6 oz    Types: 1 Glasses of wine per week    Comment: Rare- wine on occasion  . Drug use: No  . Sexual activity: Not on file  Other Topics Concern  . Not on file  Social History Narrative   Patient is right handed, resides alone. Divorced.    Family History  Problem Relation Age of Onset  . Colon cancer Mother   . Hypertension Mother   . Colon cancer Father   . Heart disease Father   . Hypertension Father   . Colon polyps Neg Hx   . Rectal cancer Neg Hx   . Stomach cancer Neg Hx     Past Medical History:  Diagnosis Date  . Allergy    some  . Cataract    bilaterally both eyes   . GERD (gastroesophageal reflux disease)   . Hydrocephalus    [facility]   . Hyperglycemia   . Hyperlipidemia   . Hypertension   . Lymphocytosis    monoclonal b cell  . Migraine, unspecified, without mention of intractable migraine without mention of status migrainosus 12/14/2012  . Osteoarthritis, chronic   . Osteoporosis   . Peptic ulcer    some bleeding with ulcers as well     Past Surgical History:  Procedure Laterality Date  . BREAST CYST ASPIRATION Left 1993   Removed  . breast cyst removed Right    benign   . COLONOSCOPY    . growth removal Left 2019   L wrist growth removed, abnormal cells  . History of Cataract surgery Right 2011   left 2011  . PARTIAL HYSTERECTOMY    . POLYPECTOMY    . S/P ACL repair Left    knee  . TUBAL LIGATION  1975   Bilateral    Current Outpatient Medications   Medication Sig Dispense Refill  . ALPRAZolam (XANAX) 0.25 MG tablet take 2 tablets by mouth at bedtime 60 tablet 5  . aspirin 81 MG tablet Take 81 mg by mouth daily.    [name] atenolol (TENORMIN) 50 MG tablet take 1 tablet by mouth once daily 90 tablet 1  . Cholecalciferol 50000 units capsule take 1 capsule by mouth every 4 weeks 4 capsule 5  . cyclobenzaprine (FLEXERIL) 10 MG tablet Take 1 tablet (10 mg total) by mouth at bedtime. Must keep appointment for further refills. 30 tablet 0  . donepezil (ARICEPT) 10 MG tablet Take 1 tablet (10 mg total) by mouth at bedtime. 90 tablet 6  . ezetimibe (ZETIA) 10 MG tablet take 1 tablet  by mouth at bedtime 30 tablet 5  . gabapentin (NEURONTIN) 100 MG capsule TAKE 2 CAPSULES BY MOUTH EVERY MORNING AND 2 AT LUNCH 360 capsule 3  . gabapentin (NEURONTIN) 300 MG capsule take 1 capsule by mouth at bedtime 90 capsule 1  . Hydrocodone-Acetaminophen 5-300 MG TABS Take 1 tablet by mouth 2 (two) times daily as needed. 60 each 0  . levothyroxine (SYNTHROID, LEVOTHROID) 88 MCG tablet TAKE 1 TABLET BY MOUTH ONCE DAILY 90 tablet 1  . losartan (COZAAR) 50 MG tablet take 1 tablet by mouth at bedtime 90 tablet 1  . Magnesium 250 MG TABS Take 1 tablet by mouth daily.    . pantoprazole (PROTONIX) 40 MG tablet take 1 tablet by mouth once daily 90 tablet 3  . potassium chloride SA (K-DUR,KLOR-CON) 20 MEQ tablet take 1 tablet by mouth twice a day 180 tablet 1  . triamterene-hydrochlorothiazide (MAXZIDE) 75-50 MG tablet Take 0.5 tablets by mouth daily. 90 tablet 3  . verapamil (VERELAN PM) 120 MG 24 hr capsule take 1 capsule by mouth once daily 90 capsule 1  . VESICARE 10 MG tablet take 1 tablet by mouth once daily 90 tablet 4  . zolpidem (AMBIEN) 10 MG tablet TAKE 1 TABLET BY MOUTH AT BEDTIME 90 tablet 1   Current Facility-Administered Medications  Medication Dose Route Frequency Provider Last Rate Last Dose  . 0.9 %  sodium chloride infusion  500 mL Intravenous Continuous  Ladene Artist, MD        Allergies as of 07/05/2017 - Review Complete 07/05/2017  Allergen Reaction Noted  . Shellfish allergy  12/17/2014  . Latex Rash 07/20/2016    Vitals: BP (!) 173/80 Comment: notified MD  Pulse 66   Ht 5' 5.5" (1.664 m)   Wt 184 lb (83.5 kg)   BMI 30.15 kg/m  Last Weight:  Wt Readings from Last 1 Encounters:  07/05/17 184 lb (83.5 kg)   Last Height:   Ht Readings from Last 1 Encounters:  07/05/17 5' 5.5" (1.664 m)   MMSE - Mini Mental State Exam 07/05/2017 05/18/2016 11/14/2015  Orientation to time 5 5 5   Orientation to Place 5 5 5   Registration 3 3 3   Attention/ Calculation 5 5 5   Recall 3 3 3   Language- name 2 objects 2 2 2   Language- repeat 1 1 1   Language- follow 3 step command 3 3 3   Language- read & follow direction 1 1 1   Write a sentence 1 1 1   Copy design 1 1 1   Total score 30 30 30    Cranial Nerves:  The pupils are equal, round, and reactive to light. Visual fields are full.. Extraocular movements are intact. The face is symmetric. Hearing intact. Voice is normal.  Gait:  Not ataxic  Motor Observation:  No asymmetry, no atrophy, and no involuntary movements noted. Tone:  Normal muscle tone.   Posture:  Posture is normal. normal erect   Assessment/Plan: 77 y.o. Absolutely lovely female here as a referral from Dr. Venia Minks for memory loss. MMSE 30/30 but MoCA 23/30, possibly mild cognitive impairment. Could also very well be medication related as she is on a lot of medications that can cause cognitive problems such as topamax, flexeril, neurontin, alprazolam. Will decrease Topamax even further she is on 50mg  qhs and will stop. Discussed medications with patient.  MRi of the brain unremarkable, chronic microvascular ischemic white matter changes. Discussed vascular risk factors and trestment (BP control, diet and exercise, statins, watching glucose)  B12, tsh unremarkable  MCI: will increase Aricept to 10mg  qhs,  we have been seeing this very lovely patient for many years who continues to feel impaired and has a FHx of dementia on both sides of the family.  At this point will send for formal cognitive testing.   Orders Placed This Encounter  Procedures  . Ambulatory referral to Neuropsychology    Sarina Ill, MD  [facility] [address] [place], [place] [address]  Phone [phone] Fax [phone]  A total of 25 minutes was spent face-to-face with this patient. Over half this time was spent on counseling patient on the MCI and different diagnostic and therapeutic options available.

## 2017-07-06 DIAGNOSIS — Z818 Family history of other mental and behavioral disorders: Secondary | ICD-10-CM | POA: Insufficient documentation

## 2017-07-15 ENCOUNTER — Telehealth: Payer: Self-pay | Admitting: Physician Assistant

## 2017-07-15 NOTE — Telephone Encounter (Signed)
Left patient message to call and schedule  AWV

## 2017-07-22 ENCOUNTER — Other Ambulatory Visit: Payer: Self-pay | Admitting: Physician Assistant

## 2017-07-22 DIAGNOSIS — G4700 Insomnia, unspecified: Secondary | ICD-10-CM

## 2017-07-29 ENCOUNTER — Other Ambulatory Visit: Payer: Self-pay | Admitting: Physician Assistant

## 2017-08-02 ENCOUNTER — Other Ambulatory Visit: Payer: Self-pay | Admitting: Neurology

## 2017-08-16 ENCOUNTER — Telehealth: Payer: Self-pay

## 2017-08-16 NOTE — Telephone Encounter (Signed)
LMTCB and schedule AWV and CPE. -MM 

## 2017-08-26 NOTE — Telephone Encounter (Signed)
Called pt several times and LM to CB. Pt has not returned the call. Closing encounter.  -MM

## 2017-09-23 ENCOUNTER — Ambulatory Visit: Payer: Medicare Other | Admitting: Physician Assistant

## 2017-09-23 ENCOUNTER — Encounter: Payer: Self-pay | Admitting: Physician Assistant

## 2017-09-23 VITALS — BP 120/60 | HR 57 | Temp 97.6°F | Resp 16 | Ht 66.0 in | Wt 187.0 lb

## 2017-09-23 DIAGNOSIS — R252 Cramp and spasm: Secondary | ICD-10-CM

## 2017-09-23 DIAGNOSIS — R5383 Other fatigue: Secondary | ICD-10-CM

## 2017-09-23 DIAGNOSIS — H8111 Benign paroxysmal vertigo, right ear: Secondary | ICD-10-CM | POA: Diagnosis not present

## 2017-09-23 DIAGNOSIS — E7800 Pure hypercholesterolemia, unspecified: Secondary | ICD-10-CM

## 2017-09-23 DIAGNOSIS — R635 Abnormal weight gain: Secondary | ICD-10-CM

## 2017-09-23 DIAGNOSIS — R0781 Pleurodynia: Secondary | ICD-10-CM | POA: Diagnosis not present

## 2017-09-23 DIAGNOSIS — R7309 Other abnormal glucose: Secondary | ICD-10-CM

## 2017-09-23 DIAGNOSIS — C9110 Chronic lymphocytic leukemia of B-cell type not having achieved remission: Secondary | ICD-10-CM

## 2017-09-23 DIAGNOSIS — C9190 Lymphoid leukemia, unspecified not having achieved remission: Secondary | ICD-10-CM | POA: Diagnosis not present

## 2017-09-23 DIAGNOSIS — R0789 Other chest pain: Secondary | ICD-10-CM

## 2017-09-23 NOTE — Progress Notes (Signed)
[name] Female    DOB: 09/18/1940   77 y.o.   MRN: [id number] Visit Date: 09/23/2017  Today's Provider: Mar Daring, PA-C   Chief Complaint  Patient presents with  . Chest Pain  . Weight Gain  . Leg Pain  . Dizziness   Subjective:    HPI Patient here today C/O recurrent and persistent rib pain on right side. Patient reports pain is worse when she sits or bends down. Patient reports taking several OTC medications and nothing has helped with pain.   Patient reports she has gain five pounds in the last 3 months despite diet and exercise.   Patient C/O persistent leg cramps even after taking magnesium daily.  Patient C/O dizziness on and off in the last 3 weeks last a few minutes. Patient reports dizziness happens at any time of the day. Patient reports dizziness happens when she is standing, sitting or in bed.     Allergies  Allergen Reactions  . Shellfish Allergy     Throat closes, rash  . Latex Rash    Tapes,adhesives     Current Outpatient Medications:  .  ALPRAZolam (XANAX) 0.25 MG tablet, TAKE 2 TABLETS BY MOUTH DAILY, Disp: 60 tablet, Rfl: 5 .  aspirin 81 MG tablet, Take 81 mg by mouth daily., Disp: , Rfl:  .  atenolol (TENORMIN) 50 MG tablet, take 1 tablet by mouth once daily, Disp: 90 tablet, Rfl: 1 .  Cholecalciferol 50000 units capsule, take 1 capsule by mouth every 4 weeks, Disp: 4 capsule, Rfl: 5 .  cyclobenzaprine (FLEXERIL) 10 MG tablet, TAKE 1 TABLET(10 MG) BY MOUTH AT BEDTIME, Disp: 30 tablet, Rfl: 5 .  donepezil (ARICEPT) 10 MG tablet, Take 1 tablet (10 mg total) by mouth at bedtime., Disp: 90 tablet, Rfl: 6 .  ezetimibe (ZETIA) 10 MG tablet, TAKE 1 TABLET BY MOUTH AT BEDTIME, Disp: 30 tablet, Rfl: 5 .  gabapentin (NEURONTIN) 100 MG capsule, TAKE 2 CAPSULES BY MOUTH EVERY MORNING AND 2 AT LUNCH, Disp: 360 capsule, Rfl: 3 .  gabapentin (NEURONTIN) 300 MG capsule, take 1 capsule by mouth at bedtime, Disp: 90 capsule, Rfl: 1 .   levothyroxine (SYNTHROID, LEVOTHROID) 88 MCG tablet, TAKE 1 TABLET BY MOUTH ONCE DAILY, Disp: 90 tablet, Rfl: 1 .  losartan (COZAAR) 50 MG tablet, take 1 tablet by mouth at bedtime, Disp: 90 tablet, Rfl: 1 .  Magnesium 250 MG TABS, Take 1 tablet by mouth daily., Disp: , Rfl:  .  pantoprazole (PROTONIX) 40 MG tablet, take 1 tablet by mouth once daily, Disp: 90 tablet, Rfl: 3 .  potassium chloride SA (K-DUR,KLOR-CON) 20 MEQ tablet, take 1 tablet by mouth twice a day, Disp: 180 tablet, Rfl: 1 .  triamterene-hydrochlorothiazide (MAXZIDE) 75-50 MG tablet, Take 0.5 tablets by mouth daily., Disp: 90 tablet, Rfl: 3 .  verapamil (VERELAN PM) 120 MG 24 hr capsule, take 1 capsule by mouth once daily, Disp: 90 capsule, Rfl: 1 .  VESICARE 10 MG tablet, take 1 tablet by mouth once daily, Disp: 90 tablet, Rfl: 4 .  zolpidem (AMBIEN) 10 MG tablet, TAKE 1 TABLET BY MOUTH AT BEDTIME, Disp: 90 tablet, Rfl: 1 .  Hydrocodone-Acetaminophen 5-300 MG TABS, Take 1 tablet by mouth 2 (two) times daily as needed. (Patient not taking: Reported on 09/23/2017), Disp: 60 each, Rfl: 0  Review of Systems  Constitutional: Positive for appetite change.  Respiratory: Negative.   Cardiovascular: Positive for chest pain and leg  swelling.  Gastrointestinal: Negative.   Musculoskeletal: Positive for myalgias.  Neurological: Positive for dizziness.    Social History   Tobacco Use  . Smoking status: Never Smoker  . Smokeless tobacco: Never Used  Substance Use Topics  . Alcohol use: Yes    Alcohol/week: 0.6 oz    Types: 1 Glasses of wine per week    Comment: Rare- wine on occasion   Objective:   BP 120/60 (BP Location: Left Arm, Patient Position: Sitting, Cuff Size: Large)   Pulse (!) 57   Temp 97.6 F (36.4 C) (Oral)   Resp 16   Ht 5\' 6"  (1.676 m)   Wt 187 lb (84.8 kg)   SpO2 96%   BMI 30.18 kg/m  Vitals:   09/23/17 0900  BP: 120/60  Pulse: (!) 57  Resp: 16  Temp: 97.6 F (36.4 C)  TempSrc: Oral  SpO2: 96%    Weight: 187 lb (84.8 kg)  Height: 5\' 6"  (1.676 m)     Physical Exam  Constitutional: She is oriented to person, place, and time. She appears well-developed and well-nourished. No distress.  Eyes: Pupils are equal, round, and reactive to light. Conjunctivae are normal. Right eye exhibits nystagmus. Right eye exhibits normal extraocular motion. Left eye exhibits normal extraocular motion.  Neck: Normal range of motion. Neck supple. No JVD present. No tracheal deviation present. No thyromegaly present.  Cardiovascular: Normal rate, regular rhythm and normal heart sounds. Exam reveals no gallop and no friction rub.  No murmur heard. Pulmonary/Chest: Effort normal and breath sounds normal. No respiratory distress. She has no wheezes. She has no rales.  Musculoskeletal:       Right lower leg: She exhibits edema.       Left lower leg: She exhibits edema.  Lymphadenopathy:    She has no cervical adenopathy.  Neurological: She is alert and oriented to person, place, and time. She has normal strength. No cranial nerve deficit or sensory deficit. She displays a negative Romberg sign. Coordination and gait normal.  Skin: She is not diaphoretic.  Vitals reviewed.       Assessment & Plan:     1. Rib pain on right side Suspect due to soft tissue being pinched due to poor posturing and kyphosis. Has had imaging and all normal. Only bothers her when she is in a position that would compress this area such as sitting. Discussed trying to improve posture and increase core strength. Discussed yoga and bracing.   2. Benign paroxysmal positional vertigo of right ear Advised on how to perform Epley maneuver. Will check labs as below and f/u pending results. Call if no improvements.  - TSH - Vitamin D (25 hydroxy) - Magnesium - B12  3. Fatigue, unspecified type Will check labs as below and f/u pending results. Possibly aggravation/progression of CLL.  - CBC w/Diff/Platelet - Comprehensive Metabolic  Panel (CMET) - TSH - Vitamin D (25 hydroxy) - Magnesium - B12  4. Weight gain Unsure of cause. Will check labs as below and f/u pending results. Discussed food diary to make sure not overeating.  - CBC w/Diff/Platelet - Comprehensive Metabolic Panel (CMET) - TSH - Vitamin D (25 hydroxy) - Magnesium - B12  5. Nocturnal muscle cramp Will check labs as below and f/u pending results.  - CBC w/Diff/Platelet - Comprehensive Metabolic Panel (CMET) - TSH - Vitamin D (25 hydroxy) - Magnesium - B12  6. Elevated glucose H/O this. Trying to diet and exercise. Will check labs as below and  f/u pending results. - CBC w/Diff/Platelet - Comprehensive Metabolic Panel (CMET) - HgB A1c  7. Hypercholesterolemia H/O this. On Zetia. Will check labs as below and f/u pending results. - Comprehensive Metabolic Panel (CMET) - Lipid Profile  8. 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 Most recent labs were stable.        Mar Daring, PA-C  [facility]

## 2017-09-23 NOTE — Patient Instructions (Addendum)
Rib belt for rib pain Yoga for posture  Flonase sensimist

## 2017-09-24 ENCOUNTER — Other Ambulatory Visit: Payer: Self-pay | Admitting: Emergency Medicine

## 2017-09-24 ENCOUNTER — Other Ambulatory Visit: Payer: Self-pay

## 2017-09-24 DIAGNOSIS — M9979 Connective tissue and disc stenosis of intervertebral foramina of abdomen and other regions: Secondary | ICD-10-CM

## 2017-09-24 DIAGNOSIS — E039 Hypothyroidism, unspecified: Secondary | ICD-10-CM

## 2017-09-24 LAB — COMPREHENSIVE METABOLIC PANEL
A/G RATIO: 2.1 (ref 1.2–2.2)
ALT: 22 IU/L (ref 0–32)
AST: 19 IU/L (ref 0–40)
Albumin: 4.2 g/dL (ref 3.5–4.8)
Alkaline Phosphatase: 112 IU/L (ref 39–117)
BUN/Creatinine Ratio: 28 (ref 12–28)
BUN: 27 mg/dL (ref 8–27)
CHLORIDE: 100 mmol/L (ref 96–106)
CO2: 24 mmol/L (ref 20–29)
Calcium: 9.2 mg/dL (ref 8.7–10.3)
Creatinine, Ser: 0.97 mg/dL (ref 0.57–1.00)
GFR calc Af Amer: 66 mL/min/{1.73_m2} (ref >59)
GFR calc non Af Amer: 57 mL/min/{1.73_m2} — ABNORMAL LOW (ref >59)
GLUCOSE: 117 mg/dL — AB (ref 65–99)
Globulin, Total: 2 g/dL (ref 1.5–4.5)
POTASSIUM: 3.6 mmol/L (ref 3.5–5.2)
Sodium: 141 mmol/L (ref 134–144)
Total Protein: 6.2 g/dL (ref 6.0–8.5)

## 2017-09-24 LAB — CBC WITH DIFFERENTIAL/PLATELET
BASOS ABS: 0.1 10*3/uL (ref 0.0–0.2)
Basos: 1 %
EOS (ABSOLUTE): 1.1 10*3/uL — AB (ref 0.0–0.4)
Eos: 9 %
Hematocrit: 40 % (ref 34.0–46.6)
Hemoglobin: 13.6 g/dL (ref 11.1–15.9)
IMMATURE GRANS (ABS): 0 10*3/uL (ref 0.0–0.1)
Immature Granulocytes: 0 %
LYMPHS ABS: 6.3 10*3/uL — AB (ref 0.7–3.1)
LYMPHS: 51 %
MCH: 30.2 pg (ref 26.6–33.0)
MCHC: 34 g/dL (ref 31.5–35.7)
MCV: 89 fL (ref 79–97)
MONOS ABS: 0.7 10*3/uL (ref 0.1–0.9)
Monocytes: 6 %
NEUTROS ABS: 4.1 10*3/uL (ref 1.4–7.0)
Neutrophils: 33 %
PLATELETS: 281 10*3/uL (ref 150–450)
RBC: 4.51 x10E6/uL (ref 3.77–5.28)
RDW: 14.8 % (ref 12.3–15.4)
WBC: 12.2 10*3/uL — ABNORMAL HIGH (ref 3.4–10.8)

## 2017-09-24 LAB — MAGNESIUM: MAGNESIUM: 2.1 mg/dL (ref 1.6–2.3)

## 2017-09-24 LAB — HEMOGLOBIN A1C
Est. average glucose Bld gHb Est-mCnc: 128 mg/dL
Hgb A1c MFr Bld: 6.1 % — ABNORMAL HIGH (ref 4.8–5.6)

## 2017-09-24 LAB — VITAMIN D 25 HYDROXY (VIT D DEFICIENCY, FRACTURES): Vit D, 25-Hydroxy: 31.7 ng/mL (ref 30.0–100.0)

## 2017-09-24 LAB — LIPID PANEL
CHOL/HDL RATIO: 3.9 ratio (ref 0.0–4.4)
Cholesterol, Total: 181 mg/dL (ref 100–199)
HDL: 47 mg/dL (ref >39)
LDL CALC: 95 mg/dL (ref 0–99)
TRIGLYCERIDES: 197 mg/dL — AB (ref 0–149)
VLDL CHOLESTEROL CAL: 39 mg/dL (ref 5–40)

## 2017-09-24 LAB — VITAMIN B12: VITAMIN B 12: 412 pg/mL (ref 232–1245)

## 2017-09-24 LAB — TSH: TSH: 4.74 u[IU]/mL — ABNORMAL HIGH (ref 0.450–4.500)

## 2017-09-24 MED ORDER — HYDROCODONE-ACETAMINOPHEN 5-300 MG PO TABS: 1.0000 | ORAL_TABLET | Freq: Two times a day (BID) | ORAL | 0 refills | Status: DC | PRN

## 2017-09-24 NOTE — Telephone Encounter (Signed)
Please Review

## 2017-09-24 NOTE — Telephone Encounter (Signed)
LMTCB  Thanks,  -Joseline 

## 2017-09-24 NOTE — Telephone Encounter (Signed)
-----   Message from Mar Daring, PA-C sent at 09/24/2017 10:41 AM EDT ----- Labs are holding steady with your WBC count. Sugar holding steady. Kidney and liver function normal. Thyroid is slightly elevated. This means the levothyroxine 46mcg may be subtherapeutic. We can increase dose to 172mcg or continue 40mcg and recheck labs in 4-6 weeks, your preference. Vit D, magnesium and B12 are all normal. Cholesterol normal.

## 2017-09-24 NOTE — Telephone Encounter (Signed)
Pt reports that she spoke with you about this rx yesterday and was going to be called in. She is needing this called in to [facility] [address].

## 2017-09-27 MED ORDER — LEVOTHYROXINE SODIUM 100 MCG PO TABS: 100.0000 ug | ORAL_TABLET | Freq: Every day | ORAL | 3 refills | Status: DC

## 2017-09-27 NOTE — Telephone Encounter (Signed)
Patient advised as below. Patient agreed to increase Levothyroxine to 10 mcg.

## 2017-11-16 ENCOUNTER — Other Ambulatory Visit: Payer: Self-pay | Admitting: Family Medicine

## 2017-11-16 DIAGNOSIS — I10 Essential (primary) hypertension: Secondary | ICD-10-CM

## 2017-11-17 ENCOUNTER — Telehealth: Payer: Self-pay | Admitting: Physician Assistant

## 2017-11-17 NOTE — Telephone Encounter (Signed)
I left a message asking the pt to call me at [phone] to schedule AWV w/ [facility]. VDM (DD) °

## 2017-11-22 ENCOUNTER — Ambulatory Visit: Payer: Medicare Other | Admitting: Physician Assistant

## 2017-11-22 ENCOUNTER — Encounter: Payer: Self-pay | Admitting: Physician Assistant

## 2017-11-22 VITALS — BP 142/80 | HR 58 | Temp 97.7°F | Resp 16 | Wt 190.0 lb

## 2017-11-22 DIAGNOSIS — H01001 Unspecified blepharitis right upper eyelid: Secondary | ICD-10-CM | POA: Diagnosis not present

## 2017-11-22 MED ORDER — ERYTHROMYCIN 5 MG/GM OP OINT
1.0000 | TOPICAL_OINTMENT | Freq: Every day | OPHTHALMIC | 0 refills | Status: DC
Start: 2017-11-22 — End: 2017-11-26

## 2017-11-22 NOTE — Patient Instructions (Signed)
Blepharitis  Blepharitis is inflammation of the eyelids. Blepharitis may happen with:   Reddish, scaly skin around the scalp and eyebrows.   Burning or itching of the eyelids.   Eye discharge at night that causes the eyelashes to stick together in the morning.   Eyelashes that fall out.   Sensitivity to light.    Follow these instructions at home:  Pay attention to any changes in how you look or feel. Follow these instructions to help with your condition:  Keeping Clean   Wash your hands often.   Wash your eyelids with warm water or with warm water that is mixed with a small amount of baby shampoo. Do this two times per day or as often as needed.   Wash your face and eyebrows at least once a day.   Use a clean towel each time you dry your eyelids. Do not use this towel to clean or dry other areas of your body. Do not share your towel with anyone.  General instructions   Avoid wearing makeup until you get better. Do not share makeup with anyone.   Avoid rubbing your eyes.   Apply warm compresses to your eyes 2 times per day for 10 minutes at a time, or as told by your health care provider.   If you were prescribed an antibiotic ointment or steroid drops, apply or use the medicine as told by your health care provider. Do not stop using the medicine even if you feel better.   Keep all follow-up visits as told by your health care provider. This is important.  Contact a health care provider if:   Your eyelids feel hot.   You have blisters or a rash on your eyelids.   The condition does not go away in 2-4 days.   The inflammation gets worse.  Get help right away if:   You have pain or redness that gets worse or spreads to other parts of your face.   Your vision changes.   You have pain when looking at lights or moving objects.   You have a fever.  This information is not intended to replace advice given to you by your health care provider. Make sure you discuss any questions you have with your health  care provider.  Document Released: 04/03/2000 Document Revised: 09/12/2015 Document Reviewed: 07/30/2014  Elsevier Interactive Patient Education  2018 [employer]

## 2017-11-22 NOTE — Progress Notes (Signed)
[name] Female    DOB: [birth year]   77 y.o.   MRN: [id number] Visit Date: 11/22/2017  Today's Provider: Mar Daring, PA-C   Chief Complaint  Patient presents with  . Belepharitis   Subjective:    HPI Patient here today with c/o right eye pain,swelling upper eye lid.  She reports she notice the pain on Saturday when she took her glasses off because her head was hurting. She reports awakening this morning with some discharge. Denies any visual disturbance or pain. States it just hurts to move her eyelid.  She reports that she started using some OTC medicine for stye yesterday.     Allergies  Allergen Reactions  . Shellfish Allergy     Throat closes, rash  . Latex Rash    Tapes,adhesives     Current Outpatient Medications:  .  ALPRAZolam (XANAX) 0.25 MG tablet, TAKE 2 TABLETS BY MOUTH DAILY, Disp: 60 tablet, Rfl: 5 .  aspirin 81 MG tablet, Take 81 mg by mouth daily., Disp: , Rfl:  .  atenolol (TENORMIN) 50 MG tablet, take 1 tablet by mouth once daily, Disp: 90 tablet, Rfl: 1 .  Cholecalciferol 50000 units capsule, take 1 capsule by mouth every 4 weeks, Disp: 4 capsule, Rfl: 5 .  cyclobenzaprine (FLEXERIL) 10 MG tablet, TAKE 1 TABLET(10 MG) BY MOUTH AT BEDTIME, Disp: 30 tablet, Rfl: 5 .  donepezil (ARICEPT) 10 MG tablet, Take 1 tablet (10 mg total) by mouth at bedtime., Disp: 90 tablet, Rfl: 6 .  ezetimibe (ZETIA) 10 MG tablet, TAKE 1 TABLET BY MOUTH AT BEDTIME, Disp: 30 tablet, Rfl: 5 .  gabapentin (NEURONTIN) 100 MG capsule, TAKE 2 CAPSULES BY MOUTH EVERY MORNING AND 2 AT LUNCH, Disp: 360 capsule, Rfl: 3 .  gabapentin (NEURONTIN) 300 MG capsule, take 1 capsule by mouth at bedtime, Disp: 90 capsule, Rfl: 1 .  HYDROcodone-Acetaminophen 5-300 MG TABS, Take 1 tablet by mouth 2 (two) times daily as needed., Disp: 60 each, Rfl: 0 .  levothyroxine (SYNTHROID, LEVOTHROID) 100 MCG tablet, Take 1 tablet (100 mcg total) by mouth daily., Disp: 90 tablet, Rfl:  3 .  losartan (COZAAR) 50 MG tablet, take 1 tablet by mouth at bedtime, Disp: 90 tablet, Rfl: 1 .  Magnesium 250 MG TABS, Take 1 tablet by mouth daily., Disp: , Rfl:  .  pantoprazole (PROTONIX) 40 MG tablet, take 1 tablet by mouth once daily, Disp: 90 tablet, Rfl: 3 .  potassium chloride SA (K-DUR,KLOR-CON) 20 MEQ tablet, take 1 tablet by mouth twice a day, Disp: 180 tablet, Rfl: 1 .  triamterene-hydrochlorothiazide (MAXZIDE) 75-50 MG tablet, TAKE 1/2 TABLET BY MOUTH DAILY, Disp: 90 tablet, Rfl: 1 .  verapamil (VERELAN PM) 120 MG 24 hr capsule, take 1 capsule by mouth once daily, Disp: 90 capsule, Rfl: 1 .  VESICARE 10 MG tablet, take 1 tablet by mouth once daily, Disp: 90 tablet, Rfl: 4 .  zolpidem (AMBIEN) 10 MG tablet, TAKE 1 TABLET BY MOUTH AT BEDTIME, Disp: 90 tablet, Rfl: 1  Review of Systems  Constitutional: Negative.   HENT: Negative.   Eyes: Positive for pain (eyelid not eye), discharge and redness (eyelid). Negative for visual disturbance.  Respiratory: Negative for shortness of breath.   Cardiovascular: Negative for chest pain, palpitations and leg swelling.  Neurological: Negative.     Social History   Tobacco Use  . Smoking status: Never Smoker  . Smokeless tobacco: Never Used  Substance Use Topics  .  Alcohol use: Yes    Alcohol/week: 0.6 oz    Types: 1 Glasses of wine per week    Comment: Rare- wine on occasion   Objective:   BP (!) 142/80 (BP Location: Left Arm, Patient Position: Sitting, Cuff Size: Normal)   Pulse (!) 58   Temp 97.7 F (36.5 C) (Oral)   Resp 16   Wt 190 lb (86.2 kg)   SpO2 97%   BMI 30.67 kg/m  Vitals:   11/22/17 1329  BP: (!) 142/80  Pulse: (!) 58  Resp: 16  Temp: 97.7 F (36.5 C)  TempSrc: Oral  SpO2: 97%  Weight: 190 lb (86.2 kg)     Physical Exam  Constitutional: She appears well-developed and well-nourished. No distress.  HENT:  Head: Normocephalic and atraumatic.  Right Ear: Hearing, tympanic membrane, external ear and  ear canal normal.  Left Ear: Hearing, tympanic membrane, external ear and ear canal normal.  Nose: Nose normal.  Mouth/Throat: Uvula is midline, oropharynx is clear and moist and mucous membranes are normal. No oropharyngeal exudate.  Eyes: Pupils are equal, round, and reactive to light. Conjunctivae and EOM are normal. Right eye exhibits discharge. Right eye exhibits no chemosis, no exudate and no hordeolum. No foreign body present in the right eye. Left eye exhibits no discharge. No scleral icterus.  Right upper eyelid is red, swollen and tender  Neck: Normal range of motion. Neck supple.  Cardiovascular: Normal rate, regular rhythm and normal heart sounds. Exam reveals no gallop and no friction rub.  No murmur heard. Pulmonary/Chest: Effort normal and breath sounds normal. No stridor. No respiratory distress. She has no wheezes. She has no rales.  Skin: Skin is warm and dry. She is not diaphoretic.  Vitals reviewed.       Assessment & Plan:     1. Blepharitis of right upper eyelid, unspecified type Continue warm compresses. Erythromycin ointment given as below. Call if symptoms worsen or if visual disturbances begin.  - erythromycin ophthalmic ointment; Place 1 application into the right eye at bedtime.  Dispense: 3.5 g; Refill: 0       Mar Daring, PA-C  [facility]

## 2017-11-26 ENCOUNTER — Other Ambulatory Visit: Payer: Self-pay

## 2017-11-26 ENCOUNTER — Telehealth: Payer: Self-pay | Admitting: Physician Assistant

## 2017-11-26 DIAGNOSIS — H01001 Unspecified blepharitis right upper eyelid: Secondary | ICD-10-CM

## 2017-11-26 DIAGNOSIS — C9110 Chronic lymphocytic leukemia of B-cell type not having achieved remission: Secondary | ICD-10-CM

## 2017-11-26 MED ORDER — AZITHROMYCIN 250 MG PO TABS: ORAL_TABLET | ORAL | 0 refills | Status: DC

## 2017-11-26 NOTE — Telephone Encounter (Signed)
Please Review

## 2017-11-26 NOTE — Telephone Encounter (Signed)
zpak sent in.

## 2017-11-26 NOTE — Telephone Encounter (Signed)
Pt was in Monday for an eye infection. She was prescribed an eye ointment.  She says her eye is not much better if any  Please advise   She uses [facility] [address]  CB# [phone] or [phone] or work [phone]  thanks [employer]

## 2017-11-26 NOTE — Telephone Encounter (Signed)
Advised patient

## 2017-11-27 ENCOUNTER — Other Ambulatory Visit: Payer: Self-pay | Admitting: Physician Assistant

## 2017-11-27 ENCOUNTER — Other Ambulatory Visit: Payer: Self-pay | Admitting: Neurology

## 2017-11-27 DIAGNOSIS — G4700 Insomnia, unspecified: Secondary | ICD-10-CM

## 2017-11-29 ENCOUNTER — Encounter: Payer: Self-pay | Admitting: Oncology

## 2017-11-29 ENCOUNTER — Inpatient Hospital Stay: Payer: Medicare Other

## 2017-11-29 ENCOUNTER — Inpatient Hospital Stay: Payer: Medicare Other | Attending: Oncology | Admitting: Oncology

## 2017-11-29 VITALS — BP 158/59 | HR 59 | Temp 97.0°F | Resp 18 | Ht 66.0 in | Wt 188.1 lb

## 2017-11-29 DIAGNOSIS — C9111 Chronic lymphocytic leukemia of B-cell type in remission: Secondary | ICD-10-CM

## 2017-11-29 DIAGNOSIS — E559 Vitamin D deficiency, unspecified: Secondary | ICD-10-CM

## 2017-11-29 DIAGNOSIS — E039 Hypothyroidism, unspecified: Secondary | ICD-10-CM | POA: Diagnosis not present

## 2017-11-29 DIAGNOSIS — Z79899 Other long term (current) drug therapy: Secondary | ICD-10-CM | POA: Diagnosis not present

## 2017-11-29 DIAGNOSIS — I10 Essential (primary) hypertension: Secondary | ICD-10-CM

## 2017-11-29 DIAGNOSIS — C9110 Chronic lymphocytic leukemia of B-cell type not having achieved remission: Secondary | ICD-10-CM

## 2017-11-29 DIAGNOSIS — Z7982 Long term (current) use of aspirin: Secondary | ICD-10-CM | POA: Insufficient documentation

## 2017-11-29 DIAGNOSIS — D721 Eosinophilia: Secondary | ICD-10-CM

## 2017-11-29 LAB — CBC WITH DIFFERENTIAL/PLATELET
Basophils Absolute: 0.1 10*3/uL (ref 0–0.1)
Basophils Relative: 1 %
Eosinophils Absolute: 1.3 10*3/uL — ABNORMAL HIGH (ref 0–0.7)
Eosinophils Relative: 10 %
HEMATOCRIT: 42.9 % (ref 35.0–47.0)
HEMOGLOBIN: 14.3 g/dL (ref 12.0–16.0)
LYMPHS ABS: 6.8 10*3/uL — AB (ref 1.0–3.6)
Lymphocytes Relative: 50 %
MCH: 29.6 pg (ref 26.0–34.0)
MCHC: 33.4 g/dL (ref 32.0–36.0)
MCV: 88.8 fL (ref 80.0–100.0)
MONO ABS: 0.7 10*3/uL (ref 0.2–0.9)
MONOS PCT: 5 %
NEUTROS ABS: 4.6 10*3/uL (ref 1.4–6.5)
NEUTROS PCT: 34 %
Platelets: 286 10*3/uL (ref 150–440)
RBC: 4.82 MIL/uL (ref 3.80–5.20)
RDW: 14.3 % (ref 11.5–14.5)
WBC: 13.6 10*3/uL — ABNORMAL HIGH (ref 3.6–11.0)

## 2017-11-29 NOTE — Progress Notes (Signed)
No new changes noted today 

## 2017-11-29 NOTE — Progress Notes (Signed)
Hematology/Oncology Consult note [facility]  [phone] [phone]  Patient Care Team: Rubye Beach as PCP - General (Family Medicine) Monna Fam, MD as Consulting Physician (Ophthalmology) [name], MD as Consulting Physician (Neurology)   Name of the [name]  [id number]  06/21/1940   Date of visit: 11/29/17  Diagnosis- 1.  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  Moderate eosinophilia possibly secondary to CLL etiology unclear  Chief complaint/ Reason for visit-routine follow-up of CLL  Heme/Onc history: patient is a 77 year old female with a past medical history significant for hypertension, vitamin D deficiency and hypothyroidism. She has been sent to [place] for evaluation of leukocytosis. Over the last 1 year patient's white count has been around 12 with predominantly lymphocytosis and monocytosis as well as eosinophilia. No evidence of anemia or thrombocytopenia. Overall patient is doing well and denies any complaints of fevers, chills, unintentional weight loss, fatigue or night sweats. Denies any lumps or bumps anywhere  Further blood work from 07-2016 was as follows: CBC showed white count of 12.9 with an absolute lymphocyte count of 6.6 and an eosinophilia of 1.1. H&H was 13.4/41.6 and a platelet count of 270. CMP was unremarkable. Review of peripheral smear showed mild leukocytosis with absolute lymphocytosis and eosinophilia. BCR able testing was negative for CML. Flow cytometry showed CD5 positive, CD23 positive clonal B-cell population CLL/SLL phenotype CD38 negative. 36% of leukocytes are less than 5000/mcL. eosinophilia  Interval history-patient is currently recovering from a bout of blepharitis in her right eye for which she is on Z-Pak.  Other than that she denies any complaints such as fatigue, unintentional weight loss drenching night sweats or lumps or bumps anywhere.  In fact  patient has gained 5 few pounds over the last 3 to 4 months  ECOG PS- 0 Pain scale- 0   Review of systems- Review of Systems  Constitutional: Negative for chills, fever, malaise/fatigue and weight loss.  HENT: Negative for congestion, ear discharge and nosebleeds.   Eyes: Positive for pain and redness. Negative for blurred vision.  Respiratory: Negative for cough, hemoptysis, sputum production, shortness of breath and wheezing.   Cardiovascular: Negative for chest pain, palpitations, orthopnea and claudication.  Gastrointestinal: Negative for abdominal pain, blood in stool, constipation, diarrhea, heartburn, melena, nausea and vomiting.  Genitourinary: Negative for dysuria, flank pain, frequency, hematuria and urgency.  Musculoskeletal: Negative for back pain, joint pain and myalgias.  Skin: Negative for rash.  Neurological: Negative for dizziness, tingling, focal weakness, seizures, weakness and headaches.  Endo/Heme/Allergies: Does not bruise/bleed easily.  Psychiatric/Behavioral: Negative for depression and suicidal ideas. The patient does not have insomnia.      Allergies  Allergen Reactions  . Shellfish Allergy     Throat closes, rash  . Latex Rash    Tapes,adhesives     Past Medical History:  Diagnosis Date  . Allergy    some  . Cataract    bilaterally both eyes   . GERD (gastroesophageal reflux disease)   . Hydrocephalus    [facility]   . Hyperglycemia   . Hyperlipidemia   . Hypertension   . Lymphocytosis    monoclonal b cell  . Migraine, unspecified, without mention of intractable migraine without mention of status migrainosus 12/14/2012  . Osteoarthritis, chronic   . Osteoporosis   . Peptic ulcer    some bleeding with ulcers as well      Past Surgical History:  Procedure Laterality Date  .  BREAST CYST ASPIRATION Left 1993   Removed  . breast cyst removed Right    benign   . COLONOSCOPY    . growth removal Left 2019   L wrist growth removed,  abnormal cells  . History of Cataract surgery Right 2011   left 2011  . PARTIAL HYSTERECTOMY    . POLYPECTOMY    . S/P ACL repair Left    knee  . TUBAL LIGATION  1975   Bilateral    Social History   Socioeconomic History  . Marital status: Divorced    Spouse name: Not on file  . Number of children: 1  . Years of education: college  . Highest education level: Not on file  Occupational History  . Occupation: book Production designer, theatre/television/film: OTHER    Comment: Paediatric nurse  Social Needs  . Financial resource strain: Not on file  . Food insecurity:    Worry: Not on file    Inability: Not on file  . Transportation needs:    Medical: Not on file    Non-medical: Not on file  Tobacco Use  . Smoking status: Never Smoker  . Smokeless tobacco: Never Used  Substance and Sexual Activity  . Alcohol use: Yes    Alcohol/week: 1.0 standard drinks    Types: 1 Glasses of wine per week    Comment: Rare- wine on occasion  . Drug use: No  . Sexual activity: Not on file  Lifestyle  . Physical activity:    Days per week: Not on file    Minutes per session: Not on file  . Stress: Not on file  Relationships  . Social connections:    Talks on phone: Not on file    Gets together: Not on file    Attends religious service: Not on file    Active member of club or organization: Not on file    Attends meetings of clubs or organizations: Not on file    Relationship status: Not on file  . Intimate partner violence:    Fear of current or ex partner: Not on file    Emotionally abused: Not on file    Physically abused: Not on file    Forced sexual activity: Not on file  Other Topics Concern  . Not on file  Social History Narrative   Patient is right handed, resides alone. Divorced.    Family History  Problem Relation Age of Onset  . Colon cancer Mother   . Hypertension Mother   . Colon cancer Father   . Heart disease Father   . Hypertension Father   . Colon polyps Neg Hx   . Rectal  cancer Neg Hx   . Stomach cancer Neg Hx      Current Outpatient Medications:  .  ALPRAZolam (XANAX) 0.25 MG tablet, TAKE 2 TABLETS BY MOUTH DAILY, Disp: 60 tablet, Rfl: 5 .  aspirin 81 MG tablet, Take 81 mg by mouth daily., Disp: , Rfl:  .  atenolol (TENORMIN) 50 MG tablet, take 1 tablet by mouth once daily, Disp: 90 tablet, Rfl: 1 .  azithromycin (ZITHROMAX) 250 MG tablet, Take 2 tabs PO on day 1, then 1 tab PO daily until completed, Disp: 6 tablet, Rfl: 0 .  Cholecalciferol 50000 units capsule, take 1 capsule by mouth every 4 weeks, Disp: 4 capsule, Rfl: 5 .  cyclobenzaprine (FLEXERIL) 10 MG tablet, TAKE 1 TABLET(10 MG) BY MOUTH AT BEDTIME, Disp: 30 tablet, Rfl: 5 .  donepezil (  ARICEPT) 10 MG tablet, Take 1 tablet (10 mg total) by mouth at bedtime., Disp: 90 tablet, Rfl: 6 .  ezetimibe (ZETIA) 10 MG tablet, TAKE 1 TABLET BY MOUTH AT BEDTIME, Disp: 30 tablet, Rfl: 5 .  gabapentin (NEURONTIN) 100 MG capsule, TAKE 2 CAPSULES BY MOUTH EVERY MORNING AND 2 AT LUNCH, Disp: 360 capsule, Rfl: 3 .  gabapentin (NEURONTIN) 300 MG capsule, TAKE 1 CAPSULE BY MOUTH AT BEDTIME, Disp: 90 capsule, Rfl: 0 .  HYDROcodone-Acetaminophen 5-300 MG TABS, Take 1 tablet by mouth 2 (two) times daily as needed., Disp: 60 each, Rfl: 0 .  levothyroxine (SYNTHROID, LEVOTHROID) 100 MCG tablet, Take 1 tablet (100 mcg total) by mouth daily., Disp: 90 tablet, Rfl: 3 .  losartan (COZAAR) 50 MG tablet, take 1 tablet by mouth at bedtime, Disp: 90 tablet, Rfl: 1 .  Magnesium 250 MG TABS, Take 1 tablet by mouth daily., Disp: , Rfl:  .  pantoprazole (PROTONIX) 40 MG tablet, take 1 tablet by mouth once daily, Disp: 90 tablet, Rfl: 3 .  potassium chloride SA (K-DUR,KLOR-CON) 20 MEQ tablet, take 1 tablet by mouth twice a day, Disp: 180 tablet, Rfl: 1 .  triamterene-hydrochlorothiazide (MAXZIDE) 75-50 MG tablet, TAKE 1/2 TABLET BY MOUTH DAILY, Disp: 90 tablet, Rfl: 1 .  verapamil (VERELAN PM) 120 MG 24 hr capsule, take 1 capsule by  mouth once daily, Disp: 90 capsule, Rfl: 1 .  VESICARE 10 MG tablet, take 1 tablet by mouth once daily, Disp: 90 tablet, Rfl: 4 .  zolpidem (AMBIEN) 10 MG tablet, TAKE 1 TABLET BY MOUTH EVERY DAY AT BEDTIME, Disp: 90 tablet, Rfl: 1  Physical exam:  Vitals:   11/29/17 1335  BP: (!) 158/59  Pulse: (!) 59  Resp: 18  Temp: (!) 97 F (36.1 C)  TempSrc: Tympanic  SpO2: 97%  Weight: 188 lb 0.8 oz (85.3 kg)  Height: 5\' 6"  (1.676 m)   Physical Exam  Constitutional: She is oriented to person, place, and time. She appears well-developed and well-nourished.  HENT:  Head: Normocephalic and atraumatic.  Mild conjunctival injection and lid erythema noted over the right eye  Eyes: Pupils are equal, round, and reactive to light. EOM are normal.  Neck: Normal range of motion.  Cardiovascular: Normal rate, regular rhythm and normal heart sounds.  Pulmonary/Chest: Effort normal and breath sounds normal.  Abdominal: Soft. Bowel sounds are normal.  Neurological: She is alert and oriented to person, place, and time.  Skin: Skin is warm and dry.     CMP Latest Ref Rng & Units 09/23/2017  Glucose 65 - 99 mg/dL 117(H)  BUN 8 - 27 mg/dL 27  Creatinine 0.57 - 1.00 mg/dL 0.97  Sodium 134 - 144 mmol/L 141  Potassium 3.5 - 5.2 mmol/L 3.6  Chloride 96 - 106 mmol/L 100  CO2 20 - 29 mmol/L 24  Calcium 8.7 - 10.3 mg/dL 9.2  Total Protein 6.0 - 8.5 g/dL 6.2  Total Bilirubin 0.0 - 1.2 mg/dL <0.2  Alkaline Phos 39 - 117 IU/L 112  AST 0 - 40 IU/L 19  ALT 0 - 32 IU/L 22   CBC Latest Ref Rng & Units 11/29/2017  WBC 3.6 - 11.0 K/uL 13.6(H)  Hemoglobin 12.0 - 16.0 g/dL 14.3  Hematocrit 35.0 - 47.0 % 42.9  Platelets 150 - 440 K/uL 286      Assessment and plan- Patient is a 77 y.o. female with following issues:  1.  Rai stage 0 CLL: Her white count has been stable  between 12-13 and her absolute lymphocyte count over the last 2 years has remained stable around 6.7-6.8.  Her hemoglobin and platelet counts are  normal.  She has Rai stage 0 CLL which is under observation and does not require treatment at this time.  2.  Eosinophilia: Patient noted to have moderate eosinophilia with an absolute eosinophil count between 0.9-1.3.  This may be possibly secondary to CLL.  Patient does not give any history of asthma or other allergic symptoms.  No history of any joint pain or joint swelling.  Continue to monitor  I will see her back in 6 months with a repeat CBC with differential mainly to monitor her eosinophil count.   Visit Diagnosis 1. CLL (chronic lymphocytic leukemia) ([place])      Dr. Randa Evens, MD, MPH [facility] [phone] 11/29/2017 4:54 PM

## 2017-12-14 ENCOUNTER — Other Ambulatory Visit: Payer: Self-pay | Admitting: Neurology

## 2017-12-17 ENCOUNTER — Other Ambulatory Visit: Payer: Self-pay | Admitting: Physician Assistant

## 2017-12-17 DIAGNOSIS — E039 Hypothyroidism, unspecified: Secondary | ICD-10-CM

## 2017-12-27 ENCOUNTER — Other Ambulatory Visit: Payer: Self-pay | Admitting: Physician Assistant

## 2017-12-27 DIAGNOSIS — I10 Essential (primary) hypertension: Secondary | ICD-10-CM

## 2018-01-03 ENCOUNTER — Encounter: Payer: Self-pay | Admitting: Physician Assistant

## 2018-01-03 ENCOUNTER — Ambulatory Visit: Payer: Medicare Other | Admitting: Physician Assistant

## 2018-01-03 VITALS — BP 140/68 | HR 61 | Temp 97.9°F | Resp 16 | Wt 192.4 lb

## 2018-01-03 DIAGNOSIS — C9190 Lymphoid leukemia, unspecified not having achieved remission: Secondary | ICD-10-CM

## 2018-01-03 DIAGNOSIS — C9110 Chronic lymphocytic leukemia of B-cell type not having achieved remission: Secondary | ICD-10-CM

## 2018-01-03 DIAGNOSIS — Z23 Encounter for immunization: Secondary | ICD-10-CM | POA: Diagnosis not present

## 2018-01-03 DIAGNOSIS — K5900 Constipation, unspecified: Secondary | ICD-10-CM

## 2018-01-03 DIAGNOSIS — R140 Abdominal distension (gaseous): Secondary | ICD-10-CM

## 2018-01-03 DIAGNOSIS — R112 Nausea with vomiting, unspecified: Secondary | ICD-10-CM

## 2018-01-03 DIAGNOSIS — K8021 Calculus of gallbladder without cholecystitis with obstruction: Secondary | ICD-10-CM

## 2018-01-03 MED ORDER — ONDANSETRON HCL 4 MG PO TABS: 4.0000 mg | ORAL_TABLET | Freq: Three times a day (TID) | ORAL | 0 refills | Status: DC | PRN

## 2018-01-03 NOTE — Progress Notes (Signed)
[name] Female    DOB: 1941/04/03   77 y.o.   MRN: [id number] Visit Date: 01/03/2018  Today's Provider: Mar Daring, PA-C   Chief Complaint  Patient presents with  . Abdominal Pain   Subjective:    Abdominal Pain  This is a new problem. The current episode started 1 to 4 weeks ago (3 weeks ago). The problem occurs daily. The problem has been gradually worsening. The pain is located in the generalized abdominal region. The pain is at a severity of 8/10 (8/10 when is gassy). The pain is moderate. The quality of the pain is colicky, cramping and aching. The abdominal pain does not radiate. Associated symptoms include constipation ("sometimes"), flatus and nausea. Pertinent negatives include no diarrhea, fever or vomiting. Nothing aggravates the pain. The pain is relieved by nothing. Treatments tried: Emotrol. The treatment provided no relief.   She reports that approx 3 weeks ago she started noticing she was bloated and gassy feeling. She noticed her stomach feeling hard and being more tender. She then started feeling nauseous and had decreased appetite. She also reports having 2 epsiodes where she felt choked and had to vomit up the food she was eating because it felt like it was stuck in her throat. She reports having this before but it was always lower esophageal area where she would feel this, but this time it was higher up near the sternoclavicular joint. She has also had intermittent constipation. Denies hematochezia, melena, hemaemesis, diarrhea. Does have h/o GERD and is on Protonix 40mg  daily. Never been told she has a hiatal hernia. She does still have her gallbladder. She does have PMH significant for CLL.   Patient continues to have the right rib pain with bending over but has been wearing a rib brace that helps.     Allergies  Allergen Reactions  . Shellfish Allergy     Throat closes, rash  . Latex Rash    Tapes,adhesives     Current Outpatient  Medications:  .  ALPRAZolam (XANAX) 0.25 MG tablet, TAKE 2 TABLETS BY MOUTH DAILY, Disp: 60 tablet, Rfl: 5 .  aspirin 81 MG tablet, Take 81 mg by mouth daily., Disp: , Rfl:  .  atenolol (TENORMIN) 50 MG tablet, take 1 tablet by mouth once daily, Disp: 90 tablet, Rfl: 1 .  Cholecalciferol 50000 units capsule, take 1 capsule by mouth every 4 weeks, Disp: 4 capsule, Rfl: 5 .  cyclobenzaprine (FLEXERIL) 10 MG tablet, TAKE 1 TABLET(10 MG) BY MOUTH AT BEDTIME, Disp: 30 tablet, Rfl: 5 .  donepezil (ARICEPT) 10 MG tablet, Take 1 tablet (10 mg total) by mouth at bedtime., Disp: 90 tablet, Rfl: 6 .  ezetimibe (ZETIA) 10 MG tablet, TAKE 1 TABLET BY MOUTH AT BEDTIME, Disp: 30 tablet, Rfl: 5 .  gabapentin (NEURONTIN) 100 MG capsule, TAKE 2 CAPSULES BY MOUTH EVERY MORING AND 2 AT LUNCH, Disp: 360 capsule, Rfl: 0 .  gabapentin (NEURONTIN) 300 MG capsule, TAKE 1 CAPSULE BY MOUTH AT BEDTIME, Disp: 90 capsule, Rfl: 0 .  HYDROcodone-Acetaminophen 5-300 MG TABS, Take 1 tablet by mouth 2 (two) times daily as needed., Disp: 60 each, Rfl: 0 .  levothyroxine (SYNTHROID, LEVOTHROID) 100 MCG tablet, Take 1 tablet (100 mcg total) by mouth daily., Disp: 90 tablet, Rfl: 3 .  losartan (COZAAR) 50 MG tablet, TAKE 1 TABLET BY MOUTH AT BEDTIME, Disp: 90 tablet, Rfl: 1 .  Magnesium 250 MG TABS, Take 1 tablet by mouth  daily., Disp: , Rfl:  .  pantoprazole (PROTONIX) 40 MG tablet, take 1 tablet by mouth once daily, Disp: 90 tablet, Rfl: 3 .  potassium chloride SA (K-DUR,KLOR-CON) 20 MEQ tablet, take 1 tablet by mouth twice a day, Disp: 180 tablet, Rfl: 1 .  triamterene-hydrochlorothiazide (MAXZIDE) 75-50 MG tablet, TAKE 1/2 TABLET BY MOUTH DAILY, Disp: 90 tablet, Rfl: 1 .  verapamil (VERELAN PM) 120 MG 24 hr capsule, TAKE 1 CAPSULE BY MOUTH ONCE DAILY, Disp: 90 capsule, Rfl: 0 .  VESICARE 10 MG tablet, take 1 tablet by mouth once daily, Disp: 90 tablet, Rfl: 4 .  zolpidem (AMBIEN) 10 MG tablet, TAKE 1 TABLET BY MOUTH EVERY DAY AT  BEDTIME, Disp: 90 tablet, Rfl: 1  Review of Systems  Constitutional: Negative for fever.  HENT: Positive for trouble swallowing. Negative for congestion, mouth sores, postnasal drip, sore throat and voice change.   Respiratory: Positive for choking.   Cardiovascular: Negative.   Gastrointestinal: Positive for abdominal distention, abdominal pain, constipation ("sometimes"), flatus and nausea. Negative for anal bleeding, blood in stool, diarrhea, rectal pain and vomiting.  Genitourinary: Negative.   Neurological: Negative.   Psychiatric/Behavioral: Negative.     Social History   Tobacco Use  . Smoking status: Never Smoker  . Smokeless tobacco: Never Used  Substance Use Topics  . Alcohol use: Yes    Alcohol/week: 1.0 standard drinks    Types: 1 Glasses of wine per week    Comment: Rare- wine on occasion   Objective:   BP 140/68 (BP Location: Right Arm, Patient Position: Sitting, Cuff Size: Normal)   Pulse 61   Temp 97.9 F (36.6 C) (Oral)   Resp 16   Wt 192 lb 6.4 oz (87.3 kg)   BMI 31.05 kg/m  Vitals:   01/03/18 1435  BP: 140/68  Pulse: 61  Resp: 16  Temp: 97.9 F (36.6 C)  TempSrc: Oral  Weight: 192 lb 6.4 oz (87.3 kg)     Physical Exam  Constitutional: She is oriented to person, place, and time. She appears well-developed and well-nourished. No distress.  Cardiovascular: Normal rate, regular rhythm and normal heart sounds. Exam reveals no gallop and no friction rub.  No murmur heard. Pulmonary/Chest: Effort normal and breath sounds normal. No respiratory distress. She has no wheezes. She has no rales.  Abdominal: Soft. Normal appearance and bowel sounds are normal. She exhibits no distension and no mass. There is no hepatosplenomegaly. There is tenderness in the right upper quadrant, epigastric area and left upper quadrant. There is no rebound, no guarding and no CVA tenderness.  Neurological: She is alert and oriented to person, place, and time.  Skin: Skin is  warm and dry. She is not diaphoretic.        Assessment & Plan:     1. Abdominal distension (gaseous) DDx: gastritis, cholelithiasis/choilecystitis, hiatal hernia, peptic/duodenal ulcer, pancreatic source, ? Worsening lymphadenopathy from CLL. Will get imaging as below for further investigation in symptoms. Patient given zofran for nausea/vomiting. I will f/u pending results of the CT scan and treat accordingly. She is to call if symptoms worsen or change in the meantime.  - CT Abdomen Pelvis W Contrast; Future  2. Non-intractable vomiting with nausea, unspecified vomiting type See above medical treatment plan. - CT Abdomen Pelvis W Contrast; Future - ondansetron (ZOFRAN) 4 MG tablet; Take 1 tablet (4 mg total) by mouth every 8 (eight) hours as needed.  Dispense: 20 tablet; Refill: 0  3. Constipation, unspecified constipation type Add  stool softener and/or Miralax prn. See above medical treatment plan. - CT Abdomen Pelvis W Contrast; Future  4. CLL (chronic lymphocytic leukemia) ([place]) See above medical treatment plan. - CT Abdomen Pelvis W Contrast; Future  5. Need for influenza vaccination Flu vaccine given today without complication. Patient sat upright for 15 minutes to check for adverse reaction before being released. - Flu vaccine HIGH DOSE PF       Mar Daring, PA-C  [facility]

## 2018-01-03 NOTE — Patient Instructions (Signed)
Abdominal Bloating °When you have abdominal bloating, your abdomen may feel full, tight, or painful. It may also look bigger than normal or swollen (distended). Common causes of abdominal bloating include: °· Swallowing air. °· Constipation. °· Problems digesting food. °· Eating too much. °· Irritable bowel syndrome. This is a condition that affects the large intestine. °· Lactose intolerance. This is an inability to digest lactose, a natural sugar in dairy products. °· Celiac disease. This is a condition that affects the ability to digest gluten, a protein found in some grains. °· Gastroparesis. This is a condition that slows down the movement of food in the stomach and small intestine. It is more common in people with diabetes mellitus. °· Gastroesophageal reflux disease (GERD). This is a digestive condition that makes stomach acid flow back into the esophagus. °· Urinary retention. This means that the body is holding onto urine, and the bladder cannot be emptied all the way. ° °Follow these instructions at home: °Eating and drinking °· Avoid eating too much. °· Try not to swallow air while talking or eating. °· Avoid eating while lying down. °· Avoid these foods and drinks: °? Foods that cause gas, such as broccoli, cabbage, cauliflower, and baked beans. °? Carbonated drinks. °? Hard candy. °? Chewing gum. °Medicines °· Take over-the-counter and prescription medicines only as told by your health care provider. °· Take probiotic medicines. These medicines contain live bacteria or yeasts that can help digestion. °· Take coated peppermint oil capsules. °Activity °· Try to exercise regularly. Exercise may help to relieve bloating that is caused by gas and relieve constipation. °General instructions °· Keep all follow-up visits as told by your health care provider. This is important. °Contact a health care provider if: °· You have nausea and vomiting. °· You have diarrhea. °· You have abdominal pain. °· You have  unusual weight loss or weight gain. °· You have severe pain, and medicines do not help. °Get help right away if: °· You have severe chest pain. °· You have trouble breathing. °· You have shortness of breath. °· You have trouble urinating. °· You have darker urine than normal. °· You have blood in your stools or have dark, tarry stools. °Summary °· Abdominal bloating means that the abdomen is swollen. °· Common causes of abdominal bloating are swallowing air, constipation, and problems digesting food. °· Avoid eating too much and avoid swallowing air. °· Avoid foods that cause gas, carbonated drinks, hard candy, and chewing gum. °This information is not intended to replace advice given to you by your health care provider. Make sure you discuss any questions you have with your health care provider. °Document Released: 05/08/2016 Document Revised: 05/08/2016 Document Reviewed: 05/08/2016 °Elsevier Interactive Patient Education © 2018 [employer] ° °

## 2018-01-06 ENCOUNTER — Ambulatory Visit: Payer: Medicare Other

## 2018-01-12 ENCOUNTER — Ambulatory Visit
Admission: RE | Admit: 2018-01-12 | Discharge: 2018-01-12 | Disposition: A | Discharge status: Home / Self Care | Payer: Medicare Other | Source: Ambulatory Visit | Attending: Physician Assistant | Admitting: Physician Assistant

## 2018-01-12 DIAGNOSIS — K5900 Constipation, unspecified: Secondary | ICD-10-CM | POA: Insufficient documentation

## 2018-01-12 DIAGNOSIS — K8020 Calculus of gallbladder without cholecystitis without obstruction: Secondary | ICD-10-CM | POA: Diagnosis not present

## 2018-01-12 DIAGNOSIS — R140 Abdominal distension (gaseous): Secondary | ICD-10-CM

## 2018-01-12 DIAGNOSIS — R112 Nausea with vomiting, unspecified: Secondary | ICD-10-CM | POA: Diagnosis present

## 2018-01-12 DIAGNOSIS — C9110 Chronic lymphocytic leukemia of B-cell type not having achieved remission: Secondary | ICD-10-CM

## 2018-01-12 DIAGNOSIS — I700 Atherosclerosis of aorta: Secondary | ICD-10-CM | POA: Diagnosis not present

## 2018-01-12 DIAGNOSIS — C9190 Lymphoid leukemia, unspecified not having achieved remission: Secondary | ICD-10-CM | POA: Insufficient documentation

## 2018-01-12 LAB — POCT I-STAT CREATININE: CREATININE: 1 mg/dL (ref 0.44–1.00)

## 2018-01-12 MED ORDER — IOPAMIDOL (ISOVUE-300) INJECTION 61%
100.0000 mL | Freq: Once | INTRAVENOUS | Status: AC | PRN
  Administered 2018-01-12: 100 mL via INTRAVENOUS

## 2018-01-13 ENCOUNTER — Telehealth: Payer: Self-pay

## 2018-01-13 ENCOUNTER — Other Ambulatory Visit: Payer: Self-pay | Admitting: Physician Assistant

## 2018-01-13 DIAGNOSIS — K838 Other specified diseases of biliary tract: Secondary | ICD-10-CM

## 2018-01-13 DIAGNOSIS — K8020 Calculus of gallbladder without cholecystitis without obstruction: Secondary | ICD-10-CM

## 2018-01-13 NOTE — Telephone Encounter (Signed)
-----   Message from Mar Daring, [place] sent at 01/13/2018  9:28 AM EDT ----- Per CT scan you have multiple gallstones and some dilatation of the ducts coming from the gallbladder and liver. It is recommended to f/u with an MRCP. May also benefit from a referral to general surgery for consideration of removal of the gallbladder. I will place order for MRCP as well as referral to general surgery.

## 2018-01-13 NOTE — Progress Notes (Signed)
Ordered MRCP and referred to gen surgery for gallstones and intrahepatic/extrahepatic ductal dilatation noted on CT abd/pelvis. R/O CBD stone

## 2018-01-13 NOTE — Telephone Encounter (Signed)
LMTCB

## 2018-01-14 NOTE — Telephone Encounter (Signed)
Advised patient as below.  

## 2018-01-15 ENCOUNTER — Other Ambulatory Visit: Payer: Self-pay | Admitting: Physician Assistant

## 2018-01-15 DIAGNOSIS — I10 Essential (primary) hypertension: Secondary | ICD-10-CM

## 2018-01-21 ENCOUNTER — Other Ambulatory Visit: Payer: Self-pay | Admitting: Physician Assistant

## 2018-01-21 DIAGNOSIS — R112 Nausea with vomiting, unspecified: Secondary | ICD-10-CM

## 2018-01-21 DIAGNOSIS — G4700 Insomnia, unspecified: Secondary | ICD-10-CM

## 2018-01-24 ENCOUNTER — Other Ambulatory Visit: Payer: Self-pay | Admitting: Neurology

## 2018-01-24 ENCOUNTER — Other Ambulatory Visit: Payer: Self-pay | Admitting: Physician Assistant

## 2018-01-24 DIAGNOSIS — E7800 Pure hypercholesterolemia, unspecified: Secondary | ICD-10-CM

## 2018-01-28 ENCOUNTER — Ambulatory Visit: Payer: Medicare Other

## 2018-01-31 ENCOUNTER — Telehealth: Payer: Self-pay

## 2018-01-31 ENCOUNTER — Ambulatory Visit
Admission: RE | Admit: 2018-01-31 | Discharge: 2018-01-31 | Disposition: A | Discharge status: Home / Self Care | Payer: Medicare Other | Source: Ambulatory Visit | Attending: Physician Assistant | Admitting: Physician Assistant

## 2018-01-31 DIAGNOSIS — K8020 Calculus of gallbladder without cholecystitis without obstruction: Secondary | ICD-10-CM | POA: Diagnosis present

## 2018-01-31 DIAGNOSIS — K8070 Calculus of gallbladder and bile duct without cholecystitis without obstruction: Secondary | ICD-10-CM | POA: Insufficient documentation

## 2018-01-31 NOTE — Addendum Note (Signed)
Addended by: Mar Daring on: 01/31/2018 04:30 PM   Modules accepted: Orders

## 2018-01-31 NOTE — Telephone Encounter (Signed)
Patient was advised and referral put in place.,PC

## 2018-01-31 NOTE — Telephone Encounter (Signed)
-----   Message from Mar Daring, [place] sent at 01/31/2018  4:28 PM EDT ----- MRCP does reveal multiple gallstones with 2 stones in the bile duct. I will refer to general surgery for further consideration.

## 2018-02-02 ENCOUNTER — Encounter: Payer: Self-pay | Admitting: Surgery

## 2018-02-02 ENCOUNTER — Ambulatory Visit: Payer: Medicare Other | Admitting: Surgery

## 2018-02-02 ENCOUNTER — Encounter: Payer: Self-pay | Admitting: *Deleted

## 2018-02-02 VITALS — BP 146/73 | HR 64 | Temp 97.7°F | Ht 65.5 in | Wt 194.6 lb

## 2018-02-02 DIAGNOSIS — K8050 Calculus of bile duct without cholangitis or cholecystitis without obstruction: Secondary | ICD-10-CM | POA: Diagnosis not present

## 2018-02-02 MED ORDER — GABAPENTIN 300 MG PO CAPS: ORAL_CAPSULE | ORAL | 0 refills | Status: DC

## 2018-02-02 NOTE — Progress Notes (Signed)
Patient to have the following labs drawn at [facility] today: CBC and CMP.   The patient has also been referred to Dr. Allen Norris at [place] for an ERCP.   A prescription for gabapentin has been sent in to [facility].

## 2018-02-02 NOTE — Patient Instructions (Addendum)
Endoscopic Retrograde Cholangiopancreatogram °Endoscopic retrograde cholangiopancreatogram (ERCP) is a procedure that may be used to diagnose or treat problems with the pancreas, bile ducts, liver, and gallbladder. For this procedure, a thin, lighted tube (endoscope) is passed through the mouth, the throat, and down into the areas being checked. The endoscope has a camera that allows the areas to be viewed. Dye is injected and then X-rays are taken to further study the areas. °During ERCP, other procedures may also be done to help diagnose or treat problems that are found. For example, stones can be removed, or a tissue sample can be taken out for testing (biopsy). °Tell a health care provider about: °· Any allergies you have. °· All medicines you are taking, including vitamins, herbs, eye drops, creams, and over-the-counter medicines. °· Any problems you or family members have had with anesthetic medicines. °· Any blood disorders you have. °· Any surgeries you have had. °· Any medical conditions you have. °· Whether you are pregnant or may be pregnant. °What are the risks? °Generally, this is a safe procedure. However, problems may occur, including: °· Pancreatitis. °· Infection. °· Bleeding. °· Allergic reactions to medicines or dyes. °· Accidental punctures in the bowel wall, pancreas, or gallbladder. °· Damage to other structures or organs. ° °What happens before the procedure? °Staying hydrated °Follow instructions from your health care provider about hydration, which may include: °· Up to 2 hours before the procedure - you may continue to drink clear liquids, such as water, clear fruit juice, black coffee, and plain tea. ° °Eating and drinking restrictions °Follow instructions from your health care provider about eating and drinking, which may include: °· 8 hours before the procedure - stop eating heavy meals or foods such as meat, fried foods, or fatty foods. °· 6 hours before the procedure - stop eating  light meals or foods, such as toast or cereal. °· 6 hours before the procedure - stop drinking milk or drinks that contain milk. °· 2 hours before the procedure - stop drinking clear liquids. ° °General instructions °· Ask your health care provider about: °? Changing or stopping your regular medicines. This is especially important if you are taking diabetes medicines or blood thinners. °? Taking medicines such as aspirin and ibuprofen. These medicines can thin your blood. Do not take these medicines before your procedure if your health care provider instructs you not to. °· Plan to have someone take you home from the hospital or clinic. °· If you will be going home right after the procedure, plan to have someone with you for 24 hours. °What happens during the procedure? °· To lower your risk of infection, your health care team will wash or sanitize their hands. °· An IV tube will be inserted into one of your veins. °· You will be given one or more of the following: °? A medicine to help you relax (sedative). °? A medicine to numb the throat area (local anesthetic) and prevent gagging. Your throat may be sprayed with this medicine, or you may gargle the medicine. °? A medicine to make you fall asleep (general anesthetic). °? A medicine to lower your risk of infection (antibiotic), inflammation (anti-inflammatory), or both. °· You will lie on your left side. °· The endoscope will be inserted through your mouth, down the back of the throat, and into the first part of the small intestine (duodenum). °· Then a small, plastic tube (cannula) will be passed through the endoscope and directed into the bile duct   or pancreatic duct. °· Dye will be injected through the cannula to make structures easier to see on an X-ray. °· X-rays will be taken to study the biliary and pancreatic passageways. You may be positioned on your abdomen or your back during the X-rays. °· A small sample of tissue (biopsy) may be removed for  examination, or other procedures may be done to fix problems that are found. °The procedure may vary among health care providers and hospitals. °What happens after the procedure? °· Your blood pressure, heart rate, breathing rate, and blood oxygen level will be monitored until the medicines you were given have worn off. °· Your throat may feel slightly sore. °· You will not be allowed to eat or drink until numbness subsides. °· Do not drive for 24 hours if you were given a sedative. °Summary °· Endoscopic retrograde cholangiopancreatogram is a procedure that may be used to diagnose or treat problems with the pancreas, bile ducts, liver, and gallbladder. °· During ERCP, other procedures may also be done to help diagnose or treat problems that are found. For example, stones can be removed, or a tissue sample can be taken out for testing (biopsy). °· Generally, this is a safe procedure. However, problems may occur, including infection, bleeding, pancreatitis, accidental damage to other structures or organs, and allergic reactions to medicines or dyes. °· The procedure may vary among health care providers and hospitals. °This information is not intended to replace advice given to you by your health care provider. Make sure you discuss any questions you have with your health care provider. °Document Released: 12/30/2000 Document Revised: 03/02/2016 Document Reviewed: 03/02/2016 °Elsevier Interactive Patient Education © 2017 Elsevier Inc. ° °

## 2018-02-03 ENCOUNTER — Other Ambulatory Visit: Payer: Self-pay

## 2018-02-03 DIAGNOSIS — K838 Other specified diseases of biliary tract: Secondary | ICD-10-CM

## 2018-02-03 LAB — CBC WITH DIFFERENTIAL/PLATELET
BASOS: 0 %
Basophils Absolute: 0 10*3/uL (ref 0.0–0.2)
EOS (ABSOLUTE): 1 10*3/uL — ABNORMAL HIGH (ref 0.0–0.4)
EOS: 7 %
HEMATOCRIT: 40.5 % (ref 34.0–46.6)
Hemoglobin: 13.6 g/dL (ref 11.1–15.9)
IMMATURE GRANULOCYTES: 0 %
Immature Grans (Abs): 0 10*3/uL (ref 0.0–0.1)
Lymphocytes Absolute: 7.5 10*3/uL — ABNORMAL HIGH (ref 0.7–3.1)
Lymphs: 58 %
MCH: 29.6 pg (ref 26.6–33.0)
MCHC: 33.6 g/dL (ref 31.5–35.7)
MCV: 88 fL (ref 79–97)
MONOS ABS: 0.8 10*3/uL (ref 0.1–0.9)
Monocytes: 6 %
NEUTROS ABS: 3.8 10*3/uL (ref 1.4–7.0)
NEUTROS PCT: 29 %
Platelets: 301 10*3/uL (ref 150–450)
RBC: 4.6 x10E6/uL (ref 3.77–5.28)
RDW: 14.1 % (ref 12.3–15.4)
WBC: 13.1 10*3/uL — ABNORMAL HIGH (ref 3.4–10.8)

## 2018-02-03 LAB — COMPREHENSIVE METABOLIC PANEL
A/G RATIO: 1.7 (ref 1.2–2.2)
ALT: 16 IU/L (ref 0–32)
AST: 19 IU/L (ref 0–40)
Albumin: 4.3 g/dL (ref 3.5–4.8)
Alkaline Phosphatase: 114 IU/L (ref 39–117)
BUN/Creatinine Ratio: 17 (ref 12–28)
BUN: 19 mg/dL (ref 8–27)
CALCIUM: 9.6 mg/dL (ref 8.7–10.3)
CO2: 27 mmol/L (ref 20–29)
Chloride: 96 mmol/L (ref 96–106)
Creatinine, Ser: 1.15 mg/dL — ABNORMAL HIGH (ref 0.57–1.00)
GFR, EST AFRICAN AMERICAN: 54 mL/min/{1.73_m2} — AB (ref >59)
GFR, EST NON AFRICAN AMERICAN: 47 mL/min/{1.73_m2} — AB (ref >59)
GLOBULIN, TOTAL: 2.6 g/dL (ref 1.5–4.5)
Glucose: 100 mg/dL — ABNORMAL HIGH (ref 65–99)
POTASSIUM: 4.7 mmol/L (ref 3.5–5.2)
SODIUM: 138 mmol/L (ref 134–144)
TOTAL PROTEIN: 6.9 g/dL (ref 6.0–8.5)

## 2018-02-04 ENCOUNTER — Encounter
Admission: RE | Disposition: A | Discharge status: Home / Self Care | Payer: Self-pay | Source: Ambulatory Visit | Attending: Gastroenterology

## 2018-02-04 ENCOUNTER — Encounter: Payer: Self-pay | Admitting: Surgery

## 2018-02-04 ENCOUNTER — Ambulatory Visit: Payer: Medicare Other | Admitting: Anesthesiology

## 2018-02-04 ENCOUNTER — Ambulatory Visit
Admission: RE | Admit: 2018-02-04 | Discharge: 2018-02-04 | Disposition: A | Discharge status: Home / Self Care | Payer: Medicare Other | Source: Ambulatory Visit | Attending: Gastroenterology | Admitting: Gastroenterology

## 2018-02-04 ENCOUNTER — Ambulatory Visit: Payer: Medicare Other

## 2018-02-04 DIAGNOSIS — M1990 Unspecified osteoarthritis, unspecified site: Secondary | ICD-10-CM | POA: Diagnosis not present

## 2018-02-04 DIAGNOSIS — K8050 Calculus of bile duct without cholangitis or cholecystitis without obstruction: Secondary | ICD-10-CM | POA: Diagnosis not present

## 2018-02-04 DIAGNOSIS — E785 Hyperlipidemia, unspecified: Secondary | ICD-10-CM | POA: Diagnosis not present

## 2018-02-04 DIAGNOSIS — K219 Gastro-esophageal reflux disease without esophagitis: Secondary | ICD-10-CM | POA: Insufficient documentation

## 2018-02-04 DIAGNOSIS — Z79899 Other long term (current) drug therapy: Secondary | ICD-10-CM | POA: Diagnosis not present

## 2018-02-04 DIAGNOSIS — I10 Essential (primary) hypertension: Secondary | ICD-10-CM | POA: Diagnosis not present

## 2018-02-04 DIAGNOSIS — Z8711 Personal history of peptic ulcer disease: Secondary | ICD-10-CM | POA: Diagnosis not present

## 2018-02-04 DIAGNOSIS — Z7982 Long term (current) use of aspirin: Secondary | ICD-10-CM | POA: Diagnosis not present

## 2018-02-04 DIAGNOSIS — K838 Other specified diseases of biliary tract: Secondary | ICD-10-CM

## 2018-02-04 DIAGNOSIS — G919 Hydrocephalus, unspecified: Secondary | ICD-10-CM | POA: Insufficient documentation

## 2018-02-04 DIAGNOSIS — E039 Hypothyroidism, unspecified: Secondary | ICD-10-CM | POA: Insufficient documentation

## 2018-02-04 DIAGNOSIS — R932 Abnormal findings on diagnostic imaging of liver and biliary tract: Secondary | ICD-10-CM | POA: Diagnosis not present

## 2018-02-04 HISTORY — PX: ERCP: SHX5425

## 2018-02-04 SURGERY — ERCP, WITH INTERVENTION IF INDICATED
Anesthesia: General

## 2018-02-04 MED ORDER — SODIUM CHLORIDE 0.9 % IV SOLN
INTRAVENOUS | Status: DC
  Administered 2018-02-04: 13:00:00 via INTRAVENOUS

## 2018-02-04 MED ORDER — PROPOFOL 500 MG/50ML IV EMUL
INTRAVENOUS | Status: DC | PRN
  Administered 2018-02-04: 175 ug/kg/min via INTRAVENOUS

## 2018-02-04 MED ORDER — LIDOCAINE HCL (PF) 1 % IJ SOLN: 2.0000 mL | Freq: Once | INTRAMUSCULAR | Status: DC

## 2018-02-04 MED ORDER — LIDOCAINE 2% (20 MG/ML) 5 ML SYRINGE
INTRAMUSCULAR | Status: DC | PRN
  Administered 2018-02-04: 50 mg via INTRAVENOUS

## 2018-02-04 MED ORDER — GLYCOPYRROLATE 0.2 MG/ML IJ SOLN
INTRAMUSCULAR | Status: DC | PRN
  Administered 2018-02-04: 0.2 mg via INTRAVENOUS

## 2018-02-04 MED ORDER — INDOMETHACIN 50 MG RE SUPP
RECTAL | Status: AC
  Administered 2018-02-04: 100 mg via RECTAL
  Filled 2018-02-04: qty 2

## 2018-02-04 MED ORDER — EPINEPHRINE PF 1 MG/10ML IJ SOSY
PREFILLED_SYRINGE | INTRAMUSCULAR | Status: AC
  Filled 2018-02-04: qty 10

## 2018-02-04 MED ORDER — EPINEPHRINE PF 1 MG/10ML IJ SOSY
PREFILLED_SYRINGE | INTRAMUSCULAR | Status: DC | PRN
  Administered 2018-02-04: 0.3 mg via INTRAVENOUS

## 2018-02-04 MED ORDER — INDOMETHACIN 50 MG RE SUPP
100.0000 mg | Freq: Once | RECTAL | Status: AC
  Administered 2018-02-04: 100 mg via RECTAL

## 2018-02-04 MED ORDER — PROPOFOL 10 MG/ML IV BOLUS
INTRAVENOUS | Status: DC | PRN
  Administered 2018-02-04: 30 mg via INTRAVENOUS
  Administered 2018-02-04: 70 mg via INTRAVENOUS

## 2018-02-04 MED ORDER — LIDOCAINE HCL (PF) 1 % IJ SOLN
INTRAMUSCULAR | Status: AC
  Filled 2018-02-04: qty 2

## 2018-02-04 NOTE — Transfer of Care (Signed)
Immediate Anesthesia Transfer of Care Note  [name]  Procedure(s) Performed: ENDOSCOPIC RETROGRADE CHOLANGIOPANCREATOGRAPHY (ERCP) (N/A )  Patient Location: Endoscopy Unit  Anesthesia Type:General  Level of Consciousness: awake, alert  and oriented  Airway & Oxygen Therapy: Patient connected to nasal cannula oxygen  Post-op Assessment: Post -op Vital signs reviewed and stable  Post vital signs: stable  Last Vitals:  Vitals Value Taken Time  BP    Temp    Pulse 66 02/04/2018  2:39 PM  Resp 15 02/04/2018  2:39 PM  SpO2 96 % 02/04/2018  2:39 PM  Vitals shown include unvalidated device data.  Last Pain:  Vitals:   02/04/18 1148  TempSrc: Tympanic  PainSc: 0-No pain         Complications: No apparent anesthesia complications

## 2018-02-04 NOTE — Anesthesia Post-op Follow-up Note (Signed)
Anesthesia QCDR form completed.        

## 2018-02-04 NOTE — H&P (Signed)
Lucilla Lame, MD [facility] [address] [place], [place] [address] Phone:[phone] Fax : [phone]  Primary Care Physician:  Mar Daring, PA-C Primary Gastroenterologist:  Dr. Allen Norris  Pre-Procedure History & Physical: HPI:  Cathy Barnes is a 77 y.o. female is here for an ERCP.   Past Medical History:  Diagnosis Date  . Allergy    some  . Cataract    bilaterally both eyes   . GERD (gastroesophageal reflux disease)   . Hydrocephalus ([place])    [facility]   . Hyperglycemia   . Hyperlipidemia   . Hypertension   . Lymphocytosis    monoclonal b cell  . Migraine, unspecified, without mention of intractable migraine without mention of status migrainosus 12/14/2012  . Osteoarthritis, chronic   . Osteoporosis   . Peptic ulcer    some bleeding with ulcers as well     Past Surgical History:  Procedure Laterality Date  . BREAST CYST ASPIRATION Left 1993   Removed  . breast cyst removed Right    benign   . COLONOSCOPY    . growth removal Left 2019   L wrist growth removed, abnormal cells  . History of Cataract surgery Right 2011   left 2011  . PARTIAL HYSTERECTOMY    . POLYPECTOMY    . S/P ACL repair Left    knee  . TUBAL LIGATION  1975   Bilateral    Prior to Admission medications   Medication Sig Start Date End Date Taking? Authorizing Provider  ALPRAZolam Duanne Moron) 0.25 MG tablet TAKE 2 TABLETS BY MOUTH DAILY 01/24/18   Mar Daring, PA-C  aspirin 81 MG tablet Take 81 mg by mouth daily.    [provider]  atenolol (TENORMIN) 50 MG tablet TAKE 1 TABLET BY MOUTH ONCE DAILY 01/17/18   Mar Daring, PA-C  Cholecalciferol 50000 units capsule take 1 capsule by mouth every 4 weeks 04/15/17   Mar Daring, PA-C  cyclobenzaprine (FLEXERIL) 10 MG tablet TAKE 1 TABLET(10 MG) BY MOUTH AT BEDTIME 01/24/18   [name], MD  donepezil (ARICEPT) 10 MG tablet Take 1 tablet (10 mg total) by mouth at bedtime. 07/05/17   [name], MD  ezetimibe (ZETIA) 10 MG tablet TAKE 1 TABLET BY MOUTH AT BEDTIME 01/24/18   Mar Daring, PA-C  gabapentin (NEURONTIN) 100 MG capsule TAKE 2 CAPSULES BY MOUTH EVERY MORING AND 2 AT LUNCH 12/15/17   [name], MD  gabapentin (NEURONTIN) 300 MG capsule TAKE 1 CAPSULE BY MOUTH AT BEDTIME 11/29/17   [name], MD  gabapentin (NEURONTIN) 300 MG capsule Take one 300 mg capsule at morning and lunch. Take two 300 mg capsule at bedtime. 02/02/18   Pabon, [place], MD  HYDROcodone-Acetaminophen 5-300 MG TABS Take 1 tablet by mouth 2 (two) times daily as needed. 09/24/17   Mar Daring, PA-C  levothyroxine (SYNTHROID, LEVOTHROID) 100 MCG tablet Take 1 tablet (100 mcg total) by mouth daily. 09/27/17   Mar Daring, PA-C  losartan (COZAAR) 50 MG tablet TAKE 1 TABLET BY MOUTH AT BEDTIME 12/28/17   Mar Daring, PA-C  Magnesium 250 MG TABS Take 1 tablet by mouth daily.    [provider]  ondansetron (ZOFRAN) 4 MG tablet TAKE 1 TABLET(4 MG) BY MOUTH EVERY 8 HOURS AS NEEDED 01/21/18   Mar Daring, PA-C  potassium chloride SA (K-DUR,KLOR-CON) 20 MEQ tablet take 1 tablet by mouth twice a day 03/09/17  Fenton Malling M, PA-C  triamterene-hydrochlorothiazide (MAXZIDE) 75-50 MG tablet TAKE 1/2 TABLET BY MOUTH DAILY 11/17/17   Mar Daring, PA-C  verapamil (VERELAN PM) 120 MG 24 hr capsule TAKE 1 CAPSULE BY MOUTH ONCE DAILY 12/15/17   [name], MD  VESICARE 10 MG tablet take 1 tablet by mouth once daily 01/06/17   Mar Daring, PA-C  zolpidem (AMBIEN) 10 MG tablet TAKE 1 TABLET BY MOUTH EVERY DAY AT BEDTIME 11/29/17   Mar Daring, PA-C    Allergies as of 02/03/2018 - Review Complete 02/02/2018  Allergen Reaction Noted  . Shellfish allergy  12/17/2014  . Latex Rash 07/20/2016    Family History  Problem Relation Age of Onset  . Colon cancer Mother   . Hypertension Mother   . Colon cancer Father   . Heart  disease Father   . Hypertension Father   . Colon polyps Neg Hx   . Rectal cancer Neg Hx   . Stomach cancer Neg Hx     Social History   Socioeconomic History  . Marital status: Divorced    Spouse name: Not on file  . Number of children: 1  . Years of education: college  . Highest education level: Not on file  Occupational History  . Occupation: book Production designer, theatre/television/film: OTHER    Comment: Paediatric nurse  Social Needs  . Financial resource strain: Not on file  . Food insecurity:    Worry: Not on file    Inability: Not on file  . Transportation needs:    Medical: Not on file    Non-medical: Not on file  Tobacco Use  . Smoking status: Never Smoker  . Smokeless tobacco: Never Used  Substance and Sexual Activity  . Alcohol use: Yes    Alcohol/week: 1.0 standard drinks    Types: 1 Glasses of wine per week    Comment: Rare- wine on occasion  . Drug use: No  . Sexual activity: Not on file  Lifestyle  . Physical activity:    Days per week: Not on file    Minutes per session: Not on file  . Stress: Not on file  Relationships  . Social connections:    Talks on phone: Not on file    Gets together: Not on file    Attends religious service: Not on file    Active member of club or organization: Not on file    Attends meetings of clubs or organizations: Not on file    Relationship status: Not on file  . Intimate partner violence:    Fear of current or ex partner: Not on file    Emotionally abused: Not on file    Physically abused: Not on file    Forced sexual activity: Not on file  Other Topics Concern  . Not on file  Social History Narrative   Patient is right handed, resides alone. Divorced.    Review of Systems: See HPI, otherwise negative ROS  Physical Exam: BP 134/68   Pulse 61   Temp (!) 96.8 F (36 C) (Tympanic)   Resp 17   Ht 5\' 5"  (1.651 m)   Wt 88.3 kg   SpO2 94%   BMI 32.39 kg/m  General:   Alert,  pleasant and cooperative in NAD Head:   Normocephalic and atraumatic. Neck:  Supple; no masses or thyromegaly. Lungs:  Clear throughout to auscultation.    Heart:  Regular rate and rhythm. Abdomen:  Soft, nontender  and nondistended. Normal bowel sounds, without guarding, and without rebound.   Neurologic:  Alert and  oriented x4;  grossly normal neurologically.  Impression/Plan: Cathy Barnes is here for an ERCP to be performed for CBD stones  Risks, benefits, limitations, and alternatives regarding  ERCP have been reviewed with the patient.  Questions have been answered.  All parties agreeable.   Lucilla Lame, MD  02/04/2018, 1:21 PM

## 2018-02-04 NOTE — Progress Notes (Addendum)
Patient ID: Cathy Barnes, female   DOB: [birth year], 77 y.o.   MRN: [id number]  HPI DEZARIA METHOT is a 77 y.o. female seen in consultation at the request of [facility], a history of choledocholithiasis and cholelithiasis.  Reports that she has been having right upper quadrant pain and right chest wall pain for the last 4 to 6 weeks.  She reports that the pain is moderate intensity colicky and dull in nature.  She reports some nausea but no fevers no chills and no vomiting.  No evidence of significant biliary obstruction.  No jaundice and no change in the color of the stools.  Does feel some bloating and she has had decreased appetite.  She is able to perform more than 4 METS of activity without any shortness of the chest pain. The work-up included a CT and MRCP that I have personally review showing evidence of choledocholithiasis as well as cholelithiasis with intrahepatic biliary tree dilation.  ABC reveals a slightly elevated white count of 13,000.  Retinae is 1.0 She does Have a history of partial hysterectomy and tubal ligation.  HPI  Past Medical History:  Diagnosis Date  . Allergy    some  . Cataract    bilaterally both eyes   . GERD (gastroesophageal reflux disease)   . Hydrocephalus ([place])    [facility]   . Hyperglycemia   . Hyperlipidemia   . Hypertension   . Lymphocytosis    monoclonal b cell  . Migraine, unspecified, without mention of intractable migraine without mention of status migrainosus 12/14/2012  . Osteoarthritis, chronic   . Osteoporosis   . Peptic ulcer    some bleeding with ulcers as well     Past Surgical History:  Procedure Laterality Date  . BREAST CYST ASPIRATION Left 1993   Removed  . breast cyst removed Right    benign   . COLONOSCOPY    . growth removal Left 2019   L wrist growth removed, abnormal cells  . History of Cataract surgery Right 2011   left 2011  . PARTIAL HYSTERECTOMY    . POLYPECTOMY    . S/P ACL repair Left    knee  .  TUBAL LIGATION  1975   Bilateral    Family History  Problem Relation Age of Onset  . Colon cancer Mother   . Hypertension Mother   . Colon cancer Father   . Heart disease Father   . Hypertension Father   . Colon polyps Neg Hx   . Rectal cancer Neg Hx   . Stomach cancer Neg Hx     Social History Social History   Tobacco Use  . Smoking status: Never Smoker  . Smokeless tobacco: Never Used  Substance Use Topics  . Alcohol use: Yes    Alcohol/week: 1.0 standard drinks    Types: 1 Glasses of wine per week    Comment: Rare- wine on occasion  . Drug use: No    Allergies  Allergen Reactions  . Shellfish Allergy     Throat closes, rash  . Latex Rash    Tapes,adhesives    Current Outpatient Medications  Medication Sig Dispense Refill  . ALPRAZolam (XANAX) 0.25 MG tablet TAKE 2 TABLETS BY MOUTH DAILY 60 tablet 0  . aspirin 81 MG tablet Take 81 mg by mouth daily.    [name] atenolol (TENORMIN) 50 MG tablet TAKE 1 TABLET BY MOUTH ONCE DAILY 90 tablet 1  . Cholecalciferol 50000 units capsule take 1  capsule by mouth every 4 weeks 4 capsule 5  . cyclobenzaprine (FLEXERIL) 10 MG tablet TAKE 1 TABLET(10 MG) BY MOUTH AT BEDTIME 30 tablet 5  . donepezil (ARICEPT) 10 MG tablet Take 1 tablet (10 mg total) by mouth at bedtime. 90 tablet 6  . ezetimibe (ZETIA) 10 MG tablet TAKE 1 TABLET BY MOUTH AT BEDTIME 30 tablet 5  . gabapentin (NEURONTIN) 100 MG capsule TAKE 2 CAPSULES BY MOUTH EVERY MORING AND 2 AT LUNCH 360 capsule 0  . gabapentin (NEURONTIN) 300 MG capsule TAKE 1 CAPSULE BY MOUTH AT BEDTIME 90 capsule 0  . HYDROcodone-Acetaminophen 5-300 MG TABS Take 1 tablet by mouth 2 (two) times daily as needed. 60 each 0  . levothyroxine (SYNTHROID, LEVOTHROID) 100 MCG tablet Take 1 tablet (100 mcg total) by mouth daily. 90 tablet 3  . losartan (COZAAR) 50 MG tablet TAKE 1 TABLET BY MOUTH AT BEDTIME 90 tablet 1  . Magnesium 250 MG TABS Take 1 tablet by mouth daily.    . ondansetron (ZOFRAN) 4 MG  tablet TAKE 1 TABLET(4 MG) BY MOUTH EVERY 8 HOURS AS NEEDED 20 tablet 1  . potassium chloride SA (K-DUR,KLOR-CON) 20 MEQ tablet take 1 tablet by mouth twice a day 180 tablet 1  . triamterene-hydrochlorothiazide (MAXZIDE) 75-50 MG tablet TAKE 1/2 TABLET BY MOUTH DAILY 90 tablet 1  . verapamil (VERELAN PM) 120 MG 24 hr capsule TAKE 1 CAPSULE BY MOUTH ONCE DAILY 90 capsule 0  . VESICARE 10 MG tablet take 1 tablet by mouth once daily 90 tablet 4  . zolpidem (AMBIEN) 10 MG tablet TAKE 1 TABLET BY MOUTH EVERY DAY AT BEDTIME 90 tablet 1  . gabapentin (NEURONTIN) 300 MG capsule Take one 300 mg capsule at morning and lunch. Take two 300 mg capsule at bedtime. 120 capsule 0   No current facility-administered medications for this visit.      Review of Systems Full ROS  was asked and was negative except for the information on the HPI  Physical Exam Blood pressure (!) 146/73, pulse 64, temperature 97.7 F (36.5 C), temperature source Skin, height 5' 5.5" (1.664 m), weight 194 lb 9.6 oz (88.3 kg). CONSTITUTIONAL: NAD EYES: Pupils are equal, round, and reactive to light, Sclera are non-icteric. EARS, NOSE, MOUTH AND THROAT: The oropharynx is clear. The oral mucosa is pink and moist. Hearing is intact to voice. LYMPH NODES:  Lymph nodes in the neck are normal. RESPIRATORY:  Lungs are clear. There is normal respiratory effort, with equal breath sounds bilaterally, and without pathologic use of accessory muscles. CARDIOVASCULAR: Heart is regular without murmurs, gallops, or rubs. GI: The abdomen is  Soft,mild TTP RUQ and Right chest wall, no peritonitis or Murphy sign.. There are no palpable masses. There is no hepatosplenomegaly. There are normal bowel sounds . GU: Rectal deferred.   MUSCULOSKELETAL: Normal muscle strength and tone. No cyanosis or edema.   SKIN: Turgor is good and there are no pathologic skin lesions or ulcers. NEUROLOGIC: Motor and sensation is grossly normal. Cranial nerves are grossly  intact. PSYCH:  Oriented to person, place and time. Affect is normal.  Data Reviewed  I have personally reviewed the patient's imaging, laboratory findings and medical records.    Assessment/Plan 77 year old female with symptomatic cholelithiasis and choledocholithiasis seen on MRCP.  First order of business is to clear her common bile duct.  We will request an ERCP to be performed by GI.  I will also obtain a recent  CBC and a CMP.  He is not toxic does not have acute cholangitis and does not have evidence of a complete biliary obstruction. Had discussed with the patient in detail about my thought process and the need for ERCP and subsequently the need for cholecystectomy. I will see her back in a couple weeks after she completes her ERCP to schedule her for a laparoscopic cholecystectomy.  We did discuss the need for cholecystectomy given the fact that she is symptomatic and she had an episode of choledocholithiasis.  She understands and agrees with the plan. I also think that her clinical picture has a degree of intercostal neuritis since her chest wall is tender.  We Will prescribe gabapentin for this Time spent with the patient was 45 minutes, with more than 50% of the time spent in face-to-face education, counseling and care coordination.     Caroleen Hamman, MD FACS General Surgeon 02/04/2018, 10:18 AM

## 2018-02-04 NOTE — Op Note (Addendum)
[facility] Gastroenterology Patient Name: Cathy Barnes Procedure Date: 02/04/2018 12:19 PM MRN: [id number] Account #: [id number] Date of Birth: 1940-09-23 Admit Type: Inpatient Age: 77 Room: [facility] ENDO ROOM 4 Gender: Female Note Status: Finalized Procedure:            ERCP Indications:          Abnormal MRCP, Bile duct stone(s) Providers:            Lucilla Lame MD, MD Referring MD:         Mar Daring (Referring MD) Medicines:            Propofol per Anesthesia Complications:        Hemorrhage Procedure:            Pre-Anesthesia Assessment:                       - Prior to the procedure, a History and Physical was                        performed, and patient medications and allergies were                        reviewed. The patient's tolerance of previous                        anesthesia was also reviewed. The risks and benefits of                        the procedure and the sedation options and risks were                        discussed with the patient. All questions were                        answered, and informed consent was obtained. Prior                        Anticoagulants: The patient has taken no previous                        anticoagulant or antiplatelet agents. ASA Grade                        Assessment: II - A patient with mild systemic disease.                        After reviewing the risks and benefits, the patient was                        deemed in satisfactory condition to undergo the                        procedure.                       After obtaining informed consent, the scope was passed                        under direct vision. Throughout the procedure, the  patient's blood pressure, pulse, and oxygen saturations                        were monitored continuously. The Duodenoscope was                        introduced through the mouth, and used to inject                        contrast  into and used to inject contrast into the                        dorsal and ventral pancreatic ducts. The ERCP was                        accomplished without difficulty. The patient tolerated                        the procedure well. Findings:      The scout film was normal. The esophagus was successfully intubated       under direct vision. The scope was advanced to a normal major papilla in       the descending duodenum without detailed examination of the pharynx,       larynx and associated structures, and upper GI tract. The upper GI tract       was grossly normal. The bile duct was deeply cannulated with the       short-nosed traction sphincterotome. Contrast was injected. I personally       interpreted the bile duct images. There was brisk flow of contrast       through the ducts. Image quality was excellent. Contrast extended to the       entire biliary tree. The lower third of the main bile duct contained       filling defect(s) thought to be a stone. A wire was passed into the       biliary tree. A 5 mm biliary sphincterotomy was made with a traction       (standard) sphincterotome using ERBE electrocautery. Moderate bleeding       from the sphincterotomy stopped within 5 minutes. The biliary tree was       swept with a 15 mm balloon starting at the bifurcation. One 10 Fr by 5       cm plastic stent with a single external flap and a single internal flap       was placed into the common bile duct. Bile flowed through the stent. The       stent was in good position. Coagulation for hemostasis in the ampulla       using bipolar probe through the ERCP scope was successful. Impression:           - Hemostasis in the ampulla with bipolar cautery was                        performed.                       - A filling defect consistent with a stone was seen on                        the cholangiogram.                       -  The examination was suspicious for                         choledocholithiasis. Complete removal was accomplished                        by biliary sphincterotomy and balloon extraction.                       - A biliary sphincterotomy was performed.                       - The biliary tree was swept.                       - One plastic stent was placed into the common bile                        duct. Recommendation:       - Discharge patient to home.                       - Clear liquid diet today.                       - Watch for pancreatitis, bleeding, perforation, and                        cholangitis.                       - If any black stools then go to the ER.                       - Repeat ERCP in 2 months to remove stent. Procedure Code(s):    --- Professional ---                       [phone], Endoscopic retrograde cholangiopancreatography                        (ERCP); with placement of endoscopic stent into biliary                        or pancreatic duct, including pre- and post-dilation                        and guide wire passage, when performed, including                        sphincterotomy, when performed, each stent                       [id number], Endoscopic catheterization of the biliary ductal                        system, radiological supervision and interpretation                       [phone], Unlisted procedure, biliary tract Diagnosis Code(s):    --- Professional ---                       R93.2, Abnormal findings  on diagnostic imaging of liver                        and biliary tract                       K80.50, Calculus of bile duct without cholangitis or                        cholecystitis without obstruction CPT copyright 2018 American Medical Association. All rights reserved. The codes documented in this report are preliminary and upon coder review may  be revised to meet current compliance requirements. Lucilla Lame MD, MD 02/04/2018 2:43:32 PM This report has been signed electronically. Number of Addenda:  0 Note Initiated On: 02/04/2018 12:19 PM      [facility]

## 2018-02-04 NOTE — Anesthesia Preprocedure Evaluation (Addendum)
Anesthesia Evaluation  Patient identified by MRN, date of birth, ID band Patient awake    Reviewed: Allergy & Precautions, H&P , NPO status , Patient's Chart, lab work & pertinent test results  Airway Mallampati: III   Neck ROM: full    Dental  (+) Teeth Intact   Pulmonary neg pulmonary ROS,    breath sounds clear to auscultation       Cardiovascular hypertension, negative cardio ROS   Rhythm:regular Rate:Normal     Neuro/Psych  Headaches, negative psych ROS   GI/Hepatic Neg liver ROS, PUD, GERD  ,  Endo/Other  Hypothyroidism   Renal/GU negative Renal ROS  negative genitourinary   Musculoskeletal  (+) Arthritis ,   Abdominal   Peds  Hematology negative hematology ROS (+)   Anesthesia Other Findings Past Medical History: No date: Allergy     Comment:  some No date: Cataract     Comment:  bilaterally both eyes  No date: GERD (gastroesophageal reflux disease) No date: Hydrocephalus ([place])     Comment:  [facility]  No date: Hyperglycemia No date: Hyperlipidemia No date: Hypertension No date: Lymphocytosis     Comment:  monoclonal b cell 12/14/2012: Migraine, unspecified, without mention of intractable  migraine without mention of status migrainosus No date: Osteoarthritis, chronic No date: Osteoporosis No date: Peptic ulcer     Comment:  some bleeding with ulcers as well   Past Surgical History: 1993: BREAST CYST ASPIRATION; Left     Comment:  Removed No date: breast cyst removed; Right     Comment:  benign  No date: COLONOSCOPY 2019: growth removal; Left     Comment:  L wrist growth removed, abnormal cells 2011: History of Cataract surgery; Right     Comment:  left 2011 No date: PARTIAL HYSTERECTOMY No date: POLYPECTOMY No date: S/P ACL repair; Left     Comment:  knee 1975: TUBAL LIGATION     Comment:  Bilateral  BMI    Body Mass Index:  32.39 kg/m      Reproductive/Obstetrics negative  OB ROS                            Anesthesia Physical Anesthesia Plan  ASA: III  Anesthesia Plan: General   Post-op Pain Management:    Induction:   PONV Risk Score and Plan: Propofol infusion and TIVA  Airway Management Planned:   Additional Equipment:   Intra-op Plan:   Post-operative Plan:   Informed Consent: I have reviewed the patients History and Physical, chart, labs and discussed the procedure including the risks, benefits and alternatives for the proposed anesthesia with the patient or authorized representative who has indicated his/her understanding and acceptance.   Dental Advisory Given  Plan Discussed with: Anesthesiologist, CRNA and Surgeon  Anesthesia Plan Comments:         Anesthesia Quick Evaluation

## 2018-02-04 NOTE — Anesthesia Postprocedure Evaluation (Signed)
Anesthesia Post Note  Patient: Derinda Late  Procedure(s) Performed: ENDOSCOPIC RETROGRADE CHOLANGIOPANCREATOGRAPHY (ERCP) (N/A )  Patient location during evaluation: Endoscopy Anesthesia Type: General Level of consciousness: awake and alert Pain management: pain level controlled Vital Signs Assessment: post-procedure vital signs reviewed and stable Respiratory status: spontaneous breathing, nonlabored ventilation, respiratory function stable and patient connected to nasal cannula oxygen Cardiovascular status: blood pressure returned to baseline and stable Postop Assessment: no apparent nausea or vomiting Anesthetic complications: no     Last Vitals:  Vitals:   02/04/18 1450 02/04/18 1500  BP:  (!) 159/74  Pulse:    Resp: 16 16  Temp:    SpO2:      Last Pain:  Vitals:   02/04/18 1500  TempSrc:   PainSc: 0-No pain                 Destan Franchini S

## 2018-02-07 ENCOUNTER — Encounter: Payer: Self-pay | Admitting: Gastroenterology

## 2018-02-14 ENCOUNTER — Telehealth: Payer: Self-pay | Admitting: Surgery

## 2018-02-14 NOTE — Telephone Encounter (Signed)
Patient is calling and just has some questions about somethings with what her next step will be. Patient can be reached at [phone]. Please call patient and advise.

## 2018-02-15 NOTE — Telephone Encounter (Signed)
Called patient back to let her know that she will need to come back to see Dr. Dahlia Byes to discuss her ERCP results and to what her next step will be. Patient agreed and had no further questions. Patient is scheduled to come in on 02/21/2018 to see Dr. Dahlia Byes.

## 2018-02-16 ENCOUNTER — Ambulatory Visit: Payer: Medicare Other | Admitting: Gastroenterology

## 2018-02-16 ENCOUNTER — Other Ambulatory Visit: Payer: Self-pay

## 2018-02-16 ENCOUNTER — Encounter: Payer: Self-pay | Admitting: Gastroenterology

## 2018-02-16 VITALS — BP 160/68 | HR 57 | Ht 65.0 in | Wt 194.0 lb

## 2018-02-16 DIAGNOSIS — K8050 Calculus of bile duct without cholangitis or cholecystitis without obstruction: Secondary | ICD-10-CM

## 2018-02-16 NOTE — H&P (View-Only) (Signed)
Primary Care Physician: Cathy Daring, PA-C  Primary Gastroenterologist:  Dr. Lucilla Barnes  Chief Complaint  Patient presents with  . Follow up ERCP    HPI: Cathy Barnes is a 77 y.o. female here for follow-up after having an ERCP with stent placement.  The patient had choledocholithiasis that was remedied by stone extraction.  The patient then had some post enterotomy bleeding and a stent was placed.  The patient reports that her abdominal pain in the right side is still present despite have the ERCP.  The patient has not had her gallbladder out.  She has been told prior to having the ERCP that removal of the stone was not to fix her gallbladder but was to facilitate drainage of the common bile duct and avoid any stone causing impaction.  Current Outpatient Medications  Medication Sig Dispense Refill  . ALPRAZolam (XANAX) 0.25 MG tablet TAKE 2 TABLETS BY MOUTH DAILY 60 tablet 0  . aspirin 81 MG tablet Take 81 mg by mouth daily.    [name] atenolol (TENORMIN) 50 MG tablet TAKE 1 TABLET BY MOUTH ONCE DAILY 90 tablet 1  . Cholecalciferol 50000 units capsule take 1 capsule by mouth every 4 weeks 4 capsule 5  . cyclobenzaprine (FLEXERIL) 10 MG tablet TAKE 1 TABLET(10 MG) BY MOUTH AT BEDTIME 30 tablet 5  . donepezil (ARICEPT) 10 MG tablet Take 1 tablet (10 mg total) by mouth at bedtime. 90 tablet 6  . ezetimibe (ZETIA) 10 MG tablet TAKE 1 TABLET BY MOUTH AT BEDTIME 30 tablet 5  . gabapentin (NEURONTIN) 100 MG capsule TAKE 2 CAPSULES BY MOUTH EVERY MORING AND 2 AT LUNCH 360 capsule 0  . gabapentin (NEURONTIN) 300 MG capsule Take one 300 mg capsule at morning and lunch. Take two 300 mg capsule at bedtime. 120 capsule 0  . HYDROcodone-Acetaminophen 5-300 MG TABS Take 1 tablet by mouth 2 (two) times daily as needed. 60 each 0  . levothyroxine (SYNTHROID, LEVOTHROID) 100 MCG tablet Take 1 tablet (100 mcg total) by mouth daily. 90 tablet 3  . losartan (COZAAR) 50 MG tablet TAKE 1 TABLET BY MOUTH  AT BEDTIME 90 tablet 1  . Magnesium 250 MG TABS Take 1 tablet by mouth daily.    . ondansetron (ZOFRAN) 4 MG tablet TAKE 1 TABLET(4 MG) BY MOUTH EVERY 8 HOURS AS NEEDED 20 tablet 1  . potassium chloride SA (K-DUR,KLOR-CON) 20 MEQ tablet take 1 tablet by mouth twice a day 180 tablet 1  . triamterene-hydrochlorothiazide (MAXZIDE) 75-50 MG tablet TAKE 1/2 TABLET BY MOUTH DAILY 90 tablet 1  . verapamil (VERELAN PM) 120 MG 24 hr capsule TAKE 1 CAPSULE BY MOUTH ONCE DAILY 90 capsule 0  . VESICARE 10 MG tablet take 1 tablet by mouth once daily 90 tablet 4  . zolpidem (AMBIEN) 10 MG tablet TAKE 1 TABLET BY MOUTH EVERY DAY AT BEDTIME 90 tablet 1   No current facility-administered medications for this visit.     Allergies as of 02/16/2018 - Review Complete 02/16/2018  Allergen Reaction Noted  . Shellfish allergy  12/17/2014  . Latex Rash 07/20/2016    ROS:  General: Negative for anorexia, weight loss, fever, chills, fatigue, weakness. ENT: Negative for hoarseness, difficulty swallowing , nasal congestion. CV: Negative for chest pain, angina, palpitations, dyspnea on exertion, peripheral edema.  Respiratory: Negative for dyspnea at rest, dyspnea on exertion, cough, sputum, wheezing.  GI: See history of present illness. GU:  Negative for dysuria, hematuria, urinary incontinence, urinary  frequency, nocturnal urination.  Endo: Negative for unusual weight change.    Physical Examination:   BP (!) 160/68   Pulse (!) 57   Ht 5\' 5"  (1.651 m)   Wt 194 lb (88 kg)   BMI 32.28 kg/m   General: Well-nourished, well-developed in no acute distress.  Eyes: No icterus. Conjunctivae pink. Extremities: No lower extremity edema. No clubbing or deformities. Neuro: Alert and oriented x 3.  Grossly intact. Skin: Warm and dry, no jaundice.   Psych: Alert and cooperative, normal mood and affect.  Labs:    Imaging Studies: Mr Abdomen Mrcp Wo Contrast  Result Date: 01/31/2018 CLINICAL DATA:  Biliary  dilatation on recent CT. EXAM: MRI ABDOMEN WITHOUT CONTRAST  (INCLUDING MRCP) TECHNIQUE: Multiplanar multisequence MR imaging of the abdomen was performed. Heavily T2-weighted images of the biliary and pancreatic ducts were obtained, and three-dimensional MRCP images were rendered by post processing. COMPARISON:  Abdomen/pelvis CT 01/12/2018 FINDINGS: Lower chest: Unremarkable. Hepatobiliary: No focal abnormality in the liver on this study without intravenous contrast. Gallstones measuring up to about 11 mm are noted in the gallbladder. No gallbladder wall thickening or pericholecystic fluid. Intra and extrahepatic biliary duct dilatation is evident. Extrahepatic common duct measures 8 mm diameter. Common bile duct in the head of the pancreas measures 10 mm. In the distal common bile duct, tiny adjacent common bile duct stones measuring 2-3 mm are evident (images 22 and 23/series 8 and image 24/series 12). Pancreas: No focal mass lesion. No dilatation of the main duct. No intraparenchymal cyst. No peripancreatic edema. Spleen:  No splenomegaly. No focal mass lesion. Adrenals/Urinary Tract: No adrenal nodule or mass. 6 mm probable cyst noted upper pole right kidney with 2.1 cm probable cyst noted in the interpolar left kidney Stomach/Bowel: Stomach is nondistended. No gastric wall thickening. No evidence of outlet obstruction. Duodenum is normally positioned as is the ligament of Treitz. No small bowel or colonic dilatation within the visualized abdomen. Vascular/Lymphatic: No abdominal aortic aneurysm. No evidence for abdominal lymphadenopathy Other:  No intraperitoneal free fluid. Musculoskeletal: No abnormal marrow signal within the visualized bony anatomy. IMPRESSION: 1. Cholelithiasis with biliary dilatation. Common bile duct measures up to 10 mm with 2 adjacent tiny distal common bile duct stones evident. Electronically Signed   By: Misty Stanley M.D.   On: 01/31/2018 15:19   Dg C-arm 1-60 Min-no  Report  Result Date: 02/04/2018 Fluoroscopy was utilized by the requesting physician.  No radiographic interpretation.    Assessment and Plan:   Cathy Barnes is a 77 y.o. y/o female who comes in today for follow-up after having ERCP.  The patient still has right-sided abdominal pain.  The patient has a stent placed due to bleeding after sphincterotomy.  The patient will have a repeat ERCP in the next few weeks to remove the stent and should follow-up with surgery for cholecystectomy.  Patient has been spent the plan and agrees with it.    Cathy Lame, MD. Mar[facility] Regal   Note: This dictation was prepared with Dragon dictation along with smaller phrase technology. Any transcriptional errors that result from this process are unintentional.

## 2018-02-16 NOTE — Progress Notes (Signed)
Primary Care Physician: Mar Daring, PA-C  Primary Gastroenterologist:  Dr. Lucilla Lame  Chief Complaint  Patient presents with  . Follow up ERCP    HPI: Cathy Barnes is a 77 y.o. female here for follow-up after having an ERCP with stent placement.  The patient had choledocholithiasis that was remedied by stone extraction.  The patient then had some post enterotomy bleeding and a stent was placed.  The patient reports that her abdominal pain in the right side is still present despite have the ERCP.  The patient has not had her gallbladder out.  She has been told prior to having the ERCP that removal of the stone was not to fix her gallbladder but was to facilitate drainage of the common bile duct and avoid any stone causing impaction.  Current Outpatient Medications  Medication Sig Dispense Refill  . ALPRAZolam (XANAX) 0.25 MG tablet TAKE 2 TABLETS BY MOUTH DAILY 60 tablet 0  . aspirin 81 MG tablet Take 81 mg by mouth daily.    [name] atenolol (TENORMIN) 50 MG tablet TAKE 1 TABLET BY MOUTH ONCE DAILY 90 tablet 1  . Cholecalciferol 50000 units capsule take 1 capsule by mouth every 4 weeks 4 capsule 5  . cyclobenzaprine (FLEXERIL) 10 MG tablet TAKE 1 TABLET(10 MG) BY MOUTH AT BEDTIME 30 tablet 5  . donepezil (ARICEPT) 10 MG tablet Take 1 tablet (10 mg total) by mouth at bedtime. 90 tablet 6  . ezetimibe (ZETIA) 10 MG tablet TAKE 1 TABLET BY MOUTH AT BEDTIME 30 tablet 5  . gabapentin (NEURONTIN) 100 MG capsule TAKE 2 CAPSULES BY MOUTH EVERY MORING AND 2 AT LUNCH 360 capsule 0  . gabapentin (NEURONTIN) 300 MG capsule Take one 300 mg capsule at morning and lunch. Take two 300 mg capsule at bedtime. 120 capsule 0  . HYDROcodone-Acetaminophen 5-300 MG TABS Take 1 tablet by mouth 2 (two) times daily as needed. 60 each 0  . levothyroxine (SYNTHROID, LEVOTHROID) 100 MCG tablet Take 1 tablet (100 mcg total) by mouth daily. 90 tablet 3  . losartan (COZAAR) 50 MG tablet TAKE 1 TABLET BY MOUTH  AT BEDTIME 90 tablet 1  . Magnesium 250 MG TABS Take 1 tablet by mouth daily.    . ondansetron (ZOFRAN) 4 MG tablet TAKE 1 TABLET(4 MG) BY MOUTH EVERY 8 HOURS AS NEEDED 20 tablet 1  . potassium chloride SA (K-DUR,KLOR-CON) 20 MEQ tablet take 1 tablet by mouth twice a day 180 tablet 1  . triamterene-hydrochlorothiazide (MAXZIDE) 75-50 MG tablet TAKE 1/2 TABLET BY MOUTH DAILY 90 tablet 1  . verapamil (VERELAN PM) 120 MG 24 hr capsule TAKE 1 CAPSULE BY MOUTH ONCE DAILY 90 capsule 0  . VESICARE 10 MG tablet take 1 tablet by mouth once daily 90 tablet 4  . zolpidem (AMBIEN) 10 MG tablet TAKE 1 TABLET BY MOUTH EVERY DAY AT BEDTIME 90 tablet 1   No current facility-administered medications for this visit.     Allergies as of 02/16/2018 - Review Complete 02/16/2018  Allergen Reaction Noted  . Shellfish allergy  12/17/2014  . Latex Rash 07/20/2016    ROS:  General: Negative for anorexia, weight loss, fever, chills, fatigue, weakness. ENT: Negative for hoarseness, difficulty swallowing , nasal congestion. CV: Negative for chest pain, angina, palpitations, dyspnea on exertion, peripheral edema.  Respiratory: Negative for dyspnea at rest, dyspnea on exertion, cough, sputum, wheezing.  GI: See history of present illness. GU:  Negative for dysuria, hematuria, urinary incontinence, urinary  frequency, nocturnal urination.  Endo: Negative for unusual weight change.    Physical Examination:   BP (!) 160/68   Pulse (!) 57   Ht 5\' 5"  (1.651 m)   Wt 194 lb (88 kg)   BMI 32.28 kg/m   General: Well-nourished, well-developed in no acute distress.  Eyes: No icterus. Conjunctivae pink. Extremities: No lower extremity edema. No clubbing or deformities. Neuro: Alert and oriented x 3.  Grossly intact. Skin: Warm and dry, no jaundice.   Psych: Alert and cooperative, normal mood and affect.  Labs:    Imaging Studies: Mr Abdomen Mrcp Wo Contrast  Result Date: 01/31/2018 CLINICAL DATA:  Biliary  dilatation on recent CT. EXAM: MRI ABDOMEN WITHOUT CONTRAST  (INCLUDING MRCP) TECHNIQUE: Multiplanar multisequence MR imaging of the abdomen was performed. Heavily T2-weighted images of the biliary and pancreatic ducts were obtained, and three-dimensional MRCP images were rendered by post processing. COMPARISON:  Abdomen/pelvis CT 01/12/2018 FINDINGS: Lower chest: Unremarkable. Hepatobiliary: No focal abnormality in the liver on this study without intravenous contrast. Gallstones measuring up to about 11 mm are noted in the gallbladder. No gallbladder wall thickening or pericholecystic fluid. Intra and extrahepatic biliary duct dilatation is evident. Extrahepatic common duct measures 8 mm diameter. Common bile duct in the head of the pancreas measures 10 mm. In the distal common bile duct, tiny adjacent common bile duct stones measuring 2-3 mm are evident (images 22 and 23/series 8 and image 24/series 12). Pancreas: No focal mass lesion. No dilatation of the main duct. No intraparenchymal cyst. No peripancreatic edema. Spleen:  No splenomegaly. No focal mass lesion. Adrenals/Urinary Tract: No adrenal nodule or mass. 6 mm probable cyst noted upper pole right kidney with 2.1 cm probable cyst noted in the interpolar left kidney Stomach/Bowel: Stomach is nondistended. No gastric wall thickening. No evidence of outlet obstruction. Duodenum is normally positioned as is the ligament of Treitz. No small bowel or colonic dilatation within the visualized abdomen. Vascular/Lymphatic: No abdominal aortic aneurysm. No evidence for abdominal lymphadenopathy Other:  No intraperitoneal free fluid. Musculoskeletal: No abnormal marrow signal within the visualized bony anatomy. IMPRESSION: 1. Cholelithiasis with biliary dilatation. Common bile duct measures up to 10 mm with 2 adjacent tiny distal common bile duct stones evident. Electronically Signed   By: Misty Stanley M.D.   On: 01/31/2018 15:19   Dg C-arm 1-60 Min-no  Report  Result Date: 02/04/2018 Fluoroscopy was utilized by the requesting physician.  No radiographic interpretation.    Assessment and Plan:   Cathy Barnes is a 77 y.o. y/o female who comes in today for follow-up after having ERCP.  The patient still has right-sided abdominal pain.  The patient has a stent placed due to bleeding after sphincterotomy.  The patient will have a repeat ERCP in the next few weeks to remove the stent and should follow-up with surgery for cholecystectomy.  Patient has been spent the plan and agrees with it.    Lucilla Lame, MD. Marval Regal   Note: This dictation was prepared with Dragon dictation along with smaller phrase technology. Any transcriptional errors that result from this process are unintentional.

## 2018-02-17 ENCOUNTER — Other Ambulatory Visit: Payer: Self-pay

## 2018-02-17 DIAGNOSIS — K8050 Calculus of bile duct without cholangitis or cholecystitis without obstruction: Secondary | ICD-10-CM

## 2018-02-21 ENCOUNTER — Other Ambulatory Visit: Payer: Self-pay | Admitting: Physician Assistant

## 2018-02-21 ENCOUNTER — Ambulatory Visit: Payer: Self-pay | Admitting: Surgery

## 2018-02-21 DIAGNOSIS — G4700 Insomnia, unspecified: Secondary | ICD-10-CM

## 2018-02-25 ENCOUNTER — Encounter: Payer: Self-pay | Admitting: *Deleted

## 2018-02-28 ENCOUNTER — Ambulatory Visit: Payer: Medicare Other

## 2018-02-28 ENCOUNTER — Encounter: Payer: Self-pay | Admitting: *Deleted

## 2018-02-28 ENCOUNTER — Encounter
Admission: RE | Disposition: A | Discharge status: Home / Self Care | Payer: Self-pay | Source: Ambulatory Visit | Attending: Gastroenterology

## 2018-02-28 ENCOUNTER — Ambulatory Visit: Payer: Medicare Other | Admitting: Certified Registered Nurse Anesthetist

## 2018-02-28 ENCOUNTER — Ambulatory Visit
Admission: RE | Admit: 2018-02-28 | Discharge: 2018-02-28 | Disposition: A | Discharge status: Home / Self Care | Payer: Medicare Other | Source: Ambulatory Visit | Attending: Gastroenterology | Admitting: Gastroenterology

## 2018-02-28 DIAGNOSIS — D122 Benign neoplasm of ascending colon: Secondary | ICD-10-CM

## 2018-02-28 DIAGNOSIS — Z7982 Long term (current) use of aspirin: Secondary | ICD-10-CM | POA: Insufficient documentation

## 2018-02-28 DIAGNOSIS — K8020 Calculus of gallbladder without cholecystitis without obstruction: Secondary | ICD-10-CM | POA: Diagnosis not present

## 2018-02-28 DIAGNOSIS — Z9689 Presence of other specified functional implants: Secondary | ICD-10-CM

## 2018-02-28 DIAGNOSIS — R933 Abnormal findings on diagnostic imaging of other parts of digestive tract: Secondary | ICD-10-CM | POA: Diagnosis not present

## 2018-02-28 DIAGNOSIS — I10 Essential (primary) hypertension: Secondary | ICD-10-CM | POA: Insufficient documentation

## 2018-02-28 DIAGNOSIS — Z4659 Encounter for fitting and adjustment of other gastrointestinal appliance and device: Secondary | ICD-10-CM | POA: Diagnosis not present

## 2018-02-28 DIAGNOSIS — Z79899 Other long term (current) drug therapy: Secondary | ICD-10-CM | POA: Diagnosis not present

## 2018-02-28 DIAGNOSIS — K219 Gastro-esophageal reflux disease without esophagitis: Secondary | ICD-10-CM | POA: Insufficient documentation

## 2018-02-28 DIAGNOSIS — D125 Benign neoplasm of sigmoid colon: Secondary | ICD-10-CM

## 2018-02-28 DIAGNOSIS — D490 Neoplasm of unspecified behavior of digestive system: Secondary | ICD-10-CM | POA: Diagnosis not present

## 2018-02-28 DIAGNOSIS — K8050 Calculus of bile duct without cholangitis or cholecystitis without obstruction: Secondary | ICD-10-CM

## 2018-02-28 HISTORY — PX: ERCP: SHX5425

## 2018-02-28 SURGERY — ERCP, WITH INTERVENTION IF INDICATED
Anesthesia: General

## 2018-02-28 MED ORDER — FENTANYL CITRATE (PF) 100 MCG/2ML IJ SOLN
INTRAMUSCULAR | Status: AC
  Filled 2018-02-28: qty 2

## 2018-02-28 MED ORDER — SODIUM CHLORIDE 0.9 % IV SOLN
INTRAVENOUS | Status: DC
  Administered 2018-02-28: 1000 mL via INTRAVENOUS

## 2018-02-28 MED ORDER — PROPOFOL 10 MG/ML IV BOLUS
INTRAVENOUS | Status: AC
  Filled 2018-02-28: qty 20

## 2018-02-28 MED ORDER — GLYCOPYRROLATE 0.2 MG/ML IJ SOLN
INTRAMUSCULAR | Status: AC
  Filled 2018-02-28: qty 1

## 2018-02-28 MED ORDER — PROPOFOL 10 MG/ML IV BOLUS
INTRAVENOUS | Status: DC | PRN
  Administered 2018-02-28: 100 mg via INTRAVENOUS
  Administered 2018-02-28: 50 mg via INTRAVENOUS

## 2018-02-28 MED ORDER — SUCCINYLCHOLINE CHLORIDE 20 MG/ML IJ SOLN
INTRAMUSCULAR | Status: AC
  Filled 2018-02-28: qty 1

## 2018-02-28 MED ORDER — GLYCOPYRROLATE 0.2 MG/ML IJ SOLN
INTRAMUSCULAR | Status: DC | PRN
  Administered 2018-02-28: 0.2 mg via INTRAVENOUS

## 2018-02-28 MED ORDER — FENTANYL CITRATE (PF) 100 MCG/2ML IJ SOLN
INTRAMUSCULAR | Status: DC | PRN
  Administered 2018-02-28: 100 ug via INTRAVENOUS

## 2018-02-28 MED ORDER — ONDANSETRON HCL 4 MG/2ML IJ SOLN
INTRAMUSCULAR | Status: DC | PRN
  Administered 2018-02-28: 4 mg via INTRAVENOUS

## 2018-02-28 MED ORDER — ONDANSETRON HCL 4 MG/2ML IJ SOLN
INTRAMUSCULAR | Status: AC
  Filled 2018-02-28: qty 2

## 2018-02-28 MED ORDER — SUCCINYLCHOLINE CHLORIDE 20 MG/ML IJ SOLN
INTRAMUSCULAR | Status: DC | PRN
  Administered 2018-02-28: 80 mg via INTRAVENOUS

## 2018-02-28 NOTE — Anesthesia Postprocedure Evaluation (Signed)
Anesthesia Post Note  [name]  Procedure(s) Performed: ENDOSCOPIC RETROGRADE CHOLANGIOPANCREATOGRAPHY (ERCP) STENT REMOVAL (N/A )  Patient location during evaluation: PACU Anesthesia Type: General Level of consciousness: awake and alert Pain management: pain level controlled Vital Signs Assessment: post-procedure vital signs reviewed and stable Respiratory status: spontaneous breathing and respiratory function stable Cardiovascular status: stable Anesthetic complications: no     Last Vitals:  Vitals:   02/28/18 1410 02/28/18 1424  BP: 139/62 (!) 158/66  Pulse: (!) 59 (!) 58  Resp: 15 16  Temp: (!) 36.1 C 36.6 C  SpO2: 96% 96%    Last Pain:  Vitals:   02/28/18 1424  TempSrc: Tympanic  PainSc: 0-No pain                 Darneshia Demary K

## 2018-02-28 NOTE — Transfer of Care (Signed)
Immediate Anesthesia Transfer of Care Note  Patient: Derinda Late  Procedure(s) Performed: ENDOSCOPIC RETROGRADE CHOLANGIOPANCREATOGRAPHY (ERCP) STENT REMOVAL (N/A )  Patient Location: PACU  Anesthesia Type:General  Level of Consciousness: awake, alert  and oriented  Airway & Oxygen Therapy: Patient Spontanous Breathing  Post-op Assessment: Report given to RN and Post -op Vital signs reviewed and stable  Post vital signs: Reviewed and stable  Last Vitals:  Vitals Value Taken Time  BP 118/69 02/28/2018  1:55 PM  Temp 36 C 02/28/2018  1:55 PM  Pulse 65 02/28/2018  1:59 PM  Resp 14 02/28/2018  1:59 PM  SpO2 98 % 02/28/2018  1:59 PM  Vitals shown include unvalidated device data.  Last Pain:  Vitals:   02/28/18 1355  TempSrc:   PainSc: 0-No pain         Complications: No apparent anesthesia complications

## 2018-02-28 NOTE — Anesthesia Preprocedure Evaluation (Signed)
Anesthesia Evaluation  Patient identified by MRN, date of birth, ID band Patient awake    Reviewed: Allergy & Precautions, NPO status , Patient's Chart, lab work & pertinent test results  History of Anesthesia Complications Negative for: history of anesthetic complications  Airway Mallampati: III       Dental   Pulmonary neg sleep apnea, neg COPD,           Cardiovascular hypertension, Pt. on medications and Pt. on home beta blockers (-) Past MI and (-) CHF (-) dysrhythmias (-) Valvular Problems/Murmurs     Neuro/Psych neg Seizures    GI/Hepatic Neg liver ROS, PUD, GERD  Medicated and Poorly Controlled,  Endo/Other  neg diabetes  Renal/GU negative Renal ROS     Musculoskeletal   Abdominal   Peds  Hematology   Anesthesia Other Findings   Reproductive/Obstetrics                             Anesthesia Physical Anesthesia Plan  ASA: III  Anesthesia Plan: General   Post-op Pain Management:    Induction: Intravenous  PONV Risk Score and Plan: 3 and Dexamethasone, Ondansetron and Midazolam  Airway Management Planned: Oral ETT  Additional Equipment:   Intra-op Plan:   Post-operative Plan:   Informed Consent: I have reviewed the patients History and Physical, chart, labs and discussed the procedure including the risks, benefits and alternatives for the proposed anesthesia with the patient or authorized representative who has indicated his/her understanding and acceptance.     Plan Discussed with:   Anesthesia Plan Comments:         Anesthesia Quick Evaluation

## 2018-02-28 NOTE — Anesthesia Post-op Follow-up Note (Signed)
Anesthesia QCDR form completed.        

## 2018-02-28 NOTE — Op Note (Addendum)
[facility] Patient Name: Cathy Barnes Procedure Date: 02/28/2018 1:10 PM MRN: [id number] Account #: [id number] Date of Birth: 11/23/1940 Admit Type: Outpatient Age: 77 Room: [facility] ENDO ROOM 4 Gender: Female Note Status: Finalized Procedure:            ERCP Indications:          Stent removal Providers:            Lucilla Lame MD, MD Referring MD:         Mar Daring (Referring MD) Medicines:            General Anesthesia Complications:        No immediate complications. Procedure:            Pre-Anesthesia Assessment:                       - Prior to the procedure, a History and Physical was                        performed, and patient medications and allergies were                        reviewed. The patient's tolerance of previous                        anesthesia was also reviewed. The risks and benefits of                        the procedure and the sedation options and risks were                        discussed with the patient. All questions were                        answered, and informed consent was obtained. Prior                        Anticoagulants: The patient has taken no previous                        anticoagulant or antiplatelet agents. ASA Grade                        Assessment: II - A patient with mild systemic disease.                        After reviewing the risks and benefits, the patient was                        deemed in satisfactory condition to undergo the                        procedure.                       After obtaining informed consent, the scope was passed                        under direct vision. Throughout the procedure, the  patient's blood pressure, pulse, and oxygen saturations                        were monitored continuously. The Duodenoscope was                        introduced through the mouth, and used to inject                        contrast into and  used to inject contrast into the bile                        duct. The ERCP was accomplished without difficulty. The                        patient tolerated the procedure well. Findings:      A biliary stent was visible on the scout film. One plastic stent       originating in the biliary tree was emerging from the major papilla. The       stent was visibly patent. One stent was removed from the common bile       duct using a snare. The bile duct was deeply cannulated with the       short-nosed traction sphincterotome. Contrast was injected. I personally       interpreted the bile duct images. There was brisk flow of contrast       through the ducts. Image quality was excellent. Contrast extended to the       entire biliary tree. To discover objects, the biliary tree was swept       with a 15 mm balloon starting at the bifurcation. Nothing was found. Impression:           - One visibly patent stent from the biliary tree was                        seen in the major papilla.                       - One stent was removed from the common bile duct.                       - The biliary tree was swept and nothing was found. Recommendation:       - Discharge patient to home.                       - Resume previous diet.                       - Continue present medications. Procedure Code(s):    --- Professional ---                       [id number], Endoscopic retrograde cholangiopancreatography                        (ERCP); with removal of foreign body(s) or stent(s)                        from biliary/pancreatic duct(s)  74328, Endoscopic catheterization of the biliary ductal                        system, radiological supervision and interpretation Diagnosis Code(s):    --- Professional ---                       Z96.89, Presence of other specified functional implants                       Z46.59, Encounter for fitting and adjustment of other                         gastrointestinal appliance and device CPT copyright 2018 American Medical Association. All rights reserved. The codes documented in this report are preliminary and upon coder review may  be revised to meet current compliance requirements. Lucilla Lame MD, MD 02/28/2018 1:41:22 PM This report has been signed electronically. Number of Addenda: 0 Note Initiated On: 02/28/2018 1:10 PM      [facility]

## 2018-02-28 NOTE — Anesthesia Procedure Notes (Signed)
Procedure Name: Intubation Date/Time: 02/28/2018 1:19 PM Performed by: Jonna Clark, CRNA Pre-anesthesia Checklist: Patient identified, Patient being monitored, Timeout performed, Emergency Drugs available and Suction available Patient Re-evaluated:Patient Re-evaluated prior to induction Oxygen Delivery Method: Circle system utilized Preoxygenation: Pre-oxygenation with 100% oxygen Induction Type: IV induction, Rapid sequence and Cricoid Pressure applied Ventilation: Mask ventilation without difficulty Laryngoscope Size: Mac and 3 Grade View: Grade I Tube type: Oral Tube size: 7.0 mm Number of attempts: 1 Airway Equipment and Method: Stylet Placement Confirmation: ETT inserted through vocal cords under direct vision,  positive ETCO2 and breath sounds checked- equal and bilateral Secured at: 21 cm Tube secured with: Tape Dental Injury: Teeth and Oropharynx as per pre-operative assessment

## 2018-02-28 NOTE — Interval H&P Note (Signed)
History and Physical Interval Note:  02/28/2018 1:00 PM  Cathy Barnes  has presented today for surgery, with the diagnosis of CHOLEDOCHOLITHIASIS k80.50  The various methods of treatment have been discussed with the patient and family. After consideration of risks, benefits and other options for treatment, the patient has consented to  Procedure(s): ENDOSCOPIC RETROGRADE CHOLANGIOPANCREATOGRAPHY (ERCP) STENT REMOVAL (N/A) as a surgical intervention .  The patient's history has been reviewed, patient examined, no change in status, stable for surgery.  I have reviewed the patient's chart and labs.  Questions were answered to the patient's satisfaction.     Rollen Selders [employer]

## 2018-03-01 ENCOUNTER — Encounter: Payer: Self-pay | Admitting: Gastroenterology

## 2018-03-02 ENCOUNTER — Other Ambulatory Visit: Payer: Self-pay

## 2018-03-02 ENCOUNTER — Encounter: Payer: Self-pay | Admitting: Surgery

## 2018-03-02 ENCOUNTER — Ambulatory Visit: Payer: Medicare Other | Admitting: Surgery

## 2018-03-02 VITALS — BP 180/83 | HR 67 | Temp 97.7°F | Resp 18 | Ht 65.5 in | Wt 194.0 lb

## 2018-03-02 DIAGNOSIS — K8050 Calculus of bile duct without cholangitis or cholecystitis without obstruction: Secondary | ICD-10-CM

## 2018-03-02 NOTE — Patient Instructions (Addendum)
The patient is scheduled for surgery at [facility] on 03/08/18 with Dr Dahlia Byes. She will pre admit at the hospital. The patient is aware of date and instructions.     Cholelithiasis Cholelithiasis is also called "gallstones." It is a kind of gallbladder disease. The gallbladder is an organ that stores a liquid (bile) that helps you digest fat. Gallstones may not cause symptoms (may be silent gallstones) until they cause a blockage, and then they can cause pain (gallbladder attack). Follow these instructions at home:  Take over-the-counter and prescription medicines only as told by your doctor.  Stay at a healthy weight.  Eat healthy foods. This includes: ? Eating fewer fatty foods, like fried foods. ? Eating fewer refined carbs (refined carbohydrates). Refined carbs are breads and grains that are highly processed, like white bread and white rice. Instead, choose whole grains like whole-wheat bread and brown rice. ? Eating more fiber. Almonds, fresh fruit, and beans are healthy sources of fiber.  Keep all follow-up visits as told by your doctor. This is important. Contact a doctor if:  You have sudden pain in the upper right side of your belly (abdomen). Pain might spread to your right shoulder or your chest. This may be a sign of a gallbladder attack.  You feel sick to your stomach (are nauseous).  You throw up (vomit).  You have been diagnosed with gallstones that have no symptoms and you get: ? Belly pain. ? Discomfort, burning, or fullness in the upper part of your belly (indigestion). Get help right away if:  You have sudden pain in the upper right side of your belly, and it lasts for more than 2 hours.  You have belly pain that lasts for more than 5 hours.  You have a fever or chills.  You keep feeling sick to your stomach or you keep throwing up.  Your skin or the whites of your eyes turn yellow (jaundice).  You have dark-colored pee (urine).  You have light-colored poop  (stool). Summary  Cholelithiasis is also called "gallstones."  The gallbladder is an organ that stores a liquid (bile) that helps you digest fat.  Silent gallstones are gallstones that do not cause symptoms.  A gallbladder attack may cause sudden pain in the upper right side of your belly. Pain might spread to your right shoulder or your chest. If this happens, contact your doctor.  If you have sudden pain in the upper right side of your belly that lasts for more than 2 hours, get help right away. This information is not intended to replace advice given to you by your health care provider. Make sure you discuss any questions you have with your health care provider. Document Released: 09/23/2007 Document Revised: 12/22/2015 Document Reviewed: 12/22/2015 Elsevier Interactive Patient Education  2017 [employer].

## 2018-03-03 ENCOUNTER — Telehealth: Payer: Self-pay

## 2018-03-03 ENCOUNTER — Encounter: Payer: Self-pay | Admitting: Surgery

## 2018-03-03 NOTE — H&P (View-Only) (Signed)
Outpatient Surgical Follow Up  03/03/2018  Cathy Barnes is an 77 y.o. female.   Chief Complaint  Patient presents with  . New Patient (Initial Visit)    Cholelithiasis    HPI: Cathy Barnes is a 77 year old female with a prior history of symptomatic cholelithiasis that was complicated with choledocholithiasis requiring ERCP by Dr.Wohl.  Personally reviewed the images from the ERCP.  She has done well since then.  She reports persistent right upper quadrant and right chest wall pain.  The pain is intermittent sharp and moderate intensity.  No specific alleviating or aggravating factors.  No fevers no chills no evidence of cholangitis no evidence of biliary obstruction no evidence of jaundice Able to perform more than 4 METS of activity without any shortness of breath chest pain  Past Medical History:  Diagnosis Date  . Allergy    some  . Cataract    bilaterally both eyes   . GERD (gastroesophageal reflux disease)   . Hydrocephalus (Anza)    [facility]   . Hyperglycemia   . Hyperlipidemia   . Hypertension   . Lymphocytosis    monoclonal b cell  . Migraine, unspecified, without mention of intractable migraine without mention of status migrainosus 12/14/2012  . Osteoarthritis, chronic   . Osteoporosis   . Peptic ulcer    some bleeding with ulcers as well     Past Surgical History:  Procedure Laterality Date  . ABDOMINAL HYSTERECTOMY    . BREAST CYST ASPIRATION Left 1993   Removed  . breast cyst removed Right    benign   . COLONOSCOPY    . ERCP N/A 02/04/2018   Procedure: ENDOSCOPIC RETROGRADE CHOLANGIOPANCREATOGRAPHY (ERCP);  Surgeon: Lucilla Lame, MD;  Location: [facility] ENDOSCOPY;  Service: Endoscopy;  Laterality: N/A;  . ERCP N/A 02/28/2018   Procedure: ENDOSCOPIC RETROGRADE CHOLANGIOPANCREATOGRAPHY (ERCP) STENT REMOVAL;  Surgeon: Lucilla Lame, MD;  Location: [facility] ENDOSCOPY;  Service: Endoscopy;  Laterality: N/A;  . EYE SURGERY    . growth removal Left 2019   L wrist growth  removed, abnormal cells  . History of Cataract surgery Right 2011   left 2011  . PARTIAL HYSTERECTOMY    . POLYPECTOMY    . S/P ACL repair Left    knee  . TUBAL LIGATION  1975   Bilateral    Family History  Problem Relation Age of Onset  . Colon cancer Mother   . Hypertension Mother   . Colon cancer Father   . Heart disease Father   . Hypertension Father   . Colon polyps Neg Hx   . Rectal cancer Neg Hx   . Stomach cancer Neg Hx     Social History:  reports that she has never smoked. She has never used smokeless tobacco. She reports that she drinks about 1.0 standard drinks of alcohol per week. She reports that she does not use drugs.  Allergies:  Allergies  Allergen Reactions  . Shellfish Allergy     Throat closes, rash  . Latex Rash    Tapes,adhesives    Medications reviewed.    ROS Full ROS performed and is otherwise negative other than what is stated in HPI   BP (!) 180/83   Pulse 67   Temp 97.7 F (36.5 C) (Temporal)   Resp 18   Ht 5' 5.5" (1.664 m)   Wt 194 lb (88 kg)   SpO2 98%   BMI 31.79 kg/m   Physical Exam  Constitutional: She is oriented to person, place,  and time. She appears well-developed and well-nourished. No distress.  Neck: Normal range of motion. No JVD present. No tracheal deviation present. No thyromegaly present.  Cardiovascular: Normal rate, regular rhythm and normal heart sounds.  Pulmonary/Chest: Effort normal. No stridor. No respiratory distress. She has no wheezes. She has no rales.  Abdominal: Soft. She exhibits no distension and no mass. There is tenderness. There is no rebound and no guarding.  TTP RUQ and right chest wall  Musculoskeletal: Normal range of motion. She exhibits no edema.  Neurological: She is alert and oriented to person, place, and time. She displays normal reflexes. No cranial nerve deficit. She exhibits normal muscle tone. Coordination normal.  Skin: Skin is warm and dry. Capillary refill takes less than 2  seconds.  Psychiatric: She has a normal mood and affect. Her behavior is normal. Thought content normal.  Nursing note and vitals reviewed.       Assessment/Plan: 77 year old with symptomatic cholelithiasis and a history of choledocholithiasis.  I do recommend laparoscopic cholecystectomy I think she is a good candidate for robotic assistedprocedure  The risks, benefits, complications, treatment options, and expected outcomes were discussed with the patient. The possibilities of bleeding, recurrent infection, finding a normal gallbladder, perforation of viscus organs, damage to surrounding structures, bile leak, abscess formation, needing a drain placed, the need for additional procedures, reaction to medication, pulmonary aspiration,  failure to diagnose a condition, the possible need to convert to an open procedure, and creating a complication requiring transfusion or operation were discussed with the patient. The patient and/or family concurred with the proposed plan, giving informed consent.  Greater than 50% of the 33minutes  visit was spent in counseling/coordination of care   Caroleen Hamman, MD [place] [facility]

## 2018-03-03 NOTE — Progress Notes (Signed)
Outpatient Surgical Follow Up  03/03/2018  Cathy Barnes is an 77 y.o. female.   Chief Complaint  Patient presents with  . New Patient (Initial Visit)    Cholelithiasis    HPI: Cathy Barnes is a 77 year old female with a prior history of symptomatic cholelithiasis that was complicated with choledocholithiasis requiring ERCP by Dr.Wohl.  Personally reviewed the images from the ERCP.  She has done well since then.  She reports persistent right upper quadrant and right chest wall pain.  The pain is intermittent sharp and moderate intensity.  No specific alleviating or aggravating factors.  No fevers no chills no evidence of cholangitis no evidence of biliary obstruction no evidence of jaundice Able to perform more than 4 METS of activity without any shortness of breath chest pain  Past Medical History:  Diagnosis Date  . Allergy    some  . Cataract    bilaterally both eyes   . GERD (gastroesophageal reflux disease)   . Hydrocephalus ([place])    [facility]   . Hyperglycemia   . Hyperlipidemia   . Hypertension   . Lymphocytosis    monoclonal b cell  . Migraine, unspecified, without mention of intractable migraine without mention of status migrainosus 12/14/2012  . Osteoarthritis, chronic   . Osteoporosis   . Peptic ulcer    some bleeding with ulcers as well     Past Surgical History:  Procedure Laterality Date  . ABDOMINAL HYSTERECTOMY    . BREAST CYST ASPIRATION Left 1993   Removed  . breast cyst removed Right    benign   . COLONOSCOPY    . ERCP N/A 02/04/2018   Procedure: ENDOSCOPIC RETROGRADE CHOLANGIOPANCREATOGRAPHY (ERCP);  Surgeon: Lucilla Lame, MD;  Location: [facility] ENDOSCOPY;  Service: Endoscopy;  Laterality: N/A;  . ERCP N/A 02/28/2018   Procedure: ENDOSCOPIC RETROGRADE CHOLANGIOPANCREATOGRAPHY (ERCP) STENT REMOVAL;  Surgeon: Lucilla Lame, MD;  Location: [facility] ENDOSCOPY;  Service: Endoscopy;  Laterality: N/A;  . EYE SURGERY    . growth removal Left 2019   L wrist growth  removed, abnormal cells  . History of Cataract surgery Right 2011   left 2011  . PARTIAL HYSTERECTOMY    . POLYPECTOMY    . S/P ACL repair Left    knee  . TUBAL LIGATION  1975   Bilateral    Family History  Problem Relation Age of Onset  . Colon cancer Mother   . Hypertension Mother   . Colon cancer Father   . Heart disease Father   . Hypertension Father   . Colon polyps Neg Hx   . Rectal cancer Neg Hx   . Stomach cancer Neg Hx     Social History:  reports that she has never smoked. She has never used smokeless tobacco. She reports that she drinks about 1.0 standard drinks of alcohol per week. She reports that she does not use drugs.  Allergies:  Allergies  Allergen Reactions  . Shellfish Allergy     Throat closes, rash  . Latex Rash    Tapes,adhesives    Medications reviewed.    ROS Full ROS performed and is otherwise negative other than what is stated in HPI   BP (!) 180/83   Pulse 67   Temp 97.7 F (36.5 C) (Temporal)   Resp 18   Ht 5' 5.5" (1.664 m)   Wt 194 lb (88 kg)   SpO2 98%   BMI 31.79 kg/m   Physical Exam  Constitutional: She is oriented to person, place,  and time. She appears well-developed and well-nourished. No distress.  Neck: Normal range of motion. No JVD present. No tracheal deviation present. No thyromegaly present.  Cardiovascular: Normal rate, regular rhythm and normal heart sounds.  Pulmonary/Chest: Effort normal. No stridor. No respiratory distress. She has no wheezes. She has no rales.  Abdominal: Soft. She exhibits no distension and no mass. There is tenderness. There is no rebound and no guarding.  TTP RUQ and right chest wall  Musculoskeletal: Normal range of motion. She exhibits no edema.  Neurological: She is alert and oriented to person, place, and time. She displays normal reflexes. No cranial nerve deficit. She exhibits normal muscle tone. Coordination normal.  Skin: Skin is warm and dry. Capillary refill takes less than 2  seconds.  Psychiatric: She has a normal mood and affect. Her behavior is normal. Thought content normal.  Nursing note and vitals reviewed.       Assessment/Plan: 77 year old with symptomatic cholelithiasis and a history of choledocholithiasis.  I do recommend laparoscopic cholecystectomy I think she is a good candidate for robotic assistedprocedure  The risks, benefits, complications, treatment options, and expected outcomes were discussed with the patient. The possibilities of bleeding, recurrent infection, finding a normal gallbladder, perforation of viscus organs, damage to surrounding structures, bile leak, abscess formation, needing a drain placed, the need for additional procedures, reaction to medication, pulmonary aspiration,  failure to diagnose a condition, the possible need to convert to an open procedure, and creating a complication requiring transfusion or operation were discussed with the patient. The patient and/or family concurred with the proposed plan, giving informed consent.  Greater than 50% of the 20minutes  visit was spent in counseling/coordination of care   Caroleen Hamman, MD [place] Surgeon

## 2018-03-03 NOTE — Telephone Encounter (Signed)
The patient will pre admit at the hospital on 03/07/18 at 12:45 pm. The patient is aware of date, time, and instructions.

## 2018-03-04 ENCOUNTER — Telehealth: Payer: Self-pay | Admitting: Physician Assistant

## 2018-03-04 NOTE — Telephone Encounter (Signed)
° °  [facility] pre admissions [phone])   is saying she cannot see the labs done on pt from Sept.16 that [facility] ordered for pt.  Needing for Gallbladder Surgery on Tues.  Pt is sure she had this done at [facility].  Please advise asap.  Thanks, [employer]

## 2018-03-04 NOTE — Telephone Encounter (Signed)
No labs were done on 01/03/18. There were some done on 02/02/18 by Dr. Dahlia Byes. Please print and fax for patient.

## 2018-03-07 ENCOUNTER — Telehealth: Payer: Self-pay | Admitting: *Deleted

## 2018-03-07 ENCOUNTER — Encounter
Admission: RE | Admit: 2018-03-07 | Discharge: 2018-03-07 | Disposition: A | Discharge status: Home / Self Care | Payer: Medicare Other | Source: Ambulatory Visit | Attending: Surgery | Admitting: Surgery

## 2018-03-07 ENCOUNTER — Other Ambulatory Visit: Payer: Self-pay

## 2018-03-07 ENCOUNTER — Telehealth: Payer: Self-pay

## 2018-03-07 DIAGNOSIS — Z8711 Personal history of peptic ulcer disease: Secondary | ICD-10-CM | POA: Diagnosis not present

## 2018-03-07 DIAGNOSIS — G919 Hydrocephalus, unspecified: Secondary | ICD-10-CM | POA: Diagnosis not present

## 2018-03-07 DIAGNOSIS — E785 Hyperlipidemia, unspecified: Secondary | ICD-10-CM | POA: Diagnosis not present

## 2018-03-07 DIAGNOSIS — I252 Old myocardial infarction: Secondary | ICD-10-CM | POA: Insufficient documentation

## 2018-03-07 DIAGNOSIS — K219 Gastro-esophageal reflux disease without esophagitis: Secondary | ICD-10-CM | POA: Diagnosis not present

## 2018-03-07 DIAGNOSIS — Z79899 Other long term (current) drug therapy: Secondary | ICD-10-CM | POA: Diagnosis not present

## 2018-03-07 DIAGNOSIS — Z01818 Encounter for other preprocedural examination: Secondary | ICD-10-CM | POA: Insufficient documentation

## 2018-03-07 DIAGNOSIS — Z888 Allergy status to other drugs, medicaments and biological substances status: Secondary | ICD-10-CM | POA: Diagnosis not present

## 2018-03-07 DIAGNOSIS — I10 Essential (primary) hypertension: Secondary | ICD-10-CM | POA: Insufficient documentation

## 2018-03-07 DIAGNOSIS — K8010 Calculus of gallbladder with chronic cholecystitis without obstruction: Secondary | ICD-10-CM | POA: Diagnosis present

## 2018-03-07 LAB — POTASSIUM: Potassium: 3.8 mmol/L (ref 3.5–5.1)

## 2018-03-07 MED ORDER — CEFAZOLIN SODIUM-DEXTROSE 2-4 GM/100ML-% IV SOLN
2.0000 g | INTRAVENOUS | Status: AC
  Administered 2018-03-08: 2 g via INTRAVENOUS

## 2018-03-07 NOTE — Telephone Encounter (Signed)
Per Dr. Dahlia Byes, he has spoken to the anesthesiologist and patient will not need medical clearance prior to surgery scheduled for tomorrow, 03-08-18.   I had called the patient to inform her of this but there was no answer on home or cell phone. Message left that if patient gets my message she can call the office before 5 pm today or just arrive at the time that SDS told her to report to [facility] tomorrow.   Surgery as scheduled for 03-08-18.

## 2018-03-07 NOTE — Telephone Encounter (Signed)
Form was faxed to preadmit and Dr.Pabon

## 2018-03-07 NOTE — Patient Instructions (Signed)
Your procedure is scheduled on: Tuesday, March 08, 2018 Report to Day Surgery on the 2nd floor of the [facility]. To find out your arrival time, please call [phone] between 1PM - 3PM on: Monday, November 18.  REMEMBER: Instructions that are not followed completely may result in serious medical risk, up to and including death; or upon the discretion of your surgeon and anesthesiologist your surgery may need to be rescheduled.  Do not eat food after midnight the night before surgery.  No gum chewing, lozengers or hard candies.  You may however, drink CLEAR liquids up to 2 hours before you are scheduled to arrive for your surgery. Do not drink anything within 2 hours of the start of your surgery.  Clear liquids include: - water  - apple juice without pulp - gatorade - black coffee or tea (Do NOT add milk or creamers to the coffee or tea) Do NOT drink anything that is not on this list.  No Alcohol for 24 hours before or after surgery.  No Smoking including e-cigarettes for 24 hours prior to surgery.  No chewable tobacco products for at least 6 hours prior to surgery.  No nicotine patches on the day of surgery.  On the morning of surgery brush your teeth with toothpaste and water, you may rinse your mouth with mouthwash if you wish. Do not swallow any toothpaste or mouthwash.  Notify your doctor if there is any change in your medical condition (cold, fever, infection).  Do not wear jewelry, make-up, hairpins, clips or nail polish.  Do not wear lotions, powders, or perfumes.   Do not shave 48 hours prior to surgery.   Contacts and dentures may not be worn into surgery.  Do not bring valuables to the hospital, including drivers license, insurance or credit cards.  [facility] is not responsible for any belongings or valuables.   TAKE THESE MEDICATIONS THE MORNING OF SURGERY:  1.  Atenolol 2.  Gabapentin 3.  Levothyroxine 4.  Verapamil 5.  Hydrocodone (if needed for  pain)  Use CHG Soap or wipes as directed on instruction sheet.  NOW!  Stop ASPIRIN and Anti-inflammatories (NSAIDS) such as Advil, Aleve, Ibuprofen, Motrin, Naproxen, Naprosyn and Aspirin based products such as Excedrin, Goodys Powder, BC Powder. (May take Tylenol or Acetaminophen if needed.)  Now!  Stop ANY OVER THE COUNTER supplements until after surgery. (May continue Vitamin D, Vitamin B, and multivitamin.)  Wear comfortable clothing (specific to your surgery type) to the hospital.  Plan for stool softeners for home use.  If you are being discharged the day of surgery, you will not be allowed to drive home. You will need a responsible adult to drive you home and stay with you that night.   If you are taking public transportation, you will need to have a responsible adult with you. Please confirm with your physician that it is acceptable to use public transportation.   Please call [phone] if you have any questions about these instructions.

## 2018-03-07 NOTE — Pre-Procedure Instructions (Signed)
DR PABON SPOKE WITH DR Ola Spurr AND OK TO PROCEED TOMORROW. PCP NOTED EKG LOOKS OK

## 2018-03-07 NOTE — Telephone Encounter (Signed)
She is calling regarding patient's EKG. They wants Cathy Barnes to take a look over the EKG and to let them know if is ok for patient to proceed with surgery tomorrow.

## 2018-03-07 NOTE — Telephone Encounter (Signed)
It appears ok, but if patient is having chest pain, SOB, DOE or swelling of lower extremities she would need cardiac evaluation. When I last saw patient in Sept she was not having any of those things.

## 2018-03-07 NOTE — Telephone Encounter (Signed)
I received a call from [facility] with Anesthesia, she states patient will need medical clearance possible cardiac clearance prior to surgery due to recent EKG and they have no previous EKG for comparison.   The patient is scheduled as a first case for surgery with Dr. Dahlia Byes tomorrow, 03-08-18.  I have a call in to [facility] to see if they can get patient seen today so we can proceed with surgery tomorrow. Will await a call from their office.   Note routed to Dr. Dahlia Byes.

## 2018-03-08 ENCOUNTER — Other Ambulatory Visit: Payer: Self-pay

## 2018-03-08 ENCOUNTER — Encounter
Admission: RE | Disposition: A | Discharge status: Home / Self Care | Payer: Self-pay | Source: Ambulatory Visit | Attending: Surgery

## 2018-03-08 ENCOUNTER — Ambulatory Visit: Payer: Medicare Other | Admitting: Certified Registered Nurse Anesthetist

## 2018-03-08 ENCOUNTER — Ambulatory Visit
Admission: RE | Admit: 2018-03-08 | Discharge: 2018-03-08 | Disposition: A | Discharge status: Home / Self Care | Payer: Medicare Other | Source: Ambulatory Visit | Attending: Surgery | Admitting: Surgery

## 2018-03-08 DIAGNOSIS — K811 Chronic cholecystitis: Secondary | ICD-10-CM | POA: Diagnosis not present

## 2018-03-08 DIAGNOSIS — E785 Hyperlipidemia, unspecified: Secondary | ICD-10-CM | POA: Insufficient documentation

## 2018-03-08 DIAGNOSIS — I10 Essential (primary) hypertension: Secondary | ICD-10-CM | POA: Insufficient documentation

## 2018-03-08 DIAGNOSIS — Z79899 Other long term (current) drug therapy: Secondary | ICD-10-CM | POA: Insufficient documentation

## 2018-03-08 DIAGNOSIS — K8010 Calculus of gallbladder with chronic cholecystitis without obstruction: Secondary | ICD-10-CM | POA: Insufficient documentation

## 2018-03-08 DIAGNOSIS — G919 Hydrocephalus, unspecified: Secondary | ICD-10-CM | POA: Insufficient documentation

## 2018-03-08 DIAGNOSIS — K219 Gastro-esophageal reflux disease without esophagitis: Secondary | ICD-10-CM | POA: Insufficient documentation

## 2018-03-08 DIAGNOSIS — Z888 Allergy status to other drugs, medicaments and biological substances status: Secondary | ICD-10-CM | POA: Insufficient documentation

## 2018-03-08 DIAGNOSIS — K8050 Calculus of bile duct without cholangitis or cholecystitis without obstruction: Secondary | ICD-10-CM

## 2018-03-08 DIAGNOSIS — Z8711 Personal history of peptic ulcer disease: Secondary | ICD-10-CM | POA: Insufficient documentation

## 2018-03-08 HISTORY — PX: ROBOTIC ASSISTED LAPAROSCOPIC CHOLECYSTECTOMY-MULTI SITE: SHX6603

## 2018-03-08 SURGERY — ROBOTIC ASSISTED LAPAROSCOPIC CHOLECYSTECTOMY-MULTI SITE
Anesthesia: General

## 2018-03-08 MED ORDER — FAMOTIDINE 20 MG PO TABS
ORAL_TABLET | ORAL | Status: AC
  Administered 2018-03-08: 20 mg via ORAL
  Filled 2018-03-08: qty 1

## 2018-03-08 MED ORDER — CELECOXIB 200 MG PO CAPS
200.0000 mg | ORAL_CAPSULE | ORAL | Status: AC
  Administered 2018-03-08: 200 mg via ORAL

## 2018-03-08 MED ORDER — ROCURONIUM BROMIDE 100 MG/10ML IV SOLN
INTRAVENOUS | Status: DC | PRN
  Administered 2018-03-08: 20 mg via INTRAVENOUS
  Administered 2018-03-08: 50 mg via INTRAVENOUS

## 2018-03-08 MED ORDER — HYDROCODONE-ACETAMINOPHEN 5-325 MG PO TABS: 1.0000 | ORAL_TABLET | Freq: Four times a day (QID) | ORAL | 0 refills | Status: DC | PRN

## 2018-03-08 MED ORDER — SODIUM CHLORIDE 0.9 % IV SOLN
INTRAVENOUS | Status: DC
  Administered 2018-03-08: 08:00:00 via INTRAVENOUS

## 2018-03-08 MED ORDER — BUPIVACAINE-EPINEPHRINE (PF) 0.25% -1:200000 IJ SOLN
INTRAMUSCULAR | Status: AC
  Filled 2018-03-08: qty 30

## 2018-03-08 MED ORDER — PROMETHAZINE HCL 25 MG/ML IJ SOLN: 6.2500 mg | INTRAMUSCULAR | Status: DC | PRN

## 2018-03-08 MED ORDER — FENTANYL CITRATE (PF) 100 MCG/2ML IJ SOLN
INTRAMUSCULAR | Status: AC
  Filled 2018-03-08: qty 2

## 2018-03-08 MED ORDER — INDOCYANINE GREEN 25 MG IV SOLR
INTRAVENOUS | Status: AC
  Filled 2018-03-08: qty 25

## 2018-03-08 MED ORDER — ACETAMINOPHEN 500 MG PO TABS
1000.0000 mg | ORAL_TABLET | ORAL | Status: AC
  Administered 2018-03-08: 1000 mg via ORAL

## 2018-03-08 MED ORDER — SUGAMMADEX SODIUM 200 MG/2ML IV SOLN
INTRAVENOUS | Status: DC | PRN
  Administered 2018-03-08: 200 mg via INTRAVENOUS

## 2018-03-08 MED ORDER — CEFAZOLIN SODIUM-DEXTROSE 2-4 GM/100ML-% IV SOLN
INTRAVENOUS | Status: AC
  Filled 2018-03-08: qty 100

## 2018-03-08 MED ORDER — ROCURONIUM BROMIDE 50 MG/5ML IV SOLN
INTRAVENOUS | Status: AC
  Filled 2018-03-08: qty 2

## 2018-03-08 MED ORDER — GABAPENTIN 300 MG PO CAPS
300.0000 mg | ORAL_CAPSULE | ORAL | Status: AC
  Administered 2018-03-08: 300 mg via ORAL

## 2018-03-08 MED ORDER — BUPIVACAINE-EPINEPHRINE 0.25% -1:200000 IJ SOLN
INTRAMUSCULAR | Status: DC | PRN
  Administered 2018-03-08: 30 mL

## 2018-03-08 MED ORDER — INDOCYANINE GREEN 25 MG IV SOLR
7.5000 mg | Freq: Once | INTRAVENOUS | Status: AC
  Administered 2018-03-08: 7.5 mg via INTRAVENOUS
  Filled 2018-03-08: qty 25

## 2018-03-08 MED ORDER — ACETAMINOPHEN 500 MG PO TABS
ORAL_TABLET | ORAL | Status: AC
  Administered 2018-03-08: 1000 mg via ORAL
  Filled 2018-03-08: qty 2

## 2018-03-08 MED ORDER — LIDOCAINE HCL (CARDIAC) PF 100 MG/5ML IV SOSY
PREFILLED_SYRINGE | INTRAVENOUS | Status: DC | PRN
  Administered 2018-03-08: 100 mg via INTRAVENOUS

## 2018-03-08 MED ORDER — DEXAMETHASONE SODIUM PHOSPHATE 10 MG/ML IJ SOLN
INTRAMUSCULAR | Status: AC
  Filled 2018-03-08: qty 1

## 2018-03-08 MED ORDER — GLYCOPYRROLATE 0.2 MG/ML IJ SOLN
INTRAMUSCULAR | Status: AC
  Filled 2018-03-08: qty 1

## 2018-03-08 MED ORDER — LIDOCAINE HCL (PF) 2 % IJ SOLN
INTRAMUSCULAR | Status: AC
  Filled 2018-03-08: qty 10

## 2018-03-08 MED ORDER — STERILE WATER FOR INJECTION IJ SOLN
INTRAMUSCULAR | Status: AC
  Filled 2018-03-08: qty 10

## 2018-03-08 MED ORDER — CHLORHEXIDINE GLUCONATE CLOTH 2 % EX PADS: 6.0000 | MEDICATED_PAD | Freq: Once | CUTANEOUS | Status: DC

## 2018-03-08 MED ORDER — FENTANYL CITRATE (PF) 100 MCG/2ML IJ SOLN
INTRAMUSCULAR | Status: DC | PRN
  Administered 2018-03-08 (×2): 50 ug via INTRAVENOUS

## 2018-03-08 MED ORDER — OXYCODONE HCL 5 MG/5ML PO SOLN: 5.0000 mg | Freq: Once | ORAL | Status: DC | PRN

## 2018-03-08 MED ORDER — PROPOFOL 10 MG/ML IV BOLUS
INTRAVENOUS | Status: DC | PRN
  Administered 2018-03-08: 140 mg via INTRAVENOUS

## 2018-03-08 MED ORDER — PROPOFOL 10 MG/ML IV BOLUS
INTRAVENOUS | Status: AC
  Filled 2018-03-08: qty 20

## 2018-03-08 MED ORDER — GLYCOPYRROLATE 0.2 MG/ML IJ SOLN
INTRAMUSCULAR | Status: DC | PRN
  Administered 2018-03-08: 0.2 mg via INTRAVENOUS

## 2018-03-08 MED ORDER — EPHEDRINE SULFATE 50 MG/ML IJ SOLN
INTRAMUSCULAR | Status: DC | PRN
  Administered 2018-03-08 (×4): 10 mg via INTRAVENOUS

## 2018-03-08 MED ORDER — SEVOFLURANE IN SOLN
RESPIRATORY_TRACT | Status: AC
  Filled 2018-03-08: qty 250

## 2018-03-08 MED ORDER — ONDANSETRON HCL 4 MG/2ML IJ SOLN
INTRAMUSCULAR | Status: AC
  Filled 2018-03-08: qty 2

## 2018-03-08 MED ORDER — HYDROCODONE-ACETAMINOPHEN 5-325 MG PO TABS
ORAL_TABLET | ORAL | Status: AC
  Filled 2018-03-08: qty 1

## 2018-03-08 MED ORDER — CELECOXIB 200 MG PO CAPS
ORAL_CAPSULE | ORAL | Status: AC
  Administered 2018-03-08: 200 mg via ORAL
  Filled 2018-03-08: qty 1

## 2018-03-08 MED ORDER — FAMOTIDINE 20 MG PO TABS
20.0000 mg | ORAL_TABLET | Freq: Once | ORAL | Status: AC
  Administered 2018-03-08: 20 mg via ORAL

## 2018-03-08 MED ORDER — SUGAMMADEX SODIUM 200 MG/2ML IV SOLN
INTRAVENOUS | Status: AC
  Filled 2018-03-08: qty 2

## 2018-03-08 MED ORDER — HYDROCODONE-ACETAMINOPHEN 5-325 MG PO TABS
1.0000 | ORAL_TABLET | Freq: Four times a day (QID) | ORAL | Status: DC | PRN
  Administered 2018-03-08: 1 via ORAL

## 2018-03-08 MED ORDER — FENTANYL CITRATE (PF) 100 MCG/2ML IJ SOLN
25.0000 ug | INTRAMUSCULAR | Status: DC | PRN
  Administered 2018-03-08 (×2): 50 ug via INTRAVENOUS

## 2018-03-08 MED ORDER — LACTATED RINGERS IV SOLN
INTRAVENOUS | Status: DC
  Administered 2018-03-08: 06:00:00 via INTRAVENOUS

## 2018-03-08 MED ORDER — DEXAMETHASONE SODIUM PHOSPHATE 10 MG/ML IJ SOLN
INTRAMUSCULAR | Status: DC | PRN
  Administered 2018-03-08: 10 mg via INTRAVENOUS

## 2018-03-08 MED ORDER — MEPERIDINE HCL 50 MG/ML IJ SOLN: 6.2500 mg | INTRAMUSCULAR | Status: DC | PRN

## 2018-03-08 MED ORDER — OXYCODONE HCL 5 MG PO TABS: 5.0000 mg | ORAL_TABLET | Freq: Once | ORAL | Status: DC | PRN

## 2018-03-08 MED ORDER — ONDANSETRON HCL 4 MG/2ML IJ SOLN
INTRAMUSCULAR | Status: DC | PRN
  Administered 2018-03-08: 4 mg via INTRAVENOUS

## 2018-03-08 MED ORDER — LACTATED RINGERS IV SOLN
INTRAVENOUS | Status: DC | PRN
  Administered 2018-03-08: 08:00:00 via INTRAVENOUS

## 2018-03-08 MED ORDER — EPHEDRINE SULFATE 50 MG/ML IJ SOLN
INTRAMUSCULAR | Status: AC
  Filled 2018-03-08: qty 1

## 2018-03-08 MED ORDER — ACETAMINOPHEN 500 MG PO TABS
ORAL_TABLET | ORAL | Status: AC
  Filled 2018-03-08: qty 1

## 2018-03-08 SURGICAL SUPPLY — 45 items
"PENCIL ELECTRO HAND CTR " (MISCELLANEOUS) ×1 IMPLANT
ADH SKN CLS APL DERMABOND .7 (GAUZE/BANDAGES/DRESSINGS) ×1
BAG SPEC RTRVL LRG 6X4 10 (ENDOMECHANICALS) ×1
CANISTER SUCT 1200ML W/VALVE (MISCELLANEOUS) ×3 IMPLANT
CHLORAPREP W/TINT 26ML (MISCELLANEOUS) ×3 IMPLANT
CLIP VESOLOCK MED LG 6/CT (CLIP) ×6 IMPLANT
CORD BIP STRL DISP 12FT (MISCELLANEOUS) ×3 IMPLANT
COVER TIP SHEARS 8 DVNC (MISCELLANEOUS) IMPLANT
COVER TIP SHEARS 8MM DA VINCI (MISCELLANEOUS) ×2
COVER WAND RF STERILE (DRAPES) ×3 IMPLANT
DECANTER SPIKE VIAL GLASS SM (MISCELLANEOUS) ×1 IMPLANT
DEFOGGER SCOPE WARMER CLEARIFY (MISCELLANEOUS) ×3 IMPLANT
DERMABOND ADVANCED (GAUZE/BANDAGES/DRESSINGS) ×2
DERMABOND ADVANCED .7 DNX12 (GAUZE/BANDAGES/DRESSINGS) ×1 IMPLANT
DRAPE SHEET LG 3/4 BI-LAMINATE (DRAPES) ×3 IMPLANT
ELECT REM PT RETURN 9FT ADLT (ELECTROSURGICAL) ×3
ELECTRODE REM PT RTRN 9FT ADLT (ELECTROSURGICAL) ×1 IMPLANT
GAUZE SPONGE 4X4 16PLY XRAY LF (GAUZE/BANDAGES/DRESSINGS) ×2 IMPLANT
GLOVE BIO SURGEON STRL SZ7 (GLOVE) ×10 IMPLANT
GOWN STRL REUS W/ TWL LRG LVL3 (GOWN DISPOSABLE) ×4 IMPLANT
GOWN STRL REUS W/TWL LRG LVL3 (GOWN DISPOSABLE) ×9
IRRIGATION STRYKERFLOW (MISCELLANEOUS) IMPLANT
IRRIGATOR STRYKERFLOW (MISCELLANEOUS)
IV NS 1000ML (IV SOLUTION) ×3
IV NS 1000ML BAXH (IV SOLUTION) ×1 IMPLANT
KIT ACCESSORY DA VINCI DISP (KITS) ×2
KIT ACCESSORY DVNC DISP (KITS) ×1 IMPLANT
KIT PINK PAD W/HEAD ARE REST (MISCELLANEOUS) ×3
KIT PINK PAD W/HEAD ARM REST (MISCELLANEOUS) ×1 IMPLANT
LABEL OR SOLS (LABEL) ×3 IMPLANT
NEEDLE HYPO 22GX1.5 SAFETY (NEEDLE) ×3 IMPLANT
NS IRRIG 500ML POUR BTL (IV SOLUTION) ×3 IMPLANT
PACK LAP CHOLECYSTECTOMY (MISCELLANEOUS) ×3 IMPLANT
PENCIL ELECTRO HAND CTR (MISCELLANEOUS) ×3 IMPLANT
POUCH SPECIMEN RETRIEVAL 10MM (ENDOMECHANICALS) ×3 IMPLANT
PROGRASP ENDOWRIST DA VINCI (INSTRUMENTS)
PROGRASP ENDOWRIST DVNC (INSTRUMENTS) IMPLANT
SOLUTION ELECTROLUBE (MISCELLANEOUS) ×3 IMPLANT
SPONGE LAP 18X18 RF (DISPOSABLE) ×3 IMPLANT
STRAP SAFETY 5IN WIDE (MISCELLANEOUS) ×3 IMPLANT
SUT MNCRL AB 4-0 PS2 18 (SUTURE) ×3 IMPLANT
SUT VICRYL 0 AB UR-6 (SUTURE) ×6 IMPLANT
TROCAR 130MM GELPORT  DAV (MISCELLANEOUS) ×3 IMPLANT
TROCAR XCEL NON-BLD 5MMX100MML (ENDOMECHANICALS) ×3 IMPLANT
TUBING INSUF HEATED (TUBING) ×3 IMPLANT

## 2018-03-08 NOTE — OR Nursing (Signed)
Pt c/o "feels like somethings in my right eye" approx 1130, no redness or drainage noted, saline drops administered several times throughout approx 45 min time span afterwards, advised "feels a little bit better".  Encouraged to get up to bathroom to rinse with water, advises "i'll do it at home, it feels better now".  Instructed to contact eye doctor if not resolved by tomorrow, advises "I will."

## 2018-03-08 NOTE — Telephone Encounter (Signed)
Faxed yesterday afternoon.

## 2018-03-08 NOTE — OR Nursing (Signed)
Dr Dahlia Byes aware of patient is allergic to shellfish but has had iv contrast/dye with no issues.  Orders received to proceed with [facility]

## 2018-03-08 NOTE — Op Note (Signed)
Robotic assisted Laparoscopic Cholecystectomy  Pre-operative Diagnosis: Chronic cholecystitis, hx choledocholithiasis  Post-operative Diagnosis: same  Procedure: Robotic assisted laparoscopic cholecystectomy  Surgeon: Caroleen Hamman, MD FACS  Anesthesia: Gen. with endotracheal tube  Findings: Chronic Cholecystitis  Short cystic duct. No evidence of bile leak or abnormal biliary anatomy by ICG cholangiography.  Estimated Blood Loss: 10cc                  Specimens: Gallbladder           Complications: none   Procedure Details  The patient was seen again in the Holding Room. The benefits, complications, treatment options, and expected outcomes were discussed with the patient. The risks of bleeding, infection, recurrence of symptoms, failure to resolve symptoms, bile duct damage, bile duct leak, retained common bile duct stone, bowel injury, any of which could require further surgery and/or ERCP, stent, or papillotomy were reviewed with the patient. The likelihood of improving the patient's symptoms with return to their baseline status is good.  The patient and/or family concurred with the proposed plan, giving informed consent.  The patient was taken to Operating Room, identified as Derinda Late and the procedure verified as Laparoscopic Cholecystectomy.  A Time Out was held and the above information confirmed.  Prior to the induction of general anesthesia, antibiotic prophylaxis was administered. VTE prophylaxis was in place. General endotracheal anesthesia was then administered and tolerated well. After the induction, the abdomen was prepped with Chloraprep and draped in the sterile fashion. The patient was positioned in the supine position.  Cut down technique was used to enter the abdominal cavity and a Hasson trochar was placed after two vicryl stitches were anchored to the fascia. Pneumoperitoneum was then created with CO2 and tolerated well without any adverse changes in the  patient's vital signs.  Three 8-mm ports were placed  under direct vision. All skin incisions  were infiltrated with a local anesthetic agent before making the incision and placing the trocars.   The patient was positioned  in reverse Trendelenburg.  The robot was brought into the field and docked in the standard fashion. I scrubbed out and went to the console.  The gallbladder was identified, the fundus grasped and retracted cephalad. Adhesions were lysed bluntly. The infundibulum was grasped and retracted laterally, exposing the peritoneum overlying the triangle of Calot. This was then divided and exposed in a blunt fashion. An extended critical view of the cystic duct and cystic artery was obtained.  The cystic duct was clearly identified and bluntly dissected.   Artery and duct were double clipped and divided.  The gallbladder was taken from the gallbladder fossa in a retrograde fashion with the electrocautery. Hemostasis was achieved with the electrocautery.  We remove all over the laparoscopic and robotic instruments and undocked the robot in the standard fashion.  The gallbladder was removed and placed in an Endocatch bag.    Inspection of the right upper quadrant was performed. No bleeding, bile duct injury or leak, or bowel injury was noted. Pneumoperitoneum was released.  The periumbilical port site was closed with interrumpted 0 Vicryl sutures. 4-0 subcuticular Monocryl was used to close the skin. Dermabond was  applied.  The patient was then extubated and brought to the recovery room in stable condition. Sponge, lap, and needle counts were correct at closure and at the conclusion of the case.               Caroleen Hamman, MD, FACS

## 2018-03-08 NOTE — Anesthesia Procedure Notes (Signed)
Procedure Name: Intubation Date/Time: 03/08/2018 8:05 AM Performed by: Aline Brochure, CRNA Pre-anesthesia Checklist: Patient identified, Emergency Drugs available, Suction available and Patient being monitored Patient Re-evaluated:Patient Re-evaluated prior to induction Oxygen Delivery Method: Circle system utilized Preoxygenation: Pre-oxygenation with 100% oxygen Induction Type: IV induction Ventilation: Mask ventilation without difficulty Laryngoscope Size: Mac and 3 Grade View: Grade I Tube type: Oral Tube size: 7.0 mm Number of attempts: 1 Airway Equipment and Method: Stylet Placement Confirmation: ETT inserted through vocal cords under direct vision,  positive ETCO2 and breath sounds checked- equal and bilateral Secured at: 22 cm Tube secured with: Tape Dental Injury: Teeth and Oropharynx as per pre-operative assessment

## 2018-03-08 NOTE — Anesthesia Post-op Follow-up Note (Signed)
Anesthesia QCDR form completed.        

## 2018-03-08 NOTE — Discharge Instructions (Signed)

## 2018-03-08 NOTE — Progress Notes (Signed)
Patient is afraid she may have nausea, doesn't want to overdo pain medication.

## 2018-03-08 NOTE — Transfer of Care (Signed)
Immediate Anesthesia Transfer of Care Note  Patient: Derinda Late  Procedure(s) Performed: ROBOTIC ASSISTED LAPAROSCOPIC CHOLECYSTECTOMY-MULTI SITE (N/A )  Patient Location: PACU  Anesthesia Type:General  Level of Consciousness: sedated  Airway & Oxygen Therapy: Patient connected to face mask oxygen  Post-op Assessment: Post -op Vital signs reviewed and stable  Post vital signs: stable  Last Vitals:  Vitals Value Taken Time  BP 137/70 03/08/2018  9:52 AM  Temp    Pulse 59 03/08/2018  9:52 AM  Resp 15 03/08/2018  9:52 AM  SpO2 97 % 03/08/2018  9:52 AM  Vitals shown include unvalidated device data.  Last Pain:  Vitals:   03/08/18 0601  TempSrc: Tympanic  PainSc: 0-No pain         Complications: No apparent anesthesia complications

## 2018-03-08 NOTE — Anesthesia Postprocedure Evaluation (Signed)
Anesthesia Post Note  [name]  Procedure(s) Performed: ROBOTIC ASSISTED LAPAROSCOPIC CHOLECYSTECTOMY-MULTI SITE (N/A )  Patient location during evaluation: PACU Anesthesia Type: General Level of consciousness: awake and alert and oriented Pain management: pain level controlled Vital Signs Assessment: post-procedure vital signs reviewed and stable Respiratory status: spontaneous breathing, nonlabored ventilation and respiratory function stable Cardiovascular status: blood pressure returned to baseline and stable Postop Assessment: no signs of nausea or vomiting Anesthetic complications: no     Last Vitals:  Vitals:   03/08/18 1040 03/08/18 1152  BP: (!) 142/75 (!) 140/53  Pulse: (!) 55 (!) 55  Resp:  16  Temp:  (!) 36.3 C  SpO2: 90% 98%    Last Pain:  Vitals:   03/08/18 1227  TempSrc:   PainSc: 4                  Kariem Wolfson

## 2018-03-08 NOTE — Anesthesia Preprocedure Evaluation (Signed)
Anesthesia Evaluation  Patient identified by MRN, date of birth, ID band Patient awake    Reviewed: Allergy & Precautions, NPO status , Patient's Chart, lab work & pertinent test results  History of Anesthesia Complications Negative for: history of anesthetic complications  Airway Mallampati: III  TM Distance: >3 FB Neck ROM: Full    Dental no notable dental hx.    Pulmonary neg pulmonary ROS, neg sleep apnea, neg COPD,    breath sounds clear to auscultation- rhonchi (-) wheezing      Cardiovascular Exercise Tolerance: Good hypertension, Pt. on medications (-) CAD, (-) Past MI, (-) Cardiac Stents and (-) CABG  Rhythm:Regular Rate:Normal - Systolic murmurs and - Diastolic murmurs    Neuro/Psych  Headaches, negative psych ROS   GI/Hepatic Neg liver ROS, PUD, GERD  ,  Endo/Other  neg diabetesHypothyroidism   Renal/GU negative Renal ROS     Musculoskeletal  (+) Arthritis ,   Abdominal (+) + obese,   Peds  Hematology negative hematology ROS (+)   Anesthesia Other Findings Past Medical History: No date: Allergy     Comment:  some No date: Cataract     Comment:  bilaterally both eyes  No date: GERD (gastroesophageal reflux disease) No date: Hydrocephalus ([place])     Comment:  [facility]  No date: Hyperglycemia No date: Hyperlipidemia No date: Hypertension No date: Lymphocytosis     Comment:  monoclonal b cell 12/14/2012: Migraine, unspecified, without mention of intractable  migraine without mention of status migrainosus No date: Osteoarthritis, chronic No date: Osteoporosis No date: Peptic ulcer     Comment:  some bleeding with ulcers as well    Reproductive/Obstetrics                             Anesthesia Physical Anesthesia Plan  ASA: III  Anesthesia Plan: General   Post-op Pain Management:    Induction: Intravenous  PONV Risk Score and Plan: 2 and Dexamethasone,  Ondansetron and Midazolam  Airway Management Planned: Oral ETT  Additional Equipment:   Intra-op Plan:   Post-operative Plan: Extubation in OR  Informed Consent: I have reviewed the patients History and Physical, chart, labs and discussed the procedure including the risks, benefits and alternatives for the proposed anesthesia with the patient or authorized representative who has indicated his/her understanding and acceptance.   Dental advisory given  Plan Discussed with: CRNA and Anesthesiologist  Anesthesia Plan Comments:         Anesthesia Quick Evaluation

## 2018-03-08 NOTE — Interval H&P Note (Signed)
History and Physical Interval Note:  03/08/2018 7:17 AM  Cathy Barnes  has presented today for surgery, with the diagnosis of biliary colic  The various methods of treatment have been discussed with the patient and family. After consideration of risks, benefits and other options for treatment, the patient has consented to  Procedure(s): ROBOTIC [place] (N/A) as a surgical intervention .  The patient's history has been reviewed, patient examined, no change in status, stable for surgery.  I have reviewed the patient's chart and labs.  Questions were answered to the patient's satisfaction.     [place]

## 2018-03-09 ENCOUNTER — Encounter: Payer: Self-pay | Admitting: Surgery

## 2018-03-09 LAB — SURGICAL PATHOLOGY

## 2018-03-15 ENCOUNTER — Other Ambulatory Visit: Payer: Self-pay | Admitting: Neurology

## 2018-03-23 ENCOUNTER — Encounter: Payer: Self-pay | Admitting: Surgery

## 2018-03-23 ENCOUNTER — Other Ambulatory Visit: Payer: Self-pay

## 2018-03-23 ENCOUNTER — Ambulatory Visit (INDEPENDENT_AMBULATORY_CARE_PROVIDER_SITE_OTHER): Payer: Medicare Other | Admitting: Surgery

## 2018-03-23 VITALS — BP 130/72 | HR 70 | Temp 97.9°F | Resp 13 | Ht 65.0 in | Wt 193.0 lb

## 2018-03-23 DIAGNOSIS — G588 Other specified mononeuropathies: Secondary | ICD-10-CM | POA: Diagnosis not present

## 2018-03-23 DIAGNOSIS — Z09 Encounter for follow-up examination after completed treatment for conditions other than malignant neoplasm: Secondary | ICD-10-CM

## 2018-03-23 MED ORDER — TRIAMCINOLONE ACETONIDE 40 MG/ML IJ SUSP
40.0000 mg | Freq: Once | INTRAMUSCULAR | Status: AC
  Administered 2018-03-23: 40 mg via INTRAMUSCULAR

## 2018-03-23 MED ORDER — GABAPENTIN 600 MG PO TABS: 600.0000 mg | ORAL_TABLET | Freq: Three times a day (TID) | ORAL | 3 refills | Status: DC

## 2018-03-23 NOTE — Progress Notes (Signed)
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 Offered her IC nerve block. She is in so much pain that she wishes to have the block done ASAP. Procedure d/ with patient detail.  Risk benefit and possible indications and consent was obtained.   PROCEDURE NOTE DX: intercostal neuralgia right  Procedure: 5,6,7th intercostal nerve block right side  Anesthesia: 10cc lidocaine 1% w epi, 10cc marcaine .25% and 40 mg kenalog  Patient was prepped and draped in the sterile fashion  the 5th  6th and 7th ribs were identified and were infiltrated with the anesthetic.  We were able to perform the block without any issue and the patient had spectacular results after the procedure was performed. Pain subsided

## 2018-03-23 NOTE — Patient Instructions (Addendum)
Rx sent to pharmacy , Return as needed.

## 2018-04-07 LAB — HM MAMMOGRAPHY

## 2018-05-09 ENCOUNTER — Telehealth: Payer: Self-pay | Admitting: Physician Assistant

## 2018-05-09 NOTE — Telephone Encounter (Signed)
I left a message asking the pt to call and schedule AWV. VDM (DD)

## 2018-05-22 ENCOUNTER — Other Ambulatory Visit: Payer: Self-pay | Admitting: Physician Assistant

## 2018-05-22 DIAGNOSIS — G4700 Insomnia, unspecified: Secondary | ICD-10-CM

## 2018-05-24 ENCOUNTER — Other Ambulatory Visit: Payer: Self-pay | Admitting: Physician Assistant

## 2018-05-24 DIAGNOSIS — E559 Vitamin D deficiency, unspecified: Secondary | ICD-10-CM

## 2018-05-30 ENCOUNTER — Telehealth: Payer: Self-pay | Admitting: Physician Assistant

## 2018-05-30 DIAGNOSIS — F5101 Primary insomnia: Secondary | ICD-10-CM

## 2018-05-30 NOTE — Telephone Encounter (Signed)
Pt called saying that she needs a refill on    Zolpidem 10 mg  She said the pharmacy told her to check with the provider to make sure she needed them  Patient says she needs them to sleep  CB# [phone]  Thanks Con Memos

## 2018-05-30 NOTE — Telephone Encounter (Signed)
Spoke with pharmacy and patient needs a prior authorization for medication. I called patient back and let her know that a PA has to be done in order for her to receive medication. Patient is aware that the PA can take up to 48 hours for approval.

## 2018-06-01 MED ORDER — RAMELTEON 8 MG PO TABS: 8.0000 mg | ORAL_TABLET | Freq: Every day | ORAL | 0 refills | Status: DC

## 2018-06-01 NOTE — Telephone Encounter (Signed)
PA was denied yesterday. Please advise?

## 2018-06-01 NOTE — Telephone Encounter (Signed)
Pt called back wanting to know the status of the prescription PA  Thanks teri

## 2018-06-01 NOTE — Addendum Note (Signed)
Addended by: Mar Daring on: 06/01/2018 03:37 PM   Modules accepted: Orders

## 2018-06-01 NOTE — Telephone Encounter (Signed)
Patient was advised. Patient has tried samples of Belsomra for 2 weeks and is willing to try Rozerem.

## 2018-06-01 NOTE — Telephone Encounter (Signed)
They are wanting her to try rozerem and belsomra before allowing ambien to be used. Has she tried these before?

## 2018-06-01 NOTE — Telephone Encounter (Signed)
Rozerem sent in

## 2018-06-03 ENCOUNTER — Other Ambulatory Visit: Payer: Self-pay | Admitting: Physician Assistant

## 2018-06-03 DIAGNOSIS — N3281 Overactive bladder: Secondary | ICD-10-CM

## 2018-06-06 ENCOUNTER — Telehealth: Payer: Self-pay | Admitting: Physician Assistant

## 2018-06-06 ENCOUNTER — Encounter: Payer: Self-pay | Admitting: Physician Assistant

## 2018-06-06 ENCOUNTER — Ambulatory Visit: Payer: Medicare Other | Admitting: Oncology

## 2018-06-06 ENCOUNTER — Ambulatory Visit: Payer: Medicare Other | Admitting: Physician Assistant

## 2018-06-06 ENCOUNTER — Other Ambulatory Visit: Payer: Medicare Other

## 2018-06-06 VITALS — BP 125/67 | HR 67 | Wt 195.8 lb

## 2018-06-06 DIAGNOSIS — F5101 Primary insomnia: Secondary | ICD-10-CM | POA: Diagnosis not present

## 2018-06-06 DIAGNOSIS — Z9049 Acquired absence of other specified parts of digestive tract: Secondary | ICD-10-CM | POA: Diagnosis not present

## 2018-06-06 DIAGNOSIS — G588 Other specified mononeuropathies: Secondary | ICD-10-CM | POA: Diagnosis not present

## 2018-06-06 MED ORDER — GABAPENTIN 600 MG PO TABS: ORAL_TABLET | ORAL | 3 refills | Status: DC

## 2018-06-06 MED ORDER — ZOLPIDEM TARTRATE 10 MG PO TABS: 10.0000 mg | ORAL_TABLET | Freq: Every day | ORAL | 1 refills | Status: DC

## 2018-06-06 NOTE — Addendum Note (Signed)
Addended by: Mar Daring on: 06/06/2018 01:53 PM   Modules accepted: Orders

## 2018-06-06 NOTE — Telephone Encounter (Signed)
Pt wanted to leave Cathy Barnes the info of where she is going for the bra shop.  Second to [place]   [place]  Thanks, [place]

## 2018-06-06 NOTE — Progress Notes (Addendum)
[name] Female    DOB: [birth year]   78 y.o.   MRN: [id number] Visit Date: 06/06/2018  Today's Provider: Mar Daring, PA-C   Chief Complaint  Patient presents with  . Follow-up    Cholecystectomy   Subjective:     HPI  Cholecystectomy, Follow up:  The patient was last seen for this 2 months ago. Changes made since that visit include robotic assisted laparoscopic cholecystectomy and on 03/23/18 patient with intercostal neuralgia it was discuss with the pt about increasing dose gabapentin and NSAIDS. Offered her intercostal nerve block. She was in so much pain that she wished to have the block done in the office. She reports it helped that day, but once numbing agent wore off the pain was immediately back.   She reports this pain started following a lumpectomy of the right breast done at [facility] many years ago. She was sent to a pain clinic at had multiple nerve blocks and given a TENS unit, without relief, in 2008. Pain has been worsening recently. She is using gabapentin but is limited at dose increases due to adverse effects.  She reports fair compliance with treatment. She is not having side effects.  ------------------------------------------------------------------------    Allergies  Allergen Reactions  . Shellfish Allergy     Throat closes, rash  . Latex Rash    Tapes,adhesives     Current Outpatient Medications:  .  ALPRAZolam (XANAX) 0.25 MG tablet, TAKE 2 TABLETS BY MOUTH DAILY (Patient taking differently: Take 0.5 mg by mouth at bedtime. ), Disp: 60 tablet, Rfl: 5 .  atenolol (TENORMIN) 50 MG tablet, TAKE 1 TABLET BY MOUTH ONCE DAILY, Disp: 90 tablet, Rfl: 1 .  Cholecalciferol (VITAMIN D3) 1.25 MG (50000 UT) CAPS, TAKE 1 CAPSULE BY MOUTH EVERY MONTH AS DIRECTED, Disp: 4 capsule, Rfl: 5 .  cyclobenzaprine (FLEXERIL) 10 MG tablet, TAKE 1 TABLET(10 MG) BY MOUTH AT BEDTIME (Patient taking differently: Take 10 mg by mouth at bedtime. ), Disp:  30 tablet, Rfl: 5 .  donepezil (ARICEPT) 10 MG tablet, Take 1 tablet (10 mg total) by mouth at bedtime., Disp: 90 tablet, Rfl: 6 .  ezetimibe (ZETIA) 10 MG tablet, TAKE 1 TABLET BY MOUTH AT BEDTIME, Disp: 30 tablet, Rfl: 5 .  gabapentin (NEURONTIN) 600 MG tablet, Take 1/2 tab (300mg ) PO q breakfast, 1/2 tab (300mg ) PO q lunch and 1 full tab (600mg ) q hs, Disp: 60 tablet, Rfl: 3 .  levothyroxine (SYNTHROID, LEVOTHROID) 100 MCG tablet, Take 1 tablet (100 mcg total) by mouth daily. (Patient taking differently: Take 100 mcg by mouth daily before breakfast. ), Disp: 90 tablet, Rfl: 3 .  losartan (COZAAR) 50 MG tablet, TAKE 1 TABLET BY MOUTH AT BEDTIME, Disp: 90 tablet, Rfl: 1 .  Magnesium 250 MG TABS, Take 1 tablet by mouth daily., Disp: , Rfl:  .  potassium chloride SA (K-DUR,KLOR-CON) 20 MEQ tablet, take 1 tablet by mouth twice a day (Patient taking differently: Take 20 mEq by mouth 2 (two) times daily. ), Disp: 180 tablet, Rfl: 1 .  solifenacin (VESICARE) 10 MG tablet, TAKE 1 TABLET BY MOUTH ONCE DAILY, Disp: 90 tablet, Rfl: 4 .  triamterene-hydrochlorothiazide (MAXZIDE) 75-50 MG tablet, TAKE 1/2 TABLET BY MOUTH DAILY, Disp: 90 tablet, Rfl: 1 .  verapamil (VERELAN PM) 120 MG 24 hr capsule, TAKE 1 CAPSULE BY MOUTH ONCE DAILY, Disp: 90 capsule, Rfl: 0 .  zolpidem (AMBIEN) 10 MG tablet, Take 1 tablet (10 mg  total) by mouth at bedtime., Disp: 90 tablet, Rfl: 1 .  aspirin 81 MG tablet, Take 81 mg by mouth daily., Disp: , Rfl:   Review of Systems  Constitutional: Negative.   HENT: Negative.   Respiratory: Negative.   Cardiovascular: Positive for chest pain (right sided rib). Negative for palpitations and leg swelling.  Gastrointestinal: Negative for abdominal pain, constipation, diarrhea and nausea.  Genitourinary: Negative.   Neurological: Negative.     Social History   Tobacco Use  . Smoking status: Never Smoker  . Smokeless tobacco: Never Used  Substance Use Topics  . Alcohol use: Yes     Alcohol/week: 1.0 standard drinks    Types: 1 Glasses of wine per week    Comment: Rare- wine on occasion      Objective:   BP 125/67 (BP Location: Left Arm, Patient Position: Sitting, Cuff Size: Normal)   Pulse 67   Wt 195 lb 12.8 oz (88.8 kg)   BMI 32.58 kg/m  Vitals:   06/06/18 1102  BP: 125/67  Pulse: 67  Weight: 195 lb 12.8 oz (88.8 kg)     Physical Exam Vitals signs reviewed.  Constitutional:      General: She is not in acute distress.    Appearance: She is well-developed. She is not diaphoretic.  Neck:     Musculoskeletal: Normal range of motion and neck supple.  Cardiovascular:     Rate and Rhythm: Normal rate and regular rhythm.     Heart sounds: Normal heart sounds. No murmur. No friction rub. No gallop.   Pulmonary:     Effort: Pulmonary effort is normal. No respiratory distress.     Breath sounds: Normal breath sounds. No wheezing or rales.  Neurological:     Mental Status: She is alert.        Assessment & Plan    1. Intercostal neuralgia Updated dosing of how she is taking gabapentin and tolerating better. Failed nerve block. I have sent a message to Dr. Holley Raring, Pain Management, to see if she would be a candidate for cryoablation. If so, I will place referral.  - gabapentin (NEURONTIN) 600 MG tablet; Take 1/2 tab (300mg ) PO q breakfast, 1/2 tab (300mg ) PO q lunch and 1 full tab (600mg ) q hs  Dispense: 60 tablet; Refill: 3  2. Primary insomnia Patient has tried Belsomra in the past x 2 weeks, and recently tried Rozerem without success. Neither medication helped her fall asleep or sustain sleep.  Diagnosis pulled for medication refill. Continue current medical treatment plan. - zolpidem (AMBIEN) 10 MG tablet; Take 1 tablet (10 mg total) by mouth at bedtime.  Dispense: 90 tablet; Refill: 1  3. S/P laparoscopic cholecystectomy Feels she is doing well from this standpoint. Still having intercostal neuralgia pains as noted above.      Mar Daring,  PA-C  [facility]

## 2018-06-08 NOTE — Telephone Encounter (Signed)
faxed

## 2018-06-08 NOTE — Telephone Encounter (Signed)
Rx written to be faxed

## 2018-06-12 ENCOUNTER — Other Ambulatory Visit: Payer: Self-pay | Admitting: Neurology

## 2018-06-14 ENCOUNTER — Inpatient Hospital Stay: Payer: Medicare Other | Attending: Oncology

## 2018-06-14 ENCOUNTER — Other Ambulatory Visit: Payer: Self-pay

## 2018-06-14 ENCOUNTER — Inpatient Hospital Stay: Payer: Medicare Other | Admitting: Oncology

## 2018-06-14 VITALS — BP 184/79 | HR 66 | Temp 96.8°F | Resp 18 | Wt 197.1 lb

## 2018-06-14 DIAGNOSIS — D721 Eosinophilia, unspecified: Secondary | ICD-10-CM

## 2018-06-14 DIAGNOSIS — Z7982 Long term (current) use of aspirin: Secondary | ICD-10-CM

## 2018-06-14 DIAGNOSIS — C9110 Chronic lymphocytic leukemia of B-cell type not having achieved remission: Secondary | ICD-10-CM | POA: Diagnosis present

## 2018-06-14 DIAGNOSIS — Z79899 Other long term (current) drug therapy: Secondary | ICD-10-CM | POA: Insufficient documentation

## 2018-06-14 DIAGNOSIS — E039 Hypothyroidism, unspecified: Secondary | ICD-10-CM | POA: Insufficient documentation

## 2018-06-14 DIAGNOSIS — I10 Essential (primary) hypertension: Secondary | ICD-10-CM

## 2018-06-14 LAB — CBC WITH DIFFERENTIAL/PLATELET
ABS IMMATURE GRANULOCYTES: 0.06 10*3/uL (ref 0.00–0.07)
BASOS PCT: 1 %
Basophils Absolute: 0.1 10*3/uL (ref 0.0–0.1)
EOS ABS: 1.4 10*3/uL — AB (ref 0.0–0.5)
EOS PCT: 11 %
HCT: 39.1 % (ref 36.0–46.0)
Hemoglobin: 12.7 g/dL (ref 12.0–15.0)
Immature Granulocytes: 1 %
Lymphocytes Relative: 52 %
Lymphs Abs: 6.8 10*3/uL — ABNORMAL HIGH (ref 0.7–4.0)
MCH: 30 pg (ref 26.0–34.0)
MCHC: 32.5 g/dL (ref 30.0–36.0)
MCV: 92.2 fL (ref 80.0–100.0)
MONO ABS: 0.7 10*3/uL (ref 0.1–1.0)
MONOS PCT: 6 %
NEUTROS ABS: 3.8 10*3/uL (ref 1.7–7.7)
Neutrophils Relative %: 29 %
PLATELETS: 265 10*3/uL (ref 150–400)
RBC: 4.24 MIL/uL (ref 3.87–5.11)
RDW: 14.2 % (ref 11.5–15.5)
WBC: 12.8 10*3/uL — AB (ref 4.0–10.5)
nRBC: 0 % (ref 0.0–0.2)

## 2018-06-14 NOTE — Progress Notes (Signed)
Here for follow up. Overall per pt " I feel pretty good "

## 2018-06-20 ENCOUNTER — Encounter: Payer: Self-pay | Admitting: Oncology

## 2018-06-20 NOTE — Progress Notes (Signed)
Hematology/Oncology Consult note [facility]  [phone] [phone]  Patient Care Team: Rubye Beach as PCP - General (Family Medicine) Monna Fam, MD as Consulting Physician (Ophthalmology) [name], MD as Consulting Physician (Neurology)   Name of the [name]  [id number]  11/12/1940   Date of visit: 06/20/18  Diagnosis- 1.  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  Moderate eosinophilia possibly secondary to CLL etiology unclear  Chief complaint/ Reason for visit-routine follow-up of CLL  Heme/Onc history: patient is a 78 year old female with a past medical history significant for hypertension, vitamin D deficiency and hypothyroidism. She has been sent to [place] for evaluation of leukocytosis. Over the last 1 year patient's white count has been around 12 with predominantly lymphocytosis and monocytosis as well as eosinophilia. No evidence of anemia or thrombocytopenia. Overall patient is doing well and denies any complaints of fevers, chills, unintentional weight loss, fatigue or night sweats. Denies any lumps or bumps anywhere  Further blood work from 07-2016 was as follows: CBC showed white count of 12.9 with an absolute lymphocyte count of 6.6 and an eosinophilia of 1.1. H&H was 13.4/41.6 and a platelet count of 270. CMP was unremarkable. Review of peripheral smear showed mild leukocytosis with absolute lymphocytosis and eosinophilia. BCR able testing was negative for CML. Flow cytometry showed CD5 positive, CD23 positive clonal B-cell population CLL/SLL phenotype CD38 negative. 36% of leukocytes are less than 5000/mcL. eosinophilia   Interval history-she feels well overall.  Denies any fatigue, unintentional weight loss, drenching night sweats or lumps or bumps anywhere.  Her appetite is stable.  Denies any recurrent hospitalizations or infections.  Denies any cough shortness of breath or leg  swelling  ECOG PS- 1 Pain scale- 0   Review of systems- Review of Systems  Constitutional: Negative for chills, fever, malaise/fatigue and weight loss.  HENT: Negative for congestion, ear discharge and nosebleeds.   Eyes: Negative for blurred vision.  Respiratory: Negative for cough, hemoptysis, sputum production, shortness of breath and wheezing.   Cardiovascular: Negative for chest pain, palpitations, orthopnea and claudication.  Gastrointestinal: Negative for abdominal pain, blood in stool, constipation, diarrhea, heartburn, melena, nausea and vomiting.  Genitourinary: Negative for dysuria, flank pain, frequency, hematuria and urgency.  Musculoskeletal: Negative for back pain, joint pain and myalgias.  Skin: Negative for rash.  Neurological: Negative for dizziness, tingling, focal weakness, seizures, weakness and headaches.  Endo/Heme/Allergies: Does not bruise/bleed easily.  Psychiatric/Behavioral: Negative for depression and suicidal ideas. The patient does not have insomnia.       Allergies  Allergen Reactions  . Shellfish Allergy     Throat closes, rash  . Latex Rash    Tapes,adhesives     Past Medical History:  Diagnosis Date  . Allergy    some  . Cataract    bilaterally both eyes   . GERD (gastroesophageal reflux disease)   . Hydrocephalus ([place])    [facility]   . Hyperglycemia   . Hyperlipidemia   . Hypertension   . Lymphocytosis    monoclonal b cell  . Migraine, unspecified, without mention of intractable migraine without mention of status migrainosus 12/14/2012  . Osteoarthritis, chronic   . Osteoporosis   . Peptic ulcer    some bleeding with ulcers as well      Past Surgical History:  Procedure Laterality Date  . ABDOMINAL HYSTERECTOMY    . BREAST CYST ASPIRATION Left 1993   Removed  .  breast cyst removed Right    benign   . BREAST LUMPECTOMY Bilateral   . COLONOSCOPY    . ERCP N/A 02/04/2018   Procedure: ENDOSCOPIC RETROGRADE  CHOLANGIOPANCREATOGRAPHY (ERCP);  Surgeon: Lucilla Lame, MD;  Location: [facility] ENDOSCOPY;  Service: Endoscopy;  Laterality: N/A;  . ERCP N/A 02/28/2018   Procedure: ENDOSCOPIC RETROGRADE CHOLANGIOPANCREATOGRAPHY (ERCP) STENT REMOVAL;  Surgeon: Lucilla Lame, MD;  Location: [facility] ENDOSCOPY;  Service: Endoscopy;  Laterality: N/A;  . EYE SURGERY    . growth removal Left 2019   L wrist growth removed, abnormal cells  . History of Cataract surgery Right 2011   left 2011  . PARTIAL HYSTERECTOMY    . POLYPECTOMY    . ROBOTIC ASSISTED LAPAROSCOPIC CHOLECYSTECTOMY-MULTI SITE N/A 03/08/2018   Procedure: ROBOTIC ASSISTED LAPAROSCOPIC CHOLECYSTECTOMY-MULTI SITE;  Surgeon: Jules Husbands, MD;  Location: [facility] ORS;  Service: General;  Laterality: N/A;  . S/P ACL repair Left    knee  . TONSILLECTOMY    . TUBAL LIGATION  1975   Bilateral    Social History   Socioeconomic History  . Marital status: Divorced    Spouse name: Not on file  . Number of children: 1  . Years of education: college  . Highest education level: Not on file  Occupational History  . Occupation: book Production designer, theatre/television/film: OTHER    Comment: Paediatric nurse  Social Needs  . Financial resource strain: Not on file  . Food insecurity:    Worry: Not on file    Inability: Not on file  . Transportation needs:    Medical: Not on file    Non-medical: Not on file  Tobacco Use  . Smoking status: Never Smoker  . Smokeless tobacco: Never Used  Substance and Sexual Activity  . Alcohol use: Yes    Alcohol/week: 1.0 standard drinks    Types: 1 Glasses of wine per week    Comment: Rare- wine on occasion  . Drug use: No  . Sexual activity: Not on file  Lifestyle  . Physical activity:    Days per week: Not on file    Minutes per session: Not on file  . Stress: Not on file  Relationships  . Social connections:    Talks on phone: Not on file    Gets together: Not on file    Attends religious service: Not on file    Active  member of club or organization: Not on file    Attends meetings of clubs or organizations: Not on file    Relationship status: Not on file  . Intimate partner violence:    Fear of current or ex partner: Not on file    Emotionally abused: Not on file    Physically abused: Not on file    Forced sexual activity: Not on file  Other Topics Concern  . Not on file  Social History Narrative   Patient is right handed, resides alone. Divorced.    Family History  Problem Relation Age of Onset  . Colon cancer Mother   . Hypertension Mother   . Colon cancer Father   . Heart disease Father   . Hypertension Father   . Colon polyps Neg Hx   . Rectal cancer Neg Hx   . Stomach cancer Neg Hx      Current Outpatient Medications:  .  ALPRAZolam (XANAX) 0.25 MG tablet, TAKE 2 TABLETS BY MOUTH DAILY (Patient taking differently: Take 0.5 mg by mouth at bedtime. ), Disp:  60 tablet, Rfl: 5 .  aspirin 81 MG tablet, Take 81 mg by mouth daily., Disp: , Rfl:  .  atenolol (TENORMIN) 50 MG tablet, TAKE 1 TABLET BY MOUTH ONCE DAILY, Disp: 90 tablet, Rfl: 1 .  Cholecalciferol (VITAMIN D3) 1.25 MG (50000 UT) CAPS, TAKE 1 CAPSULE BY MOUTH EVERY MONTH AS DIRECTED, Disp: 4 capsule, Rfl: 5 .  cyclobenzaprine (FLEXERIL) 10 MG tablet, TAKE 1 TABLET(10 MG) BY MOUTH AT BEDTIME (Patient taking differently: Take 10 mg by mouth at bedtime. ), Disp: 30 tablet, Rfl: 5 .  donepezil (ARICEPT) 10 MG tablet, Take 1 tablet (10 mg total) by mouth at bedtime., Disp: 90 tablet, Rfl: 6 .  ezetimibe (ZETIA) 10 MG tablet, TAKE 1 TABLET BY MOUTH AT BEDTIME, Disp: 30 tablet, Rfl: 5 .  gabapentin (NEURONTIN) 600 MG tablet, Take 1/2 tab (300mg ) PO q breakfast, 1/2 tab (300mg ) PO q lunch and 1 full tab (600mg ) q hs, Disp: 60 tablet, Rfl: 3 .  levothyroxine (SYNTHROID, LEVOTHROID) 100 MCG tablet, Take 1 tablet (100 mcg total) by mouth daily. (Patient taking differently: Take 100 mcg by mouth daily before breakfast. ), Disp: 90 tablet, Rfl: 3 .   losartan (COZAAR) 50 MG tablet, TAKE 1 TABLET BY MOUTH AT BEDTIME, Disp: 90 tablet, Rfl: 1 .  Magnesium 250 MG TABS, Take 1 tablet by mouth daily., Disp: , Rfl:  .  potassium chloride SA (K-DUR,KLOR-CON) 20 MEQ tablet, take 1 tablet by mouth twice a day (Patient taking differently: Take 20 mEq by mouth 2 (two) times daily. ), Disp: 180 tablet, Rfl: 1 .  solifenacin (VESICARE) 10 MG tablet, TAKE 1 TABLET BY MOUTH ONCE DAILY, Disp: 90 tablet, Rfl: 4 .  triamterene-hydrochlorothiazide (MAXZIDE) 75-50 MG tablet, TAKE 1/2 TABLET BY MOUTH DAILY, Disp: 90 tablet, Rfl: 1 .  verapamil (VERELAN PM) 120 MG 24 hr capsule, TAKE 1 CAPSULE BY MOUTH ONCE DAILY, Disp: 90 capsule, Rfl: 0 .  zolpidem (AMBIEN) 10 MG tablet, Take 1 tablet (10 mg total) by mouth at bedtime., Disp: 90 tablet, Rfl: 1  Physical exam:  Vitals:   06/14/18 1420  BP: (!) 184/79  Pulse: 66  Resp: 18  Temp: (!) 96.8 F (36 C)  TempSrc: Tympanic  Weight: 197 lb 1.6 oz (89.4 kg)   Physical Exam HENT:     Head: Normocephalic and atraumatic.  Eyes:     Pupils: Pupils are equal, round, and reactive to light.  Neck:     Musculoskeletal: Normal range of motion.  Cardiovascular:     Rate and Rhythm: Normal rate and regular rhythm.     Heart sounds: Normal heart sounds.  Pulmonary:     Effort: Pulmonary effort is normal.     Breath sounds: Normal breath sounds.  Abdominal:     General: Bowel sounds are normal. There is no distension.     Palpations: Abdomen is soft.     Tenderness: There is no abdominal tenderness.     Comments: No palpable splenomegaly  Skin:    General: Skin is warm and dry.  Neurological:     Mental Status: She is alert and oriented to person, place, and time.      CMP Latest Ref Rng & Units 03/07/2018  Glucose 65 - 99 mg/dL -  BUN 8 - 27 mg/dL -  Creatinine 0.57 - 1.00 mg/dL -  Sodium 134 - 144 mmol/L -  Potassium 3.5 - 5.1 mmol/L 3.8  Chloride 96 - 106 mmol/L -  CO2 20 -  29 mmol/L -  Calcium 8.7 -  10.3 mg/dL -  Total Protein 6.0 - 8.5 g/dL -  Total Bilirubin 0.0 - 1.2 mg/dL -  Alkaline Phos 39 - 117 IU/L -  AST 0 - 40 IU/L -  ALT 0 - 32 IU/L -   CBC Latest Ref Rng & Units 06/14/2018  WBC 4.0 - 10.5 K/uL 12.8(H)  Hemoglobin 12.0 - 15.0 g/dL 12.7  Hematocrit 36.0 - 46.0 % 39.1  Platelets 150 - 400 K/uL 265      Assessment and plan- Patient is a 78 y.o. female with following issues:  1.  Rai stage 0 CLL: Patient's white count has remained stable between 12-13 over the last 3 years.  Absolute lymphocyte count is between 6-7.  No evidence of B symptoms, palpable adenopathy or other cytopenias.  Her hemoglobin and platelet counts are normal.  Her CLL does not require any treatment at this time.  2.  Patient has peripheral blood eosinophilia and her eosinophil count is slowly trending up over the last 1 year from 0.9-1 0.3-1.4 today.  She does not have any evidence of asthma or chronic lung disease.  No clinical evidence of heart failure.  She does not have any skin rash or itching.  No history of any recent travel over the last 1 year  I will repeat CBC with differential in 3 months in 6 months and I will see her back in 6 months mainly to monitor her eosinophilia.  Her CLL as such can be monitored once a year.  If her eosinophilia consistently showed an upward trend I will consider additional investigations at that time   Visit Diagnosis 1. CLL (chronic lymphocytic leukemia) ([place])   2. Eosinophilia      Dr. Randa Evens, MD, MPH [facility] [phone] 06/20/2018 8:38 AM

## 2018-06-25 ENCOUNTER — Other Ambulatory Visit: Payer: Self-pay | Admitting: Physician Assistant

## 2018-06-25 DIAGNOSIS — I10 Essential (primary) hypertension: Secondary | ICD-10-CM

## 2018-06-29 ENCOUNTER — Other Ambulatory Visit: Payer: Self-pay | Admitting: Physician Assistant

## 2018-06-29 DIAGNOSIS — I10 Essential (primary) hypertension: Secondary | ICD-10-CM

## 2018-07-11 ENCOUNTER — Ambulatory Visit: Payer: Medicare Other | Admitting: Neurology

## 2018-07-13 ENCOUNTER — Other Ambulatory Visit: Payer: Self-pay | Admitting: Physician Assistant

## 2018-07-13 DIAGNOSIS — I10 Essential (primary) hypertension: Secondary | ICD-10-CM

## 2018-07-14 ENCOUNTER — Other Ambulatory Visit: Payer: Self-pay | Admitting: Neurology

## 2018-07-14 NOTE — Telephone Encounter (Signed)
Please review

## 2018-07-14 NOTE — Telephone Encounter (Signed)
Pharmacy is requesting refill.

## 2018-07-18 ENCOUNTER — Other Ambulatory Visit: Payer: Self-pay | Admitting: Physician Assistant

## 2018-07-18 ENCOUNTER — Other Ambulatory Visit: Payer: Self-pay | Admitting: Neurology

## 2018-07-18 DIAGNOSIS — E7800 Pure hypercholesterolemia, unspecified: Secondary | ICD-10-CM

## 2018-07-20 ENCOUNTER — Other Ambulatory Visit: Payer: Self-pay | Admitting: Neurology

## 2018-07-25 ENCOUNTER — Other Ambulatory Visit: Payer: Self-pay | Admitting: Neurology

## 2018-08-01 ENCOUNTER — Telehealth: Payer: Self-pay | Admitting: Neurology

## 2018-08-01 ENCOUNTER — Other Ambulatory Visit: Payer: Self-pay | Admitting: *Deleted

## 2018-08-01 DIAGNOSIS — G588 Other specified mononeuropathies: Secondary | ICD-10-CM

## 2018-08-01 NOTE — Telephone Encounter (Signed)
I spoke with the patient about the Gabapentin and gave her Dr. Cathren Laine message. She stated that the Gabapentin was restarted by her surgeon because of nerve damage from her gallbladder surgery. I advised pt that she should discuss her refills with her PCP as Dr. Jaynee Eagles does not treat her for the nerve damage. Pt verbalized understanding and appreciation. She has an upcoming f/u early May for [facility]. Pt aware that we are currently doing telemedicine and may be reaching out to her soon to discuss changing her appt.

## 2018-08-01 NOTE — Telephone Encounter (Signed)
Pt is needing a refill on her gabapentin (NEURONTIN) 600 MG tablet sent to the [facility] in [place]

## 2018-08-01 NOTE — Telephone Encounter (Signed)
I would tell her she needs to talk to pcp about it, that is one of the meds I recommended she not continue due to memory changes and asked her to discuss with pcp.

## 2018-08-02 MED ORDER — GABAPENTIN 600 MG PO TABS: ORAL_TABLET | ORAL | 5 refills | Status: DC

## 2018-08-14 ENCOUNTER — Other Ambulatory Visit: Payer: Self-pay | Admitting: Physician Assistant

## 2018-08-14 DIAGNOSIS — G4700 Insomnia, unspecified: Secondary | ICD-10-CM

## 2018-08-15 NOTE — Telephone Encounter (Signed)
L.O.V. 06/06/2018, please advise.

## 2018-08-22 ENCOUNTER — Encounter: Payer: Self-pay | Admitting: *Deleted

## 2018-08-22 ENCOUNTER — Telehealth: Payer: Self-pay | Admitting: *Deleted

## 2018-08-22 NOTE — Telephone Encounter (Signed)
Called pt to update her information before her telephone visit with Dr. Jaynee Eagles tomorrow 5/5. Phone rang multiple times with no vm answer.   Called pt's mobile # and spoke with her. Confirmed pt using 2 identifiers. Pt has provided consent to a telephone visit. Aware there are limitations with the physical exam. Pt provided consent to file visit with insurance. I updated pt's chart. Pt only taking Gabapentin 600 mg once at night, no longer taking during the day. She is aware that she will receive a call to check in at 10:00 tomorrow morning followed by Dr. Cathren Laine call around 10:30. Pt verbalized appreciation and understanding.

## 2018-08-23 ENCOUNTER — Other Ambulatory Visit: Payer: Self-pay

## 2018-08-23 ENCOUNTER — Ambulatory Visit (INDEPENDENT_AMBULATORY_CARE_PROVIDER_SITE_OTHER): Payer: Medicare Other | Admitting: Neurology

## 2018-08-23 DIAGNOSIS — G3184 Mild cognitive impairment, so stated: Secondary | ICD-10-CM

## 2018-08-23 DIAGNOSIS — R413 Other amnesia: Secondary | ICD-10-CM

## 2018-08-23 NOTE — Progress Notes (Signed)
[facility]    Provider:  Dr Jaynee Eagles Referring Provider: Florian Buff* Primary Care Physician:  Mar Daring, PA-C  CC: Memory loss  Interval history 08/23/2018: Virtual Visit via Telephone Note  I connected with Cathy Barnes on 08/23/2018 at 10:30 AM EDT by telephone and verified that I am speaking with the correct person using two identifiers.   Location: Patient: Home Provider: Office   I discussed the limitations, risks, security and privacy concerns of performing an evaluation and management service by telephone and the availability of in person appointments. I also discussed with the patient that there may be a patient responsible charge related to this service. The patient expressed understanding and agreed to proceed.  She has been seeing [place] for multiple years for memory and she was referred to formal memory testing last year but could not get it completed will reorder. We decreased her Topiramate. She also has migraines. She is on Aricept. She had a sleep test in the past and no signs or symptoms of OSA. Also recommended decreasing Gabapentin and other meds. She were continuing to work until recently due to [place]. She hopes to go back to work when reopen. Migraines are well controlled.    Interval history 06/30/2017: Patient's MMSE again 30/30 today. She feels her memory is still worsening, she has extensive family history of dementia on both sides. . She tripped over a cord and hit the door frame when she was working. Her nose was possibly broken, she had bruising all over the face. She did not go to the emergency room. No headaches. We titrated her down on the Topiramate because of memory complaints on multiple medications that could cause symptoms. No concussive symptoms.    Interval history 11/14/2015: Patient comes back today. She is feeling much better as far as memory loss go since decreasing the Topamax from 200-100. She sees no changes  in her migraines 100 mg. We'll further decrease Topamax to 50 mg. 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 Montral cognitive assessment score was 23 but Mini-Mental status exam was 30 out of 30. We have clinical trial here, [facility]READ, discussed at length with patient and he should be a wonderful candidate for this clinical trial which includes imaging and no cost patient including PET amyloid scans which would be very interesting especially in this patient's case. I discussed this at length, in the research team also came in and discuss with her provided brochures. We will continue to follow her every 6 months.  Interval history 05/06/2015: She is having eye twitching with the Aricept, she had diarrhea with 10mg  and she went back to 5mg . Reviewed labs and MRI images with patient and compared them to 2010 MRI which is mostly stable. Showed chronic microvascular ischemic changes, discussed vascular risk factors and treatment.   She has migraines, every other day last 4-6 hours, unilateral, either side, throbbing and pulsatin with light sensitivity, sounds sensitivity, nausea no vomiting. No aura. No medication obveruse. On Topamax. On Atenolol. Failed Depakote, Pamelor and Elavil, she has failed imitrex oral and takes shots which may or may not help. She can't work, they can be severe 10/10 on average 5-6/10. At least 16 or more migraines a month. For the last 5-10 years or longer.   HPI: Cathy Barnes is a 78 y.o. female here as a referral from Dr. Venia Minks for memory loss. PMHx of migraine, HTN, essential tremor. She is a patient of Dr. [name] and Ward Givens  and is seeing me today. Symptoms started within the last 2 months. She is having problems with numbers. She is having some short-term memory changes but most significantly with numbers. 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 She is scared, something is not right. Started acutely and getting worse. She took some prednisone for her foot and then this problem started. That may be a coincidence. She has stopped the steroids. No new medication changes, nothing new. No head trauma or inciting events. No other short-term memory changes except sometimes she forgets little things when she is very tired. Her TSH is monitored by pcp for hypothyroidism. No snoring at night, not excessively tired during the day, she had a sleep test in the past. No alteration of consciousness. No other focal neurologic symptoms.   Review of Systems: Patient complains of symptoms per HPI as well as the following symptoms: incontinence, dizzy, muscle cramps. Pertinent negatives per HPI. All others negative.  Social History   Socioeconomic History  . Marital status: Divorced    Spouse name: Not on file  . Number of children: 1  . Years of education: college  . Highest education level: Not on file  Occupational History  . Occupation: book Production designer, theatre/television/film: OTHER    Comment: Paediatric nurse  Social Needs  . Financial resource strain: Not on file  . Food insecurity:    Worry: Not on file    Inability: Not on file  . Transportation needs:    Medical: Not on file    Non-medical: Not on file  Tobacco Use  . Smoking status: Never Smoker  . Smokeless tobacco: Never Used  Substance and Sexual Activity  . Alcohol use: Yes    Alcohol/week: 1.0 standard drinks    Types: 1 Glasses of wine per week    Comment: Rare- wine on occasion  . Drug use: No  . Sexual activity: Not on file  Lifestyle  . Physical activity:    Days per week: Not on file    Minutes per session: Not on file  . Stress: Not on file  Relationships  . Social connections:    Talks on phone: Not on file    Gets together: Not on file    Attends religious service: Not on file    Active member of club or organization: Not on file    Attends meetings of  clubs or organizations: Not on file    Relationship status: Not on file  . Intimate partner violence:    Fear of current or ex partner: Not on file    Emotionally abused: Not on file    Physically abused: Not on file    Forced sexual activity: Not on file  Other Topics Concern  . Not on file  Social History Narrative   Patient is right handed, resides alone. Divorced.    Family History  Problem Relation Age of Onset  . Colon cancer Mother   . Hypertension Mother   . Colon cancer Father   . Heart disease Father   . Hypertension Father   . Colon polyps Neg Hx   . Rectal cancer Neg Hx   . Stomach cancer Neg Hx     Past Medical History:  Diagnosis Date  . Allergy    some  . Cataract  bilaterally both eyes   . Gallstones   . GERD (gastroesophageal reflux disease)   . Hydrocephalus ([place])    [facility]   . Hyperglycemia   . Hyperlipidemia   . Hypertension   . Lymphocytosis    monoclonal b cell  . Migraine, unspecified, without mention of intractable migraine without mention of status migrainosus 12/14/2012  . Osteoarthritis, chronic   . Osteoporosis   . Peptic ulcer    some bleeding with ulcers as well     Past Surgical History:  Procedure Laterality Date  . ABDOMINAL HYSTERECTOMY    . BREAST CYST ASPIRATION Left 1993   Removed  . breast cyst removed Right    benign   . BREAST LUMPECTOMY Bilateral   . COLONOSCOPY    . ERCP N/A 02/04/2018   Procedure: ENDOSCOPIC RETROGRADE CHOLANGIOPANCREATOGRAPHY (ERCP);  Surgeon: Lucilla Lame, MD;  [facility] ENDOSCOPY;  Service: Endoscopy;  Laterality: N/A;  . ERCP N/A 02/28/2018   Procedure: ENDOSCOPIC RETROGRADE CHOLANGIOPANCREATOGRAPHY (ERCP) STENT REMOVAL;  Surgeon: Lucilla Lame, MD;  Location: [facility] ENDOSCOPY;  Service: Endoscopy;  Laterality: N/A;  . EYE SURGERY    . growth removal Left 2019   L wrist growth removed, abnormal cells  . History of Cataract surgery Right 2011   left 2011  . PARTIAL HYSTERECTOMY     . POLYPECTOMY    . ROBOTIC ASSISTED LAPAROSCOPIC CHOLECYSTECTOMY-MULTI SITE N/A 03/08/2018   Procedure: ROBOTIC ASSISTED LAPAROSCOPIC CHOLECYSTECTOMY-MULTI SITE;  Surgeon: Jules Husbands, MD;  Location: [facility] ORS;  Service: General;  Laterality: N/A;  . S/P ACL repair Left    knee  . TONSILLECTOMY    . TUBAL LIGATION  1975   Bilateral    Current Outpatient Medications  Medication Sig Dispense Refill  . ALPRAZolam (XANAX) 0.25 MG tablet Take 2 tablets (0.5 mg total) by mouth at bedtime. 180 tablet 1  . aspirin 81 MG tablet Take 81 mg by mouth daily.    [name] atenolol (TENORMIN) 50 MG tablet TAKE 1 TABLET BY MOUTH ONCE DAILY 90 tablet 1  . Cholecalciferol (VITAMIN D3) 1.25 MG (50000 UT) CAPS TAKE 1 CAPSULE BY MOUTH EVERY MONTH AS DIRECTED 4 capsule 5  . cyclobenzaprine (FLEXERIL) 10 MG tablet TAKE 1 TABLET(10 MG) BY MOUTH AT BEDTIME 30 tablet 0  . donepezil (ARICEPT) 10 MG tablet TAKE 1 TABLET(10 MG) BY MOUTH AT BEDTIME 90 tablet 6  . ezetimibe (ZETIA) 10 MG tablet TAKE 1 TABLET BY MOUTH AT BEDTIME 30 tablet 5  . gabapentin (NEURONTIN) 600 MG tablet Take 1/2 tab (300mg ) PO q breakfast, 1/2 tab (300mg ) PO q lunch and 1 full tab (600mg ) q hs (Patient taking differently: Takes 1 full tab (600mg ) every night) 60 tablet 5  . levothyroxine (SYNTHROID, LEVOTHROID) 100 MCG tablet Take 1 tablet (100 mcg total) by mouth daily. (Patient taking differently: Take 100 mcg by mouth daily before breakfast. ) 90 tablet 3  . losartan (COZAAR) 50 MG tablet TAKE 1 TABLET BY MOUTH AT BEDTIME 90 tablet 1  . Magnesium 250 MG TABS Take 1 tablet by mouth daily.    . potassium chloride SA (K-DUR,KLOR-CON) 20 MEQ tablet TAKE 1 TABLET BY MOUTH TWICE A DAY 180 tablet 1  . solifenacin (VESICARE) 10 MG tablet TAKE 1 TABLET BY MOUTH ONCE DAILY 90 tablet 4  . triamterene-hydrochlorothiazide (MAXZIDE) 75-50 MG tablet TAKE 1/2 TABLET BY MOUTH DAILY 90 tablet 1  . verapamil (VERELAN PM) 120 MG 24 hr capsule TAKE 1 CAPSULE BY MOUTH  ONCE DAILY  90 capsule 0  . zolpidem (AMBIEN) 10 MG tablet Take 1 tablet (10 mg total) by mouth at bedtime. 90 tablet 1   No current facility-administered medications for this visit.     Allergies as of 08/23/2018 - Review Complete 08/22/2018  Allergen Reaction Noted  . Shellfish allergy  12/17/2014  . Latex Rash 07/20/2016    Vitals: There were no vitals taken for this visit. Last Weight:  Wt Readings from Last 1 Encounters:  08/22/18 194 lb (88 kg)   Last Height:   Ht Readings from Last 1 Encounters:  08/22/18 5' 5.5" (1.664 m)   MMSE - [place] Exam 07/05/2017 05/18/2016 11/14/2015  Orientation to time 5 5 5   Orientation to Place 5 5 5   Registration 3 3 3   Attention/ Calculation 5 5 5   Recall 3 3 3   Language- name 2 objects 2 2 2   Language- repeat 1 1 1   Language- follow 3 step command 3 3 3   Language- read & follow direction 1 1 1   Write a sentence 1 1 1   Copy design 1 1 1   Total score 30 30 30    Montreal Cognitive Assessment  02/07/2015  Visuospatial/ Executive (0/5) 3  Naming (0/3) 2  Attention: Read list of digits (0/2) 2  Attention: Read list of letters (0/1) 1  Attention: Serial 7 subtraction starting at 100 (0/3) 1  Language: Repeat phrase (0/2) 0  Language : Fluency (0/1) 1  Abstraction (0/2) 2  Delayed Recall (0/5) 5  Orientation (0/6) 6  Total 23  Adjusted Score (based on education) 23    PRIOR EXAMINATIONS:   Cranial Nerves:  The pupils are equal, round, and reactive to light. Visual fields are full.. Extraocular movements are intact. The face is symmetric. Hearing intact. Voice is normal.  Gait:  Not ataxic  Motor Observation:  No asymmetry, no atrophy, and no involuntary movements noted. Tone:  Normal muscle tone.   Posture:  Posture is normal. normal erect   Assessment/Plan: 78 y.o. Absolutely lovely female here as a referral from Dr. Venia Minks for memory loss. MMSE 30/30 but MoCA 23/30, possibly mild cognitive  impairment. Could also very well be medication related as she is on a lot of medications that can cause cognitive problems such as topamax, flexeril, neurontin, alprazolam.   we have been seeing this very lovely patient for many years who continues to feel impaired and has a FHx of dementia on both sides of the family.  At this point will send for formal cognitive testing.   - She has been seeing [place] for multiple years for memory and she was referred to formal memory testing last year but could not get it completed will reorder. 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 - We decreased her Topiramate, migraines stable. - She is on Aricept can continue.  - She had a sleep test in the past and no signs or symptoms of OSA. - - recommended decreasing Gabapentin and other meds that may cause cognitive dysfunction - MRi of the brain unremarkable, chronic microvascular ischemic white matter changes. Discussed vascular risk factors and trestment (BP control, diet and exercise, statins, watching glucose) - B12, tsh unremarkable   Orders Placed This Encounter  Procedures  . Ambulatory referral to Neuropsychology   Follow Up Instructions:    I discussed the assessment and treatment plan with the patient. The patient was provided an opportunity to ask questions and all were answered. The patient agreed with the plan and demonstrated an understanding of the instructions.  The patient was advised to call back or seek an in-person evaluation if the symptoms worsen or if the condition fails to improve as anticipated.  I provided 22 minutes of non-face-to-face time during this encounter.   [name], MD  Sarina Ill, MD  [facility] [address] [place], [place] [address]  Phone [phone] Fax [phone]

## 2018-08-29 ENCOUNTER — Other Ambulatory Visit: Payer: Self-pay | Admitting: Neurology

## 2018-09-10 ENCOUNTER — Other Ambulatory Visit: Payer: Self-pay | Admitting: Neurology

## 2018-09-13 ENCOUNTER — Other Ambulatory Visit: Payer: Self-pay

## 2018-09-13 ENCOUNTER — Inpatient Hospital Stay: Payer: Medicare Other | Attending: Oncology

## 2018-09-13 DIAGNOSIS — C9110 Chronic lymphocytic leukemia of B-cell type not having achieved remission: Secondary | ICD-10-CM | POA: Diagnosis present

## 2018-09-13 LAB — CBC WITH DIFFERENTIAL/PLATELET
Abs Immature Granulocytes: 0.03 10*3/uL (ref 0.00–0.07)
Basophils Absolute: 0.1 10*3/uL (ref 0.0–0.1)
Basophils Relative: 1 %
Eosinophils Absolute: 0.9 10*3/uL — ABNORMAL HIGH (ref 0.0–0.5)
Eosinophils Relative: 8 %
HCT: 41.5 % (ref 36.0–46.0)
Hemoglobin: 13.3 g/dL (ref 12.0–15.0)
Immature Granulocytes: 0 %
Lymphocytes Relative: 49 %
Lymphs Abs: 5.9 10*3/uL — ABNORMAL HIGH (ref 0.7–4.0)
MCH: 29.2 pg (ref 26.0–34.0)
MCHC: 32 g/dL (ref 30.0–36.0)
MCV: 91.2 fL (ref 80.0–100.0)
Monocytes Absolute: 1.2 10*3/uL — ABNORMAL HIGH (ref 0.1–1.0)
Monocytes Relative: 10 %
Neutro Abs: 3.8 10*3/uL (ref 1.7–7.7)
Neutrophils Relative %: 32 %
Platelets: 287 10*3/uL (ref 150–400)
RBC: 4.55 MIL/uL (ref 3.87–5.11)
RDW: 13.2 % (ref 11.5–15.5)
WBC: 11.9 10*3/uL — ABNORMAL HIGH (ref 4.0–10.5)
nRBC: 0 % (ref 0.0–0.2)

## 2018-10-04 ENCOUNTER — Other Ambulatory Visit: Payer: Self-pay | Admitting: Physician Assistant

## 2018-10-04 NOTE — Telephone Encounter (Signed)
Lat TSH was elevated. It looks like we haven't her labs need to be repeated. Call to offer an appointment? Or ok to fill.

## 2018-10-05 ENCOUNTER — Other Ambulatory Visit: Payer: Self-pay | Admitting: Neurology

## 2018-11-03 ENCOUNTER — Other Ambulatory Visit: Payer: Self-pay | Admitting: Neurology

## 2018-11-09 ENCOUNTER — Other Ambulatory Visit: Payer: Self-pay | Admitting: Physician Assistant

## 2018-11-09 DIAGNOSIS — I10 Essential (primary) hypertension: Secondary | ICD-10-CM

## 2018-11-24 ENCOUNTER — Other Ambulatory Visit: Payer: Self-pay | Admitting: Physician Assistant

## 2018-11-24 DIAGNOSIS — F5101 Primary insomnia: Secondary | ICD-10-CM

## 2018-11-25 ENCOUNTER — Telehealth: Payer: Self-pay

## 2018-11-25 NOTE — Telephone Encounter (Signed)
LMTCB-patient's Ambien was approved. I called [facility] pharmacy to let them know and patient paid out of pocket yesterday. PA was approved today.

## 2018-12-05 ENCOUNTER — Other Ambulatory Visit: Payer: Self-pay | Admitting: Neurology

## 2018-12-10 ENCOUNTER — Other Ambulatory Visit: Payer: Self-pay | Admitting: Neurology

## 2018-12-15 ENCOUNTER — Other Ambulatory Visit: Payer: Self-pay

## 2018-12-16 ENCOUNTER — Inpatient Hospital Stay: Payer: Medicare Other

## 2018-12-16 ENCOUNTER — Other Ambulatory Visit: Payer: Self-pay

## 2018-12-16 ENCOUNTER — Encounter: Payer: Self-pay | Admitting: Oncology

## 2018-12-16 ENCOUNTER — Inpatient Hospital Stay: Payer: Medicare Other | Attending: Oncology | Admitting: Oncology

## 2018-12-16 VITALS — BP 120/72 | HR 59 | Temp 97.1°F | Resp 16 | Ht 65.5 in | Wt 196.0 lb

## 2018-12-16 DIAGNOSIS — D721 Eosinophilia, unspecified: Secondary | ICD-10-CM

## 2018-12-16 DIAGNOSIS — Z79899 Other long term (current) drug therapy: Secondary | ICD-10-CM | POA: Insufficient documentation

## 2018-12-16 DIAGNOSIS — C9110 Chronic lymphocytic leukemia of B-cell type not having achieved remission: Secondary | ICD-10-CM | POA: Insufficient documentation

## 2018-12-16 DIAGNOSIS — E785 Hyperlipidemia, unspecified: Secondary | ICD-10-CM | POA: Insufficient documentation

## 2018-12-16 DIAGNOSIS — I10 Essential (primary) hypertension: Secondary | ICD-10-CM | POA: Insufficient documentation

## 2018-12-16 DIAGNOSIS — E039 Hypothyroidism, unspecified: Secondary | ICD-10-CM | POA: Diagnosis not present

## 2018-12-16 DIAGNOSIS — Z7982 Long term (current) use of aspirin: Secondary | ICD-10-CM | POA: Diagnosis not present

## 2018-12-16 LAB — CBC WITH DIFFERENTIAL/PLATELET
Abs Immature Granulocytes: 0.07 10*3/uL (ref 0.00–0.07)
Basophils Absolute: 0.1 10*3/uL (ref 0.0–0.1)
Basophils Relative: 1 %
Eosinophils Absolute: 0.8 10*3/uL — ABNORMAL HIGH (ref 0.0–0.5)
Eosinophils Relative: 6 %
HCT: 41.6 % (ref 36.0–46.0)
Hemoglobin: 13.5 g/dL (ref 12.0–15.0)
Immature Granulocytes: 1 %
Lymphocytes Relative: 57 %
Lymphs Abs: 8.3 10*3/uL — ABNORMAL HIGH (ref 0.7–4.0)
MCH: 29.4 pg (ref 26.0–34.0)
MCHC: 32.5 g/dL (ref 30.0–36.0)
MCV: 90.6 fL (ref 80.0–100.0)
Monocytes Absolute: 0.9 10*3/uL (ref 0.1–1.0)
Monocytes Relative: 6 %
Neutro Abs: 4.2 10*3/uL (ref 1.7–7.7)
Neutrophils Relative %: 29 %
Platelets: 242 10*3/uL (ref 150–400)
RBC: 4.59 MIL/uL (ref 3.87–5.11)
RDW: 14.2 % (ref 11.5–15.5)
WBC: 14.3 10*3/uL — ABNORMAL HIGH (ref 4.0–10.5)
nRBC: 0 % (ref 0.0–0.2)

## 2018-12-16 NOTE — Progress Notes (Signed)
Pt feels fine, no itching or rash, no pain

## 2018-12-17 NOTE — Progress Notes (Signed)
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 She has been sent to [place] for evaluation of leukocytosis. Over the last 1 year patient's white count has been around 12 with predominantly lymphocytosis and monocytosis as well as eosinophilia. No evidence of anemia or thrombocytopenia. Overall patient is doing well and denies any complaints of fevers, chills, unintentional weight loss, fatigue or night sweats. Denies any lumps or bumps anywhere  Further blood work from 07-2016 was as follows: CBC showed white count of 12.9 with an absolute lymphocyte count of 6.6 and an eosinophilia of 1.1. H&H was 13.4/41.6 and a platelet count of 270. CMP was unremarkable. Review of peripheral smear showed mild leukocytosis with absolute lymphocytosis and eosinophilia. BCR able testing was negative for CML. Flow cytometry showed CD5 positive, CD23 positive clonal B-cell population CLL/SLL phenotype CD38 negative. 36% of leukocytes are less than 5000/mcL. eosinophilia  Interval history- feels well appetite is good. She denies any unintentional weight loss. No drenching night sweats  ECOG PS- 1 Pain scale- 0   Review of systems- Review of Systems  Constitutional: Negative for chills, fever, malaise/fatigue and weight loss.  HENT:  Negative for congestion, ear discharge and nosebleeds.   Eyes: Negative for blurred vision.  Respiratory: Negative for cough, hemoptysis, sputum production, shortness of breath and wheezing.   Cardiovascular: Negative for chest pain, palpitations, orthopnea and claudication.  Gastrointestinal: Negative for abdominal pain, blood in stool, constipation, diarrhea, heartburn, melena, nausea and vomiting.  Genitourinary: Negative for dysuria, flank pain, frequency, hematuria and urgency.  Musculoskeletal: Negative for back pain, joint pain and myalgias.  Skin: Negative for rash.  Neurological: Negative for dizziness, tingling, focal weakness, seizures, weakness and headaches.  Endo/Heme/Allergies: Does not bruise/bleed easily.  Psychiatric/Behavioral: Negative for depression and suicidal ideas. The patient does not have insomnia.       Allergies  Allergen Reactions  . Shellfish Allergy     Throat closes, rash  . Latex Rash    Tapes,adhesives     Past Medical History:  Diagnosis Date  . Allergy    some  . Cataract    bilaterally both eyes   . 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 Gallstones   . GERD (gastroesophageal reflux disease)   . Hydrocephalus ([place])    [facility]   . Hyperglycemia   . Hyperlipidemia   . Hypertension   . Lymphocytosis    monoclonal b cell  . Migraine, unspecified, without mention of intractable migraine without mention of status migrainosus 12/14/2012  . Osteoarthritis, chronic   . Osteoporosis   . Peptic ulcer    some bleeding with ulcers as well      Past Surgical History:  Procedure Laterality Date  . ABDOMINAL HYSTERECTOMY    . BREAST CYST ASPIRATION Left 1993   Removed  . breast cyst removed Right    benign   . BREAST  LUMPECTOMY Bilateral   . COLONOSCOPY    . ERCP N/A 02/04/2018   Procedure: ENDOSCOPIC RETROGRADE CHOLANGIOPANCREATOGRAPHY (ERCP);  Surgeon: Lucilla Lame, MD;  Location: [facility] ENDOSCOPY;  Service:  Endoscopy;  Laterality: N/A;  . ERCP N/A 02/28/2018   Procedure: ENDOSCOPIC RETROGRADE CHOLANGIOPANCREATOGRAPHY (ERCP) STENT REMOVAL;  Surgeon: Lucilla Lame, MD;  Location: [facility] ENDOSCOPY;  Service: Endoscopy;  Laterality: N/A;  . EYE SURGERY    . growth removal Left 2019   L wrist growth removed, abnormal cells  . History of Cataract surgery Right 2011   left 2011  . PARTIAL HYSTERECTOMY    . POLYPECTOMY    . ROBOTIC ASSISTED LAPAROSCOPIC CHOLECYSTECTOMY-MULTI SITE N/A 03/08/2018   Procedure: ROBOTIC ASSISTED LAPAROSCOPIC CHOLECYSTECTOMY-MULTI SITE;  Surgeon: Jules Husbands, MD;  Location: [facility] ORS;  Service: General;  Laterality: N/A;  . S/P ACL repair Left    knee  . TONSILLECTOMY    . TUBAL LIGATION  1975   Bilateral    Social History   Socioeconomic History  . Marital status: Divorced    Spouse name: Not on file  . Number of children: 1  . Years of education: college  . Highest education level: Not on file  Occupational History  . Occupation: book Production designer, theatre/television/film: OTHER    Comment: Paediatric nurse  Social Needs  . Financial resource strain: Not on file  . Food insecurity    Worry: Not on file    Inability: Not on file  . Transportation needs    Medical: Not on file    Non-medical: Not on file  Tobacco Use  . Smoking status: Never Smoker  . Smokeless tobacco: Never Used  Substance and Sexual Activity  . Alcohol use: Yes    Alcohol/week: 1.0 standard drinks    Types: 1 Glasses of wine per week    Comment: Rare- wine on occasion  . Drug use: No  . Sexual activity: Not Currently  Lifestyle  . Physical activity    Days per week: Not on file    Minutes per session: Not on file  . Stress: Not on file  Relationships  . Social Herbalist on phone: Not on file    Gets together: Not on file    Attends religious service: Not on file    Active member of club or organization: Not on file    Attends meetings of clubs or organizations: Not on file     Relationship status: Not on file  . Intimate partner violence    Fear of current or ex partner: Not on file    Emotionally abused: Not on file    Physically abused: Not on file    Forced sexual activity: Not on file  Other Topics Concern  . Not on file  Social History Narrative   Patient is right handed, resides alone. Divorced.    Family History  Problem Relation Age of Onset  . Colon cancer Mother   . Hypertension Mother   . Colon cancer Father   . Heart disease Father   . Hypertension Father   . Colon polyps Neg Hx   . Rectal cancer Neg Hx   . Stomach cancer Neg Hx      Current Outpatient Medications:  .  ALPRAZolam (XANAX) 0.25 MG tablet, Take 2 tablets (0.5 mg total) by mouth at bedtime., Disp: 180 tablet, Rfl: 1 .  aspirin 81 MG tablet, Take 81 mg by mouth daily., Disp: , Rfl:  .  atenolol (TENORMIN) 50 MG tablet, TAKE 1 TABLET BY MOUTH ONCE DAILY, Disp: 90 tablet, Rfl: 1 .  Cholecalciferol (VITAMIN D3) 1.25 MG (50000 UT) CAPS, TAKE 1 CAPSULE BY MOUTH EVERY MONTH AS DIRECTED, Disp: 4 capsule, Rfl: 5 .  cyclobenzaprine (FLEXERIL) 10 MG tablet, TAKE 1 TABLET(10 MG) BY MOUTH AT BEDTIME, Disp: 30 tablet, Rfl: 0 .  donepezil (ARICEPT) 10 MG tablet, TAKE 1 TABLET(10 MG) BY MOUTH AT BEDTIME, Disp: 90 tablet, Rfl: 6 .  ezetimibe (ZETIA) 10 MG tablet, TAKE 1 TABLET BY MOUTH AT BEDTIME, Disp: 30 tablet, Rfl: 5 .  gabapentin (NEURONTIN) 600 MG tablet, Take 1/2 tab (300mg ) PO q breakfast, 1/2 tab (300mg ) PO q lunch and 1 full tab (600mg ) q hs (Patient taking differently: Takes 1 full tab (600mg ) every night), Disp: 60 tablet, Rfl: 5 .  levothyroxine (SYNTHROID) 100 MCG tablet, TAKE 1 TABLET(100 MCG) BY MOUTH DAILY, Disp: 90 tablet, Rfl: 3 .  losartan (COZAAR) 50 MG tablet, TAKE 1 TABLET BY MOUTH AT BEDTIME, Disp: 90 tablet, Rfl: 1 .  Magnesium 250 MG TABS, Take 1 tablet by mouth daily., Disp: , Rfl:  .  potassium chloride SA (K-DUR,KLOR-CON) 20 MEQ tablet, TAKE 1 TABLET BY MOUTH  TWICE A DAY, Disp: 180 tablet, Rfl: 1 .  solifenacin (VESICARE) 10 MG tablet, TAKE 1 TABLET BY MOUTH ONCE DAILY, Disp: 90 tablet, Rfl: 4 .  triamterene-hydrochlorothiazide (MAXZIDE) 75-50 MG tablet, TAKE 1/2 TABLET BY MOUTH DAILY, Disp: 90 tablet, Rfl: 1 .  verapamil (VERELAN PM) 120 MG 24 hr capsule, TAKE 1 CAPSULE BY MOUTH EVERY DAY, Disp: 90 capsule, Rfl: 0 .  zolpidem (AMBIEN) 10 MG tablet, TAKE 1 TABLET BY MOUTH EVERY DAY AT BEDTIME, Disp: 90 tablet, Rfl: 1  Physical exam:  Vitals:   12/16/18 1341  BP: 120/72  Pulse: (!) 59  Resp: 16  Temp: (!) 97.1 F (36.2 C)  TempSrc: Tympanic  Weight: 196 lb (88.9 kg)  Height: 5' 5.5" (1.664 m)   Physical Exam HENT:     Head: Normocephalic and atraumatic.  Eyes:     Pupils: Pupils are equal, round, and reactive to light.  Neck:     Musculoskeletal: Normal range of motion.  Cardiovascular:     Rate and Rhythm: Normal rate and regular rhythm.     Heart sounds: Normal heart sounds.  Pulmonary:     Effort: Pulmonary effort is normal.     Breath sounds: Normal breath sounds.  Abdominal:     General: Bowel sounds are normal.     Palpations: Abdomen is soft.  Lymphadenopathy:     Comments: No palpable cervical, supraclavicular, axillary or inguinal adenopathy   Skin:    General: Skin is warm and dry.  Neurological:     Mental Status: She is alert and oriented to person, place, and time.      CMP Latest Ref Rng & Units 03/07/2018  Glucose 65 - 99 mg/dL -  BUN 8 - 27 mg/dL -  Creatinine 0.57 - 1.00 mg/dL -  Sodium 134 - 144 mmol/L -  Potassium 3.5 - 5.1 mmol/L 3.8  Chloride 96 - 106 mmol/L -  CO2 20 - 29 mmol/L -  Calcium 8.7 - 10.3 mg/dL -  Total Protein 6.0 - 8.5 g/dL -  Total Bilirubin 0.0 - 1.2 mg/dL -  Alkaline Phos 39 - 117 IU/L -  AST 0 - 40 IU/L -  ALT 0 - 32 IU/L -   CBC Latest Ref Rng & Units  12/16/2018  WBC 4.0 - 10.5 K/uL 14.3(H)  Hemoglobin 12.0 - 15.0 g/dL 13.5  Hematocrit 36.0 - 46.0 % 41.6  Platelets 150 -  400 K/uL 242    Assessment and plan- Patient is a 78 y.o. female with Rai Stage 0 CLL  Clinically doing well. No palpable adenopathy. No B symptoms. No cytopenias. lymohocyte doubling time is > 6 months. CLL does not require treatment at this time.   Eosinophilia- mild and waxing and waning. Probably related to cll. Continue to monitor  I will see her back in 6 months with cbc with diff, cmp   Visit Diagnosis 1. CLL (chronic lymphocytic leukemia) ([place])   2. Eosinophilia      Dr. Randa Evens, MD, MPH [facility] [id number] 12/17/2018 1:45 PM

## 2018-12-21 ENCOUNTER — Other Ambulatory Visit: Payer: Self-pay | Admitting: Physician Assistant

## 2018-12-21 DIAGNOSIS — I10 Essential (primary) hypertension: Secondary | ICD-10-CM

## 2018-12-22 ENCOUNTER — Encounter: Payer: Medicare Other | Attending: Psychology | Admitting: Psychology

## 2018-12-22 ENCOUNTER — Encounter: Payer: Self-pay | Admitting: Psychology

## 2018-12-22 ENCOUNTER — Other Ambulatory Visit: Payer: Self-pay

## 2018-12-22 DIAGNOSIS — R413 Other amnesia: Secondary | ICD-10-CM | POA: Insufficient documentation

## 2018-12-22 DIAGNOSIS — G3184 Mild cognitive impairment, so stated: Secondary | ICD-10-CM

## 2018-12-22 NOTE — Progress Notes (Signed)
Neuropsychological Consultation   Patient:   Cathy Barnes   DOB:   [birth year]  MR Number:  [id number]  Location:  [facility] [place], [place] [id number] [place] [id number] Dept: [phone]           Date of Service:   12/22/2018  Start Time:   8 AM End Time:   9 AM  Provider/Observer:  Ilean Skill, Psy.D.       Clinical Neuropsychologist       Billing Code/Service: [phone], [phone]  Chief Complaint:    Cathy Barnes is a 78 year old female referred by Dr. Jaynee Eagles for neuropsychological evaluation due to concerns about memory difficulties in the context of a family history of dementia on both sides of her family.  The patient has a prior history of migraine, hypertension, and essential tremor.  The patient began seeing Dr. Erling Cruz for neurological treatment several years ago because of severe and recurrent migraine history.  The patient reports that her migraines have significantly improved over the years.  The patient began describing memory difficulties to her PCP approximately 4 years ago but even with this multiyear history of concerns over her memory functions they have not progressed on cognitive screening measures beyond mild impairments.  Reason for Service:  Cathy Barnes is a 78 year old female referred by Dr. Jaynee Eagles for neuropsychological evaluation due to concerns about memory difficulties in the context of a family history of dementia on both sides of her family.  The patient has a prior history of migraine, hypertension, and essential tremor.  The patient began seeing Dr. Erling Cruz for neurological treatment several years ago because of severe and recurrent migraine history.  The patient reports that her migraines have significantly improved over the years.  The patient began describing memory difficulties to her PCP approximately 4 years ago but even with this multiyear  history of concerns over her memory functions they have not progressed on cognitive screening measures beyond mild impairments.  The patient reports that she wants to establish a baseline of cognitive functioning performance with concerns of potential progression in her memory difficulties.  The patient reports that sometimes she does not think as fast as she would like to and her cognitive efficiency clearly decreases as she is increasingly tired.  The patient did have some issues with hydrocephalus years ago that that was evaluated with a spinal tap but was never needed to be addressed surgically.  The patient's mother did end up having a shunt for the same reason.  The patient had an MRI conducted in both 2010 as well as 2016.  The impression provided of the 2016 MRI was 1 of an abnormal MRI of the brain with no acute intracranial abnormality.  There was scattered T2/Flair hyperintensity chronic foci, predominantly in the subcortical and deep white matter of both hemispheres.  Dr. Felecia Shelling read this is a nonspecific finding and could be idiopathic or due to chronic microvascular ischemic changes, migraine or cardio emboli.  The pattern was not felt to be typical of demyelinization types of syndromes.  This MRI was compared to the MRI dated 2010 and showed mild progression in the overall number of foci.  The patient reports that she has blood work done every 6 months to monitor her red blood cell count, which has times where her red blood cell count is low at times.  She just had blood work last week.  The  patient reports that her sleep pattern is disturbed at times and that she takes Ambien to go to sleep and when she takes her Ambien she falls right off to sleep and does describe a report that she feels rested in the morning.  Current Status:  The patient describes slowed cognitive functioning and efficiency, trouble coming up with names of objects or people and reports that she will have the name come to  her later or with cueing.  The patient denies any geographic disorientation or other visual spatial deficits.  Reliability of Information: The information is derived from 1 hour face-to-face clinical interview as well as review of available medical records.  The patient appeared to be a good historian and this does appear to be reliable information.  Behavioral Observation: Cathy Barnes  presents as a 78 y.o.-year-old Right Caucasian Female who appeared her stated age. her dress was Appropriate and she was Well Groomed and her manners were Appropriate to the situation.  her participation was indicative of Appropriate and Attentive behaviors.  There were not any physical disabilities noted.  she displayed an appropriate level of cooperation and motivation.     Interactions:    Active Appropriate and Attentive  Attention:   within normal limits and attention span and concentration were age appropriate  Memory:   abnormal; remote memory intact, recent memory impaired  Visuo-spatial:  not examined  Speech (Volume):  normal  Speech:   normal; the patient had some mild word finding issues noted during the clinical interview  Thought Process:  Coherent and Relevant  Though Content:  WNL; not suicidal and not homicidal  Orientation:   person, place, time/date and situation  Judgment:   Good  Planning:   Good  Affect:    Appropriate  Mood:    Dysphoric  Insight:   Good  Intelligence:   normal  Marital Status/Living: The patient was born and raised in [place] and had no siblings.  The patient is divorced and was married for 36 years started in 1964.  The patient has 35 year old daughter.  She currently lives alone.  She is lived alone for the past 20 years.  Current Employment: The patient is retired.  Past Employment:  The patient worked as an Web designer for 30 years.  She also worked part-time at a department store until social restrictions from  [employer] started in the store closed.  Hobbies and interests include needlepoint,'s cross-stitch, bowling and scrapbooking.  Substance Use:  No concerns of substance abuse are reported.  The patient reports that she only has wine occasionally with dinner no other substance use.  Education:   The patient graduated from high school and had 1 year of business college through [employer].  Medical History:   Past Medical History:  Diagnosis Date  . Allergy    some  . Cataract    bilaterally both eyes   . 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 Gallstones   . GERD (gastroesophageal reflux disease)   . Hydrocephalus ([place])    [facility]   . Hyperglycemia   . Hyperlipidemia   . Hypertension   . Lymphocytosis    monoclonal b cell  . Migraine, unspecified, without mention of intractable migraine without mention of status migrainosus 12/14/2012  . Osteoarthritis, chronic   . Osteoporosis   . Peptic ulcer    some bleeding with ulcers as well  Abuse/Trauma History: The patient does not report or describe any significant abuse or trauma history.  Psychiatric History:  The patient has no psychiatric history.  Family Med/Psych History:  Family History  Problem Relation Age of Onset  . Colon cancer Mother   . Hypertension Mother   . Colon cancer Father   . Heart disease Father   . Hypertension Father   . Colon polyps Neg Hx   . Rectal cancer Neg Hx   . Stomach cancer Neg Hx      Impression/DX:  Airyonna Bevard is a 78 year old female referred by Dr. Jaynee Eagles for neuropsychological evaluation due to concerns about memory difficulties in the context of a family history of dementia on both sides of her family.  The patient has a prior history of migraine, hypertension, and essential tremor.  The patient began seeing Dr. Erling Cruz for neurological treatment several years ago because of severe and recurrent migraine history.  The patient reports that her migraines  have significantly improved over the years.  The patient began describing memory difficulties to her PCP approximately 4 years ago but even with this multiyear history of concerns over her memory functions they have not progressed on cognitive screening measures beyond mild impairments.  The patient describes slowed cognitive functioning and efficiency, trouble coming up with names of objects or people and reports that she will have the name come to her later or with cueing.  The patient denies any geographic disorientation or other visual spatial deficits.  Disposition/Plan:  We have set the patient up for formal neuropsychological testing.  The primary measures that will be used include the Wechsler Adult Intelligence Scale-IV as well as the Wechsler Memory Scale-IV for older adults.  We will also look at other testing including executive functioning testing and measures of information processing speed.  Diagnosis:    Mild cognitive impairment         Electronically Signed   _______________________ Ilean Skill, Psy.D.

## 2018-12-30 ENCOUNTER — Other Ambulatory Visit: Payer: Self-pay

## 2018-12-30 ENCOUNTER — Encounter: Payer: Medicare Other | Admitting: Psychology

## 2018-12-30 ENCOUNTER — Encounter: Payer: Self-pay | Admitting: Psychology

## 2018-12-30 DIAGNOSIS — R413 Other amnesia: Secondary | ICD-10-CM | POA: Diagnosis not present

## 2018-12-30 DIAGNOSIS — G3184 Mild cognitive impairment, so stated: Secondary | ICD-10-CM

## 2018-12-30 NOTE — Progress Notes (Signed)
The patient arrived on time to her 8:00 testing appointment, which lasted 240 minutes.  Behavioral Observations:  Appearance: Casually and appropriately dressed with good hygiene. Gait: Ambulated independently without assistance.  Speech: Normal rate, tone & volume Thought process:  Logical, somewhat impulsive and disorganized.  Mood/Affect: Mild-moderately anxious, appropriate range.  Interpersonal: Polite and appropriate.  Orientation: O x 4 Effort/Motivation: Good   She had no difficulty hearing, seeing, or understanding test items. She appeared to get frustrated on questions she did not know or tasks that were more difficult but exhibited appropriate distress tolerance overall. She was cooperative with all assigned tasks and persisted well throughout the evaluation.   Tests Administered: . Animal Naming  . [place] (BNT) . Trail Making Test (Part A & B) . Wechsler Adult Intelligence Scale, 4th Edition (WAIS-IV) . Wechsler Memory Scale, 4th edition, Older Adult Battery (WMS-IV-OA)  Results:  Animal Naming . Total=22, T=58, 79th %  [place] . Total (W/o Cue + Semantic Cue) =54/60, T=52, 58th %  Trail Making Test  . Part A (Time=37", T=50, 50th %) . Part B (Time=129", T=40, 16th %)  WAIS-IV Composite Score Summary  Scale Sum of Scaled Scores Composite Score Percentile Rank 95% Conf. Interval Qualitative Description  Verbal Comprehension 30 VCI 100 50 94-106 Average  Perceptual Reasoning 32 PRI 104 61 98-110 Average  Working Memory 17 WMI 92 30 86-99 Average  Processing Speed 17 PSI 92 30 84-101 Average  Full Scale 96 FSIQ 97 42 93-101 Average  General Ability 62 GAI 101 53 96-106 Average   Index Level Discrepancy Comparisons  Comparison Score 1 Score 2 Difference Critical Value .05 Significant Difference Y/N Base Rate by Overall Sample  VCI - PRI 100 104 -4 9.29 N 39.7  VCI - WMI 100 92 8 8.81 N 27.1  VCI - PSI 100 92 8 10.18 N 31.5  PRI -  WMI 104 92 12 9.74 Y 18.4  PRI - PSI 104 92 12 10.99 Y 21.8  WMI - PSI 92 92 0 10.59 N   FSIQ - GAI 97 101 -4 3.34 Y 23.8   WMS-IV-OA Brief Cognitive Status Exam Classification  Age Years of Education Raw Score Classification Level Base Rate  77 years 9 months 13 49 Average 100.0    Index Score Summary  Index Sum of Scaled Scores Index Score Percentile Rank 95% Confidence Interval Qualitative Descriptor  Auditory Memory (AMI) 52 118 88 111-123 High Average  Visual Memory (VMI) 24 111 77 106-116 High Average  Immediate Memory (IMI) 36 112 79 105-118 High Average  Delayed Memory (DMI) 40 122 93 113-128 Superior   Primary Subtest Scaled Score Summary  Subtest Domain Raw Score Scaled Score Percentile Rank  Logical Memory I AM 39 14 91  Logical Memory II AM 26 14 91  Verbal Paired Associates I AM 24 12 75  Verbal [facility] II AM 7 12 75  Visual Reproduction I VM 27 10 50  Visual Reproduction II VM 28 14 91  Symbol Span VWM 18 12 75   PROCESS SCORE CONVERSIONS  Auditory Memory Process Score Summary  Process Score Raw Score Scaled Score Percentile Rank Cumulative Percentage (Base Rate)  LM II Recognition 21 - - >75%  VPA II Recognition 28 - - 51-75%   Visual Memory Process Score Summary  Process Score Raw Score Scaled Score Percentile Rank Cumulative Percentage (Base Rate)  VR II Recognition 5 - - 51-75%    ABILITY-MEMORY ANALYSIS  Ability  Score:  GAI: 101 Date of Testing:  WAIS-IV; WMS-IV 2018/12/30  Predicted Difference Method   Index Predicted WMS-IV Index Score Actual WMS-IV Index Score Difference Critical Value  Significant Difference Y/N Base Rate  Auditory Memory 101 118 -17 9.16 Y 10%  Visual Memory 101 111 -10 8.46 Y 20-25%  Immediate Memory 101 112 -11 10.38 Y 15-20%  Delayed Memory 101 122 -21 12.01 Y 5%  Statistical significance (critical value) at the .01 level.   Contrast Scaled Scores  Score Score 1 Score 2 Contrast Scaled Score   General Ability Index vs. Auditory Memory Index 101 118 14  General Ability Index vs. Visual Memory Index 101 111 12  General Ability Index vs. Immediate Memory Index 101 112 13  General Ability Index vs. Delayed Memory Index 101 122 16  Verbal Comprehension Index vs. Auditory Memory Index 100 118 14  Perceptual Reasoning Index vs. Visual Memory Index 104 111 12  Working Memory Index vs. Auditory Memory Index 92 118 15

## 2019-01-02 ENCOUNTER — Other Ambulatory Visit: Payer: Self-pay | Admitting: Physician Assistant

## 2019-01-02 DIAGNOSIS — I10 Essential (primary) hypertension: Secondary | ICD-10-CM

## 2019-01-06 ENCOUNTER — Encounter (HOSPITAL_BASED_OUTPATIENT_CLINIC_OR_DEPARTMENT_OTHER): Payer: Medicare Other | Admitting: Psychology

## 2019-01-06 ENCOUNTER — Encounter: Payer: Self-pay | Admitting: Psychology

## 2019-01-06 ENCOUNTER — Other Ambulatory Visit: Payer: Self-pay

## 2019-01-06 DIAGNOSIS — Z87898 Personal history of other specified conditions: Secondary | ICD-10-CM

## 2019-01-06 DIAGNOSIS — R413 Other amnesia: Secondary | ICD-10-CM | POA: Diagnosis not present

## 2019-01-06 DIAGNOSIS — G3184 Mild cognitive impairment, so stated: Secondary | ICD-10-CM | POA: Diagnosis not present

## 2019-01-06 DIAGNOSIS — Z8669 Personal history of other diseases of the nervous system and sense organs: Secondary | ICD-10-CM

## 2019-01-06 NOTE — Progress Notes (Signed)
Neuropsychological Evaluation  Patient:  Cathy Barnes   DOB: Sep 27, 1940  MR Number: [id number]  Location: [place] [facility] PHYSICAL MEDICINE AND REHABILITATION [place], [id number] [place] [address] Dept: [phone]  Start: 10 AM End: 11 AM  Provider/Observer:     Edgardo Roys PsyD  Chief Complaint:      Chief Complaint  Patient presents with   Memory Loss   Headache    Reason For Service:     Cathy Barnes is a 78 year old female referred by Dr. Jaynee Eagles for neuropsychological evaluation due to concerns about memory difficulties in the context of a family history of dementia on both sides of her family.  The patient has a prior history of migraine, hypertension, and essential tremor.  The patient began seeing Dr. Erling Cruz for neurological treatment several years ago because of severe and recurrent migraine history.  The patient reports that her migraines have significantly improved over the years.  The patient began describing memory difficulties to her PCP approximately 4 years ago but even with this multiyear history of concerns over her memory functions they have not progressed on cognitive screening measures beyond mild impairments.  The patient reports that she wants to establish a baseline of cognitive functioning performance with concerns of potential progression in her memory difficulties.  The patient reports that sometimes she does not think as fast as she would like to and her cognitive efficiency clearly decreases as she is increasingly tired.  The patient did have some issues with hydrocephalus years ago that that was evaluated with a spinal tap but was never needed to be addressed surgically.  The patient's mother did end up having a shunt for the same reason.  The patient had an MRI conducted in both 2010 as well as 2016.  The impression provided of the 2016 MRI was 1 of an abnormal MRI  of the brain with no acute intracranial abnormality.  There was scattered T2/Flair hyperintensity chronic foci, predominantly in the subcortical and deep white matter of both hemispheres.  Dr. Felecia Shelling read this is a nonspecific finding and could be idiopathic or due to chronic microvascular ischemic changes, migraine or cardio emboli.  The pattern was not felt to be typical of demyelinization types of syndromes.  This MRI was compared to the MRI dated 2010 and showed mild progression in the overall number of foci.  The patient reports that she has blood work done every 6 months to monitor her red blood cell count, which has times where her red blood cell count is low at times.  She just had blood work last week.  The patient reports that her sleep pattern is disturbed at times and that she takes Ambien to go to sleep and when she takes her Ambien she falls right off to sleep and does describe a report that she feels rested in the morning.  Behavioral Observations: Appearance:Casually and appropriately dressed with good hygiene. Gait:Ambulated independently without assistance.  Speech:Normal rate, tone & volume Thought process: Logical, somewhat impulsive and disorganized.  Mood/Affect:Mild-moderately anxious, appropriate range.  Interpersonal: Polite and appropriate.  Orientation: O x 4 Effort/Motivation: Good   She had no difficulty hearing, seeing, or understanding test items. She appeared to get frustrated on questions she did not know or tasks that were more difficult but exhibited appropriate distress tolerance overall. She was cooperative with all assigned tasks and persisted well throughout the evaluation.   Tests Administered:  Animal Naming   [place] (BNT)  Trail Making Test (Part A & B)  Wechsler Adult Intelligence Scale, 4th Edition (WAIS-IV)  Wechsler Memory Scale, 4th edition, Older Adult Battery (WMS-IV-OA)   Test Results:   The patient appeared to try  her hardest throughout these testing procedures and this does appear to be a fair and valid estimation of her current cognitive and neuropsychological functioning.  Considering the patient's educational history and occupational history it is estimated that the patient has been functioning in the average to high average range historically with good social skills and executive functioning abilities.  Composite Score Summary  Scale Sum of Scaled Scores Composite Score Percentile Rank 95% Conf. Interval Qualitative Description  Verbal Comprehension 30 VCI 100 50 94-106 Average  Perceptual Reasoning 32 PRI 104 61 98-110 Average  Working Memory 17 WMI 92 30 86-99 Average  Processing Speed 17 PSI 92 30 84-101 Average  Full Scale 96 FSIQ 97 42 93-101 Average  General Ability 62 GAI 101 53 96-106 Average    The patient was initially administered the Wechsler Adult Intelligence Scale-IV.  This assessment produced a full scale IQ score of 97 which falls in the average range and at the 42nd percentile.  We also derive the patient's general abilities index score which puts less weight on measures that can change acutely and are less stable over time including working memory and information processing speed.  The patient produced a general abilities index score of 101 which falls in the average range and at the 53rd percentile.  Overall, there does appear to be some mild decrease with regard to auditory encoding and overall information processing speed but in general the patient is functioning in the average range and just slightly below where she would be predicted to be based on her educational and occupational history.   Verbal Comprehension Subtests Summary  Subtest Raw Score Scaled Score Percentile Rank Reference Group Scaled Score SEM  Similarities 24 11 63 10 1.12  Vocabulary 38 11 63 11 0.73  Information 9 8 25 8  0.73   The patient produced a verbal comprehension index score of 100 which falls at  the 50th percentile and is in the average range.  The patient did well on measures of verbal reasoning and problem-solving and her general vocabulary knowledge and capacity.  The patient had some mild relative weakness with regard to her general fund of information but this is not likely to be indicative of any acute or significant recent change.    Perceptual Reasoning Subtests Summary  Subtest Raw Score Scaled Score Percentile Rank Reference Group Scaled Score SEM  Block Design 24 9 37 6 1.27  Matrix Reasoning 12 11 63 6 0.73  Visual Puzzles 12 12 75 8 0.99  (Picture Completion) 13 13 84 9 1.12    The patient produced a perceptual reasoning index score of 104 which falls at the 61st percentile and is in the average range.  The patient performed in the average range with regard to visual reasoning and problem-solving, visual analysis and organizational abilities and visual judgment estimation.  The patient performed in the high average range with regard to her ability to identify visual anomalies within a visual gestalt.   Working Doctor, general practice Raw Score Scaled Score Percentile Rank Reference Group Scaled Score SEM  Digit Span 20 8 25 6  0.79  Arithmetic 11 9 37 8 0.95   The patient produced a working memory index score of 92  which falls at the 30th percentile and is in the average range.  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  However, the patient's performance was still in the average range and not indicative of any significant or profound change in her auditory encoding abilities.   Processing Speed Subtests Summary  Subtest Raw Score Scaled Score Percentile Rank Reference Group Scaled Score SEM  Symbol Search 18 9 37 5 1.12  Coding 37 8 25 4  1.12   The patient produced a processing speed index  score of 92 which falls at the 30th percentile and is also in the average range.  Individual subtest measuring focus execute abilities and visual information processing speed and visual scanning abilities were in the average to lower end of the average range relative to a normative population.  Given again, the patient's educational and occupational history this would suggest at most a mild decrease in overall information processing speed.  Index Score Summary  Index Sum of Scaled Scores Index Score Percentile Rank 95% Confidence Interval Qualitative Descriptor  Auditory Memory (AMI) 52 118 88 111-123 High Average  Visual Memory (VMI) 24 111 77 106-116 High Average  Immediate Memory (IMI) 36 112 79 105-118 High Average  Delayed Memory (DMI) 48 122 25 113-128 Superior   The patient was then administered the Wechsler memory scale-IV for older adult.  The first analysis is comparing auditory versus visual memory.  The patient did quite well on both auditory and visual memory measures and performed in the high average range on both.  There was some relative strengths for the patient and her auditory memory versus visual memory but both of these measures were in the high average range and equal to or exceeding estimations of premorbid functioning based on education and occupational history.  We then looked at immediate versus delayed memory functions.  The patient produced performances for both auditory and visual memory on the immediate memory index score that were in the high average range without any indication of any deficits learning new information.  These performances actually exceed will be predicted based on her previous encoding measures and strongly suggest that the patient is able to attend, organize, store and retrieve information after a short period of delay.  We then looked at more long-term memory/delayed memory functions.  The patient produced a delayed memory index score of 122 which falls  at the 93rd percentile and is in the superior range of functioning.  This is in fact the highest level of cognitive functioning on all measures assessed during this comprehensive objective assessment. ABILITY-MEMORY ANALYSIS  Ability Score:  GAI: 101 Date of Testing:  WAIS-IV; WMS-IV 2018/12/30  Predicted Difference Method   Index Predicted WMS-IV Index Score Actual WMS-IV Index Score Difference Critical Value  Significant Difference Y/N Base Rate  Auditory Memory 101 118 -17 9.16 Y 10%  Visual Memory 101 111 -10 8.46 Y 20-25%  Immediate Memory 101 112 -11 10.38 Y 15-20%  Delayed Memory 101 122 -21 12.01 Y 5%  Statistical significance (critical value) at the .01 level.    To further analyze the patient's memory functions we also analyzed her memory performances and and abilities-memory analysis where the patient's general abilities index score from the Wechsler Adult Intelligence Scale was used to produce a predicted score for various memory indices.  The patient produced a general abilities index  score of 101 which is in the average range and thus predicts the average performance on all memory test.  The patient's actual performance exceeded predicted levels on all assessed areas including visual and auditory memory, immediate memory and delayed memory.  The patient's delayed memory was a 21 point improvement over predicted levels.  Therefore there does not appear to be any indication of objective findings of memory deficits.   Animal Naming  Total=22, T=58, 79th %  [place]  Total (W/o Cue + Semantic Cue) =54/60, T=52, 58th %  The patient was also administered both measures of verbal fluency as well as challenge/targeted naming abilities to assess expressive language functioning capacity.  The patient did very well on measures of expressive language with her verbal fluency found to be at the 79th percentile relative to normative population and her challenge/targeted  names with both without cueing and somatic cueing following at the 58th percentile.  There do not appear to be any findings of deficits with regard to expressive language abilities.  Trail Making Test   Part A (Time=37, T=50, 50th %) expressive language functions,  Part B (Time=129, T=40, 16th %)  The patient was also administered the Trail Making Test to assess measures of information processing speed as well as adaptation and executive functioning for cognitive shifting abilities.  On the pure focus execute measure (Trail Making Test part A) the patient did well and performed at the 50th percentile.  When the measure adds a component of cognitive shifting between target stimuli the patient had some mild decrease in performance where she ended up performing at the 16th percentile.  This does indicate some mild decrease in shifting of attention and focus  Summary of Results:   Overall, the results of the current neuropsychological evaluation are not consistent with any significant pattern of cognitive decline or impairment.  The patient did show some mild decrease in auditory encoding and mild decrease in overall information processing speed, focus execute abilities, shifting attention.  However, the patient did exceptionally well on measures of memory both auditory and visual as well as immediate and delayed memory functions.  There were no indications of expressive language deficits.  The patient's verbal reasoning and problem-solving, social judgment and comprehension, visual-spatial abilities and visual analysis abilities were all in the average range and her memory functions were in the high average to superior range.  Again, other than some mild deficits with regard to encoding abilities, focus execute abilities as well as cognitive shifting all other measures were in the average to superior range of functioning relative to a normative population.  Impression/Diagnosis:   Overall, the results of  the current neuropsychological evaluation are not consistent with any patterns of significant cognitive decline or loss of function.  The patient shows no indications of patterns consistent with significant vascular dementia, degenerative cortical dementias or subcortical dementias.  In fact, the patient did exceptionally well on measures of memory and learning and also did well on visual and verbal reasoning and problem-solving, visual-spatial abilities, and expressive language functions.  The patient does have a history of sleep disturbance and anxiety that is been treated with both Ambien at night as well as use of Xanax during the day.  Both of these medicines have the potential to create some experience of memory difficulties and decline at times.  The patient's subjective symptoms of memory changes and difficulties are likely associated with both normal age-related cognitive changes as well as potential effects of benzodiazepine and or  benzo diazepam related medicines.  I will provide feedback to the patient regarding the current neuropsychological evaluation and these are in fact very good findings for the patient relative to her overall cognitive functioning.  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  Axis I: Mild cognitive impairment  History of sleep disturbance   Ilean Skill, Psy.D. Neuropsychologist       WAIS-IV Wechsler Adult Intelligence Scale-Fourth Edition Score Report   Examinee Name Cathy Barnes  Date of Report 2019/01/06  Justice Rocher [id number]  Years of Education   Date of Birth 01/02/1941  Primary Language   Gender Female  Handedness   Race/Ethnicity   Examiner Name Ilean Skill  Date of Testing 2018/12/30  Age at Testing 56 years 9 months  Retest? No   Comments: Composite Score Summary  Scale Sum  of Scaled Scores Composite Score Percentile Rank 95% Conf. Interval Qualitative Description  Verbal Comprehension 30 VCI 100 50 94-106 Average  Perceptual Reasoning 32 PRI 104 61 98-110 Average  Working Memory 17 WMI 92 30 86-99 Average  Processing Speed 17 PSI 92 30 84-101 Average  Full Scale 96 FSIQ 97 42 93-101 Average  General Ability 62 GAI 101 53 96-106 Average    ANALYSIS   Index Level Discrepancy Comparisons  Comparison Score 1 Score 2 Difference Critical Value .05 Significant Difference Y/N Base Rate by Overall Sample  VCI - PRI 100 104 -4 9.29 N 39.7  VCI - WMI 100 92 8 8.81 N 27.1  VCI - PSI 100 92 8 10.18 N 31.5  PRI - WMI 104 92 12 9.74 Y 18.4  PRI - PSI 104 92 12 10.99 Y 21.8  WMI - PSI 92 92 0 10.59 N   FSIQ - GAI 97 101 -4 3.34 Y 23.8    Verbal Comprehension Subtests Summary  Subtest Raw Score Scaled Score Percentile Rank Reference Group Scaled Score SEM  Similarities 24 11 63 10 1.12  Vocabulary 38 11 63 11 0.73  Information 9 8 25 8  0.73   Perceptual Reasoning Subtests Summary  Subtest Raw Score Scaled Score Percentile Rank Reference Group Scaled Score SEM  Block Design 24 9 37 6 1.27  Matrix Reasoning 12 11 63 6 0.73  Visual Puzzles 12 12 75 8 0.99  (Picture Completion) 13 13 84 9 1.12   Working Doctor, general practice Raw Score Scaled Score Percentile Rank Reference Group Scaled Score SEM  Digit Span 20 8 25 6  0.79  Arithmetic 11 9 37 8 0.95   Processing Speed Subtests Summary  Subtest Raw Score Scaled Score Percentile Rank Reference Group Scaled Score SEM  Symbol Search 18 9 37 5 1.12  Coding 37 8 25 4  1.12   Subtest Level Discrepancy Comparisons  Subtest Comparison Score 1 Score 2 Difference Critical Value .05 Significant Difference Y/N Base Rate  Digit Span - Arithmetic 8 9 -1 2.57 N 42.20  Symbol Search - Coding 9 8 1  3.41 N 42.60    DETERMINING STRENGTHS AND WEAKNESSES   Differences Between Subtest and Overall Mean of  Subtest Scores  Subtest Subtest Scaled Score Mean Scaled Score Difference Critical Value .05 Strength or Weakness Base Rate  Block Design 9 9.60 -0.60 2.85  >25%  Similarities 11 9.60 1.40 2.82  >25%  Digit Span 8 9.60 -1.60 2.22  >25%  Matrix Reasoning 11 9.60 1.40 2.54  >  25%  Vocabulary 11 9.60 1.40 2.03  >25%  Arithmetic 9 9.60 -0.60 2.73  >25%  Symbol Search 9 9.60 -0.60 3.42  >25%  Visual Puzzles 12 9.60 2.40 2.71  >25%  Information 8 9.60 -1.60 2.19  >25%  Coding 8 9.60 -1.60 2.97  >25%  Overall: Mean = 9.60, Scatter =  4, Base rate =  96.1 Base Rate for Intersubtest Scatter is reported for 10 Subtests. Statistical significance (critical value) at the .05 level.   PROCESS ANALYSIS   Perceptual Reasoning Process Score Summary  Process Score Raw Score Scaled Score Percentile Rank SEM  Block Design No Time Bonus 24 9 37 1.27    Working Memory Process Score Summary  Process Score Raw Score Scaled Score Percentile Rank Base Rate SEM  Digit Span Forward 9 9 37 -- 1.31  Digit Span Backward 6 8 25  -- 1.34  Digit Span Sequencing 5 8 25  -- 1.12  Longest Digit Span Forward 6 -- -- 69.0 --  Longest Digit Span Backward 3 -- -- 95.0 --  Longest Digit Span Sequence 4 -- -- 84.0 --    Process Level Discrepancy Comparisons  Process Comparison Score 1 Score 2 Difference Critical Value .05 Significant Difference Y/N Base Rate  Block Design - Block Design No Time Bonus 9 9 0 3.08 N   Digit Span Forward - Digit Span Backward 9 8 1  3.65 N 39.8  Digit Span Forward - Digit Span Sequencing 9 8 1  3.60 N 43.2  Digit Span Backward - Digit Span Sequencing 8 8 0 3.56 N   Longest Digit Span Forward - Longest Digit Span Backward 6 3 3  -- -- 27.0  Longest Digit Span Forward - Longest Digit Span Sequence 6 4 2  -- -- 43.0  Longest Digit Span Backward - Longest Digit Span Sequence 3 4 -1 -- -- 55.0  Statistical significance (critical value) at the .05 level.   Raw Scores  Subtest  Score Range Raw Score  Process Score Range Raw Score  Block Design 0-66 24  Block Design No Time Bonus 0-48 24  Similarities 0-36 24  Digit Span Forward 0-16 9  Digit Span 0-48 20  Digit Span Backward 0-16 6  Matrix Reasoning 0-26 12  Digit Span Sequencing 0-16 5  Vocabulary 0-57 38  Longest Digit Span Forward 0, 2-9 6  Arithmetic 0-22 11  Longest Digit Span Backward 0, 2-8 3  Symbol Search 0-60 18  Longest Digit Span Sequence 0, 2-9 4  Visual Puzzles 0-26 12  Longest Letter-Number Seq. 0, 2-8   Information 0-26 9      Coding 0-135 37      Letter-Number Seq. 0-30       Figure Weights 0-27       Comprehension 0-36       Cancellation 0-72       Picture Completion 0-24 13          WMS-IV Wechsler Memory Scale-Fourth Edition Score Report   Examinee Name Cathy Barnes  Date of Report 2019/01/06  Cathy Barnes ID [id number]  Years of Education 53  Date of Birth 12-21-1940  Home Language   Gender Female  Handedness Not Specified  Race/Ethnicity   Examiner Name Ilean Skill  Date of Testing 2018/12/30  Age at Testing 75 years 9 months  Retest? No   Comments: Brief Cognitive Status Exam Classification  Age Years of Education Raw Score Classification Level Base Rate  77 years 9 months 13 49 Average 100.0  Index Score Summary  Index Sum of Scaled Scores Index Score Percentile Rank 95% Confidence Interval Qualitative Descriptor  Auditory Memory (AMI) 52 118 88 111-123 High Average  Visual Memory (VMI) 24 111 77 106-116 High Average  Immediate Memory (IMI) 36 112 79 105-118 High Average  Delayed Memory (DMI) 40 122 93 113-128 Superior    Index Score Profile  The vertical bars represent the standard error of measurement (SEM).   Primary Subtest Scaled Score Summary  Subtest Domain Raw Score Scaled Score Percentile Rank  Logical Memory I AM 39 14 91  Logical Memory II AM 26 14 91  Verbal Paired Associates I AM 24 12 75  Verbal Paired Associates II AM 7 12 75  Visual  Reproduction I VM 27 10 50  Visual Reproduction II VM 28 14 91  Symbol Span VWM 18 12 75   Primary Subtest Scaled Score Profile    PROCESS SCORE CONVERSIONS  Auditory Memory Process Score Summary  Process Score Raw Score Scaled Score Percentile Rank Cumulative Percentage (Base Rate)  LM II Recognition 21 - - >75%  VPA II Recognition 28 - - 51-75%   Visual Memory Process Score Summary  Process Score Raw Score Scaled Score Percentile Rank Cumulative Percentage (Base Rate)  VR II Recognition 5 - - 51-75%    SUBTEST-LEVEL DIFFERENCES WITHIN INDEXES  Auditory Memory Index  Subtest Scaled Score AMI Mean Score Difference from Mean Critical Value Base Rate  Logical Memory I 14 13.00 1.00 2.45 >25%  Logical Memory II 14 13.00 1.00 2.38 >25%  Verbal Paired Associates I 12 13.00 -1.00 2.04 >25%  Verbal Paired Associates II 12 13.00 -1.00 3.06 >25%  Statistical significance (critical value) at the .05 level.   Immediate Memory Index  Subtest Scaled Score IMI Mean Score Difference from Mean Critical Value Base Rate  Logical Memory I 14 12.00 2.00 2.01 25%  Verbal Paired Associates I 12 12.00 0.00 1.71 >25%  Visual Reproduction I 10 12.00 -2.00 1.71 >25%  Statistical significance (critical value) at the .05 level.   Delayed Memory Index  Subtest Scaled Score DMI Mean Score Difference from Mean Critical Value Base Rate  Logical Memory II 14 13.33 0.67 2.15 >25%  Verbal Paired Associates II 12 13.33 -1.33 2.63 >25%  Visual Reproduction II 14 13.33 0.67 1.77 >25%  Statistical significance (critical value) at the .05 level.    SUBTEST DISCREPANCY COMPARISON  Comparison Score 1 Score 2 Difference Critical Value Base Rate  Visual Reproduction I - Visual Reproduction II 10 14 -4 1.99 17.2  Statistical significance (critical value) at the .05 level.    SUBTEST-LEVEL CONTRAST SCALED SCORES  Logical Memory  Score Score 1 Score 2 Contrast Scaled Score  LM II Recognition vs.  Delayed Recall >75% 14 13  LM Immediate Recall vs. Delayed Recall 14 14 11    Verbal Paired Associates  Score Score 1 Score 2 Contrast Scaled Score  VPA II Recognition vs. Delayed Recall 51-75% 12 12  VPA Immediate Recall vs. Delayed Recall 12 12 10    Visual Reproduction  Score Score 1 Score 2 Contrast Scaled Score  VR II Recognition vs. Delayed Recall 51-75% 14 14  VR Immediate Recall vs. Delayed Recall 10 14 15     INDEX-LEVEL CONTRAST SCALED SCORES  WMS-IV Indexes  Score Score 1 Score 2 Contrast Scaled Score  Auditory Memory Index vs. Visual Memory Index 118 111 12  Immediate Memory Index vs. Delayed Memory Index 112 122 14    RAW SCORES  Subtest Score Range Adult Score Range Older Adult Raw Score  Brief Cognitive Status Exam 0-58 0-58 49  Logical Memory I 0-50 0-53 39  Logical Memory II 0-50 0-39 26  Verbal Paired Associates I 0-56 0-40 24  Verbal Paired Associates II 0-14 0-10 7  CVLT-II Trials 1-5 5-95 5-95   CVLT-II Long-Delay -5-5 -5-5   Designs I 0-120    Designs II 0-120    Visual Reproduction I 0-43 0-43 27  Visual Reproduction II 0-43 0-43 28  Spatial Addition 0-24    Symbol Span 0-50 0-50 18  Process Score Range Adult Score Range Older Adult Raw Score  LM II Recognition 0-30 0-23 21  VPA II Recognition 0-40 0-30 28  VPA II Word Recall 0-28 0-20   DE I Content 0-48    DE I Spatial 0-24    DE II Content 0-48    DE II Spatial 0-24    DE II Recognition 0-24    VR II Recognition 0-7 0-7 5  VR II Copy 0-43 0-43     ABILITY-MEMORY ANALYSIS  Ability Score:  GAI: 101 Date of Testing:  WAIS-IV; WMS-IV 2018/12/30  Predicted Difference Method   Index Predicted WMS-IV Index Score Actual WMS-IV Index Score Difference Critical Value  Significant Difference Y/N Base Rate  Auditory Memory 101 118 -17 9.16 Y 10%  Visual Memory 101 111 -10 8.46 Y 20-25%  Immediate Memory 101 112 -11 10.38 Y 15-20%  Delayed Memory 101 122 -21 12.01 Y 5%  Statistical  significance (critical value) at the .01 level.   Contrast Scaled Scores  Score Score 1 Score 2 Contrast Scaled Score  General Ability Index vs. Auditory Memory Index 101 118 14  General Ability Index vs. Visual Memory Index 101 111 12  General Ability Index vs. Immediate Memory Index 101 112 13  General Ability Index vs. Delayed Memory Index 101 122 16  Verbal Comprehension Index vs. Auditory Memory Index 100 118 14  Perceptual Reasoning Index vs. Visual Memory Index 104 111 12  Working Memory Index vs. Auditory Memory Index 92 118 15    End of Report

## 2019-01-11 ENCOUNTER — Other Ambulatory Visit: Payer: Self-pay | Admitting: Neurology

## 2019-01-11 ENCOUNTER — Other Ambulatory Visit: Payer: Self-pay | Admitting: Physician Assistant

## 2019-01-11 DIAGNOSIS — I10 Essential (primary) hypertension: Secondary | ICD-10-CM

## 2019-01-18 ENCOUNTER — Other Ambulatory Visit: Payer: Self-pay | Admitting: Physician Assistant

## 2019-01-18 DIAGNOSIS — E7800 Pure hypercholesterolemia, unspecified: Secondary | ICD-10-CM

## 2019-01-20 ENCOUNTER — Encounter: Payer: Medicare Other | Attending: Psychology | Admitting: Psychology

## 2019-01-20 ENCOUNTER — Other Ambulatory Visit: Payer: Self-pay

## 2019-01-20 DIAGNOSIS — Z87898 Personal history of other specified conditions: Secondary | ICD-10-CM | POA: Diagnosis not present

## 2019-01-20 DIAGNOSIS — G479 Sleep disorder, unspecified: Secondary | ICD-10-CM | POA: Insufficient documentation

## 2019-01-20 DIAGNOSIS — Z8669 Personal history of other diseases of the nervous system and sense organs: Secondary | ICD-10-CM

## 2019-01-20 DIAGNOSIS — G3184 Mild cognitive impairment, so stated: Secondary | ICD-10-CM

## 2019-01-20 DIAGNOSIS — F419 Anxiety disorder, unspecified: Secondary | ICD-10-CM | POA: Insufficient documentation

## 2019-01-20 DIAGNOSIS — R413 Other amnesia: Secondary | ICD-10-CM | POA: Insufficient documentation

## 2019-02-13 ENCOUNTER — Other Ambulatory Visit: Payer: Self-pay | Admitting: *Deleted

## 2019-02-13 MED ORDER — CYCLOBENZAPRINE HCL 10 MG PO TABS: ORAL_TABLET | ORAL | 6 refills | Status: DC

## 2019-02-13 NOTE — Telephone Encounter (Signed)
Received a paper refill request from [facility].  

## 2019-02-14 ENCOUNTER — Other Ambulatory Visit: Payer: Self-pay | Admitting: Physician Assistant

## 2019-02-14 DIAGNOSIS — G4700 Insomnia, unspecified: Secondary | ICD-10-CM

## 2019-02-21 ENCOUNTER — Encounter: Payer: Self-pay | Admitting: Psychology

## 2019-02-21 NOTE — Progress Notes (Signed)
Today I provided feedback regarding the results of the recent neuropsychological evaluation.  The full report can be found in her EMR visit dated 01/06/2019.  This was a 1 hour feedback session that consisted of myself and the patient for an in person visit that was conducted in my outpatient clinic office.  Below is the summary of the recent evaluation.  Summary of Results:                                    Overall, the results of the current neuropsychological evaluation are not consistent with any significant pattern of cognitive decline or impairment.  The patient did show some mild decrease in auditory encoding and mild decrease in overall information processing speed, focus execute abilities, shifting attention.  However, the patient did exceptionally well on measures of memory both auditory and visual as well as immediate and delayed memory functions.  There were no indications of expressive language deficits.  The patient's verbal reasoning and problem-solving, social judgment and comprehension, visual-spatial abilities and visual analysis abilities were all in the average range and her memory functions were in the high average to superior range.  Again, other than some mild deficits with regard to encoding abilities, focus execute abilities as well as cognitive shifting all other measures were in the average to superior range of functioning relative to a normative population.  Impression/Diagnosis:                                 Overall, the results of the current neuropsychological evaluation are not consistent with any patterns of significant cognitive decline or loss of function.  The patient shows no indications of patterns consistent with significant vascular dementia, degenerative cortical dementias or subcortical dementias.  In fact, the patient did exceptionally well on measures of memory and learning and also did well on visual and verbal reasoning and problem-solving, visual-spatial  abilities, and expressive language functions.  The patient does have a history of sleep disturbance and anxiety that is been treated with both Ambien at night as well as use of Xanax during the day.  Both of these medicines have the potential to create some experience of memory difficulties and decline at times.  The patient's subjective symptoms of memory changes and difficulties are likely associated with both normal age-related cognitive changes as well as potential effects of benzodiazepine and or benzo diazepam related medicines.  I will provide feedback to the patient regarding the current neuropsychological evaluation and these are in fact very good findings for the patient relative to her overall cognitive functioning.  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  Axis I: Mild cognitive impairment  History of sleep disturbance   Ilean Skill, Psy.D. Neuropsychologist

## 2019-03-06 ENCOUNTER — Other Ambulatory Visit: Payer: Self-pay | Admitting: *Deleted

## 2019-03-06 MED ORDER — VERAPAMIL HCL ER 120 MG PO CP24: ORAL_CAPSULE | ORAL | 1 refills | Status: DC

## 2019-03-07 ENCOUNTER — Other Ambulatory Visit: Payer: Self-pay | Admitting: Physician Assistant

## 2019-03-07 DIAGNOSIS — G588 Other specified mononeuropathies: Secondary | ICD-10-CM

## 2019-03-14 ENCOUNTER — Ambulatory Visit: Payer: Medicare Other | Admitting: Psychology

## 2019-03-25 ENCOUNTER — Other Ambulatory Visit: Payer: Self-pay | Admitting: Physician Assistant

## 2019-03-25 DIAGNOSIS — I10 Essential (primary) hypertension: Secondary | ICD-10-CM

## 2019-03-25 NOTE — Telephone Encounter (Signed)
Requested medication (s) are due for refill today  Yes  Requested medication (s) are on the active medication list Yes Future visit scheduled   No  Last OV   06/06/18  Requested Prescriptions  Pending Prescriptions Disp Refills   losartan (COZAAR) 50 MG tablet [Pharmacy Med Name: LOSARTAN 50MG  TABLETS] 90 tablet 0    Sig: TAKE 1 TABLET BY MOUTH AT BEDTIME     Cardiovascular:  Angiotensin Receptor Blockers Failed - 03/25/2019  1:17 PM      Failed - Cr in normal range and within 180 days    Creatinine, Ser  Date Value Ref Range Status  02/02/2018 1.15 (H) 0.57 - 1.00 mg/dL Final         Failed - K in normal range and within 180 days    Potassium  Date Value Ref Range Status  03/07/2018 3.8 3.5 - 5.1 mmol/L Final    Comment:    Performed at [facility], [address] [place], [place] [address]         Failed - Valid encounter within last 6 months    Recent Outpatient Visits          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

## 2019-03-31 ENCOUNTER — Telehealth: Payer: Self-pay | Admitting: Physician Assistant

## 2019-03-31 DIAGNOSIS — R109 Unspecified abdominal pain: Secondary | ICD-10-CM

## 2019-03-31 NOTE — Telephone Encounter (Signed)
HYDROcodone-Acetaminophen 5-300 MG TABS    Patient is requesting refill of this medication.    Pharmacy:  [facility] [place], [place] - [address] [place] Phone:  [phone]  Fax:  [phone]

## 2019-04-03 ENCOUNTER — Ambulatory Visit: Payer: Medicare Other | Admitting: Psychology

## 2019-04-03 MED ORDER — HYDROCODONE-ACETAMINOPHEN 5-300 MG PO TABS: 1.0000 | ORAL_TABLET | Freq: Two times a day (BID) | ORAL | 0 refills | Status: DC | PRN

## 2019-04-03 NOTE — Telephone Encounter (Signed)
This is something that has not been filled since 09/2017. What is she needing this for at this time?

## 2019-04-03 NOTE — Telephone Encounter (Signed)
Per patient she still has the pain on her right side and she doesn't take the medicine often only when she needs it when worsening. She reports the pain is there all the time just sometimes is worse.

## 2019-04-03 NOTE — Telephone Encounter (Signed)
Refilled for her 

## 2019-04-03 NOTE — Telephone Encounter (Signed)
From [facility] 

## 2019-04-03 NOTE — Telephone Encounter (Signed)
Pharmacy called and they just wanted to informed us that they will received the medicine until Thursday.

## 2019-04-06 ENCOUNTER — Ambulatory Visit: Payer: Medicare Other | Admitting: Psychology

## 2019-05-22 ENCOUNTER — Other Ambulatory Visit: Payer: Self-pay | Admitting: Physician Assistant

## 2019-05-22 DIAGNOSIS — F5101 Primary insomnia: Secondary | ICD-10-CM

## 2019-05-22 NOTE — Telephone Encounter (Signed)
Requested medication (s) are due for refill today: yes  Requested medication (s) are on the active medication list: yes  Last refill:  02/24/2019  Future visit scheduled: no  Notes to clinic:  this refill cannot be delegated  No valid encounter within last 6 months   Requested Prescriptions  Pending Prescriptions Disp Refills   zolpidem (AMBIEN) 10 MG tablet [Pharmacy Med Name: ZOLPIDEM 10MG  TABLETS] 90 tablet     Sig: TAKE 1 TABLET BY MOUTH EVERY DAY AT BEDTIME      Not Delegated - Psychiatry:  Anxiolytics/Hypnotics Failed - 05/22/2019  3:27 AM      Failed - This refill cannot be delegated      Failed - Urine Drug Screen completed in last 360 days.      Failed - Valid encounter within last 6 months    Recent Outpatient Visits           11 months ago Intercostal neuralgia   [place], [place]   1 year ago Abdominal distension (gaseous)   [facility] [place], [place], [place]   1 year ago Blepharitis of right upper eyelid, unspecified type   [place], [place]   1 year ago Rib pain on right side   [place], [place]   1 year ago Rib pain on right side   [facility] [place], [place], [place]

## 2019-06-02 ENCOUNTER — Other Ambulatory Visit: Payer: Self-pay | Admitting: Physician Assistant

## 2019-06-02 DIAGNOSIS — N3281 Overactive bladder: Secondary | ICD-10-CM

## 2019-06-05 ENCOUNTER — Other Ambulatory Visit: Payer: Self-pay | Admitting: Physician Assistant

## 2019-06-05 DIAGNOSIS — G588 Other specified mononeuropathies: Secondary | ICD-10-CM

## 2019-06-05 NOTE — Telephone Encounter (Signed)
Requested medication (s) are due for refill today: yes  Requested medication (s) are on the active medication list: yes  Last refill:  05/05/19  Future visit scheduled: no  Notes to clinic:  no valid encounter within last 6 months  Requested Prescriptions  Pending Prescriptions Disp Refills   gabapentin (NEURONTIN) 600 MG tablet [Pharmacy Med Name: GABAPENTIN 600MG  TABLETS] 60 tablet 2    Sig: TAKE 1/2 TABLET BY MOUTH AT BREAKFAST, 1/2 AT LUNCH, AND 1 AT BEDTIME      Neurology: Anticonvulsants - gabapentin Failed - 06/05/2019  3:29 AM      Failed - Valid encounter within last 12 months    Recent Outpatient Visits           12 months ago Intercostal neuralgia   [place], [place], [place]   1 year ago Abdominal distension (gaseous)   [place], [place], [place]   1 year ago Blepharitis of right upper eyelid, unspecified type   [place], [place]   1 year ago Rib pain on right side   [place], [place]   1 year ago Rib pain on right side   [facility] [place], [place], [place]

## 2019-06-06 ENCOUNTER — Other Ambulatory Visit: Payer: Self-pay | Admitting: Neurology

## 2019-06-16 ENCOUNTER — Other Ambulatory Visit: Payer: Self-pay

## 2019-06-16 ENCOUNTER — Inpatient Hospital Stay: Payer: Medicare PPO

## 2019-06-16 ENCOUNTER — Inpatient Hospital Stay: Payer: Medicare PPO | Attending: Oncology | Admitting: Oncology

## 2019-06-16 DIAGNOSIS — D7219 Other eosinophilia: Secondary | ICD-10-CM | POA: Insufficient documentation

## 2019-06-16 DIAGNOSIS — C9110 Chronic lymphocytic leukemia of B-cell type not having achieved remission: Secondary | ICD-10-CM | POA: Insufficient documentation

## 2019-06-16 DIAGNOSIS — Z7982 Long term (current) use of aspirin: Secondary | ICD-10-CM | POA: Insufficient documentation

## 2019-06-16 DIAGNOSIS — Z8249 Family history of ischemic heart disease and other diseases of the circulatory system: Secondary | ICD-10-CM | POA: Diagnosis not present

## 2019-06-16 DIAGNOSIS — Z90710 Acquired absence of both cervix and uterus: Secondary | ICD-10-CM | POA: Insufficient documentation

## 2019-06-16 DIAGNOSIS — E785 Hyperlipidemia, unspecified: Secondary | ICD-10-CM | POA: Insufficient documentation

## 2019-06-16 DIAGNOSIS — E039 Hypothyroidism, unspecified: Secondary | ICD-10-CM | POA: Diagnosis not present

## 2019-06-16 DIAGNOSIS — Z79899 Other long term (current) drug therapy: Secondary | ICD-10-CM

## 2019-06-16 DIAGNOSIS — Z800 Family history of malignant neoplasm of digestive organs: Secondary | ICD-10-CM | POA: Diagnosis not present

## 2019-06-16 DIAGNOSIS — K219 Gastro-esophageal reflux disease without esophagitis: Secondary | ICD-10-CM

## 2019-06-16 DIAGNOSIS — I10 Essential (primary) hypertension: Secondary | ICD-10-CM | POA: Insufficient documentation

## 2019-06-16 LAB — CBC WITH DIFFERENTIAL/PLATELET
Abs Immature Granulocytes: 0.07 10*3/uL (ref 0.00–0.07)
Basophils Absolute: 0.1 10*3/uL (ref 0.0–0.1)
Basophils Relative: 0 %
Eosinophils Absolute: 0.9 10*3/uL — ABNORMAL HIGH (ref 0.0–0.5)
Eosinophils Relative: 6 %
HCT: 42.4 % (ref 36.0–46.0)
Hemoglobin: 13.4 g/dL (ref 12.0–15.0)
Immature Granulocytes: 0 %
Lymphocytes Relative: 56 %
Lymphs Abs: 8.8 10*3/uL — ABNORMAL HIGH (ref 0.7–4.0)
MCH: 29.4 pg (ref 26.0–34.0)
MCHC: 31.6 g/dL (ref 30.0–36.0)
MCV: 93 fL (ref 80.0–100.0)
Monocytes Absolute: 1.5 10*3/uL — ABNORMAL HIGH (ref 0.1–1.0)
Monocytes Relative: 10 %
Neutro Abs: 4.5 10*3/uL (ref 1.7–7.7)
Neutrophils Relative %: 28 %
Platelets: 298 10*3/uL (ref 150–400)
RBC: 4.56 MIL/uL (ref 3.87–5.11)
RDW: 14.1 % (ref 11.5–15.5)
WBC: 15.8 10*3/uL — ABNORMAL HIGH (ref 4.0–10.5)
nRBC: 0 % (ref 0.0–0.2)

## 2019-06-16 LAB — COMPREHENSIVE METABOLIC PANEL
ALT: 26 U/L (ref 0–44)
AST: 26 U/L (ref 15–41)
Albumin: 3.6 g/dL (ref 3.5–5.0)
Alkaline Phosphatase: 92 U/L (ref 38–126)
Anion gap: 11 (ref 5–15)
BUN: 20 mg/dL (ref 8–23)
CO2: 26 mmol/L (ref 22–32)
Calcium: 8.9 mg/dL (ref 8.9–10.3)
Chloride: 103 mmol/L (ref 98–111)
Creatinine, Ser: 1.11 mg/dL — ABNORMAL HIGH (ref 0.44–1.00)
GFR calc Af Amer: 55 mL/min — ABNORMAL LOW (ref >60)
GFR calc non Af Amer: 48 mL/min — ABNORMAL LOW (ref >60)
Glucose, Bld: 146 mg/dL — ABNORMAL HIGH (ref 70–99)
Potassium: 3.8 mmol/L (ref 3.5–5.1)
Sodium: 140 mmol/L (ref 135–145)
Total Bilirubin: 0.4 mg/dL (ref 0.3–1.2)
Total Protein: 6.8 g/dL (ref 6.5–8.1)

## 2019-06-19 NOTE — Progress Notes (Signed)
I connected with Cathy Barnes on 06/19/19 at  1:00 PM EST by video enabled telemedicine visit and verified that I am speaking with the correct person using two identifiers.   I discussed the limitations, risks, security and privacy concerns of performing an evaluation and management service by telemedicine and the availability of in-person appointments. I also discussed with the patient that there may be a patient responsible charge related to this service. The patient expressed understanding and agreed to proceed.  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 She has been sent to [place] for evaluation of leukocytosis. Over the last 1 year patient's white count has been around 12 with predominantly lymphocytosis and monocytosis as well as eosinophilia. No evidence of anemia or thrombocytopenia. Overall patient is doing well and denies any complaints of fevers, chills, unintentional weight loss, fatigue or night sweats. Denies any lumps or bumps anywhere  Further blood work from 07-2016 was as follows: CBC showed white count of 12.9 with an absolute lymphocyte count of 6.6 and an eosinophilia of 1.1. H&H was 13.4/41.6 and a platelet count of 270. CMP was unremarkable. Review of peripheral smear showed mild leukocytosis with absolute lymphocytosis and eosinophilia. BCR able testing was negative for CML. Flow cytometry showed CD5 positive, CD23 positive clonal B-cell population CLL/SLL phenotype CD38  negative. 36% of leukocytes are less than 5000/mcL. eosinophilia  Interval history : Patient reports that her appetite and weight are stable.  Denies any drenching night sweats.  Denies any unintentional weight loss or fatigue.  Denies any lumps or bumps anywhere.  Review of Systems  Constitutional: Negative for chills, fever, malaise/fatigue and weight loss.  HENT: Negative for congestion, ear discharge and nosebleeds.   Eyes: Negative for blurred vision.  Respiratory: Negative for cough, hemoptysis, sputum production, shortness of breath and wheezing.   Cardiovascular: Negative for chest pain, palpitations, orthopnea and claudication.  Gastrointestinal: Negative for abdominal pain, blood in stool, constipation, diarrhea, heartburn, melena, nausea and vomiting.  Genitourinary: Negative for dysuria, flank pain, frequency, hematuria and urgency.  Musculoskeletal: Negative for back pain, joint pain and myalgias.  Skin: Negative for rash.  Neurological: Negative for dizziness, tingling, focal weakness, seizures, weakness and headaches.  Endo/Heme/Allergies: Does not bruise/bleed easily.  Psychiatric/Behavioral: Negative for depression and suicidal ideas. The patient does not have insomnia.     Allergies  Allergen Reactions  . Shellfish Allergy     Throat closes, rash  . Latex Rash    Tapes,adhesives    Past Medical History:  Diagnosis Date  . Allergy    some  . Cataract    bilaterally both eyes   . 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 Gallstones   . GERD (gastroesophageal reflux disease)   . Hydrocephalus ([place])    [facility]   . Hyperglycemia   . Hyperlipidemia   . Hypertension   . Lymphocytosis    monoclonal b cell  . Migraine, unspecified, without mention of intractable migraine without mention of status migrainosus 12/14/2012  .  Osteoarthritis, chronic   . Osteoporosis   . Peptic ulcer    some bleeding with ulcers as well     Past Surgical  History:  Procedure Laterality Date  . ABDOMINAL HYSTERECTOMY    . BREAST CYST ASPIRATION Left 1993   Removed  . breast cyst removed Right    benign   . BREAST LUMPECTOMY Bilateral   . COLONOSCOPY    . ERCP N/A 02/04/2018   Procedure: ENDOSCOPIC RETROGRADE CHOLANGIOPANCREATOGRAPHY (ERCP);  Surgeon: Lucilla Lame, MD;  Location: [facility] ENDOSCOPY;  Service: Endoscopy;  Laterality: N/A;  . ERCP N/A 02/28/2018   Procedure: ENDOSCOPIC RETROGRADE CHOLANGIOPANCREATOGRAPHY (ERCP) STENT REMOVAL;  Surgeon: Lucilla Lame, MD;  Location: [facility] ENDOSCOPY;  Service: Endoscopy;  Laterality: N/A;  . EYE SURGERY    . growth removal Left 2019   L wrist growth removed, abnormal cells  . History of Cataract surgery Right 2011   left 2011  . PARTIAL HYSTERECTOMY    . POLYPECTOMY    . ROBOTIC ASSISTED LAPAROSCOPIC CHOLECYSTECTOMY-MULTI SITE N/A 03/08/2018   Procedure: ROBOTIC ASSISTED LAPAROSCOPIC CHOLECYSTECTOMY-MULTI SITE;  Surgeon: Jules Husbands, MD;  Location: [facility] ORS;  Service: General;  Laterality: N/A;  . S/P ACL repair Left    knee  . TONSILLECTOMY    . TUBAL LIGATION  1975   Bilateral    Social History   Socioeconomic History  . Marital status: Divorced    Spouse name: Not on file  . Number of children: 1  . Years of education: college  . Highest education level: Not on file  Occupational History  . Occupation: book Production designer, theatre/television/film: OTHER    Comment: Paediatric nurse  Tobacco Use  . Smoking status: Never Smoker  . Smokeless tobacco: Never Used  Substance and Sexual Activity  . Alcohol use: Yes    Alcohol/week: 1.0 standard drinks    Types: 1 Glasses of wine per week    Comment: Rare- wine on occasion  . Drug use: No  . Sexual activity: Not Currently  Other Topics Concern  . Not on file  Social History Narrative   Patient is right handed, resides alone. Divorced.   Social Determinants of Health   Financial Resource Strain:   . Difficulty of Paying Living Expenses:  Not on file  Food Insecurity:   . Worried About Charity fundraiser in the Last Year: Not on file  . Ran Out of Food in the Last Year: Not on file  Transportation Needs:   . Lack of Transportation (Medical): Not on file  . Lack of Transportation (Non-Medical): Not on file  Physical Activity:   . Days of Exercise per Week: Not on file  . Minutes of Exercise per Session: Not on file  Stress:   . Feeling of Stress : Not on file  Social Connections:   . Frequency of Communication with Friends and Family: Not on file  . Frequency of Social Gatherings with Friends and Family: Not on file  . Attends Religious Services: Not on file  . Active Member of Clubs or Organizations: Not on file  . Attends Archivist Meetings: Not on file  . Marital Status: Not on file  Intimate Partner Violence:   . Fear of Current or Ex-Partner: Not on file  . Emotionally Abused: Not on file  . Physically Abused: Not on file  . Sexually Abused: Not on file    Family History  Problem Relation Age of Onset  . Colon cancer  Mother   . Hypertension Mother   . Colon cancer Father   . Heart disease Father   . Hypertension Father   . Colon polyps Neg Hx   . Rectal cancer Neg Hx   . Stomach cancer Neg Hx      Current Outpatient Medications:  .  zolpidem (AMBIEN) 10 MG tablet, TAKE 1 TABLET BY MOUTH EVERY DAY AT BEDTIME, Disp: 90 tablet, Rfl: 1 .  ALPRAZolam (XANAX) 0.25 MG tablet, TAKE 2 TABLETS(0.5 MG) BY MOUTH AT BEDTIME, Disp: 180 tablet, Rfl: 1 .  aspirin 81 MG tablet, Take 81 mg by mouth daily., Disp: , Rfl:  .  atenolol (TENORMIN) 50 MG tablet, TAKE 1 TABLET BY MOUTH EVERY DAY, Disp: 90 tablet, Rfl: 1 .  Cholecalciferol (VITAMIN D3) 1.25 MG (50000 UT) CAPS, TAKE 1 CAPSULE BY MOUTH EVERY MONTH AS DIRECTED, Disp: 4 capsule, Rfl: 5 .  cyclobenzaprine (FLEXERIL) 10 MG tablet, TAKE 1 TABLET(10 MG) BY MOUTH AT BEDTIME, Disp: 30 tablet, Rfl: 6 .  donepezil (ARICEPT) 10 MG tablet, TAKE 1 TABLET(10 MG)  BY MOUTH AT BEDTIME, Disp: 90 tablet, Rfl: 6 .  ezetimibe (ZETIA) 10 MG tablet, TAKE 1 TABLET BY MOUTH AT BEDTIME, Disp: 30 tablet, Rfl: 5 .  gabapentin (NEURONTIN) 600 MG tablet, TAKE 1/2 TABLET BY MOUTH AT BREAKFAST, 1/2 AT LUNCH, AND 1 AT BEDTIME, Disp: 60 tablet, Rfl: 2 .  HYDROcodone-Acetaminophen 5-300 MG TABS, Take 1 tablet by mouth 2 (two) times daily as needed., Disp: 60 tablet, Rfl: 0 .  levothyroxine (SYNTHROID) 100 MCG tablet, TAKE 1 TABLET(100 MCG) BY MOUTH DAILY, Disp: 90 tablet, Rfl: 3 .  losartan (COZAAR) 50 MG tablet, TAKE 1 TABLET BY MOUTH AT BEDTIME, Disp: 90 tablet, Rfl: 1 .  Magnesium 250 MG TABS, Take 1 tablet by mouth daily., Disp: , Rfl:  .  potassium chloride SA (K-DUR) 20 MEQ tablet, TAKE 1 TABLET BY MOUTH TWICE DAILY, Disp: 180 tablet, Rfl: 1 .  solifenacin (VESICARE) 10 MG tablet, TAKE 1 TABLET BY MOUTH EVERY DAY, Disp: 30 tablet, Rfl: 0 .  triamterene-hydrochlorothiazide (MAXZIDE) 75-50 MG tablet, TAKE 1/2 TABLET BY MOUTH DAILY, Disp: 90 tablet, Rfl: 1 .  verapamil (VERELAN PM) 120 MG 24 hr capsule, TAKE 1 CAPSULE BY MOUTH EVERY DAY. Called [phone] to schedule an appointment for further refills., Disp: 90 capsule, Rfl: 1  No results found.  No images are attached to the encounter.   CMP Latest Ref Rng & Units 06/16/2019  Glucose 70 - 99 mg/dL 146(H)  BUN 8 - 23 mg/dL 20  Creatinine 0.44 - 1.00 mg/dL 1.11(H)  Sodium 135 - 145 mmol/L 140  Potassium 3.5 - 5.1 mmol/L 3.8  Chloride 98 - 111 mmol/L 103  CO2 22 - 32 mmol/L 26  Calcium 8.9 - 10.3 mg/dL 8.9  Total Protein 6.5 - 8.1 g/dL 6.8  Total Bilirubin 0.3 - 1.2 mg/dL 0.4  Alkaline Phos 38 - 126 U/L 92  AST 15 - 41 U/L 26  ALT 0 - 44 U/L 26   CBC Latest Ref Rng & Units 06/16/2019  WBC 4.0 - 10.5 K/uL 15.8(H)  Hemoglobin 12.0 - 15.0 g/dL 13.4  Hematocrit 36.0 - 46.0 % 42.4  Platelets 150 - 400 K/uL 298     Assessment and plan: Patient is a 79 year old female with Rai stage 0 CLL here for routine  follow-up  Patient is white cell count is 15.8 which is a little further elevated as compared to her prior count of 14.3  in August 2020.  Lymphocyte doubling time remains more than 6 months.  H&H and platelet counts are normal.  Patient reports no concerning B symptoms.  She also has chronic relatively mild eosinophilia which has remained essentially stable.  I do not see the need to do a bone marrow biopsy at this time.  She also does not require any treatment for her CLL at this time  Follow-up instructions: I will see her back in 6 months for an in person visit with CBC with differential CMP and LDH  I discussed the assessment and treatment plan with the patient. The patient was provided an opportunity to ask questions and all were answered. The patient agreed with the plan and demonstrated an understanding of the instructions.   The patient was advised to call back or seek an in-person evaluation if the symptoms worsen or if the condition fails to improve as anticipated.    Visit Diagnosis: 1. CLL (chronic lymphocytic leukemia) ([place])     Dr. Randa Evens, MD, MPH [facility] Tel- [phone] 06/19/2019 12:41 PM

## 2019-07-14 ENCOUNTER — Other Ambulatory Visit: Payer: Self-pay | Admitting: Physician Assistant

## 2019-07-14 ENCOUNTER — Other Ambulatory Visit: Payer: Self-pay | Admitting: Neurology

## 2019-07-14 DIAGNOSIS — E7800 Pure hypercholesterolemia, unspecified: Secondary | ICD-10-CM

## 2019-07-14 DIAGNOSIS — I10 Essential (primary) hypertension: Secondary | ICD-10-CM

## 2019-07-14 NOTE — Telephone Encounter (Signed)
Call to patient- scheduled annual appointment- 4/5. Refilled per protocol.

## 2019-07-24 ENCOUNTER — Encounter: Payer: Self-pay | Admitting: Physician Assistant

## 2019-07-24 ENCOUNTER — Other Ambulatory Visit: Payer: Self-pay

## 2019-07-24 ENCOUNTER — Ambulatory Visit (INDEPENDENT_AMBULATORY_CARE_PROVIDER_SITE_OTHER): Payer: Medicare PPO | Admitting: Physician Assistant

## 2019-07-24 VITALS — BP 174/84 | HR 69 | Temp 97.2°F | Resp 16 | Ht 65.5 in | Wt 200.0 lb

## 2019-07-24 DIAGNOSIS — G35 Multiple sclerosis: Secondary | ICD-10-CM

## 2019-07-24 DIAGNOSIS — M8589 Other specified disorders of bone density and structure, multiple sites: Secondary | ICD-10-CM

## 2019-07-24 DIAGNOSIS — R1011 Right upper quadrant pain: Secondary | ICD-10-CM | POA: Diagnosis not present

## 2019-07-24 DIAGNOSIS — I10 Essential (primary) hypertension: Secondary | ICD-10-CM | POA: Diagnosis not present

## 2019-07-24 DIAGNOSIS — E7800 Pure hypercholesterolemia, unspecified: Secondary | ICD-10-CM

## 2019-07-24 DIAGNOSIS — E034 Atrophy of thyroid (acquired): Secondary | ICD-10-CM | POA: Diagnosis not present

## 2019-07-24 DIAGNOSIS — Z1239 Encounter for other screening for malignant neoplasm of breast: Secondary | ICD-10-CM | POA: Diagnosis not present

## 2019-07-24 DIAGNOSIS — R0602 Shortness of breath: Secondary | ICD-10-CM

## 2019-07-24 DIAGNOSIS — R739 Hyperglycemia, unspecified: Secondary | ICD-10-CM

## 2019-07-24 DIAGNOSIS — Z0000 Encounter for general adult medical examination without abnormal findings: Secondary | ICD-10-CM

## 2019-07-24 DIAGNOSIS — E559 Vitamin D deficiency, unspecified: Secondary | ICD-10-CM

## 2019-07-24 DIAGNOSIS — R0609 Other forms of dyspnea: Secondary | ICD-10-CM

## 2019-07-24 DIAGNOSIS — R0600 Dyspnea, unspecified: Secondary | ICD-10-CM

## 2019-07-24 DIAGNOSIS — C9110 Chronic lymphocytic leukemia of B-cell type not having achieved remission: Secondary | ICD-10-CM | POA: Diagnosis not present

## 2019-07-24 NOTE — Patient Instructions (Signed)
Health Maintenance After Age 78 After age 78, you are at a higher risk for certain long-term diseases and infections as well as injuries from falls. Falls are a major cause of broken bones and head injuries in people who are older than age 78. Getting regular preventive care can help to keep you healthy and well. Preventive care includes getting regular testing and making lifestyle changes as recommended by your health care provider. Talk with your health care provider about:  Which screenings and tests you should have. A screening is a test that checks for a disease when you have no symptoms.  A diet and exercise plan that is right for you. What should I know about screenings and tests to prevent falls? Screening and testing are the best ways to find a health problem early. Early diagnosis and treatment give you the best chance of managing medical conditions that are common after age 78. Certain conditions and lifestyle choices may make you more likely to have a fall. Your health care provider may recommend:  Regular vision checks. Poor vision and conditions such as cataracts can make you more likely to have a fall. If you wear glasses, make sure to get your prescription updated if your vision changes.  Medicine review. Work with your health care provider to regularly review all of the medicines you are taking, including over-the-counter medicines. Ask your health care provider about any side effects that may make you more likely to have a fall. Tell your health care provider if any medicines that you take make you feel dizzy or sleepy.  Osteoporosis screening. Osteoporosis is a condition that causes the bones to get weaker. This can make the bones weak and cause them to break more easily.  Blood pressure screening. Blood pressure changes and medicines to control blood pressure can make you feel dizzy.  Strength and balance checks. Your health care provider may recommend certain tests to check your  strength and balance while standing, walking, or changing positions.  Foot health exam. Foot pain and numbness, as well as not wearing proper footwear, can make you more likely to have a fall.  Depression screening. You may be more likely to have a fall if you have a fear of falling, feel emotionally low, or feel unable to do activities that you used to do.  Alcohol use screening. Using too much alcohol can affect your balance and may make you more likely to have a fall. What actions can I take to lower my risk of falls? General instructions  Talk with your health care provider about your risks for falling. Tell your health care provider if: ? You fall. Be sure to tell your health care provider about all falls, even ones that seem minor. ? You feel dizzy, sleepy, or off-balance.  Take over-the-counter and prescription medicines only as told by your health care provider. These include any supplements.  Eat a healthy diet and maintain a healthy weight. A healthy diet includes low-fat dairy products, low-fat (lean) meats, and fiber from whole grains, beans, and lots of fruits and vegetables. Home safety  Remove any tripping hazards, such as rugs, cords, and clutter.  Install safety equipment such as grab bars in bathrooms and safety rails on stairs.  Keep rooms and walkways well-lit. Activity   Follow a regular exercise program to stay fit. This will help you maintain your balance. Ask your health care provider what types of exercise are appropriate for you.  If you need a cane or   walker, use it as recommended by your health care provider.  Wear supportive shoes that have nonskid soles. Lifestyle  Do not drink alcohol if your health care provider tells you not to drink.  If you drink alcohol, limit how much you have: ? 0-1 drink a day for women. ? 0-2 drinks a day for men.  Be aware of how much alcohol is in your drink. In the [place] one drink equals one typical bottle of beer (12  oz), one-half glass of wine (5 oz), or one shot of hard liquor (1 oz).  Do not use any products that contain nicotine or tobacco, such as cigarettes and e-cigarettes. If you need help quitting, ask your health care provider. Summary  Having a healthy lifestyle and getting preventive care can help to protect your health and wellness after age 78.  Screening and testing are the best way to find a health problem early and help you avoid having a fall. Early diagnosis and treatment give you the best chance for managing medical conditions that are more common for people who are older than age 78.  Falls are a major cause of broken bones and head injuries in people who are older than age 78. Take precautions to prevent a fall at home.  Work with your health care provider to learn what changes you can make to improve your health and wellness and to prevent falls. This information is not intended to replace advice given to you by your health care provider. Make sure you discuss any questions you have with your health care provider. Document Revised: 07/28/2018 Document Reviewed: 02/17/2017 Elsevier Patient Education  2020 Elsevier Inc.  

## 2019-07-24 NOTE — Progress Notes (Signed)
Annual Wellness Visit    [name], Female    DOB: [birth year], 79 y.o.   MRN: [id number] Visit Date: 07/24/2019  Today's Provider: Mar Daring, PA-C  Subjective:    Chief Complaint  Patient presents with  . Medicare [place] Mcbane is a 79 y.o. female who presents today for her Annual Wellness Visit. Reports that she is doing well. Sleeping well.  HPI She does mention an acute change with worsening DOE. Reports this started about a month or two ago. Started noticing even short walks, like to her mailbox would cause SOB. Rest makes better. Denies any chest pain or chest tightness, dizziness, lightheadedness, lower extremity swelling. Has seen a cardiologist in the past, in [place], but was a few years ago.   She is seen by a Neurologist and orthopedic MD in [place] as well for other issues. Reports these are stable.   Patient Active Problem List   Diagnosis Date Noted  . Chronic cholecystitis   . Abnormal computed tomography of large intestine   . Benign neoplasm of ascending colon   . Neoplasm of digestive system   . Presence of other specified functional implants   . Encounter for fitting and adjustment of other gastrointestinal appliance and device   . Calculus of common duct   . Abnormal findings on diagnostic imaging of gall bladder   . FHx: dementia 07/06/2017  . CLL (chronic lymphocytic leukemia) ([place]) 11/23/2016  . Cellulitis 08/22/2015  . 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 Overactive bladder 05/30/2015  . Mild cognitive impairment 02/07/2015  . Bloodgood disease 12/25/2014  . Colon polyp 12/25/2014  . 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 Cramps of lower extremity 12/25/2014  . Abnormal cerebrospinal fluid 12/25/2014  . Narrowing of intervertebral disc space 12/25/2014  . Contracture of palmar fascia (Dupuytren's) 12/25/2014  . Breath shortness 12/25/2014  . Essential (primary) hypertension 12/25/2014  .  Benign essential tremor 12/25/2014  . Abnormal gait 12/25/2014  . Hypercholesteremia 12/25/2014  . Blood glucose elevated 12/25/2014  . Decreased potassium in the blood 12/25/2014  . Hypothyroidism 12/25/2014  . Insomnia 12/25/2014  . Headache, migraine, intractable 12/25/2014  . 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[place]) 12/25/2014  . Fibrositis 12/25/2014  . Discoloration of nail 12/25/2014  . 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 Arthritis sicca 12/25/2014  . Burning or prickling sensation 12/25/2014  . Post menopausal syndrome 12/25/2014  . Avitaminosis D 12/25/2014  . Migraine 12/14/2012   Past Medical History:  Diagnosis Date  . Allergy    some  . Cataract    bilaterally both eyes   . 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 Gallstones   . GERD (gastroesophageal reflux disease)   . Hydrocephalus ([place])    [facility]   . Hyperglycemia   . Hyperlipidemia   . Hypertension   . Lymphocytosis    monoclonal b cell  . Migraine, unspecified, without mention of intractable migraine without mention of status migrainosus 12/14/2012  . Osteoarthritis, chronic   . Osteoporosis   . Peptic ulcer    some bleeding with ulcers as well    Past Surgical History:  Procedure Laterality Date  . ABDOMINAL HYSTERECTOMY    . BREAST CYST ASPIRATION Left 1993   Removed  . breast cyst removed Right    benign   . BREAST LUMPECTOMY Bilateral   . COLONOSCOPY    . ERCP N/A 02/04/2018   Procedure: ENDOSCOPIC RETROGRADE CHOLANGIOPANCREATOGRAPHY (ERCP);  Surgeon: Lucilla Lame, MD;  Location: [facility] ENDOSCOPY;  Service: Endoscopy;  Laterality: N/A;  . ERCP N/A 02/28/2018   Procedure: ENDOSCOPIC RETROGRADE CHOLANGIOPANCREATOGRAPHY (ERCP) STENT REMOVAL;  Surgeon: Lucilla Lame, MD;  Location: [facility] ENDOSCOPY;  Service: Endoscopy;  Laterality: N/A;  . EYE SURGERY    . growth removal Left 2019   L wrist growth removed, abnormal cells  . History of Cataract surgery Right 2011   left 2011    . PARTIAL HYSTERECTOMY    . POLYPECTOMY    . ROBOTIC ASSISTED LAPAROSCOPIC CHOLECYSTECTOMY-MULTI SITE N/A 03/08/2018   Procedure: ROBOTIC ASSISTED LAPAROSCOPIC CHOLECYSTECTOMY-MULTI SITE;  Surgeon: Jules Husbands, MD;  Location: [facility] ORS;  Service: General;  Laterality: N/A;  . S/P ACL repair Left    knee  . TONSILLECTOMY    . TUBAL LIGATION  1975   Bilateral   Social History   Tobacco Use  . Smoking status: Never Smoker  . Smokeless tobacco: Never Used  Substance Use Topics  . Alcohol use: Yes    Alcohol/week: 1.0 standard drinks    Types: 1 Glasses of wine per week    Comment: Rare- wine on occasion  . Drug use: No   Allergies  Allergen Reactions  . Shellfish Allergy     Throat closes, rash  . Latex Rash    Tapes,adhesives       Medications: Outpatient Medications Prior to Visit  Medication Sig  . zolpidem (AMBIEN) 10 MG tablet TAKE 1 TABLET BY MOUTH EVERY DAY AT BEDTIME  . ALPRAZolam (XANAX) 0.25 MG tablet TAKE 2 TABLETS(0.5 MG) BY MOUTH AT BEDTIME  . aspirin 81 MG tablet Take 81 mg by mouth daily.  [name] atenolol (TENORMIN) 50 MG tablet TAKE 1 TABLET BY MOUTH EVERY DAY  . Cholecalciferol (VITAMIN D3) 1.25 MG (50000 UT) CAPS TAKE 1 CAPSULE BY MOUTH EVERY MONTH AS DIRECTED  . cyclobenzaprine (FLEXERIL) 10 MG tablet TAKE 1 TABLET(10 MG) BY MOUTH AT BEDTIME  . donepezil (ARICEPT) 10 MG tablet Take 1 tablet (10 mg total) by mouth at bedtime. Appointment needed around May 2021 for further refills.  . ezetimibe (ZETIA) 10 MG tablet TAKE 1 TABLET BY MOUTH AT BEDTIME  . gabapentin (NEURONTIN) 600 MG tablet TAKE 1/2 TABLET BY MOUTH AT BREAKFAST, 1/2 AT LUNCH, AND 1 AT BEDTIME  . HYDROcodone-Acetaminophen 5-300 MG TABS Take 1 tablet by mouth 2 (two) times daily as needed.  [name] levothyroxine (SYNTHROID) 100 MCG tablet TAKE 1 TABLET(100 MCG) BY MOUTH DAILY  . losartan (COZAAR) 50 MG tablet TAKE 1 TABLET BY MOUTH AT BEDTIME  . Magnesium 250 MG TABS Take 1 tablet by mouth daily.  .  potassium chloride SA (K-DUR) 20 MEQ tablet TAKE 1 TABLET BY MOUTH TWICE DAILY  . solifenacin (VESICARE) 10 MG tablet TAKE 1 TABLET BY MOUTH EVERY DAY  . triamterene-hydrochlorothiazide (MAXZIDE) 75-50 MG tablet TAKE 1/2 TABLET BY MOUTH DAILY  . verapamil (VERELAN PM) 120 MG 24 hr capsule TAKE 1 CAPSULE BY MOUTH EVERY DAY. Called [phone] to schedule an appointment for further refills.   No facility-administered medications prior to visit.    Allergies  Allergen Reactions  . Shellfish Allergy     Throat closes, rash  . Latex Rash    Tapes,adhesives    Patient Care Team: Mar Daring, PA-C as PCP - General (Family Medicine) Monna Fam, MD as Consulting Physician (Ophthalmology) [name], MD as Consulting Physician (Neurology)  Review of Systems  Constitutional: Negative.   HENT: Positive for tinnitus.  Eyes: Negative.   Respiratory: Positive for shortness of breath ("some" for over a year).   Cardiovascular: Negative.  Negative for chest pain, palpitations and leg swelling.  Gastrointestinal: Positive for diarrhea ("since gallbladder surg").  Endocrine: Negative.   Genitourinary: Negative.   Musculoskeletal: Negative.   Skin: Negative.   Allergic/Immunologic: Negative.   Neurological: Positive for tremors ("some when hungry") and headaches.  Hematological: Negative.   Psychiatric/Behavioral: Negative.     Last CBC Lab Results  Component Value Date   WBC 15.8 (H) 06/16/2019   HGB 13.4 06/16/2019   HCT 42.4 06/16/2019   MCV 93.0 06/16/2019   MCH 29.4 06/16/2019   RDW 14.1 06/16/2019   PLT 298 0000000   Last metabolic panel Lab Results  Component Value Date   GLUCOSE 146 (H) 06/16/2019   NA 140 06/16/2019   K 3.8 06/16/2019   CL 103 06/16/2019   CO2 26 06/16/2019   BUN 20 06/16/2019   CREATININE 1.11 (H) 06/16/2019   GFRNONAA 48 (L) 06/16/2019   GFRAA 55 (L) 06/16/2019   CALCIUM 8.9 06/16/2019   PROT 6.8 06/16/2019   ALBUMIN 3.6  06/16/2019   LABGLOB 2.6 02/02/2018   AGRATIO 1.7 02/02/2018   BILITOT 0.4 06/16/2019   ALKPHOS 92 06/16/2019   AST 26 06/16/2019   ALT 26 06/16/2019   ANIONGAP 11 06/16/2019       Objective:    Vitals: BP (!) 174/84 (BP Location: Left Arm, Patient Position: Sitting, Cuff Size: Large)   Pulse 69   Temp (!) 97.2 F (36.2 C) (Temporal)   Resp 16   Ht 5' 5.5" (1.664 m)   Wt 200 lb (90.7 kg)   BMI 32.78 kg/m  BP Readings from Last 3 Encounters:  07/24/19 (!) 174/84  12/16/18 120/72  06/14/18 (!) 184/79      Physical Exam Vitals reviewed.  Constitutional:      General: She is not in acute distress.    Appearance: Normal appearance. She is well-developed. She is obese. She is not ill-appearing or diaphoretic.  HENT:     Head: Normocephalic and atraumatic.     Right Ear: Tympanic membrane, ear canal and external ear normal.     Left Ear: Tympanic membrane, ear canal and external ear normal.     Nose: Nose normal.     Mouth/Throat:     Mouth: Mucous membranes are moist.     Pharynx: No oropharyngeal exudate or posterior oropharyngeal erythema.  Eyes:     General: No scleral icterus.       Right eye: No discharge.        Left eye: No discharge.     Extraocular Movements: Extraocular movements intact.     Conjunctiva/sclera: Conjunctivae normal.     Pupils: Pupils are equal, round, and reactive to light.  Neck:     Thyroid: No thyromegaly.     Vascular: No carotid bruit or JVD.     Trachea: No tracheal deviation.  Cardiovascular:     Rate and Rhythm: Normal rate and regular rhythm.     Pulses: Normal pulses.     Heart sounds: Normal heart sounds. No murmur. No friction rub. No gallop.   Pulmonary:     Effort: Pulmonary effort is normal. No respiratory distress.     Breath sounds: Normal breath sounds. No wheezing or rales.  Chest:     Chest wall: No tenderness.  Abdominal:     General: Abdomen is flat. Bowel sounds are normal. There is  no distension.      Palpations: Abdomen is soft. There is no mass.     Tenderness: There is abdominal tenderness (RUQ still). There is no guarding or rebound.  Musculoskeletal:        General: No tenderness. Normal range of motion.     Cervical back: Normal range of motion and neck supple.     Right lower leg: No edema.     Left lower leg: No edema.  Lymphadenopathy:     Cervical: No cervical adenopathy.  Skin:    General: Skin is warm and dry.     Capillary Refill: Capillary refill takes less than 2 seconds.     Findings: No rash.  Neurological:     General: No focal deficit present.     Mental Status: She is alert and oriented to person, place, and time. Mental status is at baseline.  Psychiatric:        Mood and Affect: Mood normal.        Behavior: Behavior normal.        Thought Content: Thought content normal.        Judgment: Judgment normal.     Most recent functional status assessment: In your present state of health, do you have any difficulty performing the following activities: 07/24/2019  Hearing? N  Vision? N  Difficulty concentrating or making decisions? N  Walking or climbing stairs? Y  Comment "only if she gets out of breath"  Dressing or bathing? N  Doing errands, shopping? N  Some recent data might be hidden    Most recent fall risk assessment: Fall Risk  07/24/2019  Falls in the past year? 0  Number falls in past yr: 0  Injury with Fall? 0  Follow up Falls evaluation completed     Most recent depression screenings: PHQ 2/9 Scores 07/24/2019 09/23/2017  PHQ - 2 Score 0 1  PHQ- 9 Score - 7    Most recent cognitive screening: 6CIT Screen 07/02/2016  What Year? 0 points  What month? 0 points  What time? 0 points  Count back from 20 0 points  Months in reverse 0 points  Repeat phrase 0 points  Total Score 0    No results found for any visits on 07/24/19.    Assessment & Plan:      Annual wellness visit done today including the all of the following: Reviewed  patient's Family Medical History Reviewed and updated list of patient's medical providers Assessment of cognitive impairment was done Assessed patient's functional ability Established a written schedule for health screening [place] Completed and Reviewed  Exercise Activities and Dietary recommendations Goals    . Increase water intake     Recommend increasing water intake to 3 glasses a day.       Immunization History  Administered Date(s) Administered  . Influenza Split 01/09/2010, 12/24/2011  . Influenza, High Dose Seasonal PF 01/07/2014, 01/03/2018  . Influenza-Unspecified 02/01/2017  . Pneumococcal Conjugate-13 06/25/2014  . Pneumococcal Polysaccharide-23 05/04/2011  . Td 04/18/2004  . Tdap 11/09/2012    Health Maintenance  Topic Date Due  . MAMMOGRAM  04/08/2019  . INFLUENZA VACCINE  11/19/2019  . TETANUS/TDAP  11/10/2022  . DEXA SCAN  Completed  . PNA vac Low Risk Adult  Completed     Discussed health benefits of physical activity, and encouraged her to engage in regular exercise appropriate for her age and condition.    1. Annual physical exam Normal exam. Up to  date on screenings and vaccinations.   2. Encounter for breast cancer screening using non-mammogram modality Due. Has done at [facility]. Bone density was also ordered and faxed to [facility] since this is due as well and she can have scheduled together.   3. SOB (shortness of breath) New with activity. EKG today essentially unremarkable with NSR rate of 69. Old anterior infarct noted but she does mention at her last cardiac work up (she thinks was 37) they had noted an old area of hypomobility in this area and mentioned to her, but she has had no symptomatic, documented MI. Will refer to cardiology as below for further evaluation since new and slowly progressive DOE.  - EKG 12-Lead - Ambulatory referral to Cardiology  4. Essential (primary) hypertension Elevated today. Reports home readings  are normal. Continue Atenolol 50mg  daily, losartan 50mg  daily, and maxzide 75-50mg  daily. Will check labs as below and f/u pending results. - Lipid Panel With LDL/HDL Ratio - HgB A1c  5. Hypothyroidism due to acquired atrophy of thyroid Stable. Continue levothyroxine 144mcg. Will check labs as below and f/u pending results. - TSH + free T4  6. Hypercholesteremia Stable. Continue Zetia 10mg . Will check labs as below and f/u pending results. - Lipid Panel With LDL/HDL Ratio - HgB A1c  7. Blood glucose elevated Diet controlled. Will check labs as below and f/u pending results. - Lipid Panel With LDL/HDL Ratio - HgB A1c  8. Avitaminosis D H/O this and postmenopausal. Will check labs as below and f/u pending results. - Vitamin D (25 hydroxy)  9. CLL (chronic lymphocytic leukemia) (HCC) Stable. Followed by Oncology every 6 months.   10. 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[place]) Stable Followed by Neurology.   11. RUQ pain Chronic. Will get imaging of thoracic spine to r/o source from there per request of Neurology.  - DG Thoracic Spine W/Swimmers; Future  12. Osteopenia of multiple sites See above medical treatment plan for #2.  - DG Bone Density; Future  13. DOE (dyspnea on exertion) See above medical treatment plan for #3.  - Ambulatory referral to Cardiology      Rubye [place]  [facility] [phone] (phone) [phone] (fax)  [place]

## 2019-07-27 ENCOUNTER — Telehealth: Payer: Self-pay

## 2019-07-27 DIAGNOSIS — E559 Vitamin D deficiency, unspecified: Secondary | ICD-10-CM

## 2019-07-27 LAB — HEMOGLOBIN A1C
Est. average glucose Bld gHb Est-mCnc: 131 mg/dL
Hgb A1c MFr Bld: 6.2 % — ABNORMAL HIGH (ref 4.8–5.6)

## 2019-07-27 LAB — TSH+FREE T4
Free T4: 1.14 ng/dL (ref 0.82–1.77)
TSH: 11.6 u[IU]/mL — ABNORMAL HIGH (ref 0.450–4.500)

## 2019-07-27 LAB — LIPID PANEL WITH LDL/HDL RATIO
Cholesterol, Total: 181 mg/dL (ref 100–199)
HDL: 44 mg/dL (ref >39)
LDL Chol Calc (NIH): 111 mg/dL — ABNORMAL HIGH (ref 0–99)
LDL/HDL Ratio: 2.5 ratio (ref 0.0–3.2)
Triglycerides: 148 mg/dL (ref 0–149)
VLDL Cholesterol Cal: 26 mg/dL (ref 5–40)

## 2019-07-27 LAB — VITAMIN D 25 HYDROXY (VIT D DEFICIENCY, FRACTURES): Vit D, 25-Hydroxy: 26.8 ng/mL — ABNORMAL LOW (ref 30.0–100.0)

## 2019-07-27 MED ORDER — VITAMIN D3 1.25 MG (50000 UT) PO CAPS: 1.2500 mg | ORAL_CAPSULE | ORAL | 1 refills | Status: DC

## 2019-07-27 NOTE — Telephone Encounter (Signed)
-----   Message from Mar Daring, PA-C sent at 07/27/2019  9:16 AM EDT ----- Thyroid is showing to be undercorrected. If you are taking levothyroxine daily, would recommend to increase dose to 116mcg. Cholesterol is normal. A1c did increase just slightly to 6.2 from 6.1 last year. Vit D is borderline low. Are you taking any OTC Vit D supplements? If so what dose?

## 2019-07-27 NOTE — Telephone Encounter (Signed)
Patient advised as directed below. Per patient she is taking Vitamin D 1.25mg  (50000UT) once a month. Per provider patient can do 1000 daily or 50000 once a week. Patient is going to take the 50000 once a week and is going to need a need prescription. Prescription sent in.  Thanks,  -Kealie Barrie

## 2019-07-28 ENCOUNTER — Other Ambulatory Visit: Payer: Self-pay

## 2019-07-28 ENCOUNTER — Ambulatory Visit
Admission: RE | Admit: 2019-07-28 | Discharge: 2019-07-28 | Disposition: A | Discharge status: Home / Self Care | Payer: Medicare PPO | Source: Ambulatory Visit | Attending: Physician Assistant | Admitting: Physician Assistant

## 2019-07-28 DIAGNOSIS — R1011 Right upper quadrant pain: Secondary | ICD-10-CM

## 2019-07-31 ENCOUNTER — Telehealth: Payer: Self-pay

## 2019-07-31 NOTE — Telephone Encounter (Signed)
-----   Message from Mar Daring, [place] sent at 07/30/2019  1:47 PM EDT ----- There is multi-level degenerative changes throughout the thoracic spine but no abnormal alignment or compression fractures noted.

## 2019-07-31 NOTE — Telephone Encounter (Signed)
Patient advised as directed below. 

## 2019-08-01 ENCOUNTER — Other Ambulatory Visit: Payer: Self-pay

## 2019-08-01 ENCOUNTER — Ambulatory Visit (INDEPENDENT_AMBULATORY_CARE_PROVIDER_SITE_OTHER): Payer: Medicare PPO | Admitting: Cardiovascular Disease

## 2019-08-01 ENCOUNTER — Encounter: Payer: Self-pay | Admitting: Cardiovascular Disease

## 2019-08-01 VITALS — BP 148/86 | HR 65 | Ht 65.0 in | Wt 203.4 lb

## 2019-08-01 DIAGNOSIS — R0600 Dyspnea, unspecified: Secondary | ICD-10-CM | POA: Diagnosis not present

## 2019-08-01 DIAGNOSIS — I10 Essential (primary) hypertension: Secondary | ICD-10-CM | POA: Diagnosis not present

## 2019-08-01 DIAGNOSIS — E785 Hyperlipidemia, unspecified: Secondary | ICD-10-CM | POA: Diagnosis not present

## 2019-08-01 DIAGNOSIS — R0609 Other forms of dyspnea: Secondary | ICD-10-CM

## 2019-08-01 MED ORDER — CARVEDILOL 6.25 MG PO TABS: 6.2500 mg | ORAL_TABLET | Freq: Two times a day (BID) | ORAL | 5 refills | Status: DC

## 2019-08-01 NOTE — Progress Notes (Signed)
Cardiology Office Note   Date:  08/01/2019   ID:  Cathy Barnes, DOB 07/22/1940, MRN [id number]  PCP:  Mar Daring, PA-C  Cardiologist:   Kathlyn Sacramento, MD   Chief Complaint  Patient presents with  . New Patient (Initial Visit)    Ref by Fenton Malling, PA for shortness of breath on exertion. Meds reviewed by the pt. verbally. Pt. c/o shortness of breath with a squeezing in the right side. Pt. recently diagnosed with a mass on her spine.       History of Present Illness: Cathy Barnes is a 79 y.o. female who was referred by Fenton Malling for evaluation of exertional dyspnea. 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 She reports being seen by her cardiologist more than 15 years ago for some chest pain.  She remembers having an echocardiogram and a stress test and was told of no significant abnormalities.  These records are not available and she does not remember the name of the cardiologist. Over the last month, she has experienced significant exertional dyspnea with she is happening now with minimal activities with no orthopnea, PND or leg edema.  She has occasional squeezing in the right side of the chest with exertion.  The symptoms are relatively new to her.  She gained about 6 pounds over the last year.  Recent testing showed elevated TSH and the dose of thyroid replacement therapy was increased. She is a lifelong non-smoker and has no family history of premature coronary artery disease.  She is not known to have any lung disease.  Past Medical History:  Diagnosis Date  . Allergy    some  . Cataract    bilaterally both eyes   . 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 Gallstones   . GERD (gastroesophageal reflux disease)   . Hydrocephalus ([employer])    [facility]   . Hyperglycemia   .  Hyperlipidemia   . Hypertension   . Lymphocytosis    monoclonal b cell  . Migraine, unspecified, without mention of intractable migraine without mention of status migrainosus 12/14/2012  . Osteoarthritis, chronic   . Osteoporosis   . Peptic ulcer    some bleeding with ulcers as well     Past Surgical History:  Procedure Laterality Date  . ABDOMINAL HYSTERECTOMY    . BREAST CYST ASPIRATION Left 1993   Removed  . breast cyst removed Right    benign   . BREAST LUMPECTOMY Bilateral   . COLONOSCOPY    . ERCP N/A 02/04/2018   Procedure: ENDOSCOPIC RETROGRADE CHOLANGIOPANCREATOGRAPHY (ERCP);  Surgeon: Lucilla Lame, MD;  Location: [facility] ENDOSCOPY;  Service: Endoscopy;  Laterality: N/A;  . ERCP N/A 02/28/2018   Procedure: ENDOSCOPIC RETROGRADE CHOLANGIOPANCREATOGRAPHY (ERCP) STENT REMOVAL;  Surgeon: Lucilla Lame, MD;  Location: [facility] ENDOSCOPY;  Service: Endoscopy;  Laterality: N/A;  . EYE SURGERY    . growth removal Left 2019   L wrist growth removed, abnormal cells  . History of Cataract surgery Right 2011   left 2011  . PARTIAL HYSTERECTOMY    . POLYPECTOMY    . ROBOTIC ASSISTED LAPAROSCOPIC CHOLECYSTECTOMY-MULTI SITE N/A 03/08/2018   Procedure: ROBOTIC ASSISTED LAPAROSCOPIC CHOLECYSTECTOMY-MULTI SITE;  Surgeon: Jules Husbands, MD;  Location: [facility] ORS;  Service: General;  Laterality: N/A;  . S/P ACL repair Left    knee  . TONSILLECTOMY    .  TUBAL LIGATION  1975   Bilateral     Current Outpatient Medications  Medication Sig Dispense Refill  . ALPRAZolam (XANAX) 0.25 MG tablet TAKE 2 TABLETS(0.5 MG) BY MOUTH AT BEDTIME 180 tablet 1  . aspirin 81 MG tablet Take 81 mg by mouth daily.    . cyclobenzaprine (FLEXERIL) 10 MG tablet TAKE 1 TABLET(10 MG) BY MOUTH AT BEDTIME 30 tablet 6  . donepezil (ARICEPT) 10 MG tablet Take 1 tablet (10 mg total) by mouth at bedtime. Appointment needed around May 2021 for further refills. 90 tablet 0  . ezetimibe (ZETIA) 10 MG tablet TAKE 1 TABLET BY  MOUTH AT BEDTIME 30 tablet 0  . gabapentin (NEURONTIN) 600 MG tablet TAKE 1/2 TABLET BY MOUTH AT BREAKFAST, 1/2 AT LUNCH, AND 1 AT BEDTIME 60 tablet 2  . HYDROcodone-Acetaminophen 5-300 MG TABS Take 1 tablet by mouth 2 (two) times daily as needed. 60 tablet 0  . levothyroxine (SYNTHROID) 112 MCG tablet Take 112 mcg by mouth daily before breakfast.    . losartan (COZAAR) 50 MG tablet TAKE 1 TABLET BY MOUTH AT BEDTIME 90 tablet 1  . Magnesium 250 MG TABS Take 1 tablet by mouth daily.    . potassium chloride SA (K-DUR) 20 MEQ tablet TAKE 1 TABLET BY MOUTH TWICE DAILY 180 tablet 1  . solifenacin (VESICARE) 10 MG tablet TAKE 1 TABLET BY MOUTH EVERY DAY 30 tablet 0  . triamterene-hydrochlorothiazide (MAXZIDE) 75-50 MG tablet TAKE 1/2 TABLET BY MOUTH DAILY 90 tablet 1  . verapamil (VERELAN PM) 120 MG 24 hr capsule TAKE 1 CAPSULE BY MOUTH EVERY DAY. Called [phone] to schedule an appointment for further refills. 90 capsule 1  . zolpidem (AMBIEN) 10 MG tablet TAKE 1 TABLET BY MOUTH EVERY DAY AT BEDTIME 90 tablet 1   No current facility-administered medications for this visit.    Allergies:   Shellfish allergy and Latex    Social History:  The patient  reports that she has never smoked. She has never used smokeless tobacco. She reports current alcohol use of about 1.0 standard drinks of alcohol per week. She reports that she does not use drugs.   Family History:  The patient's family history includes Colon cancer in her father and mother; Heart disease in her father; Hypertension in her father and mother.    ROS:  Please see the history of present illness.   Otherwise, review of systems are positive for none.   All other systems are reviewed and negative.    PHYSICAL EXAM: VS:  BP (!) 148/86 (BP Location: Right Arm, Patient Position: Sitting, Cuff Size: Large)   Pulse 65   Ht 5\' 5"  (1.651 m)   Wt 203 lb 6 oz (92.3 kg)   SpO2 98%   BMI 33.84 kg/m  , BMI Body mass index is 33.84 kg/m. GEN:  Well nourished, well developed, in no acute distress  HEENT: normal  Neck: no JVD, carotid bruits, or masses Cardiac: RRR; no murmurs, rubs, or gallops,no edema  Respiratory:  clear to auscultation bilaterally, normal work of breathing GI: soft, nontender, nondistended, + BS MS: no deformity or atrophy  Skin: warm and dry, no rash Neuro:  Strength and sensation are intact Psych: euthymic mood, full affect   EKG:  EKG is ordered today. The ekg ordered today demonstrates normal sinus rhythm with no significant ST or T wave changes.   Recent Labs: 06/16/2019: ALT 26; BUN 20; Creatinine, Ser 1.11; Hemoglobin 13.4; Platelets 298; Potassium 3.8; Sodium 140  07/26/2019: TSH 11.600    Lipid Panel    Component Value Date/Time   CHOL 181 07/26/2019 1015   TRIG 148 07/26/2019 1015   HDL 44 07/26/2019 1015   CHOLHDL 3.9 09/23/2017 1014   LDLCALC 111 (H) 07/26/2019 1015      Wt Readings from Last 3 Encounters:  08/01/19 203 lb 6 oz (92.3 kg)  07/24/19 200 lb (90.7 kg)  12/16/18 196 lb (88.9 kg)       PAD Screen 08/01/2019  Previous PAD dx? No  Previous surgical procedure? No  Pain with walking? Yes  Subsides with rest? Yes  Feet/toe relief with dangling? No  Painful, non-healing ulcers? No  Extremities discolored? No      ASSESSMENT AND PLAN:  1.  Exertional dyspnea with occasional chest discomfort: I agree that we have to evaluate for cardiac etiology especially that the patient has no other explanation for her symptoms.  She has no history of lung disease and has no history of tobacco use.  She has multiple risk factors for coronary artery disease.  I requested a Lexiscan Myoview to rule out ischemia.  She is not able to exercise on a treadmill.  In addition, I requested an echocardiogram to evaluate LV systolic/diastolic function and pulmonary pressure.  She does have prolonged history of essential hypertension which has been mostly uncontrolled and she is at high risk for  diastolic dysfunction.  2.  Essential hypertension: Blood pressure is not controlled.  I do not think the combination of atenolol and verapamil is optimal.  I elected to switch atenolol to carvedilol 6.25 mg twice daily.  We could consider switching verapamil to amlodipine if needed.  She is also on losartan 50 mg daily which can be increased.  3.  Hyperlipidemia: Currently on Zetia 10 mg daily.  Recent lipid profile showed an LDL of 101.  If we confirm the presence of coronary artery disease, she will require treatment with a statin.    Disposition:   FU with me in 1 month  Signed,  Kathlyn Sacramento, MD  08/01/2019 3:23 PM    [facility]

## 2019-08-01 NOTE — Patient Instructions (Signed)
Medication Instructions:  Your physician has recommended you make the following change in your medication:   1) STOP Atenolol  2) START Carvedilol 6.25 mg twice daily. An Rx has been sent to your pharmacy.  *If you need a refill on your cardiac medications before your next appointment, please call your pharmacy*   Lab Work: None ordered If you have labs (blood work) drawn today and your tests are completely normal, you will receive your results only by: [name] MyChart Message (if you have MyChart) OR . A paper copy in the mail If you have any lab test that is abnormal or we need to change your treatment, we will call you to review the results.   Testing/Procedures: Your physician has requested that you have an echocardiogram. Echocardiography is a painless test that uses sound waves to create images of your heart. It provides your doctor with information about the size and shape of your heart and how well your heart's chambers and valves are working. This procedure takes approximately one hour. There are no restrictions for this procedure.  Your physician has requested that you have a lexiscan myoview. For further information please visit [email or web address]. Please follow instruction sheet, as given.     Follow-Up: At [facility], you and your health needs are our priority.  As part of our continuing mission to provide you with exceptional heart care, we have created designated Provider Care Teams.  These Care Teams include your primary Cardiologist (physician) and Advanced Practice Providers (APPs -  Physician Assistants and Nurse Practitioners) who all work together to provide you with the care you need, when you need it.  We recommend signing up for the patient portal called "MyChart".  Sign up information is provided on this After Visit Summary.  MyChart is used to connect with patients for Virtual Visits (Telemedicine).  Patients are able to view lab/test results, encounter notes,  upcoming appointments, etc.  Non-urgent messages can be sent to your provider as well.   To learn more about what you can do with MyChart, go to [email or web address].    Your next appointment:   4 week(s)  The format for your next appointment:   In Person  Provider:    You may see Dr. Fletcher Anon or one of the following Advanced Practice Providers on your designated Care Team:    Murray Hodgkins, NP  Christell Faith, PA-C  Marrianne Mood, PA-C    Other Instructions [place]  Your caregiver has ordered a Stress Test with nuclear imaging. The purpose of this test is to evaluate the blood supply to your heart muscle. This procedure is referred to as a "Non-Invasive Stress Test." This is because other than having an IV started in your vein, nothing is inserted or "invades" your body. Cardiac stress tests are done to find areas of poor blood flow to the heart by determining the extent of coronary artery disease (CAD). Some patients exercise on a treadmill, which naturally increases the blood flow to your heart, while others who are  unable to walk on a treadmill due to physical limitations have a pharmacologic/chemical stress agent called Lexiscan . This medicine will mimic walking on a treadmill by temporarily increasing your coronary blood flow.   Please note: these test may take anywhere between 2-4 hours to complete  PLEASE REPORT TO [facility] AT THE FIRST DESK WILL DIRECT YOU WHERE TO GO  Date of Procedure:_____________________________________  Arrival Time for Procedure:______________________________  Instructions regarding medication:  __X__:  Hold betablocker (Carvedilol) night before procedure and morning of procedure   PLEASE NOTIFY THE OFFICE AT LEAST 24 HOURS IN ADVANCE IF YOU ARE UNABLE TO KEEP YOUR APPOINTMENT.  [phone] AND  PLEASE NOTIFY NUCLEAR MEDICINE AT [facility] AT LEAST 24 HOURS IN ADVANCE IF YOU ARE UNABLE TO KEEP YOUR  APPOINTMENT. [phone]  How to prepare for your Myoview test:  1. Do not eat or drink after midnight 2. No caffeine for 24 hours prior to test 3. No smoking 24 hours prior to test. 4. Your medication may be taken with water.  If your doctor stopped a medication because of this test, do not take that medication. 5. Ladies, please do not wear dresses.  Skirts or pants are appropriate. Please wear a short sleeve shirt. 6. No perfume, cologne or lotion. 7. Wear comfortable walking shoes. No heels!

## 2019-08-08 ENCOUNTER — Telehealth: Payer: Self-pay | Admitting: Cardiovascular Disease

## 2019-08-08 NOTE — Telephone Encounter (Signed)
Hx of cardiac testing with [facility] in [employer] records via fax.

## 2019-08-09 ENCOUNTER — Other Ambulatory Visit: Payer: Self-pay

## 2019-08-09 ENCOUNTER — Encounter
Admission: RE | Admit: 2019-08-09 | Discharge: 2019-08-09 | Disposition: A | Discharge status: Home / Self Care | Payer: Medicare PPO | Source: Ambulatory Visit | Attending: Cardiovascular Disease | Admitting: Cardiovascular Disease

## 2019-08-09 DIAGNOSIS — R0600 Dyspnea, unspecified: Secondary | ICD-10-CM | POA: Diagnosis not present

## 2019-08-09 DIAGNOSIS — R0609 Other forms of dyspnea: Secondary | ICD-10-CM

## 2019-08-09 MED ORDER — REGADENOSON 0.4 MG/5ML IV SOLN
0.4000 mg | Freq: Once | INTRAVENOUS | Status: AC
  Administered 2019-08-09: 0.4 mg via INTRAVENOUS
  Filled 2019-08-09: qty 5

## 2019-08-09 MED ORDER — TECHNETIUM TC 99M TETROFOSMIN IV KIT
10.0000 | PACK | Freq: Once | INTRAVENOUS | Status: AC | PRN
  Administered 2019-08-09: 9.948 via INTRAVENOUS

## 2019-08-09 MED ORDER — TECHNETIUM TC 99M TETROFOSMIN IV KIT
30.0000 | PACK | Freq: Once | INTRAVENOUS | Status: AC | PRN
  Administered 2019-08-09: 32.115 via INTRAVENOUS

## 2019-08-10 ENCOUNTER — Other Ambulatory Visit: Payer: Self-pay | Admitting: Physician Assistant

## 2019-08-10 DIAGNOSIS — G4700 Insomnia, unspecified: Secondary | ICD-10-CM

## 2019-08-10 LAB — NM MYOCAR MULTI W/SPECT W/WALL MOTION / EF
LV dias vol: 53 mL (ref 46–106)
LV sys vol: 17 mL
Peak HR: 82 {beats}/min
Percent HR: 57 %
Rest HR: 73 {beats}/min
SDS: 3
SRS: 2
SSS: 6
TID: 1.03

## 2019-08-10 NOTE — Telephone Encounter (Signed)
Requested  medications are  due for refill today yes  Requested medications are on the active medication list yes  Last refill 3/25  Last visit 07/2019  Notes to clinic Not delegated

## 2019-08-11 ENCOUNTER — Telehealth: Payer: Self-pay

## 2019-08-11 NOTE — Telephone Encounter (Signed)
-----   Message from Wellington Hampshire, MD sent at 08/11/2019 10:08 AM EDT ----- Inform patient that  stress test was normal.

## 2019-08-11 NOTE — Telephone Encounter (Signed)
Call to patient to review stress test results.    Pt verbalized understanding and has no further questions at this time.    Advised pt to call for any further questions or concerns.  No further orders.  Confirmed upcoming u/s appt.

## 2019-08-12 ENCOUNTER — Other Ambulatory Visit: Payer: Self-pay | Admitting: Physician Assistant

## 2019-08-12 DIAGNOSIS — E559 Vitamin D deficiency, unspecified: Secondary | ICD-10-CM

## 2019-08-28 ENCOUNTER — Other Ambulatory Visit: Payer: Self-pay | Admitting: Physician Assistant

## 2019-08-28 DIAGNOSIS — G588 Other specified mononeuropathies: Secondary | ICD-10-CM

## 2019-09-01 ENCOUNTER — Other Ambulatory Visit: Payer: Self-pay | Admitting: Physician Assistant

## 2019-09-01 DIAGNOSIS — E7800 Pure hypercholesterolemia, unspecified: Secondary | ICD-10-CM

## 2019-09-04 IMAGING — CT CT ABD-PELV W/ CM
2 of 5 series · 16 of 46 positions shown, 18 images · IV contrast (iopamidol)
Comparison: None.

CLINICAL DATA: Acute generalized abdominal pain.

EXAM:
CT ABDOMEN AND PELVIS WITH CONTRAST
TECHNIQUE: Multidetector CT imaging of the abdomen and pelvis was performed
using the standard protocol following bolus administration of
intravenous contrast.
CONTRAST:  100mL BDRZS2-DQQ IOPAMIDOL (BDRZS2-DQQ) INJECTION 61%

[Series 2: abd pelvis · axial · 0.76mm/px · z∈[-1498,-1098]mm · 13 of 92 slices shown, 15 images (1 of 2)]
[im 6/92  soft-tissue]
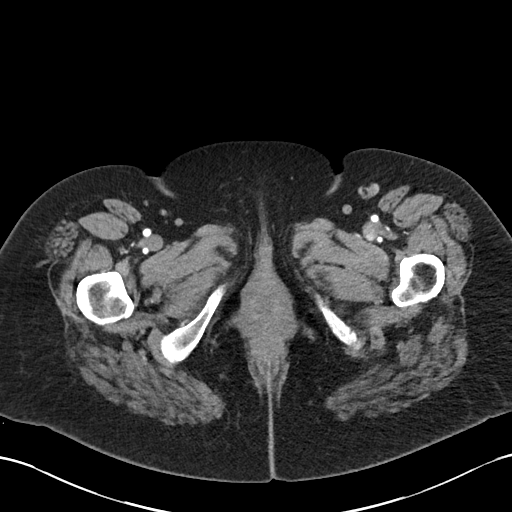
[im 6/92  bone]
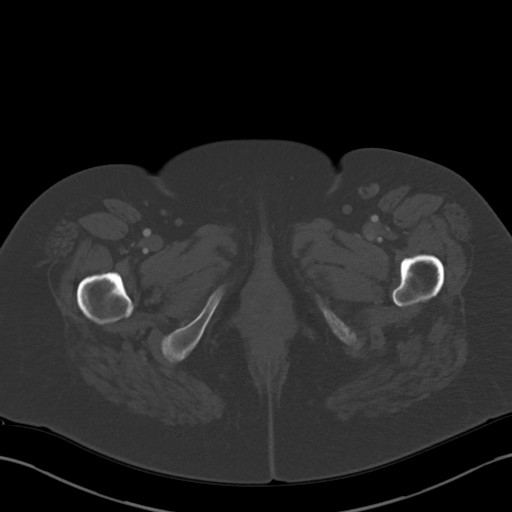
[im 11/92  soft-tissue]
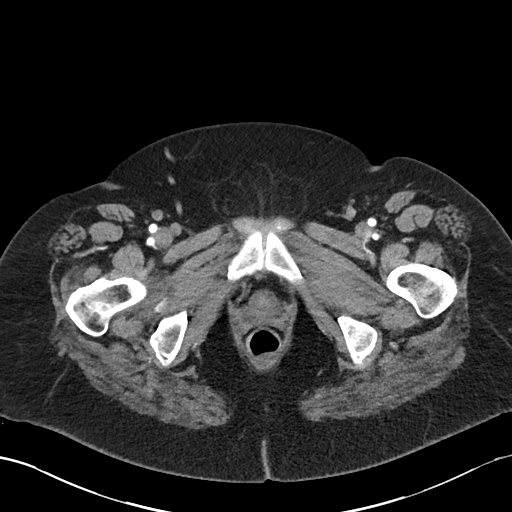
[im 21/92  soft-tissue]
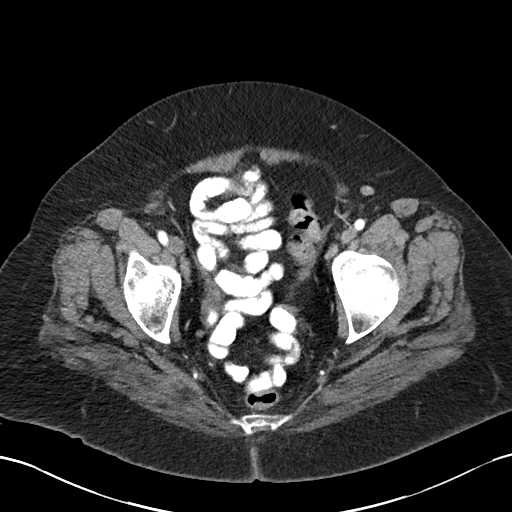
[im 26/92  soft-tissue]
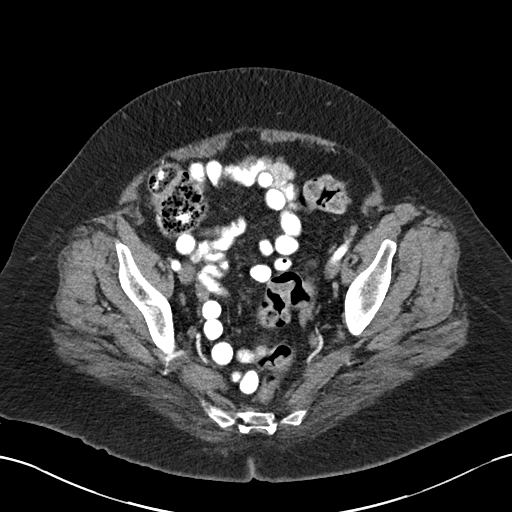
[im 31/92  soft-tissue]
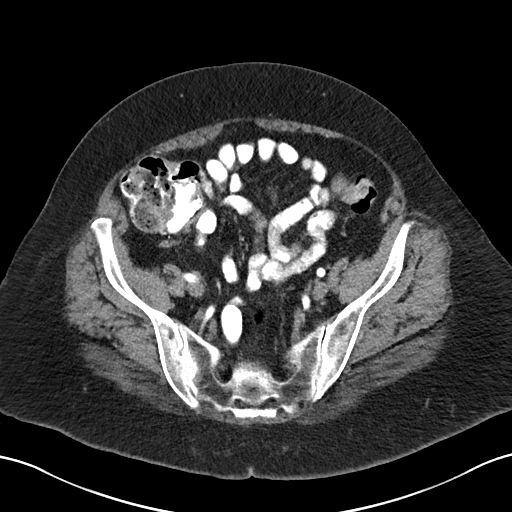
[im 41/92  soft-tissue]
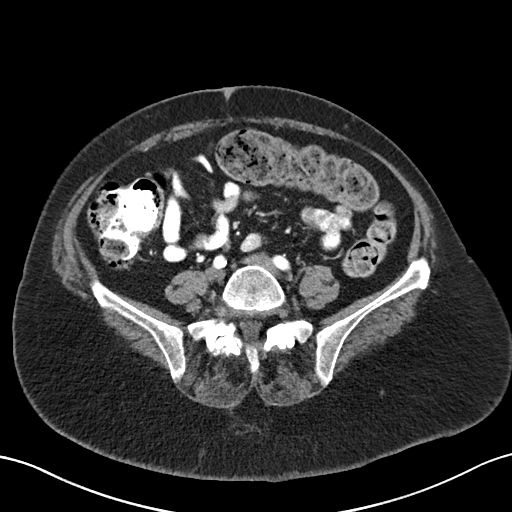
[im 46/92  soft-tissue]
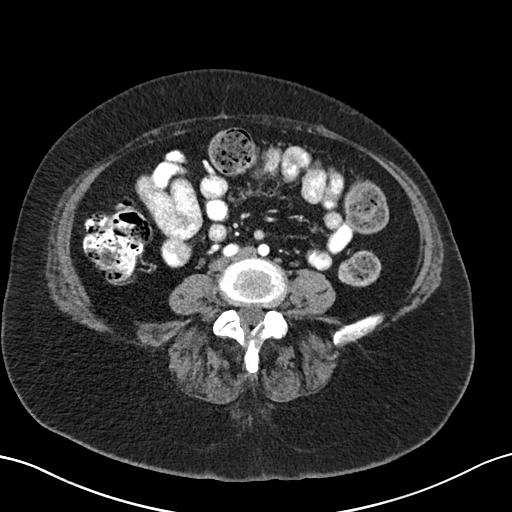
[im 51/92  soft-tissue]
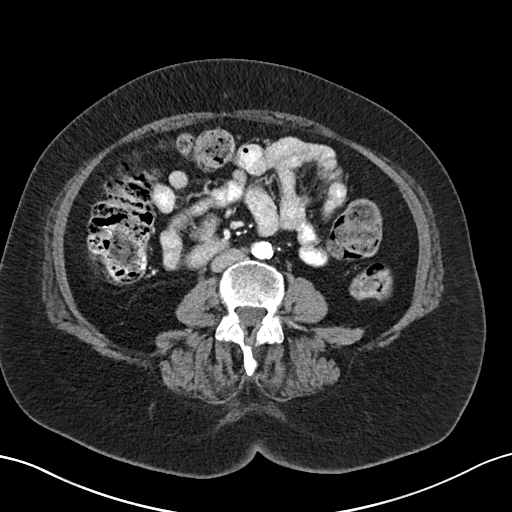
[im 61/92  soft-tissue]
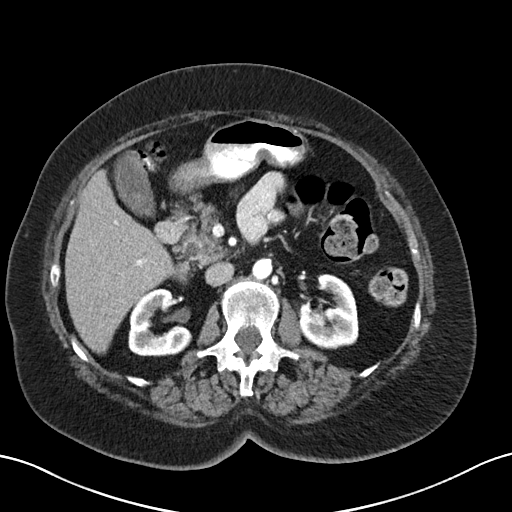
[im 61/92  bone]
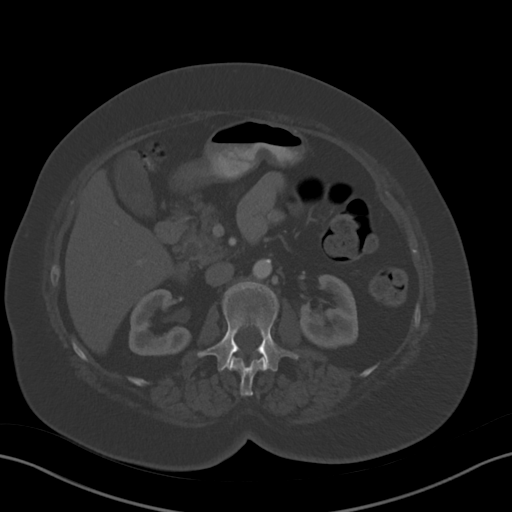
[im 66/92  soft-tissue]
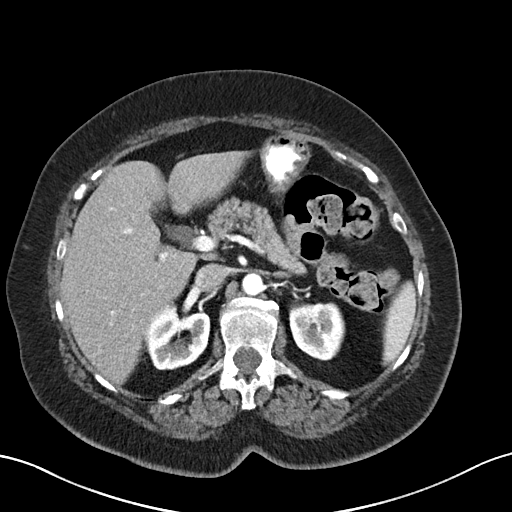
[im 71/92  soft-tissue]
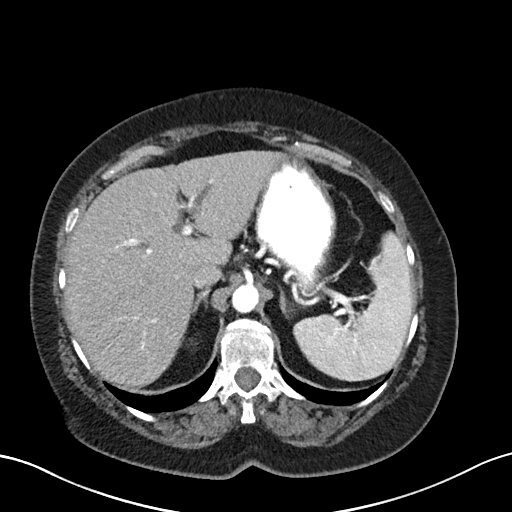
[im 81/92  soft-tissue]
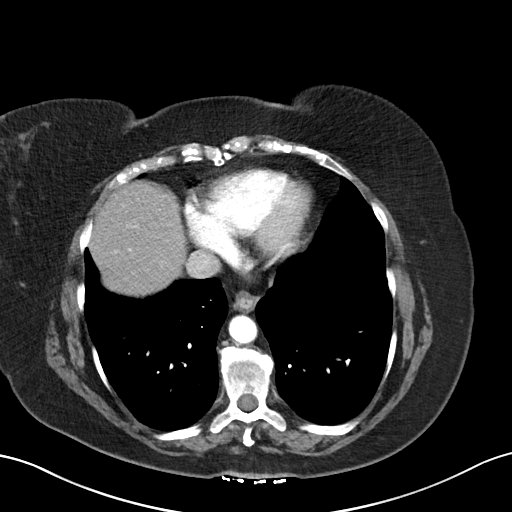
[im 86/92  soft-tissue]
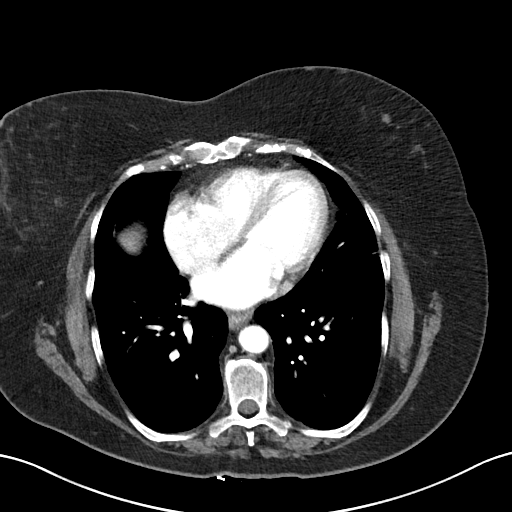

[Series 4: abd pelvis · coronal · 0.76mm/px · 3 of 195 slices shown (2 of 2)]
[im 65/195  soft-tissue]
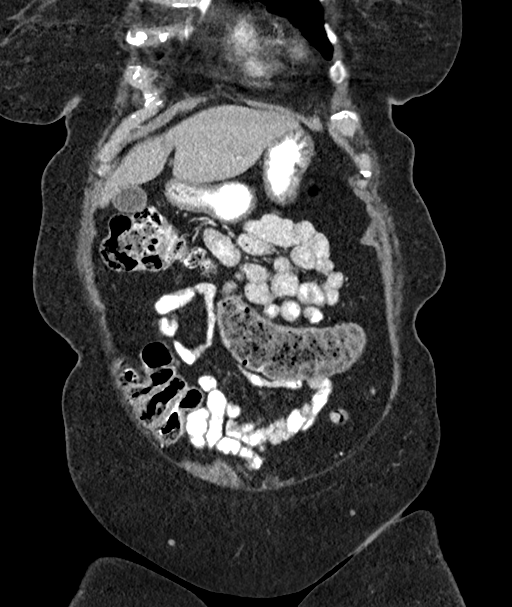
[im 87/195  soft-tissue]
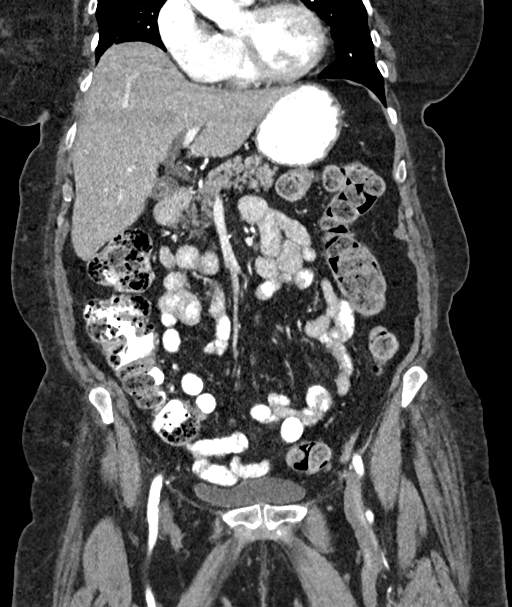
[im 108/195  soft-tissue]
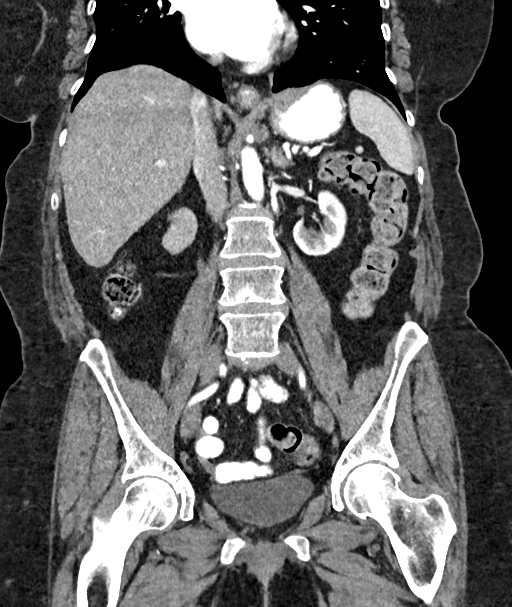

[16 of 46 positions shown; findings below may reference images not displayed]

FINDINGS: Lower chest: No acute abnormality.

Hepatobiliary: Gallstones are noted without inflammation. The liver
appears normal. Intrahepatic and extrahepatic biliary dilatation is
noted.

Pancreas: Unremarkable. No pancreatic ductal dilatation or
surrounding inflammatory changes.

Spleen: Normal in size without focal abnormality.

Adrenals/Urinary Tract: Adrenal glands appear normal. Left renal
cyst is noted. No hydronephrosis or renal obstruction is noted. No
renal or ureteral calculi are noted. Urinary bladder is
unremarkable.

Stomach/Bowel: Stomach is within normal limits. Appendix appears
normal. No evidence of bowel wall thickening, distention, or
inflammatory changes.

Vascular/Lymphatic: Aortic atherosclerosis. No enlarged abdominal or
pelvic lymph nodes.

Reproductive: Status post hysterectomy. No adnexal masses.

Other: No abdominal wall hernia or abnormality. No abdominopelvic
ascites.

Musculoskeletal: No acute or significant osseous findings.
IMPRESSION: Cholelithiasis is noted. Intrahepatic and extrahepatic biliary
dilatation is noted. Correlation with liver function tests as well
as MRCP is recommended evaluate for possible common bile duct
obstruction.

Aortic Atherosclerosis ([id number]).

## 2019-09-06 ENCOUNTER — Ambulatory Visit (INDEPENDENT_AMBULATORY_CARE_PROVIDER_SITE_OTHER): Payer: Medicare PPO

## 2019-09-06 ENCOUNTER — Other Ambulatory Visit: Payer: Self-pay

## 2019-09-06 DIAGNOSIS — R0609 Other forms of dyspnea: Secondary | ICD-10-CM

## 2019-09-06 DIAGNOSIS — R0600 Dyspnea, unspecified: Secondary | ICD-10-CM

## 2019-09-06 MED ORDER — PERFLUTREN LIPID MICROSPHERE
1.0000 mL | INTRAVENOUS | Status: AC | PRN
  Administered 2019-09-06: 2 mL via INTRAVENOUS

## 2019-09-07 ENCOUNTER — Telehealth: Payer: Self-pay

## 2019-09-07 ENCOUNTER — Ambulatory Visit (INDEPENDENT_AMBULATORY_CARE_PROVIDER_SITE_OTHER): Payer: Medicare PPO | Admitting: Nurse Practitioner

## 2019-09-07 ENCOUNTER — Encounter: Payer: Self-pay | Admitting: Nurse Practitioner

## 2019-09-07 ENCOUNTER — Other Ambulatory Visit: Payer: Self-pay | Admitting: Neurology

## 2019-09-07 VITALS — BP 171/81 | HR 81 | Ht 66.0 in | Wt 196.4 lb

## 2019-09-07 DIAGNOSIS — I10 Essential (primary) hypertension: Secondary | ICD-10-CM | POA: Diagnosis not present

## 2019-09-07 DIAGNOSIS — R0609 Other forms of dyspnea: Secondary | ICD-10-CM

## 2019-09-07 DIAGNOSIS — R0600 Dyspnea, unspecified: Secondary | ICD-10-CM | POA: Diagnosis not present

## 2019-09-07 DIAGNOSIS — R0789 Other chest pain: Secondary | ICD-10-CM | POA: Diagnosis not present

## 2019-09-07 DIAGNOSIS — I5189 Other ill-defined heart diseases: Secondary | ICD-10-CM | POA: Diagnosis not present

## 2019-09-07 MED ORDER — CARVEDILOL 12.5 MG PO TABS: 12.5000 mg | ORAL_TABLET | Freq: Two times a day (BID) | ORAL | 3 refills | Status: DC

## 2019-09-07 NOTE — Progress Notes (Signed)
Office Visit    Patient Name: Cathy Barnes Date of Encounter: 09/07/2019  Primary Care Provider:  Mar Daring, PA-C Primary Cardiologist:  Kathlyn Sacramento, MD  Chief Complaint    79 y/o ? with a history of hypertension, hyperlipidemia, borderline diabetes, hypothyroidism, and chronic lymphocytic leukemia, who presents for follow-up after recent stress testing and echocardiogram in the setting of chest pain and dyspnea on exertion.  Past Medical History    Past Medical History:  Diagnosis Date  . Allergy    some  . Cataract    bilaterally both eyes   . Chest pain    a. 07/2019 MV: EF 77%, no ischemia/infarct.  . 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 Diastolic dysfunction    a. 08/2019 Echo: EF 55-60%, no rwma, Gr1 DD, nl RV size/fxn.  . Gallstones   . GERD (gastroesophageal reflux disease)   . Hydrocephalus ([place])    [facility]   . Hyperglycemia   . Hyperlipidemia   . Hypertension   . Lymphocytosis    monoclonal b cell  . Migraine, unspecified, without mention of intractable migraine without mention of status migrainosus 12/14/2012  . Osteoarthritis, chronic   . Osteoporosis   . Peptic ulcer    some bleeding with ulcers as well    Past Surgical History:  Procedure Laterality Date  . ABDOMINAL HYSTERECTOMY    . BREAST CYST ASPIRATION Left 1993   Removed  . breast cyst removed Right    benign   . BREAST LUMPECTOMY Bilateral   . COLONOSCOPY    . ERCP N/A 02/04/2018   Procedure: ENDOSCOPIC RETROGRADE CHOLANGIOPANCREATOGRAPHY (ERCP);  Surgeon: Lucilla Lame, MD;  Location: [facility] ENDOSCOPY;  Service: Endoscopy;  Laterality: N/A;  . ERCP N/A 02/28/2018   Procedure: ENDOSCOPIC RETROGRADE CHOLANGIOPANCREATOGRAPHY (ERCP) STENT REMOVAL;  Surgeon: Lucilla Lame, MD;  Location: [facility] ENDOSCOPY;  Service: Endoscopy;  Laterality: N/A;  . EYE SURGERY    . growth removal Left 2019   L wrist growth removed, abnormal cells  . History of Cataract  surgery Right 2011   left 2011  . PARTIAL HYSTERECTOMY    . POLYPECTOMY    . ROBOTIC ASSISTED LAPAROSCOPIC CHOLECYSTECTOMY-MULTI SITE N/A 03/08/2018   Procedure: ROBOTIC ASSISTED LAPAROSCOPIC CHOLECYSTECTOMY-MULTI SITE;  Surgeon: Jules Husbands, MD;  Location: [facility] ORS;  Service: General;  Laterality: N/A;  . S/P ACL repair Left    knee  . TONSILLECTOMY    . TUBAL LIGATION  1975   Bilateral    Allergies  Allergies  Allergen Reactions  . Shellfish Allergy     Throat closes, rash  . Latex Rash    Tapes,adhesives    History of Present Illness    79 year old female with the above past medical history including hypertension, hyperlipidemia, borderline diabetes, hypothyroidism, and chronic lymphocytic leukemia.  She was seen by cardiologist more than 15 years ago in the setting of chest pain and reportedly underwent stress testing and echocardiography which did not show any significant abnormalities.  She was recently referred to Dr. Fletcher Anon in April of this year in the setting of dyspnea occurring with minimal activity as well as occasional right chest squeezing and discomfort with exertion.  ECG was unremarkable.  She subsequently underwent stress testing which showed no evidence of ischemia or infarct.  Echocardiography was performed and showed normal LV function with grade 1 diastolic dysfunction.  No significant valvular abnormalities were noted.  Since her last visit in mid April, she  has not had any recurrent chest pain but has continued to have chronic, stable DOE.  We reviewed her stress test and echo results today.  BP is elevated today.  She does not routinely follow @ home.  She has tolerated the switch from atenolol to carvedilol.  She notes that over her adult life, she has had brief, episodic lightheadedness with position changes.  She was not orthostatic by examination today.  She denies palpitations, PND, orthopnea, syncope, edema, or early satiety.  Home Medications    Prior  to Admission medications   Medication Sig Start Date End Date Taking? Authorizing Provider  ALPRAZolam Duanne Moron) 0.25 MG tablet TAKE 2 TABLETS(0.5 MG) BY MOUTH AT BEDTIME 08/10/19   Mar Daring, PA-C  aspirin 81 MG tablet Take 81 mg by mouth daily.    [provider]  carvedilol (COREG) 6.25 MG tablet Take 1 tablet (6.25 mg total) by mouth 2 (two) times daily. 08/01/19   Wellington Hampshire, MD  cyclobenzaprine (FLEXERIL) 10 MG tablet TAKE 1 TABLET(10 MG) BY MOUTH AT BEDTIME 09/07/19   [name], MD  donepezil (ARICEPT) 10 MG tablet Take 1 tablet (10 mg total) by mouth at bedtime. Appointment needed around May 2021 for further refills. 07/17/19   [name], MD  ezetimibe (ZETIA) 10 MG tablet TAKE 1 TABLET BY MOUTH AT BEDTIME 09/01/19   Mar Daring, PA-C  gabapentin (NEURONTIN) 600 MG tablet TAKE 1/2 TABLET BY MOUTH AT BREAKFAST, 1/2 AT LUNCH, AND 1 AT BEDTIME 08/28/19   Mar Daring, PA-C  HYDROcodone-Acetaminophen 5-300 MG TABS Take 1 tablet by mouth 2 (two) times daily as needed. 04/03/19   Mar Daring, PA-C  levothyroxine (SYNTHROID) 112 MCG tablet Take 112 mcg by mouth daily before breakfast.    [provider]  losartan (COZAAR) 50 MG tablet TAKE 1 TABLET BY MOUTH AT BEDTIME 03/27/19   Mar Daring, PA-C  Magnesium 250 MG TABS Take 1 tablet by mouth daily.    [provider]  potassium chloride SA (K-DUR) 20 MEQ tablet TAKE 1 TABLET BY MOUTH TWICE DAILY 01/02/19   Mar Daring, PA-C  solifenacin (VESICARE) 10 MG tablet TAKE 1 TABLET BY MOUTH EVERY DAY 06/02/19   Mar Daring, PA-C  triamterene-hydrochlorothiazide (MAXZIDE) 75-50 MG tablet TAKE 1/2 TABLET BY MOUTH DAILY 11/10/18   Mar Daring, PA-C  verapamil (VERELAN PM) 120 MG 24 hr capsule TAKE 1 CAPSULE BY MOUTH EVERY DAY. Called [phone] to schedule an appointment for further refills. 03/06/19   [name], MD  zolpidem (AMBIEN) 10 MG  tablet TAKE 1 TABLET BY MOUTH EVERY DAY AT BEDTIME 05/22/19   Mar Daring, PA-C    Review of Systems    Stable, chronic dyspnea on exertion.  She notes that she forgot to mention her last visit that over her adult life, she has experienced lightheadedness with position changes.  She denies chest pain, palpitations, PND, orthopnea, syncope, edema, or early satiety.  All other systems reviewed and are otherwise negative except as noted above.  Physical Exam    VS:  BP (!) 171/81 (BP Location: Left Arm, Patient Position: Lying right side, Cuff Size: Normal)   Pulse 81   Ht 5\' 6"  (1.676 m)   Wt 196 lb 6.4 oz (89.1 kg)   SpO2 95%   BMI 31.70 kg/m  , BMI Body mass index is 31.7 kg/m.  Orthostatic VS for the past 24 hrs:  BP- Lying Pulse-  Lying BP- Sitting Pulse- Sitting BP- Standing at 0 minutes Pulse- Standing at 0 minutes  09/07/19 1352 161/76 81 143/72 83 150/78 85   GEN: Well nourished, well developed, in no acute distress. HEENT: normal. Neck: Supple, no JVD, carotid bruits, or masses. Cardiac: RRR, no murmurs, rubs, or gallops. No clubbing, cyanosis, edema.  Radials/PT 2+ and equal bilaterally.  Respiratory:  Respirations regular and unlabored, clear to auscultation bilaterally. GI: Soft, nontender, nondistended, BS + x 4. MS: no deformity or atrophy. Skin: warm and dry, no rash. Neuro:  Strength and sensation are intact. Psych: Normal affect.  Accessory Clinical Findings    ECG personally reviewed by me today -regular sinus rhythm, 81 - no acute changes.  Lab Results  Component Value Date   WBC 15.8 (H) 06/16/2019   HGB 13.4 06/16/2019   HCT 42.4 06/16/2019   MCV 93.0 06/16/2019   PLT 298 06/16/2019   Lab Results  Component Value Date   CREATININE 1.11 (H) 06/16/2019   BUN 20 06/16/2019   NA 140 06/16/2019   K 3.8 06/16/2019   CL 103 06/16/2019   CO2 26 06/16/2019   Lab Results  Component Value Date   ALT 26 06/16/2019   AST 26 06/16/2019   ALKPHOS 92  06/16/2019   BILITOT 0.4 06/16/2019   Lab Results  Component Value Date   CHOL 181 07/26/2019   HDL 44 07/26/2019   LDLCALC 111 (H) 07/26/2019   TRIG 148 07/26/2019   CHOLHDL 3.9 09/23/2017    Lab Results  Component Value Date   HGBA1C 6.2 (H) 07/26/2019    Assessment & Plan    1.  Dyspnea on exertion: This is somewhat chronic and unchanged since prior visit.  Recent stress testing was nonischemic while echocardiogram showed normal LV function with grade 1 diastolic dysfunction.  Blood pressure is elevated today at 171/81 and I am titrating her carvedilol.  In light of testing findings, I did recommend regular exercise however, activity is limited in the setting of chronic low back pain.  She does have a recumbent bicycle which she can use without significant back pain and I have encouraged her to do so.  2.  Atypical chest pain: Resolved.  Recent negative stress test.  3.  Diastolic dysfunction: Noted on recent echo.  Heart rate stable however blood pressure elevated after switching from atenolol to carvedilol.  Titrating carvedilol to 12.5 mg twice daily.  4.  Essential hypertension: See #3.  She reports a long history of lightheadedness with position changes, whether she is moving to stand or moving to lie down.  Orthostatic vital signs checked today and she did have a drop when moving from lying to sitting but then this stabilized when moving to a standing position.  She was not symptomatic today.  In the setting of hypertension, I am titrating her carvedilol to 12.5 mg twice daily.  We will plan to reevaluate in approximately 1 month and titrate either carvedilol or losartan if necessary.  5.  HL:  LDL of 111 in April.  She is on Zetia therapy.  6.  Disposition: Follow-up in 1 month to reevaluate blood pressure.  Murray Hodgkins, NP 09/07/2019, 4:24 PM

## 2019-09-07 NOTE — Patient Instructions (Addendum)
Medication Instructions:  1- INCREASE Coreg to Take 1 tablet (12.5 mg total) by mouth 2 (two) times daily *If you need a refill on your cardiac medications before your next appointment, please call your pharmacy*   Lab Work: None ordered  If you have labs (blood work) drawn today and your tests are completely normal, you will receive your results only by: [name] MyChart Message (if you have MyChart) OR . A paper copy in the mail If you have any lab test that is abnormal or we need to change your treatment, we will call you to review the results.   Testing/Procedures: None ordered    Follow-Up: At [facility], you and your health needs are our priority.  As part of our continuing mission to provide you with exceptional heart care, we have created designated Provider Care Teams.  These Care Teams include your primary Cardiologist (physician) and Advanced Practice Providers (APPs -  Physician Assistants and Nurse Practitioners) who all work together to provide you with the care you need, when you need it.  We recommend signing up for the patient portal called "MyChart".  Sign up information is provided on this After Visit Summary.  MyChart is used to connect with patients for Virtual Visits (Telemedicine).  Patients are able to view lab/test results, encounter notes, upcoming appointments, etc.  Non-urgent messages can be sent to your provider as well.   To learn more about what you can do with MyChart, go to [email or web address].    Your next appointment:   1 month(s)  The format for your next appointment:   In Person  Provider:   Murray Hodgkins, NP   Other Instructions Take blood pressure and heart rate daily and bring readings into your next appointment.  How to use a home blood pressure monitor. . Be still. Don't smoke, drink caffeinated beverages or exercise within 30 minutes before measuring your blood pressure. . Sit correctly. Sit with your back straight and supported  (on a dining chair, rather than a sofa). Your feet should be flat on the floor and your legs should not be crossed. Your arm should be supported on a flat surface (such as a table) with the upper arm at heart level. Make sure the bottom of the cuff is placed directly above the bend of the elbow.  . Measure at the same time every day. It's important to take the readings at the same time each day, such as morning and evening. Take reading approximately 1 hour after BP medications.

## 2019-09-07 NOTE — Telephone Encounter (Signed)
DPR on file. Called to give the patient echo results. lmom with results patient is to contact the office if any questions or concerns.

## 2019-09-07 NOTE — Telephone Encounter (Signed)
-----   Message from Wellington Hampshire, MD sent at 09/07/2019  1:19 PM EDT ----- Inform patient that echo was fine.  Normal ejection fraction with no significant valvular abnormalities.

## 2019-09-11 ENCOUNTER — Other Ambulatory Visit: Payer: Self-pay | Admitting: Neurology

## 2019-09-14 ENCOUNTER — Other Ambulatory Visit: Payer: Self-pay | Admitting: Neurology

## 2019-09-21 ENCOUNTER — Other Ambulatory Visit: Payer: Self-pay | Admitting: Physician Assistant

## 2019-09-21 DIAGNOSIS — I10 Essential (primary) hypertension: Secondary | ICD-10-CM

## 2019-09-22 ENCOUNTER — Other Ambulatory Visit: Payer: Self-pay | Admitting: Physician Assistant

## 2019-10-06 ENCOUNTER — Other Ambulatory Visit: Payer: Self-pay | Admitting: Physician Assistant

## 2019-10-06 DIAGNOSIS — E7800 Pure hypercholesterolemia, unspecified: Secondary | ICD-10-CM

## 2019-10-11 ENCOUNTER — Other Ambulatory Visit: Payer: Self-pay | Admitting: Physician Assistant

## 2019-10-11 DIAGNOSIS — N3281 Overactive bladder: Secondary | ICD-10-CM

## 2019-10-18 ENCOUNTER — Other Ambulatory Visit: Payer: Self-pay

## 2019-10-18 ENCOUNTER — Ambulatory Visit: Payer: Medicare PPO | Admitting: Nurse Practitioner

## 2019-10-18 ENCOUNTER — Encounter: Payer: Self-pay | Admitting: Nurse Practitioner

## 2019-10-18 VITALS — BP 118/70 | HR 68 | Ht 66.0 in | Wt 198.1 lb

## 2019-10-18 DIAGNOSIS — I10 Essential (primary) hypertension: Secondary | ICD-10-CM | POA: Diagnosis not present

## 2019-10-18 DIAGNOSIS — R0789 Other chest pain: Secondary | ICD-10-CM

## 2019-10-18 DIAGNOSIS — R0609 Other forms of dyspnea: Secondary | ICD-10-CM

## 2019-10-18 DIAGNOSIS — R0600 Dyspnea, unspecified: Secondary | ICD-10-CM | POA: Diagnosis not present

## 2019-10-18 NOTE — Patient Instructions (Signed)
Medication Instructions:  Your physician recommends that you continue on your current medications as directed. Please refer to the Current Medication list given to you today.  *If you need a refill on your cardiac medications before your next appointment, please call your pharmacy*   Lab Work: None ordered If you have labs (blood work) drawn today and your tests are completely normal, you will receive your results only by: . MyChart Message (if you have MyChart) OR . A paper copy in the mail If you have any lab test that is abnormal or we need to change your treatment, we will call you to review the results.   Testing/Procedures: None ordered   Follow-Up: At [facility], you and your health needs are our priority.  As part of our continuing mission to provide you with exceptional heart care, we have created designated Provider Care Teams.  These Care Teams include your primary Cardiologist (physician) and Advanced Practice Providers (APPs -  Physician Assistants and Nurse Practitioners) who all work together to provide you with the care you need, when you need it.  We recommend signing up for the patient portal called "MyChart".  Sign up information is provided on this After Visit Summary.  MyChart is used to connect with patients for Virtual Visits (Telemedicine).  Patients are able to view lab/test results, encounter notes, upcoming appointments, etc.  Non-urgent messages can be sent to your provider as well.   To learn more about what you can do with MyChart, go to [email or web address].    Your next appointment:   6 month(s)  The format for your next appointment:   In Person  Provider:    You may see Muhammad Arida, MD or Christopher Berge, NP 

## 2019-10-18 NOTE — Progress Notes (Signed)
Office Visit    Patient Name: Cathy Barnes Date of Encounter: 10/18/2019  Primary Care Provider:  Mar Daring, PA-C Primary Cardiologist:  Cathy Sacramento, MD  Chief Complaint    79 y/o ? with a history of hypertension, hyperlipidemia, borderline diabetes, hypothyroidism, and chronic lymphocytic leukemia, who presents for follow-up related to dyspnea.  Past Medical History    Past Medical History:  Diagnosis Date  . Allergy    some  . Cataract    bilaterally both eyes   . Chest pain    a. 07/2019 MV: EF 77%, no ischemia/infarct.  . 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 Diastolic dysfunction    a. 08/2019 Echo: EF 55-60%, no rwma, Gr1 DD, nl RV size/fxn.  . Gallstones   . GERD (gastroesophageal reflux disease)   . Hydrocephalus ([place])    Cathy Barnes   . Hyperglycemia   . Hyperlipidemia   . Hypertension   . Lymphocytosis    monoclonal b cell  . Migraine, unspecified, without mention of intractable migraine without mention of status migrainosus 12/14/2012  . Osteoarthritis, chronic   . Osteoporosis   . Peptic ulcer    some bleeding with ulcers as well    Past Surgical History:  Procedure Laterality Date  . ABDOMINAL HYSTERECTOMY    . BREAST CYST ASPIRATION Left 1993   Removed  . breast cyst removed Right    benign   . BREAST LUMPECTOMY Bilateral   . COLONOSCOPY    . ERCP N/A 02/04/2018   Procedure: ENDOSCOPIC RETROGRADE CHOLANGIOPANCREATOGRAPHY (ERCP);  Surgeon: Cathy Lame, MD;  Location: [facility] ENDOSCOPY;  Service: Endoscopy;  Laterality: N/A;  . ERCP N/A 02/28/2018   Procedure: ENDOSCOPIC RETROGRADE CHOLANGIOPANCREATOGRAPHY (ERCP) STENT REMOVAL;  Surgeon: Cathy Lame, MD;  Location: [facility] ENDOSCOPY;  Service: Endoscopy;  Laterality: N/A;  . EYE SURGERY    . growth removal Left 2019   L wrist growth removed, abnormal cells  . History of Cataract surgery Right 2011   left 2011  . PARTIAL HYSTERECTOMY    . POLYPECTOMY    .  ROBOTIC ASSISTED LAPAROSCOPIC CHOLECYSTECTOMY-MULTI SITE N/A 03/08/2018   Procedure: ROBOTIC ASSISTED LAPAROSCOPIC CHOLECYSTECTOMY-MULTI SITE;  Surgeon: Cathy Husbands, MD;  Location: [facility] ORS;  Service: General;  Laterality: N/A;  . S/P ACL repair Left    knee  . TONSILLECTOMY    . TUBAL LIGATION  1975   Bilateral    Allergies  Allergies  Allergen Reactions  . Shellfish Allergy     Throat closes, rash  . Latex Rash    Tapes,adhesives    History of Present Illness    79 year old female with above past medical history including hypertension, hyperlipidemia, borderline diabetes, hypothyroidism, and chronic lymphocytic leukemia.  She was seen by cardiology more than 15 years ago in the setting of chest pain and reportedly underwent stress testing and echocardiography which showed no significant abnormalities.  She was referred to Cathy Barnes in April 2021 in the setting of dyspnea occurring with minimal activity as well as occasional right chest squeezing and discomfort with exertion.  ECG was unremarkable.  She subsequently underwent stress testing which showed no ischemia or infarct.  Echocardiography showed normal LV function with grade 1 diastolic dysfunction.  No significant valvular abnormalities were noted.  Cathy Barnes was last seen in clinic in late May, at which time she noted stable dyspnea on exertion without any recurrence of chest pain.  She did complain of episodic lightheadedness  with position changes and was noted to have a drop in blood pressure when moving from lying to sitting but this did not worsen with standing.  She was hypertensive at clinic visit and carvedilol was increased to 12.5 mg twice daily.  Since then, she has noticed significant improvement in blood pressures at home, now trending in the 1 teens to 130 range.  She is also noted a reduction in orthostatic symptoms though she cannot be sure if she is just managing the better by being more intentional when changing  positions.  She notes that she has ongoing mild right-sided chest discomfort which is overall unchanged, though she did not report this at her last visit.  She denies PND, orthopnea, dizziness, syncope, edema, or early satiety.  She has been having headaches and plans to follow-up with neurology.  Home Medications    Prior to Admission medications   Medication Sig Start Date End Date Taking? Authorizing Provider  ALPRAZolam Cathy Barnes) 0.25 MG tablet TAKE 2 TABLETS(0.5 MG) BY MOUTH AT BEDTIME 08/10/19  Yes Cathy Daring, PA-C  aspirin 81 MG tablet Take 81 mg by mouth daily.   Yes [provider]  carvedilol (COREG) 12.5 MG tablet Take 1 tablet (12.5 mg total) by mouth 2 (two) times daily. 09/07/19  Yes Cathy Gianotti, NP  cyclobenzaprine (FLEXERIL) 10 MG tablet TAKE 1 TABLET(10 MG) BY MOUTH AT BEDTIME 09/07/19  Yes Cathy Beam, MD  donepezil (ARICEPT) 10 MG tablet Take 1 tablet (10 mg total) by mouth at bedtime. Appointment needed around May 2021 for further refills. 07/17/19  Yes Cathy Beam, MD  ezetimibe (ZETIA) 10 MG tablet TAKE 1 TABLET BY MOUTH AT BEDTIME 10/06/19  Yes Cathy Barnes, Cathy Sorrel, PA-C  gabapentin (NEURONTIN) 600 MG tablet TAKE 1/2 TABLET BY MOUTH AT BREAKFAST, 1/2 AT LUNCH, AND 1 AT BEDTIME 08/28/19  Yes Cathy Barnes, Cathy Sorrel, PA-C  HYDROcodone-Acetaminophen 5-300 MG TABS Take 1 tablet by mouth 2 (two) times daily as needed. 04/03/19  Yes Cathy Daring, PA-C  levothyroxine (SYNTHROID) 112 MCG tablet Take 112 mcg by mouth daily before breakfast.   Yes [provider]  losartan (COZAAR) 50 MG tablet TAKE 1 TABLET BY MOUTH AT BEDTIME 09/21/19  Yes Cathy Barnes, Cathy Sorrel, PA-C  Magnesium 250 MG TABS Take 1 tablet by mouth daily.   Yes [provider]  potassium chloride SA (K-DUR) 20 MEQ tablet TAKE 1 TABLET BY MOUTH TWICE DAILY 01/02/19  Yes Cathy Barnes M, PA-C  solifenacin (VESICARE) 10 MG tablet TAKE 1 TABLET BY MOUTH EVERY DAY  10/11/19  Yes Cathy Barnes, Cathy M, PA-C  triamterene-hydrochlorothiazide (MAXZIDE) 75-50 MG tablet TAKE 1/2 TABLET BY MOUTH DAILY 11/10/18  Yes Cathy Barnes M, PA-C  verapamil (VERELAN PM) 120 MG 24 hr capsule TAKE 1 CAPSULE BY MOUTH EVERYDAY(MUST SCHEDULE APPT FOR FURTHER REFILLS) 09/14/19  Yes Cathy Beam, MD  zolpidem (AMBIEN) 10 MG tablet TAKE 1 TABLET BY MOUTH EVERY DAY AT BEDTIME 05/22/19  Yes Cathy Daring, PA-C    Review of Systems    Overall doing well with chronic, mild right-sided chest discomfort.  She has been having headaches and does have a prior history of headaches for which she sees neurology.  She denies dyspnea, palpitations, PND, orthopnea, dizziness, syncope, edema, or early satiety.  All other systems reviewed and are otherwise negative except as noted above.  Physical Exam    VS:  BP 118/70 (BP Location: Left Arm, Patient Position: Sitting, Cuff Size: Normal)  Pulse 68   Ht 5\' 6"  (1.676 Barnes)   Wt 198 lb 2 oz (89.9 kg)   SpO2 96%   BMI 31.98 kg/Barnes  , BMI Body mass index is 31.98 kg/Barnes. GEN: Well nourished, well developed, in no acute distress. HEENT: normal. Neck: Supple, no JVD, carotid bruits, or masses. Cardiac: RRR, no murmurs, rubs, or gallops. No clubbing, cyanosis, edema.  Radials/PT 2+ and equal bilaterally.  Respiratory:  Respirations regular and unlabored, clear to auscultation bilaterally. GI: Soft, nontender, nondistended, BS + x 4. MS: no deformity or atrophy. Skin: warm and dry, no rash. Barnes:  Strength and sensation are intact. Psych: Normal affect.  Accessory Clinical Findings    ECG personally reviewed by me today -regular sinus rhythm, 68- no acute changes.  Lab Results  Component Value Date   WBC 15.8 (H) 06/16/2019   HGB 13.4 06/16/2019   HCT 42.4 06/16/2019   MCV 93.0 06/16/2019   PLT 298 06/16/2019   Lab Results  Component Value Date   CREATININE 1.11 (H) 06/16/2019   BUN 20 06/16/2019   NA 140 06/16/2019   K 3.8  06/16/2019   CL 103 06/16/2019   CO2 26 06/16/2019   Lab Results  Component Value Date   ALT 26 06/16/2019   AST 26 06/16/2019   ALKPHOS 92 06/16/2019   BILITOT 0.4 06/16/2019   Lab Results  Component Value Date   CHOL 181 07/26/2019   HDL 44 07/26/2019   LDLCALC 111 (H) 07/26/2019   TRIG 148 07/26/2019   CHOLHDL 3.9 09/23/2017    Lab Results  Component Value Date   HGBA1C 6.2 (H) 07/26/2019    Assessment & Plan    1.  Atypical chest pain: Patient with a history of right-sided chest discomfort which has been persistent.  Recent echo showed normal LV function while stress testing was nonischemic.  No further ischemic evaluation warranted at this time.  2.  Essential hypertension: Improved since titrating carvedilol.  She does have intermittent orthostatic lightheadedness however she notes that this has improved also as she has been more intentional when changing positions.  Continue current doses of carvedilol, losartan, Maxide, and verapamil.  3.  Dyspnea on exertion: Stable.  4.  Hyperlipidemia: She is on Zetia therapy with an LDL of 111 in April.  5.  Headaches: She has a prior history of migraines and over the past few weeks has noted intermittent headaches later in the day.  She plans to follow-up with her neurologist.  6.  Disposition: Follow-up in 6 months or sooner if necessary.  Murray Hodgkins, NP 10/18/2019, 3:44 PM

## 2019-10-25 ENCOUNTER — Other Ambulatory Visit: Payer: Self-pay | Admitting: Physician Assistant

## 2019-10-27 ENCOUNTER — Other Ambulatory Visit: Payer: Self-pay | Admitting: Physician Assistant

## 2019-10-27 DIAGNOSIS — I10 Essential (primary) hypertension: Secondary | ICD-10-CM

## 2019-11-02 ENCOUNTER — Telehealth (INDEPENDENT_AMBULATORY_CARE_PROVIDER_SITE_OTHER): Payer: Medicare PPO | Admitting: Physician Assistant

## 2019-11-02 ENCOUNTER — Encounter: Payer: Self-pay | Admitting: Physician Assistant

## 2019-11-02 ENCOUNTER — Other Ambulatory Visit: Payer: Self-pay | Admitting: Physician Assistant

## 2019-11-02 VITALS — BP 147/69 | HR 75 | Wt 198.0 lb

## 2019-11-02 DIAGNOSIS — M79641 Pain in right hand: Secondary | ICD-10-CM | POA: Diagnosis not present

## 2019-11-02 DIAGNOSIS — K591 Functional diarrhea: Secondary | ICD-10-CM

## 2019-11-02 DIAGNOSIS — L299 Pruritus, unspecified: Secondary | ICD-10-CM

## 2019-11-02 DIAGNOSIS — M545 Low back pain, unspecified: Secondary | ICD-10-CM | POA: Insufficient documentation

## 2019-11-02 DIAGNOSIS — G43719 Chronic migraine without aura, intractable, without status migrainosus: Secondary | ICD-10-CM | POA: Diagnosis not present

## 2019-11-02 DIAGNOSIS — E034 Atrophy of thyroid (acquired): Secondary | ICD-10-CM

## 2019-11-02 DIAGNOSIS — M79642 Pain in left hand: Secondary | ICD-10-CM | POA: Insufficient documentation

## 2019-11-02 MED ORDER — LEVOTHYROXINE SODIUM 112 MCG PO TABS: 112.0000 ug | ORAL_TABLET | Freq: Every day | ORAL | 1 refills | Status: DC

## 2019-11-02 MED ORDER — PREDNISONE 10 MG (21) PO TBPK: ORAL_TABLET | ORAL | 0 refills | Status: DC

## 2019-11-02 MED ORDER — COLESTIPOL HCL 1 G PO TABS: 1.0000 g | ORAL_TABLET | Freq: Two times a day (BID) | ORAL | 0 refills | Status: DC

## 2019-11-02 NOTE — Assessment & Plan Note (Signed)
Suspect due to cholecystectomy Will start Colestipol as below.  F/U in 4 weeks.

## 2019-11-02 NOTE — Progress Notes (Signed)
Virtual telephone visit    Virtual Visit via Telephone Note   This visit type was conducted due to national recommendations for restrictions regarding the COVID-19 Pandemic (e.g. social distancing) in an effort to limit this patient's exposure and mitigate transmission in our community. Due to her co-morbid illnesses, this patient is at least at moderate risk for complications without adequate follow up. This format is felt to be most appropriate for this patient at this time. The patient did not have access to video technology or had technical difficulties with video requiring transitioning to audio format only (telephone). Physical exam was limited to content and character of the telephone conversation.    I connected with Cathy Barnes on 11/02/19 at 11:00 AM EDT by a video enabled telemedicine application and verified that I am speaking with the correct person using two identifiers.  I discussed the limitations of evaluation and management by telemedicine and the availability of in person appointments. The patient expressed understanding and agreed to proceed.  Patient location: Home Provider location: [facility]   Visit Date: 11/02/2019  Today's healthcare provider: Mar Daring, PA-C   Chief Complaint  Patient presents with  . Back Pain  . Headache  . Diarrhea  . Joint Pain   Subjective    HPI  Patient C/O diarrhea on and off x several months. Patient reports diarrhea has been present since having gallbladder surgery a year ago. Patient reports taking Imodium daily, reports mild control. Patient denies any blood in stools. Patient reports she did change her diet by cutting out gluten foods. Patient reports she started gluten free diet one week ago.   Patient C/O headaches x several years on and off. Patient reports headaches have improved, and not sure if the change in diet helped.   Patient C/O two areas of patch on her back, reports very itchy. Patient reports this  happened last Friday. Patient reports tingle sensation all over body last week. Patient reports symptoms lasted one day only. Patient reports taking Benadryl at night and reports mild symptom control. Patient reports a total of 4 episodes in the last few weeks.    Patient C/O joint pain in wrist and hands x one month. Patient denies any falls or injuries. Patient reports swelling in joints around thumbs and both wrist. Patient reports swelling is present day and night. Patient reports taking Tylenol and reports mild pain control.   Patient C/O lower back pain on left side worsen when walking. Patient reports pain has been present for one month. Patient reports taking Norco, Ibuprofen, and Tylenol reports no pain control. Patient denies any falls or injures.    Patient Active Problem List   Diagnosis Date Noted  . Chronic cholecystitis   . Abnormal computed tomography of large intestine   . Benign neoplasm of ascending colon   . Neoplasm of digestive system   . Presence of other specified functional implants   . Encounter for fitting and adjustment of other gastrointestinal appliance and device   . Calculus of common duct   . Abnormal findings on diagnostic imaging of gall bladder   . FHx: dementia 07/06/2017  . 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 Cellulitis 08/22/2015  . 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 Overactive bladder 05/30/2015  . Mild cognitive impairment 02/07/2015  . Bloodgood disease 12/25/2014  . Colon polyp 12/25/2014  . 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 Cramps of lower extremity 12/25/2014  . Abnormal cerebrospinal fluid 12/25/2014  .  Narrowing of intervertebral disc space 12/25/2014  . Contracture of palmar fascia (Dupuytren's) 12/25/2014  . Breath shortness 12/25/2014  . Essential (primary) hypertension 12/25/2014  . Benign essential tremor 12/25/2014  . Abnormal gait 12/25/2014  . Hypercholesteremia 12/25/2014  . Blood  glucose elevated 12/25/2014  . Decreased potassium in the blood 12/25/2014  . Hypothyroidism 12/25/2014  . Insomnia 12/25/2014  . Headache, migraine, intractable 12/25/2014  . 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 Fibrositis 12/25/2014  . Discoloration of nail 12/25/2014  . 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 Arthritis sicca 12/25/2014  . Burning or prickling sensation 12/25/2014  . Post menopausal syndrome 12/25/2014  . Avitaminosis D 12/25/2014  . Migraine 12/14/2012   Social History   Tobacco Use  . Smoking status: Never Smoker  . Smokeless tobacco: Never Used  Vaping Use  . Vaping Use: Never used  Substance Use Topics  . Alcohol use: Yes    Alcohol/week: 1.0 standard drink    Types: 1 Glasses of wine per week    Comment: Rare- wine on occasion  . Drug use: No      Medications: Outpatient Medications Prior to Visit  Medication Sig  . ALPRAZolam (XANAX) 0.25 MG tablet TAKE 2 TABLETS(0.5 MG) BY MOUTH AT BEDTIME  . aspirin 81 MG tablet Take 81 mg by mouth daily.  . carvedilol (COREG) 12.5 MG tablet Take 1 tablet (12.5 mg total) by mouth 2 (two) times daily.  . cyclobenzaprine (FLEXERIL) 10 MG tablet TAKE 1 TABLET(10 MG) BY MOUTH AT BEDTIME  . donepezil (ARICEPT) 10 MG tablet Take 1 tablet (10 mg total) by mouth at bedtime. Appointment needed around May 2021 for further refills.  . ezetimibe (ZETIA) 10 MG tablet TAKE 1 TABLET BY MOUTH AT BEDTIME  . gabapentin (NEURONTIN) 600 MG tablet TAKE 1/2 TABLET BY MOUTH AT BREAKFAST, 1/2 AT LUNCH, AND 1 AT BEDTIME  . HYDROcodone-Acetaminophen 5-300 MG TABS Take 1 tablet by mouth 2 (two) times daily as needed.  [name] levothyroxine (SYNTHROID) 112 MCG tablet Take 112 mcg by mouth daily before breakfast.  . losartan (COZAAR) 50 MG tablet TAKE 1 TABLET BY MOUTH AT BEDTIME  . Magnesium 250 MG TABS Take 1 tablet by mouth daily.  . potassium chloride SA (K-DUR) 20 MEQ tablet TAKE 1 TABLET BY MOUTH TWICE DAILY  . solifenacin  (VESICARE) 10 MG tablet TAKE 1 TABLET BY MOUTH EVERY DAY  . triamterene-hydrochlorothiazide (MAXZIDE) 75-50 MG tablet TAKE 1/2 TABLET BY MOUTH DAILY  . verapamil (VERELAN PM) 120 MG 24 hr capsule TAKE 1 CAPSULE BY MOUTH EVERYDAY(MUST SCHEDULE APPT FOR FURTHER REFILLS)  . zolpidem (AMBIEN) 10 MG tablet TAKE 1 TABLET BY MOUTH EVERY DAY AT BEDTIME   No facility-administered medications prior to visit.    Review of Systems  Constitutional: Positive for fatigue. Negative for fever.  HENT: Negative.   Respiratory: Negative.   Cardiovascular: Negative.   Gastrointestinal: Positive for abdominal distention and diarrhea. Negative for blood in stool, nausea and vomiting.  Musculoskeletal: Positive for arthralgias, back pain and myalgias.  Neurological: Positive for headaches.    Last CBC Lab Results  Component Value Date   WBC 15.8 (H) 06/16/2019   HGB 13.4 06/16/2019   HCT 42.4 06/16/2019   MCV 93.0 06/16/2019   MCH 29.4 06/16/2019   RDW 14.1 06/16/2019   PLT 298 51/76/1607   Last metabolic panel Lab Results  Component Value Date   GLUCOSE 146 (H) 06/16/2019   NA 140 06/16/2019   K 3.8 06/16/2019  CL 103 06/16/2019   CO2 26 06/16/2019   BUN 20 06/16/2019   CREATININE 1.11 (H) 06/16/2019   GFRNONAA 48 (L) 06/16/2019   GFRAA 55 (L) 06/16/2019   CALCIUM 8.9 06/16/2019   PROT 6.8 06/16/2019   ALBUMIN 3.6 06/16/2019   LABGLOB 2.6 02/02/2018   AGRATIO 1.7 02/02/2018   BILITOT 0.4 06/16/2019   ALKPHOS 92 06/16/2019   AST 26 06/16/2019   ALT 26 06/16/2019   ANIONGAP 11 06/16/2019      Objective    There were no vitals taken for this visit. BP Readings from Last 3 Encounters:  11/02/19 (!) 147/69  10/18/19 118/70  09/07/19 (!) 171/81   Wt Readings from Last 3 Encounters:  11/02/19 198 lb (89.8 kg)  10/18/19 198 lb 2 oz (89.9 kg)  09/07/19 196 lb 6.4 oz (89.1 kg)        Assessment & Plan     1. Functional diarrhea Suspect secondary to cholecystectomy. Will try  colestipol as below. F/U in 3-4 weeks.  - colestipol (COLESTID) 1 g tablet; Take 1 tablet (1 g total) by mouth 2 (two) times daily.  Dispense: 60 tablet; Refill: 0  2. Intractable chronic migraine without aura and without status migrainosus Improving some since changing to gluten free diet.   3. Acute left-sided low back pain without sciatica New and worsening. Will try prednisone taper as below. F/U in 3-4 weeks,  - predniSONE (STERAPRED UNI-PAK 21 TAB) 10 MG (21) TBPK tablet; 6 day taper; take as package instructions  Dispense: 21 tablet; Refill: 0  4. Bilateral hand pain Hoping will respond to steroid as well. If not improving will get xrays.  - predniSONE (STERAPRED UNI-PAK 21 TAB) 10 MG (21) TBPK tablet; 6 day taper; take as package instructions  Dispense: 21 tablet; Refill: 0  5. Hypothyroidism due to acquired atrophy of thyroid Stable. Diagnosis pulled for medication refill. Continue current medical treatment plan. - levothyroxine (SYNTHROID) 112 MCG tablet; Take 1 tablet (112 mcg total) by mouth daily before breakfast.  Dispense: 90 tablet; Refill: 1  6. Itching On back. May improve from steroid. Will f/u in 3-4 weeks.    No follow-ups on file.    I discussed the assessment and treatment plan with the patient. The patient was provided an opportunity to ask questions and all were answered. The patient agreed with the plan and demonstrated an understanding of the instructions.   The patient was advised to call back or seek an in-person evaluation if the symptoms worsen or if the condition fails to improve as anticipated.  I provided 23 minutes of non-face-to-face time during this encounter.  Reynolds Bowl, PA-C, have reviewed all documentation for this visit. The documentation on 11/02/19 for the exam, diagnosis, procedures, and orders are all accurate and complete.  [facility] [phone] (phone) [phone] (fax)  [place]

## 2019-11-02 NOTE — Assessment & Plan Note (Signed)
Noticing improvements since cutting out gluten. Continue gluten free dieting. Call if worsening.

## 2019-11-02 NOTE — Telephone Encounter (Signed)
   Notes to clinic: Patient requests 90 days supply   Requested Prescriptions  Pending Prescriptions Disp Refills   colestipol (COLESTID) 1 g tablet [Pharmacy Med Name: COLESTIPOL 1GM TABLETS] 180 tablet     Sig: TAKE 1 TABLET(1 GRAM) BY MOUTH TWICE DAILY      Cardiovascular:  Antilipid - Bile Acid Sequestrants Failed - 11/02/2019  1:14 PM      Failed - LDL in normal range and within 360 days    LDL Chol Calc (NIH)  Date Value Ref Range Status  07/26/2019 111 (H) 0 - 99 mg/dL Final          Passed - Total Cholesterol in normal range and within 360 days    Cholesterol, Total  Date Value Ref Range Status  07/26/2019 181 100 - 199 mg/dL Final          Passed - HDL in normal range and within 360 days    HDL  Date Value Ref Range Status  07/26/2019 44 >39 mg/dL Final          Passed - Triglycerides in normal range and within 360 days    Triglycerides  Date Value Ref Range Status  07/26/2019 148 0 - 149 mg/dL Final          Passed - Valid encounter within last 12 months    Recent Outpatient Visits           Today Functional diarrhea   [place], Clearnce Sorrel, [place]   3 months ago Annual physical exam   [place], Clearnce Sorrel, [place]   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   [place], [place], [place]   1 year ago Abdominal distension (gaseous)   [place], Clearnce Sorrel, [place]   1 year ago Blepharitis of right upper eyelid, unspecified type   [facility], Clearnce Sorrel, [place]       Future Appointments             In 5 months Sharolyn Douglas, Clance Boll, NP [facility], [place]

## 2019-11-10 ENCOUNTER — Other Ambulatory Visit: Payer: Self-pay | Admitting: Physician Assistant

## 2019-11-10 ENCOUNTER — Other Ambulatory Visit: Payer: Self-pay | Admitting: Neurology

## 2019-11-10 DIAGNOSIS — N3281 Overactive bladder: Secondary | ICD-10-CM

## 2019-11-20 ENCOUNTER — Other Ambulatory Visit: Payer: Self-pay | Admitting: Physician Assistant

## 2019-11-20 DIAGNOSIS — F5101 Primary insomnia: Secondary | ICD-10-CM

## 2019-11-20 NOTE — Telephone Encounter (Signed)
Requested medication (s) are due for refill today: Yes  Requested medication (s) are on the active medication list: yes  Last refill: 05/22/19   #90  1 refill  Future visit scheduled   yes 07/25/20  Notes to clinic: not delegated  Requested Prescriptions  Pending Prescriptions Disp Refills   zolpidem (AMBIEN) 10 MG tablet [Pharmacy Med Name: ZOLPIDEM 10MG  TABLETS] 90 tablet     Sig: TAKE 1 TABLET BY MOUTH EVERY DAY AT BEDTIME      Not Delegated - Psychiatry:  Anxiolytics/Hypnotics Failed - 11/20/2019 11:11 PM      Failed - This refill cannot be delegated      Failed - Urine Drug Screen completed in last 360 days.      Passed - Valid encounter within last 6 months    Recent Outpatient Visits           2 weeks ago Functional diarrhea   [place], Clearnce Sorrel, [place]   3 months ago Annual physical exam   [place], Clearnce Sorrel, [place]   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   [place], Clearnce Sorrel, [place]   1 year ago Abdominal distension (gaseous)   [facility], Clearnce Sorrel, [place]   1 year ago Blepharitis of right upper eyelid, unspecified type   [place], PA-C       Future Appointments             In 4 months Sharolyn Douglas, Clance Boll, NP [facility], [place]

## 2019-11-21 ENCOUNTER — Other Ambulatory Visit: Payer: Self-pay | Admitting: Physician Assistant

## 2019-11-21 DIAGNOSIS — G588 Other specified mononeuropathies: Secondary | ICD-10-CM

## 2019-12-07 ENCOUNTER — Other Ambulatory Visit: Payer: Self-pay | Admitting: Neurology

## 2019-12-08 ENCOUNTER — Other Ambulatory Visit: Payer: Self-pay | Admitting: Physician Assistant

## 2019-12-08 DIAGNOSIS — K591 Functional diarrhea: Secondary | ICD-10-CM

## 2019-12-08 NOTE — Telephone Encounter (Signed)
Requested medication (s) are due for refill today:  Yes  Requested medication (s) are on the active medication list:  Yes  Future visit scheduled:  Yes  Last Refill: 11/02/19/ #60; no refills  Note to clinic:  pt. Was advised to f/u in 3-4 weeks for diarrhea  Requested Prescriptions  Pending Prescriptions Disp Refills   colestipol (COLESTID) 1 g tablet [Pharmacy Med Name: COLESTIPOL 1GM TABLETS] 60 tablet 0    Sig: TAKE 1 TABLET(1 GRAM) BY MOUTH TWICE DAILY      Cardiovascular:  Antilipid - Bile Acid Sequestrants Failed - 12/08/2019  6:09 PM      Failed - LDL in normal range and within 360 days    LDL Chol Calc (NIH)  Date Value Ref Range Status  07/26/2019 111 (H) 0 - 99 mg/dL Final          Passed - Total Cholesterol in normal range and within 360 days    Cholesterol, Total  Date Value Ref Range Status  07/26/2019 181 100 - 199 mg/dL Final          Passed - HDL in normal range and within 360 days    HDL  Date Value Ref Range Status  07/26/2019 44 >39 mg/dL Final          Passed - Triglycerides in normal range and within 360 days    Triglycerides  Date Value Ref Range Status  07/26/2019 148 0 - 149 mg/dL Final          Passed - Valid encounter within last 12 months    Recent Outpatient Visits           1 month ago Functional diarrhea   [place], Clearnce Sorrel, [place]   4 months ago Annual physical exam   [place], Clearnce Sorrel, [place]   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   [place], [place], [place]   1 year ago Abdominal distension (gaseous)   [place], [place], [place]   2 years ago Blepharitis of right upper eyelid, unspecified type   [facility], Clearnce Sorrel, [place]       Future Appointments             In 4 months Sharolyn Douglas, Clance Boll, NP [facility], [place]

## 2019-12-11 ENCOUNTER — Other Ambulatory Visit: Payer: Self-pay | Admitting: Physician Assistant

## 2019-12-11 DIAGNOSIS — N3281 Overactive bladder: Secondary | ICD-10-CM

## 2019-12-15 ENCOUNTER — Encounter: Payer: Self-pay | Admitting: Oncology

## 2019-12-15 ENCOUNTER — Other Ambulatory Visit: Payer: Self-pay

## 2019-12-15 ENCOUNTER — Inpatient Hospital Stay: Payer: Medicare PPO | Attending: Oncology

## 2019-12-15 ENCOUNTER — Inpatient Hospital Stay: Payer: Medicare PPO | Admitting: Oncology

## 2019-12-15 VITALS — BP 137/89 | HR 72 | Temp 98.2°F | Resp 16 | Ht 66.0 in | Wt 198.2 lb

## 2019-12-15 DIAGNOSIS — M818 Other osteoporosis without current pathological fracture: Secondary | ICD-10-CM | POA: Insufficient documentation

## 2019-12-15 DIAGNOSIS — E559 Vitamin D deficiency, unspecified: Secondary | ICD-10-CM | POA: Diagnosis not present

## 2019-12-15 DIAGNOSIS — I10 Essential (primary) hypertension: Secondary | ICD-10-CM | POA: Insufficient documentation

## 2019-12-15 DIAGNOSIS — D7219 Other eosinophilia: Secondary | ICD-10-CM | POA: Insufficient documentation

## 2019-12-15 DIAGNOSIS — E039 Hypothyroidism, unspecified: Secondary | ICD-10-CM | POA: Diagnosis not present

## 2019-12-15 DIAGNOSIS — C9110 Chronic lymphocytic leukemia of B-cell type not having achieved remission: Secondary | ICD-10-CM

## 2019-12-15 DIAGNOSIS — Z79899 Other long term (current) drug therapy: Secondary | ICD-10-CM | POA: Diagnosis not present

## 2019-12-15 LAB — COMPREHENSIVE METABOLIC PANEL
ALT: 31 U/L (ref 0–44)
AST: 28 U/L (ref 15–41)
Albumin: 3.8 g/dL (ref 3.5–5.0)
Alkaline Phosphatase: 91 U/L (ref 38–126)
Anion gap: 11 (ref 5–15)
BUN: 20 mg/dL (ref 8–23)
CO2: 28 mmol/L (ref 22–32)
Calcium: 8.8 mg/dL — ABNORMAL LOW (ref 8.9–10.3)
Chloride: 100 mmol/L (ref 98–111)
Creatinine, Ser: 1.29 mg/dL — ABNORMAL HIGH (ref 0.44–1.00)
GFR calc Af Amer: 46 mL/min — ABNORMAL LOW (ref >60)
GFR calc non Af Amer: 40 mL/min — ABNORMAL LOW (ref >60)
Glucose, Bld: 149 mg/dL — ABNORMAL HIGH (ref 70–99)
Potassium: 3.6 mmol/L (ref 3.5–5.1)
Sodium: 139 mmol/L (ref 135–145)
Total Bilirubin: 0.5 mg/dL (ref 0.3–1.2)
Total Protein: 6.7 g/dL (ref 6.5–8.1)

## 2019-12-15 LAB — CBC WITH DIFFERENTIAL/PLATELET
Abs Immature Granulocytes: 0.06 10*3/uL (ref 0.00–0.07)
Basophils Absolute: 0.1 10*3/uL (ref 0.0–0.1)
Basophils Relative: 1 %
Eosinophils Absolute: 1.2 10*3/uL — ABNORMAL HIGH (ref 0.0–0.5)
Eosinophils Relative: 8 %
HCT: 39.2 % (ref 36.0–46.0)
Hemoglobin: 13.2 g/dL (ref 12.0–15.0)
Immature Granulocytes: 0 %
Lymphocytes Relative: 61 %
Lymphs Abs: 9.2 10*3/uL — ABNORMAL HIGH (ref 0.7–4.0)
MCH: 29.6 pg (ref 26.0–34.0)
MCHC: 33.7 g/dL (ref 30.0–36.0)
MCV: 87.9 fL (ref 80.0–100.0)
Monocytes Absolute: 0.8 10*3/uL (ref 0.1–1.0)
Monocytes Relative: 5 %
Neutro Abs: 3.9 10*3/uL (ref 1.7–7.7)
Neutrophils Relative %: 25 %
Platelets: 253 10*3/uL (ref 150–400)
RBC: 4.46 MIL/uL (ref 3.87–5.11)
RDW: 14.5 % (ref 11.5–15.5)
Smear Review: NORMAL
WBC Morphology: ABNORMAL
WBC: 15.1 10*3/uL — ABNORMAL HIGH (ref 4.0–10.5)
nRBC: 0 % (ref 0.0–0.2)

## 2019-12-15 LAB — LACTATE DEHYDROGENASE: LDH: 186 U/L (ref 98–192)

## 2019-12-15 NOTE — Progress Notes (Addendum)
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 She has been sent to [place] for evaluation of leukocytosis. Over the last 1 year patient's white count has been around 12 with predominantly lymphocytosis and monocytosis as well as eosinophilia. No evidence of anemia or thrombocytopenia. Overall patient is doing well and denies any complaints of fevers, chills, unintentional weight loss, fatigue or night sweats. Denies any lumps or bumps anywhere  Further blood work from 07-2016 was as follows: CBC showed white count of 12.9 with an absolute lymphocyte count of 6.6 and an eosinophilia of 1.1. H&H was 13.4/41.6 and a platelet count of 270. CMP was unremarkable. Review of peripheral smear showed mild leukocytosis with absolute lymphocytosis and eosinophilia. BCR abl testing was negative for CML. Flow cytometry showed CD5 positive, CD23 positive clonal B-cell population CLL/SLL phenotype CD38 negative. 36% of leukocytes are less than 5000/mcL. eosinophilia  Interval history-patient reports chronic fatigue but denies other complaints at this time.  Appetite and weight have remained stable.  Denies any palpable lumps  ECOG PS- 1 Pain scale- 0  Review of systems-  Review of Systems  Constitutional: Positive for malaise/fatigue. Negative for chills, fever and weight loss.  HENT: Negative for congestion, ear discharge and nosebleeds.   Eyes: Negative for blurred vision.  Respiratory: Negative for cough, hemoptysis, sputum production, shortness of breath and wheezing.   Cardiovascular: Negative for chest pain, palpitations, orthopnea and claudication.  Gastrointestinal: Negative for abdominal pain, blood in stool, constipation, diarrhea, heartburn, melena, nausea and vomiting.  Genitourinary: Negative for dysuria, flank pain, frequency, hematuria and urgency.  Musculoskeletal: Negative for back pain, joint pain and myalgias.  Skin: Negative for rash.  Neurological: Negative for dizziness, tingling, focal weakness, seizures, weakness and headaches.  Endo/Heme/Allergies: Does not bruise/bleed easily.  Psychiatric/Behavioral: Negative for depression and suicidal ideas. The patient does not have insomnia.        Allergies  Allergen Reactions  . Shellfish Allergy     Throat closes, rash  . Latex Rash    Tapes,adhesives     Past Medical History:  Diagnosis Date  . Allergy    some  . Cataract    bilaterally both eyes   . Chest pain    a. 07/2019 MV: EF 77%, no ischemia/infarct.  . 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 Diastolic dysfunction    a. 08/2019 Echo: EF 55-60%, no rwma, Gr1 DD, nl RV size/fxn.  . Gallstones   . GERD (gastroesophageal reflux disease)   . Hydrocephalus ([place])    [facility]   . Hyperglycemia   . Hyperlipidemia   . Hypertension   . Lymphocytosis    monoclonal b cell  . Migraine, unspecified, without mention of intractable migraine without mention of status migrainosus 12/14/2012  . Osteoarthritis, chronic   . Osteoporosis   . Peptic ulcer  some bleeding with ulcers as well      Past Surgical History:  Procedure Laterality Date  . ABDOMINAL HYSTERECTOMY    . BREAST CYST ASPIRATION Left  1993   Removed  . breast cyst removed Right    benign   . BREAST LUMPECTOMY Bilateral   . COLONOSCOPY    . ERCP N/A 02/04/2018   Procedure: ENDOSCOPIC RETROGRADE CHOLANGIOPANCREATOGRAPHY (ERCP);  Surgeon: Lucilla Lame, MD;  Location: [facility] ENDOSCOPY;  Service: Endoscopy;  Laterality: N/A;  . ERCP N/A 02/28/2018   Procedure: ENDOSCOPIC RETROGRADE CHOLANGIOPANCREATOGRAPHY (ERCP) STENT REMOVAL;  Surgeon: Lucilla Lame, MD;  Location: [facility] ENDOSCOPY;  Service: Endoscopy;  Laterality: N/A;  . EYE SURGERY    . growth removal Left 2019   L wrist growth removed, abnormal cells  . History of Cataract surgery Right 2011   left 2011  . PARTIAL HYSTERECTOMY    . POLYPECTOMY    . ROBOTIC ASSISTED LAPAROSCOPIC CHOLECYSTECTOMY-MULTI SITE N/A 03/08/2018   Procedure: ROBOTIC ASSISTED LAPAROSCOPIC CHOLECYSTECTOMY-MULTI SITE;  Surgeon: Jules Husbands, MD;  Location: [facility] ORS;  Service: General;  Laterality: N/A;  . S/P ACL repair Left    knee  . TONSILLECTOMY    . TUBAL LIGATION  1975   Bilateral    Social History   Socioeconomic History  . Marital status: Divorced    Spouse name: Not on file  . Number of children: 1  . Years of education: college  . Highest education level: Not on file  Occupational History  . Occupation: book Production designer, theatre/television/film: OTHER    Comment: Paediatric nurse  Tobacco Use  . Smoking status: Never Smoker  . Smokeless tobacco: Never Used  Vaping Use  . Vaping Use: Never used  Substance and Sexual Activity  . Alcohol use: Yes    Alcohol/week: 1.0 standard drink    Types: 1 Glasses of wine per week    Comment: Rare- wine on occasion  . Drug use: No  . Sexual activity: Not Currently  Other Topics Concern  . Not on file  Social History Narrative   Patient is right handed, resides alone. Divorced.   Social Determinants of Health   Financial Resource Strain:   . Difficulty of Paying Living Expenses: Not on file  Food Insecurity:   . Worried About Ship broker in the Last Year: Not on file  . Ran Out of Food in the Last Year: Not on file  Transportation Needs:   . Lack of Transportation (Medical): Not on file  . Lack of Transportation (Non-Medical): Not on file  Physical Activity:   . Days of Exercise per Week: Not on file  . Minutes of Exercise per Session: Not on file  Stress:   . Feeling of Stress : Not on file  Social Connections:   . Frequency of Communication with Friends and Family: Not on file  . Frequency of Social Gatherings with Friends and Family: Not on file  . Attends Religious Services: Not on file  . Active Member of Clubs or Organizations: Not on file  . Attends Archivist Meetings: Not on file  . Marital Status: Not on file  Intimate Partner Violence:   . Fear of Current or Ex-Partner: Not on file  . Emotionally Abused: Not on file  . Physically Abused: Not on file  . Sexually Abused: Not on file    Family History  Problem Relation Age of Onset  . Colon cancer Mother   .  Hypertension Mother   . Colon cancer Father   . Heart disease Father   . Hypertension Father   . Colon polyps Neg Hx   . Rectal cancer Neg Hx   . Stomach cancer Neg Hx      Current Outpatient Medications:  .  ALPRAZolam (XANAX) 0.25 MG tablet, TAKE 2 TABLETS(0.5 MG) BY MOUTH AT BEDTIME, Disp: 180 tablet, Rfl: 1 .  aspirin 81 MG tablet, Take 81 mg by mouth daily., Disp: , Rfl:  .  carvedilol (COREG) 12.5 MG tablet, Take 1 tablet (12.5 mg total) by mouth 2 (two) times daily., Disp: 180 tablet, Rfl: 3 .  colestipol (COLESTID) 1 g tablet, TAKE 1 TABLET(1 GRAM) BY MOUTH TWICE DAILY, Disp: 60 tablet, Rfl: 0 .  cyclobenzaprine (FLEXERIL) 10 MG tablet, TAKE 1 TABLET(10 MG) BY MOUTH AT BEDTIME, Disp: 30 tablet, Rfl: 6 .  donepezil (ARICEPT) 10 MG tablet, Take 1 tablet (10 mg total) by mouth at bedtime. Please call [phone] to schedule follow up appt., Disp: 90 tablet, Rfl: 0 .  ezetimibe (ZETIA) 10 MG tablet, TAKE 1 TABLET BY MOUTH AT  BEDTIME, Disp: 90 tablet, Rfl: 2 .  gabapentin (NEURONTIN) 600 MG tablet, TAKE 1/2 TABLET BY MOUTH AT BREAKFAST, 1/2 AT LUNCH, AND 1 AT BEDTIME, Disp: 60 tablet, Rfl: 2 .  HYDROcodone-acetaminophen (NORCO/VICODIN) 5-325 MG tablet, Take 1 tablet by mouth every 6 (six) hours as needed for moderate pain., Disp: , Rfl:  .  levothyroxine (SYNTHROID) 112 MCG tablet, Take 1 tablet (112 mcg total) by mouth daily before breakfast., Disp: 90 tablet, Rfl: 1 .  losartan (COZAAR) 50 MG tablet, TAKE 1 TABLET BY MOUTH AT BEDTIME, Disp: 90 tablet, Rfl: 1 .  Magnesium 250 MG TABS, Take 1 tablet by mouth daily., Disp: , Rfl:  .  potassium chloride SA (K-DUR) 20 MEQ tablet, TAKE 1 TABLET BY MOUTH TWICE DAILY, Disp: 180 tablet, Rfl: 1 .  solifenacin (VESICARE) 10 MG tablet, TAKE 1 TABLET BY MOUTH EVERY DAY, Disp: 90 tablet, Rfl: 0 .  triamterene-hydrochlorothiazide (MAXZIDE) 75-50 MG tablet, TAKE 1/2 TABLET BY MOUTH DAILY, Disp: 90 tablet, Rfl: 1 .  verapamil (VERELAN PM) 120 MG 24 hr capsule, TAKE 1 CAPSULE BY MOUTH EVERY DAY, Disp: 60 capsule, Rfl: 0 .  zolpidem (AMBIEN) 10 MG tablet, TAKE 1 TABLET BY MOUTH EVERY DAY AT BEDTIME, Disp: 90 tablet, Rfl: 1  Physical exam:  Vitals:   12/15/19 1316  BP: 137/89  Pulse: 72  Resp: 16  Temp: 98.2 F (36.8 C)  TempSrc: Oral  Weight: 198 lb 3.2 oz (89.9 kg)  Height: 5\' 6"  (1.676 m)   Physical Exam Constitutional:      General: She is not in acute distress. Cardiovascular:     Rate and Rhythm: Normal rate and regular rhythm.     Heart sounds: Normal heart sounds.  Pulmonary:     Effort: Pulmonary effort is normal.     Breath sounds: Normal breath sounds.  Abdominal:     General: Bowel sounds are normal.     Palpations: Abdomen is soft.  Skin:    General: Skin is warm and dry.  Neurological:     Mental Status: She is alert and oriented to person, place, and time.      CMP Latest Ref Rng & Units 12/15/2019  Glucose 70 - 99 mg/dL 149(H)  BUN 8 - 23 mg/dL  20  Creatinine 0.44 - 1.00 mg/dL 1.29(H)  Sodium 135 - 145 mmol/L 139  Potassium  3.5 - 5.1 mmol/L 3.6  Chloride 98 - 111 mmol/L 100  CO2 22 - 32 mmol/L 28  Calcium 8.9 - 10.3 mg/dL 8.8(L)  Total Protein 6.5 - 8.1 g/dL 6.7  Total Bilirubin 0.3 - 1.2 mg/dL 0.5  Alkaline Phos 38 - 126 U/L 91  AST 15 - 41 U/L 28  ALT 0 - 44 U/L 31   CBC Latest Ref Rng & Units 12/15/2019  WBC 4.0 - 10.5 K/uL 15.1(H)  Hemoglobin 12.0 - 15.0 g/dL 13.2  Hematocrit 36 - 46 % 39.2  Platelets 150 - 400 K/uL 253     Assessment and plan- Patient is a 79 y.o. female with Rai stage 0 CLL here for routine follow-up   Clinically patient is doing well with no evidence of B symptoms or palpable adenopathy.  She has mild leukocytosis but her white cell count is remained Stable between 12-15 and her absolute lymphocyte count is 9 today.  She also has chronic eosinophilia which has also remained stable.  No anemia or thrombocytopenia.  She does not require any treatment for CLL at this time.  I will see her back in 6 months time   Visit Diagnosis 1. CLL (chronic lymphocytic leukemia) ([place])      Dr. Randa Evens, MD, MPH [facility] [phone] 12/15/2019 1:07 PM

## 2020-01-05 ENCOUNTER — Other Ambulatory Visit: Payer: Self-pay | Admitting: Physician Assistant

## 2020-01-05 DIAGNOSIS — E559 Vitamin D deficiency, unspecified: Secondary | ICD-10-CM

## 2020-01-05 NOTE — Telephone Encounter (Signed)
Requested medication (s) are due for refill today: Yes  Requested medication (s) are on the active medication list: Yes  Last refill:  07/27/19  Future visit scheduled: Yes  Notes to clinic:  See request.    Requested Prescriptions  Pending Prescriptions Disp Refills   D3-50 1.25 MG (50000 UT) capsule [Pharmacy Med Name: VITAMIN D3 50,000 IU (CHOLE) CAP] 12 capsule 1    Sig: TAKE ONE CAPSULE BY MOUTH EVERY WEEK AS DIRECTED      Endocrinology:  Vitamins - Vitamin D Supplementation Failed - 01/05/2020  8:17 AM      Failed - 50,000 IU strengths are not delegated      Failed - Ca in normal range and within 360 days    Calcium  Date Value Ref Range Status  12/15/2019 8.8 (L) 8.9 - 10.3 mg/dL Final   Calcium, Ion  Date Value Ref Range Status  05/06/2010 1.17 1.12 - 1.32 mmol/L Final          Failed - Phosphate in normal range and within 360 days    No results found for: PHOS        Failed - Vitamin D in normal range and within 360 days    Vit D, 25-Hydroxy  Date Value Ref Range Status  07/26/2019 26.8 (L) 30.0 - 100.0 ng/mL Final    Comment:    Vitamin D deficiency has been defined by the Institute of Medicine and an Endocrine Society practice guideline as a level of serum 25-OH vitamin D less than 20 ng/mL (1,2). The Endocrine Society went on to further define vitamin D insufficiency as a level between 21 and 29 ng/mL (2). 1. IOM (Institute of Medicine). 2010. Dietary reference    intakes for calcium and D. [place]: The    [employer]. 2. Holick MF, Binkley Donald, Bischoff-Ferrari HA, et al.    Evaluation, treatment, and prevention of vitamin D    deficiency: an Endocrine Society clinical practice    guideline. JCEM. 2011 Jul; 96(7):1911-30.           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

## 2020-01-11 ENCOUNTER — Other Ambulatory Visit: Payer: Self-pay | Admitting: Neurology

## 2020-01-11 ENCOUNTER — Other Ambulatory Visit: Payer: Self-pay | Admitting: Physician Assistant

## 2020-01-11 DIAGNOSIS — K591 Functional diarrhea: Secondary | ICD-10-CM

## 2020-01-11 NOTE — Telephone Encounter (Signed)
Requested medication (s) are due for refill today - yes- if to continue  Requested medication (s) are on the active medication list -yes  Future visit scheduled -no  Last refill: 12/11/19  Notes to clinic: Request RF of medication written with no RF on original Rx  Requested Prescriptions  Pending Prescriptions Disp Refills   colestipol (COLESTID) 1 g tablet [Pharmacy Med Name: COLESTIPOL 1GM TABLETS] 60 tablet 0    Sig: TAKE 1 TABLET(1 GRAM) BY MOUTH TWICE DAILY      Cardiovascular:  Antilipid - Bile Acid Sequestrants Failed - 01/11/2020 11:10 AM      Failed - LDL in normal range and within 360 days    LDL Chol Calc (NIH)  Date Value Ref Range Status  07/26/2019 111 (H) 0 - 99 mg/dL Final          Passed - Total Cholesterol in normal range and within 360 days    Cholesterol, Total  Date Value Ref Range Status  07/26/2019 181 100 - 199 mg/dL Final          Passed - HDL in normal range and within 360 days    HDL  Date Value Ref Range Status  07/26/2019 44 >39 mg/dL Final          Passed - Triglycerides in normal range and within 360 days    Triglycerides  Date Value Ref Range Status  07/26/2019 148 0 - 149 mg/dL Final          Passed - Valid encounter within last 12 months    Recent Outpatient Visits           2 months ago Functional diarrhea   [facility] [place], [place], PA-C   5 months ago Annual physical exam   [place], [place], [place]   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   [place], [place], [place]   2 years ago Abdominal distension (gaseous)   [place], [place], [place]   2 years ago Blepharitis of right upper eyelid, unspecified type   [place], Clearnce Sorrel, [place]       Future Appointments             In 3 months Sharolyn Douglas, Clance Boll, NP [place], [facility]                Requested  Prescriptions  Pending Prescriptions Disp Refills   colestipol (COLESTID) 1 g tablet [Pharmacy Med Name: COLESTIPOL 1GM TABLETS] 60 tablet 0    Sig: TAKE 1 TABLET(1 GRAM) BY MOUTH TWICE DAILY      Cardiovascular:  Antilipid - Bile Acid Sequestrants Failed - 01/11/2020 11:10 AM      Failed - LDL in normal range and within 360 days    LDL Chol Calc (NIH)  Date Value Ref Range Status  07/26/2019 111 (H) 0 - 99 mg/dL Final          Passed - Total Cholesterol in normal range and within 360 days    Cholesterol, Total  Date Value Ref Range Status  07/26/2019 181 100 - 199 mg/dL Final          Passed - HDL in normal range and within 360 days    HDL  Date Value Ref Range Status  07/26/2019 44 >39 mg/dL Final          Passed - Triglycerides in normal range and within 360 days    Triglycerides  Date Value Ref Range Status  07/26/2019 148 0 - 149 mg/dL Final          Passed - Valid encounter within last 12 months    Recent Outpatient Visits           2 months ago Functional diarrhea   [facility] [place], Clearnce Sorrel, [place]   5 months ago Annual physical exam   [place], Clearnce Sorrel, [place]   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

## 2020-01-11 NOTE — Telephone Encounter (Signed)
Left message with daughter Pia Mau (listed on DPR) to return call.  Patient needs follow up appointment for future refills.  Last visit was 08/2018, virtual visit with Dr. Jaynee Eagles.

## 2020-01-16 ENCOUNTER — Ambulatory Visit: Payer: Medicare Other | Admitting: Neurology

## 2020-02-08 ENCOUNTER — Other Ambulatory Visit: Payer: Self-pay | Admitting: Physician Assistant

## 2020-02-08 ENCOUNTER — Other Ambulatory Visit: Payer: Self-pay | Admitting: Neurology

## 2020-02-08 DIAGNOSIS — G4700 Insomnia, unspecified: Secondary | ICD-10-CM

## 2020-02-08 NOTE — Telephone Encounter (Signed)
Requested medication (s) are due for refill today - yes  Requested medication (s) are on the active medication list -yes  Future visit scheduled -no  Last refill: 08/10/19 #180 1 RF  Notes to clinic: Request non delegated Rx  Requested Prescriptions  Pending Prescriptions Disp Refills   ALPRAZolam (XANAX) 0.25 MG tablet [Pharmacy Med Name: ALPRAZOLAM 0.25MG  TABLETS] 180 tablet     Sig: TAKE 2 TABLETS(0.5 MG) BY MOUTH AT BEDTIME      Not Delegated - Psychiatry:  Anxiolytics/Hypnotics Failed - 02/08/2020  9:42 AM      Failed - This refill cannot be delegated      Failed - Urine Drug Screen completed in last 360 days.      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   [place], [place], [place]   2 years ago Abdominal distension (gaseous)   [facility], [place], [place]   2 years ago Blepharitis of right upper eyelid, unspecified type   [facility], Clearnce Sorrel, PA-C       Future Appointments             In 2 months Sharolyn Douglas, Clance Boll, NP [facility], [facility]                Requested Prescriptions  Pending Prescriptions Disp Refills   ALPRAZolam (XANAX) 0.25 MG tablet [Pharmacy Med Name: ALPRAZOLAM 0.25MG  TABLETS] 180 tablet     Sig: TAKE 2 TABLETS(0.5 MG) BY MOUTH AT BEDTIME      Not Delegated - Psychiatry:  Anxiolytics/Hypnotics Failed - 02/08/2020  9:42 AM      Failed - This refill cannot be delegated      Failed - Urine Drug Screen completed in last 360 days.      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

## 2020-02-11 ENCOUNTER — Other Ambulatory Visit: Payer: Self-pay | Admitting: Physician Assistant

## 2020-02-11 DIAGNOSIS — G588 Other specified mononeuropathies: Secondary | ICD-10-CM

## 2020-02-11 NOTE — Telephone Encounter (Signed)
Requested Prescriptions  Pending Prescriptions Disp Refills   gabapentin (NEURONTIN) 600 MG tablet [Pharmacy Med Name: GABAPENTIN 600MG  TABLETS] 60 tablet 2    Sig: TAKE 1/2 TABLET BY MOUTH AT BREAKFAST, 1/2 AT LUNCH, AND 1 AT BEDTIME     Neurology: Anticonvulsants - gabapentin Passed - 02/11/2020  3:28 AM      Passed - Valid encounter within last 12 months    Recent Outpatient Visits          3 months ago Functional diarrhea   [facility] [place] 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

## 2020-03-04 ENCOUNTER — Other Ambulatory Visit: Payer: Self-pay | Admitting: Neurology

## 2020-03-15 ENCOUNTER — Other Ambulatory Visit: Payer: Self-pay | Admitting: Physician Assistant

## 2020-03-15 DIAGNOSIS — N3281 Overactive bladder: Secondary | ICD-10-CM

## 2020-03-15 DIAGNOSIS — I10 Essential (primary) hypertension: Secondary | ICD-10-CM

## 2020-03-24 ENCOUNTER — Other Ambulatory Visit: Payer: Self-pay | Admitting: Neurology

## 2020-04-10 ENCOUNTER — Encounter: Payer: Self-pay | Admitting: Nurse Practitioner

## 2020-04-10 ENCOUNTER — Ambulatory Visit: Payer: Medicare PPO | Admitting: Nurse Practitioner

## 2020-04-10 ENCOUNTER — Other Ambulatory Visit: Payer: Self-pay

## 2020-04-10 VITALS — BP 130/70 | HR 68 | Ht 66.0 in | Wt 195.0 lb

## 2020-04-10 DIAGNOSIS — I10 Essential (primary) hypertension: Secondary | ICD-10-CM

## 2020-04-10 DIAGNOSIS — E782 Mixed hyperlipidemia: Secondary | ICD-10-CM | POA: Diagnosis not present

## 2020-04-10 DIAGNOSIS — R0789 Other chest pain: Secondary | ICD-10-CM

## 2020-04-10 NOTE — Progress Notes (Signed)
Office Visit    Patient Name: Cathy Barnes Date of Encounter: 04/10/2020  Primary Care Provider:  Mar Daring, PA-C Primary Cardiologist:  Kathlyn Sacramento, MD  Chief Complaint    79 year old female with a history of hypertension, hyperlipidemia, borderline diabetes, hypothyroidism, and chronic lymphocytic leukemia, presents for follow-up related to dyspnea.  Past Medical History    Past Medical History:  Diagnosis Date  . Allergy    some  . Cataract    bilaterally both eyes   . Chest pain    a. 07/2019 MV: EF 77%, no ischemia/infarct.  . 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 Diastolic dysfunction    a. 08/2019 Echo: EF 55-60%, no rwma, Gr1 DD, nl RV size/fxn.  . Gallstones   . GERD (gastroesophageal reflux disease)   . Hydrocephalus ([place])    [facility]   . Hyperglycemia   . Hyperlipidemia   . Hypertension   . Lymphocytosis    monoclonal b cell  . Migraine, unspecified, without mention of intractable migraine without mention of status migrainosus 12/14/2012  . Osteoarthritis, chronic   . Osteoporosis   . Peptic ulcer    some bleeding with ulcers as well    Past Surgical History:  Procedure Laterality Date  . ABDOMINAL HYSTERECTOMY    . BREAST CYST ASPIRATION Left 1993   Removed  . breast cyst removed Right    benign   . BREAST LUMPECTOMY Bilateral   . COLONOSCOPY    . ERCP N/A 02/04/2018   Procedure: ENDOSCOPIC RETROGRADE CHOLANGIOPANCREATOGRAPHY (ERCP);  Surgeon: Lucilla Lame, MD;  Location: [facility] ENDOSCOPY;  Service: Endoscopy;  Laterality: N/A;  . ERCP N/A 02/28/2018   Procedure: ENDOSCOPIC RETROGRADE CHOLANGIOPANCREATOGRAPHY (ERCP) STENT REMOVAL;  Surgeon: Lucilla Lame, MD;  Location: [facility] ENDOSCOPY;  Service: Endoscopy;  Laterality: N/A;  . EYE SURGERY    . growth removal Left 2019   L wrist growth removed, abnormal cells  . History of Cataract surgery Right 2011   left 2011  . PARTIAL HYSTERECTOMY    . POLYPECTOMY    .  ROBOTIC ASSISTED LAPAROSCOPIC CHOLECYSTECTOMY-MULTI SITE N/A 03/08/2018   Procedure: ROBOTIC ASSISTED LAPAROSCOPIC CHOLECYSTECTOMY-MULTI SITE;  Surgeon: Jules Husbands, MD;  Location: [facility] ORS;  Service: General;  Laterality: N/A;  . S/P ACL repair Left    knee  . TONSILLECTOMY    . TUBAL LIGATION  1975   Bilateral    Allergies  Allergies  Allergen Reactions  . Shellfish Allergy     Throat closes, rash  . Latex Rash    Tapes,adhesives    History of Present Illness    79 year old female with the above past medical history including hypertension, hyperlipidemia, borderline diabetes, hypothyroidism, and chronic lymphocytic leukemia.  She was previous evaluated by cardiology more than 15 years ago in the setting of chest pain and reportedly underwent stress testing and echocardiography which showed no significant abnormalities.  In April 2021, she was seen by Dr. Fletcher Anon in the setting of dyspnea occurring with minimal activity as well as occasional right-sided chest squeezing and discomfort with exertion.  ECG was unremarkable.  She subsequently underwent stress testing which showed no ischemia or infarct.  Echocardiography showed normal LV function with grade 1 diastolic dysfunction.  No significant valvular abnormalities were noted.  She was last seen in cardiology clinic in June 2021, at which time she was feeling well and blood pressures had improved, as had intermittent orthostatic lightheadedness.  Since her last visit,  she has done well.  Blood pressures continue to trend in the 120-130 range at home.  She remains active around her home but is not routinely exercising.  She denies chest pain, dyspnea, palpitations, PND, orthopnea, dizziness, syncope, edema, or early satiety.  She has received both of her Covid vaccines and booster and is planning to keep Christmas small this year with just her daughter and grandson.  Home Medications    Prior to Admission medications   Medication Sig  Start Date End Date Taking? Authorizing Provider  ALPRAZolam (XANAX) 0.25 MG tablet Take 1-2 tablets (0.25-0.5 mg total) by mouth at bedtime as needed for anxiety. 02/08/20   Mar Daring, PA-C  aspirin 81 MG tablet Take 81 mg by mouth daily.    [provider]  carvedilol (COREG) 12.5 MG tablet Take 1 tablet (12.5 mg total) by mouth 2 (two) times daily. 09/07/19   Theora Gianotti, NP  colestipol (COLESTID) 1 g tablet TAKE 1 TABLET(1 GRAM) BY MOUTH TWICE DAILY 01/12/20   Mar Daring, PA-C  cyclobenzaprine (FLEXERIL) 10 MG tablet TAKE 1 TABLET(10 MG) BY MOUTH AT BEDTIME 03/25/20   [name], MD  D3-50 1.25 MG (50000 UT) capsule TAKE ONE CAPSULE BY MOUTH EVERY WEEK AS DIRECTED 01/05/20   Mar Daring, PA-C  donepezil (ARICEPT) 10 MG tablet TAKE 1 TABLET(10 MG) BY MOUTH AT BEDTIME. FOLLOW UP APPOINTMENT 03/04/20   [name], MD  ezetimibe (ZETIA) 10 MG tablet TAKE 1 TABLET BY MOUTH AT BEDTIME 10/06/19   Mar Daring, PA-C  gabapentin (NEURONTIN) 600 MG tablet TAKE 1/2 TABLET BY MOUTH AT BREAKFAST, 1/2 AT LUNCH, AND 1 AT BEDTIME 02/11/20   Mar Daring, PA-C  HYDROcodone-acetaminophen (NORCO/VICODIN) 5-325 MG tablet Take 1 tablet by mouth every 6 (six) hours as needed for moderate pain.    [provider]  levothyroxine (SYNTHROID) 112 MCG tablet Take 1 tablet (112 mcg total) by mouth daily before breakfast. 11/02/19   Mar Daring, PA-C  losartan (COZAAR) 50 MG tablet TAKE 1 TABLET BY MOUTH AT BEDTIME 03/15/20   Mar Daring, PA-C  Magnesium 250 MG TABS Take 1 tablet by mouth daily.    [provider]  potassium chloride SA (K-DUR) 20 MEQ tablet TAKE 1 TABLET BY MOUTH TWICE DAILY 01/02/19   Mar Daring, PA-C  solifenacin (VESICARE) 10 MG tablet TAKE 1 TABLET BY MOUTH EVERY DAY 03/15/20   Mar Daring, PA-C  triamterene-hydrochlorothiazide (MAXZIDE) 75-50 MG tablet TAKE 1/2 TABLET BY  MOUTH DAILY 10/27/19   Mar Daring, PA-C  verapamil (VERELAN PM) 120 MG 24 hr capsule TAKE 1 CAPSULE BY MOUTH EVERY DAY. MUST HAVE APPOINTMENT FOR FURTHER REFILLS 02/08/20   [name], MD  zolpidem (AMBIEN) 10 MG tablet TAKE 1 TABLET BY MOUTH EVERY DAY AT BEDTIME 11/21/19   Mar Daring, PA-C    Review of Systems    Doing well since her last visit.  She denies chest pain, palpitations, dyspnea, pnd, orthopnea, n, v, dizziness, syncope, edema, weight gain, or early satiety.  All other systems reviewed and are otherwise negative except as noted above.  Physical Exam    VS:  BP 130/70 (BP Location: Left Arm, Patient Position: Sitting, Cuff Size: Large)   Pulse 68   Ht 5\' 6"  (1.676 m)   Wt 195 lb (88.5 kg)   SpO2 96%   BMI 31.47 kg/m  , BMI Body mass index is 31.47 kg/m. GEN:  Well nourished, well developed, in no acute distress. HEENT: normal. Neck: Supple, no JVD, carotid bruits, or masses. Cardiac: RRR, no murmurs, rubs, or gallops. No clubbing, cyanosis, edema.  Radials 2+ /PT 1+ and equal bilaterally.  Respiratory:  Respirations regular and unlabored, clear to auscultation bilaterally. GI: Soft, nontender, nondistended, BS + x 4. MS: no deformity or atrophy. Skin: warm and dry, no rash. Neuro:  Strength and sensation are intact. Psych: Normal affect.  Accessory Clinical Findings    ECG personally reviewed by me today -regular sinus rhythm, 68, delayed R wave progression.  Lab Results  Component Value Date   WBC 15.1 (H) 12/15/2019   HGB 13.2 12/15/2019   HCT 39.2 12/15/2019   MCV 87.9 12/15/2019   PLT 253 12/15/2019   Lab Results  Component Value Date   CREATININE 1.29 (H) 12/15/2019   BUN 20 12/15/2019   NA 139 12/15/2019   K 3.6 12/15/2019   CL 100 12/15/2019   CO2 28 12/15/2019   Lab Results  Component Value Date   ALT 31 12/15/2019   AST 28 12/15/2019   ALKPHOS 91 12/15/2019   BILITOT 0.5 12/15/2019   Lab Results  Component Value Date    CHOL 181 07/26/2019   HDL 44 07/26/2019   LDLCALC 111 (H) 07/26/2019   TRIG 148 07/26/2019   CHOLHDL 3.9 09/23/2017    Lab Results  Component Value Date   HGBA1C 6.2 (H) 07/26/2019    Assessment & Plan    1.  Atypical chest pain: She has a history of right-sided discomfort that has since resolved.  Evaluation earlier this year including echocardiogram and stress testing were reassuring.  She has not been having any recurrence of chest discomfort.  No further evaluation warranted at this time.  2.  Essential hypertension: Stable on carvedilol, losartan, verapamil, and Maxide.  Relatively stable labs in August.  3.  Dyspnea on exertion: No complaints today.  Echo previously with normal LV function and stress testing was nonischemic.  4.  Hyperlipidemia: She remains on Zetia therapy with an LDL of 111 in April.  LFTs were normal in August.  5.  Disposition: Follow-up in 1 year or sooner if necessary.   Murray Hodgkins, NP 04/10/2020, 3:23 PM

## 2020-04-10 NOTE — Patient Instructions (Addendum)
Medication Instructions:  No changes  *If you need a refill on your cardiac medications before your next appointment, please call your pharmacy*   Lab Work: None  If you have labs (blood work) drawn today and your tests are completely normal, you will receive your results only by:  [place] (if you have MyChart) OR  A paper copy in the mail If you have any lab test that is abnormal or we need to change your treatment, we will call you to review the results.   Testing/Procedures: None   Follow-Up: At [facility], you and your health needs are our priority.  As part of our continuing mission to provide you with exceptional heart care, we have created designated Provider Care Teams.  These Care Teams include your primary Cardiologist (physician) and Advanced Practice Providers (APPs -  Physician Assistants and Nurse Practitioners) who all work together to provide you with the care you need, when you need it.   Your next appointment:   1 year(s)  The format for your next appointment:   In Person  Provider:   Kathlyn Sacramento, MD or Murray Hodgkins, NP

## 2020-04-29 ENCOUNTER — Telehealth: Payer: Self-pay

## 2020-04-29 DIAGNOSIS — E034 Atrophy of thyroid (acquired): Secondary | ICD-10-CM

## 2020-04-29 NOTE — Telephone Encounter (Signed)
[facility] faxed refill request for the following medications:  levothyroxine (SYNTHROID) 112 MCG tablet  Please advise.  Thanks, TGH  

## 2020-04-30 MED ORDER — LEVOTHYROXINE SODIUM 112 MCG PO TABS: 112.0000 ug | ORAL_TABLET | Freq: Every day | ORAL | 1 refills | Status: DC

## 2020-05-05 ENCOUNTER — Other Ambulatory Visit: Payer: Self-pay | Admitting: Physician Assistant

## 2020-05-05 DIAGNOSIS — G588 Other specified mononeuropathies: Secondary | ICD-10-CM

## 2020-05-17 ENCOUNTER — Other Ambulatory Visit: Payer: Self-pay | Admitting: Physician Assistant

## 2020-05-17 DIAGNOSIS — F5101 Primary insomnia: Secondary | ICD-10-CM

## 2020-05-17 DIAGNOSIS — E559 Vitamin D deficiency, unspecified: Secondary | ICD-10-CM

## 2020-05-17 NOTE — Telephone Encounter (Signed)
Approved per protocol. Most recent labs 07/2019.

## 2020-05-18 NOTE — Telephone Encounter (Signed)
Requested medication (s) are due for refill today: yes  Requested medication (s) are on the active medication list: yes  Last refill:  11/21/19  Future visit scheduled: no  Notes to clinic:  med not delegated to NT to RF   Requested Prescriptions  Pending Prescriptions Disp Refills   zolpidem (AMBIEN) 10 MG tablet [Pharmacy Med Name: ZOLPIDEM 10MG  TABLETS] 90 tablet     Sig: TAKE 1 TABLET BY MOUTH EVERY DAY AT BEDTIME      Not Delegated - Psychiatry:  Anxiolytics/Hypnotics Failed - 05/17/2020  9:56 PM      Failed - This refill cannot be delegated      Failed - Urine Drug Screen completed in last 360 days      Failed - Valid encounter within last 6 months    Recent Outpatient Visits           6 months ago Functional diarrhea   [facility] Mar Daring, [place]   9 months ago Annual physical exam   [place], Clearnce Sorrel, [place]   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

## 2020-05-20 ENCOUNTER — Other Ambulatory Visit: Payer: Self-pay | Admitting: Physician Assistant

## 2020-05-20 DIAGNOSIS — E559 Vitamin D deficiency, unspecified: Secondary | ICD-10-CM

## 2020-05-20 DIAGNOSIS — F5101 Primary insomnia: Secondary | ICD-10-CM

## 2020-05-20 MED ORDER — ZOLPIDEM TARTRATE 10 MG PO TABS: 10.0000 mg | ORAL_TABLET | Freq: Every day | ORAL | 1 refills | Status: DC

## 2020-05-20 MED ORDER — VITAMIN D3 1.25 MG (50000 UT) PO CAPS: 1.0000 | ORAL_CAPSULE | ORAL | 3 refills | Status: DC

## 2020-05-20 NOTE — Telephone Encounter (Signed)
Requested medication (s) are due for refill today - Ambien- yes, Vit D- no  Requested medication (s) are on the active medication list -yes  Future visit scheduled -no  Last refill: Ambien 12/19/19 #90 1RF                 Vit D 05/17/20 #12  Notes to clinic: Request non delegated Rx  Requested Prescriptions  Pending Prescriptions Disp Refills   zolpidem (AMBIEN) 10 MG tablet 90 tablet 1    Sig: Take 1 tablet (10 mg total) by mouth at bedtime.      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   [facility] Fenton Malling M, [place]   2 years ago Abdominal distension (gaseous)   [facility], [place], [place]   2 years ago Blepharitis of right upper eyelid, unspecified type   [facility], [place] Malta M, [place]                  Cholecalciferol (VITAMIN D3) 1.25 MG (50000 UT) CAPS 12 capsule 0      Endocrinology:  Vitamins - Vitamin D Supplementation Failed - 05/20/2020 10:40 AM      Failed - 50,000 IU strengths are not delegated      Failed - Ca in normal range and within 360 days    Calcium  Date Value Ref Range Status  12/15/2019 8.8 (L) 8.9 - 10.3 mg/dL Final   Calcium, Ion  Date Value Ref Range Status  05/06/2010 1.17 1.12 - 1.32 mmol/L Final          Failed - Phosphate in normal range and within 360 days    No results found for: PHOS        Failed - Vitamin D in normal range and within 360 days    Vit D, 25-Hydroxy  Date Value Ref Range Status  07/26/2019  26.8 (L) 30.0 - 100.0 ng/mL Final    Comment:    Vitamin D deficiency has been defined by the Institute of Medicine and an Endocrine Society practice guideline as a level of serum 25-OH vitamin D less than 20 ng/mL (1,2). The Endocrine Society went on to further define vitamin D insufficiency as a level between 21 and 29 ng/mL (2). 1. IOM (Institute of Medicine). 2010. Dietary reference    intakes for calcium and D. [place]: The    [employer]. 2. Holick MF, Binkley [place], Bischoff-Ferrari HA, et al.    Evaluation, treatment, and prevention of vitamin D    deficiency: an Endocrine Society clinical practice    guideline. JCEM. 2011 Jul; 96(7):1911-30.           Passed - Valid encounter within last 12 months    Recent Outpatient Visits           6 months ago Functional diarrhea   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   [facility] Fenton Malling M, [place]   2 years ago Abdominal distension (gaseous)   [facility], [place], [place]   2 years ago Blepharitis of right upper eyelid, unspecified type   [facility], Anderson Malta M, [place]                  Cholecalciferol (VITAMIN D3) 1.25 MG (50000 UT) CAPS 12 capsule 0      Endocrinology:  Vitamins - Vitamin D Supplementation Failed - 05/20/2020 10:40 AM      Failed - 50,000 IU strengths are not delegated      Failed - Ca in normal range and within 360 days    Calcium  Date Value Ref Range Status  12/15/2019 8.8 (L) 8.9 - 10.3 mg/dL Final   Calcium, Ion  Date Value Ref Range Status  05/06/2010 1.17 1.12 - 1.32 mmol/L Final          Failed - Phosphate in normal range and within 360 days    No results found for: PHOS        Failed - Vitamin D in normal range and within 360 days    Vit D, 25-Hydroxy  Date Value Ref Range Status  07/26/2019 26.8 (L) 30.0 - 100.0 ng/mL Final    Comment:    Vitamin D deficiency has been defined by the Institute of Medicine and an Endocrine Society practice guideline as a level of serum 25-OH vitamin D less than 20 ng/mL (1,2). The Endocrine Society went on to further define vitamin D insufficiency as a level between 21 and 29 ng/mL (2). 1. IOM (Institute of Medicine). 2010. Dietary reference    intakes for calcium and D. [place]: The    [employer]. 2. Holick MF, Binkley [place], Bischoff-Ferrari HA, et al.    Evaluation, treatment, and prevention of vitamin D    deficiency: an Endocrine Society clinical practice    guideline. JCEM. 2011 Jul; 96(7):1911-30.           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

## 2020-05-20 NOTE — Telephone Encounter (Signed)
Medication: zolpidem (AMBIEN) 10 MG tablet [[id number]] , Vitamin D, Ergocalciferol, (DRISDOL) 50000 UNITS CAPS capsule [[id number]]  DISCONTINUED  Has the patient contacted their pharmacy? YES  (Agent: If no, request that the patient contact the pharmacy for the refill.) (Agent: If yes, when and what did the pharmacy advise?)  Preferred Pharmacy (with phone number or street name): [id number] - [place], [place] [facility] [place] [address] [place] [address] Phone: [phone] Fax: [phone] Hours: Not open 24 hours    Agent: Please be advised that RX refills may take up to 3 business days. We ask that you follow-up with your pharmacy.

## 2020-05-22 ENCOUNTER — Other Ambulatory Visit: Payer: Self-pay | Admitting: Neurology

## 2020-06-10 ENCOUNTER — Other Ambulatory Visit: Payer: Self-pay | Admitting: Neurology

## 2020-06-10 NOTE — Telephone Encounter (Signed)
Tried to call daughter Helene Kelp but VM was full. I sent in a 30 day supply only. Patient needs to be seen for further refills.

## 2020-06-13 ENCOUNTER — Telehealth: Payer: Self-pay | Admitting: Oncology

## 2020-06-13 NOTE — Telephone Encounter (Signed)
Called pt and let her know that March 14 is just labs and then in August she will have labs and see md. I printed a new appt. Schedule and put it in the mail for pt. She wrote down all the appts already but a god reminder to get it in the mail she said

## 2020-06-17 ENCOUNTER — Other Ambulatory Visit: Payer: Medicare PPO

## 2020-06-17 ENCOUNTER — Other Ambulatory Visit: Payer: Self-pay | Admitting: Physician Assistant

## 2020-06-17 DIAGNOSIS — N3281 Overactive bladder: Secondary | ICD-10-CM

## 2020-06-18 ENCOUNTER — Other Ambulatory Visit: Payer: Medicare PPO

## 2020-06-18 ENCOUNTER — Ambulatory Visit: Payer: Medicare PPO | Admitting: Oncology

## 2020-06-26 ENCOUNTER — Encounter: Payer: Self-pay | Admitting: Physician Assistant

## 2020-06-26 ENCOUNTER — Other Ambulatory Visit: Payer: Self-pay

## 2020-06-26 ENCOUNTER — Ambulatory Visit
Admission: RE | Admit: 2020-06-26 | Discharge: 2020-06-26 | Disposition: A | Discharge status: Home / Self Care | Payer: Medicare PPO | Source: Ambulatory Visit | Attending: Physician Assistant | Admitting: Physician Assistant

## 2020-06-26 ENCOUNTER — Ambulatory Visit: Payer: Medicare PPO | Admitting: Physician Assistant

## 2020-06-26 VITALS — BP 126/83 | HR 84 | Temp 98.4°F | Wt 191.0 lb

## 2020-06-26 DIAGNOSIS — G8929 Other chronic pain: Secondary | ICD-10-CM | POA: Insufficient documentation

## 2020-06-26 DIAGNOSIS — M545 Low back pain, unspecified: Secondary | ICD-10-CM

## 2020-06-26 DIAGNOSIS — E034 Atrophy of thyroid (acquired): Secondary | ICD-10-CM

## 2020-06-26 DIAGNOSIS — M47814 Spondylosis without myelopathy or radiculopathy, thoracic region: Secondary | ICD-10-CM | POA: Diagnosis not present

## 2020-06-26 DIAGNOSIS — E7800 Pure hypercholesterolemia, unspecified: Secondary | ICD-10-CM

## 2020-06-26 DIAGNOSIS — M546 Pain in thoracic spine: Secondary | ICD-10-CM | POA: Diagnosis not present

## 2020-06-26 DIAGNOSIS — G588 Other specified mononeuropathies: Secondary | ICD-10-CM | POA: Insufficient documentation

## 2020-06-26 DIAGNOSIS — R1011 Right upper quadrant pain: Secondary | ICD-10-CM | POA: Insufficient documentation

## 2020-06-26 DIAGNOSIS — R0781 Pleurodynia: Secondary | ICD-10-CM | POA: Diagnosis not present

## 2020-06-26 DIAGNOSIS — M8588 Other specified disorders of bone density and structure, other site: Secondary | ICD-10-CM | POA: Diagnosis not present

## 2020-06-26 MED ORDER — HYDROCODONE-ACETAMINOPHEN 5-325 MG PO TABS: 1.0000 | ORAL_TABLET | Freq: Four times a day (QID) | ORAL | 0 refills | Status: DC | PRN

## 2020-06-26 MED ORDER — EZETIMIBE 10 MG PO TABS
10.0000 mg | ORAL_TABLET | Freq: Every day | ORAL | 2 refills | Status: DC
Start: 2020-06-26 — End: 2021-03-19

## 2020-06-26 NOTE — Progress Notes (Signed)
Established patient visit   [name]   DOB: 1940/06/08   80 y.o. Female  MRN: [id number] Visit Date: 06/26/2020  Today's healthcare provider: Mar Daring, PA-C   No chief complaint on file.  Subjective    Back Pain This is a new problem. The current episode started more than 1 month ago. The problem occurs daily. The problem has been gradually worsening since onset. The pain is present in the lumbar spine. Quality: Throbbing. The pain does not radiate. Exacerbated by: Walking. Associated symptoms include abdominal pain. Pertinent negatives include no headaches or weakness. She has tried heat for the symptoms. The treatment provided mild relief.   Right upper quadrant pain  Pt reports this is an ongoing issue but has worsened in the last month. This has been present x 3 years. She had gallbladder removal, but this did not improve pain. Pain is present in the RUQ just under the right breast. She has had CT, MRI, and lidocaine injections in the area of pain without any findings or relief.   Patient Active Problem List   Diagnosis Date Noted  . Functional diarrhea 11/02/2019  . Acute right-sided low back pain without sciatica 11/02/2019  . Bilateral hand pain 11/02/2019  . Chronic cholecystitis   . Abnormal computed tomography of large intestine   . Benign neoplasm of ascending colon   . Neoplasm of digestive system   . Presence of other specified functional implants   . Encounter for fitting and adjustment of other gastrointestinal appliance and device   . Calculus of common duct   . Abnormal findings on diagnostic imaging of gall bladder   . FHx: dementia 07/06/2017  . CLL (chronic lymphocytic leukemia) ([place]) 11/23/2016  . Cellulitis 08/22/2015  . 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 Overactive bladder 05/30/2015  . Mild cognitive impairment 02/07/2015  . Bloodgood disease 12/25/2014  . Colon polyp 12/25/2014  . 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 Cramps of lower extremity 12/25/2014  . Abnormal cerebrospinal fluid 12/25/2014  . Narrowing of intervertebral disc space 12/25/2014  . Contracture of palmar fascia (Dupuytren's) 12/25/2014  . Breath shortness 12/25/2014  . Essential (primary) hypertension 12/25/2014  . Benign essential tremor 12/25/2014  . Abnormal gait 12/25/2014  . Hypercholesteremia 12/25/2014  . Blood glucose elevated 12/25/2014  . Decreased potassium in the blood 12/25/2014  . Hypothyroidism 12/25/2014  . Insomnia 12/25/2014  . Headache, migraine, intractable 12/25/2014  . 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[place]) 12/25/2014  . Fibrositis 12/25/2014  . Discoloration of nail 12/25/2014  . 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 Arthritis sicca 12/25/2014  . Burning or prickling sensation 12/25/2014  . Post menopausal syndrome 12/25/2014  . Avitaminosis D 12/25/2014  . Migraine 12/14/2012   Past Medical History:  Diagnosis Date  . Allergy    some  . Cataract    bilaterally both eyes   . Chest pain    a. 07/2019 MV: EF 77%, no ischemia/infarct.  . 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 Diastolic dysfunction    a. 08/2019 Echo: EF 55-60%, no rwma, Gr1 DD, nl RV size/fxn.  . Gallstones   . GERD (gastroesophageal reflux disease)   . Hydrocephalus ([place])    [facility]   . Hyperglycemia   . Hyperlipidemia   . Hypertension   . Lymphocytosis    monoclonal b cell  . Migraine, unspecified, without mention of intractable migraine without mention of status migrainosus 12/14/2012  . Osteoarthritis,  chronic   . Osteoporosis   . Peptic ulcer    some bleeding with ulcers as well    Social History   Tobacco Use  . Smoking status: Never Smoker  . Smokeless tobacco: Never Used  Vaping Use  . Vaping Use: Never used  Substance Use Topics  . Alcohol use: Yes    Alcohol/week: 1.0 standard drink    Types: 1 Glasses of wine per week    Comment: Rare- wine on occasion  . Drug  use: No   Allergies  Allergen Reactions  . Shellfish Allergy     Throat closes, rash  . Latex Rash    Tapes,adhesives     Medications: Outpatient Medications Prior to Visit  Medication Sig  . ALPRAZolam (XANAX) 0.25 MG tablet Take 1-2 tablets (0.25-0.5 mg total) by mouth at bedtime as needed for anxiety.  [name] aspirin 81 MG tablet Take 81 mg by mouth daily.  . carvedilol (COREG) 12.5 MG tablet Take 1 tablet (12.5 mg total) by mouth 2 (two) times daily.  . Cholecalciferol (VITAMIN D3) 1.25 MG (50000 UT) CAPS Take 1 capsule by mouth once a week.  . colestipol (COLESTID) 1 g tablet TAKE 1 TABLET(1 GRAM) BY MOUTH TWICE DAILY  . donepezil (ARICEPT) 10 MG tablet Take 1 tablet (10 mg total) by mouth at bedtime. MUST BE SEEN FOR FURTHER REFILLS. CALL [phone].  [name] ezetimibe (ZETIA) 10 MG tablet TAKE 1 TABLET BY MOUTH AT BEDTIME  . gabapentin (NEURONTIN) 600 MG tablet TAKE 1/2 TABLET BY MOUTH AT BREAKFAST, 1/2 AT LUNCH, AND 1 AT BEDTIME  . HYDROcodone-acetaminophen (NORCO/VICODIN) 5-325 MG tablet Take 1 tablet by mouth every 6 (six) hours as needed for moderate pain.  [name] levothyroxine (SYNTHROID) 112 MCG tablet Take 1 tablet (112 mcg total) by mouth daily before breakfast.  . losartan (COZAAR) 50 MG tablet TAKE 1 TABLET BY MOUTH AT BEDTIME  . Magnesium 250 MG TABS Take 1 tablet by mouth daily.  . potassium chloride SA (K-DUR) 20 MEQ tablet TAKE 1 TABLET BY MOUTH TWICE DAILY  . solifenacin (VESICARE) 10 MG tablet TAKE 1 TABLET BY MOUTH EVERY DAY  . triamterene-hydrochlorothiazide (MAXZIDE) 75-50 MG tablet TAKE 1/2 TABLET BY MOUTH DAILY  . verapamil (VERELAN PM) 120 MG 24 hr capsule TAKE 1 CAPSULE BY MOUTH EVERY DAY. MUST HAVE APPOINTMENT FOR FURTHER REFILLS  . zolpidem (AMBIEN) 10 MG tablet Take 1 tablet (10 mg total) by mouth at bedtime.  . cyclobenzaprine (FLEXERIL) 10 MG tablet TAKE 1 TABLET(10 MG) BY MOUTH AT BEDTIME   No facility-administered medications prior to visit.    Review of  Systems  Constitutional: Negative.   Respiratory: Negative.   Cardiovascular: Negative.   Gastrointestinal: Positive for abdominal pain.  Musculoskeletal: Positive for back pain. Negative for arthralgias, gait problem, joint swelling, myalgias, neck pain and neck stiffness.  Neurological: Negative for dizziness, weakness, light-headedness and headaches.        Objective    BP 126/83 (BP Location: Right Arm, Patient Position: Sitting, Cuff Size: Large)   Pulse 84   Temp 98.4 F (36.9 C) (Oral)   Wt 191 lb (86.6 kg)   BMI 30.83 kg/m    Physical Exam Vitals reviewed.  Constitutional:      General: She is not in acute distress.    Appearance: Normal appearance. She is well-developed. She is obese. She is not ill-appearing or diaphoretic.  HENT:     Head: Normocephalic and atraumatic.  Cardiovascular:     Rate and  Rhythm: Normal rate and regular rhythm.     Heart sounds: Normal heart sounds. No murmur heard. No friction rub. No gallop.   Pulmonary:     Effort: Pulmonary effort is normal. No respiratory distress.     Breath sounds: Normal breath sounds. No wheezing or rales.  Abdominal:     General: Abdomen is flat. Bowel sounds are normal. There is no distension.     Palpations: Abdomen is soft. There is no mass.     Tenderness: There is abdominal tenderness in the suprapubic area. There is no guarding or rebound.     Hernia: No hernia is present.  Skin:    General: Skin is warm and dry.     Capillary Refill: Capillary refill takes less than 2 seconds.  Neurological:     General: No focal deficit present.     Mental Status: She is alert and oriented to person, place, and time. Mental status is at baseline.  Psychiatric:        Mood and Affect: Mood normal.        Thought Content: Thought content normal.      No results found for any visits on 06/26/20.  Assessment & Plan     1. Acute midline low back pain without sciatica New and worsening. Suspect DDD and  arthritis. No worrisome findings of radiculopathy at this time. Will get imaging as below. Norco given for prn pain.  - DG Lumbar Spine Complete; Future - HYDROcodone-acetaminophen (NORCO/VICODIN) 5-325 MG tablet; Take 1 tablet by mouth every 6 (six) hours as needed for moderate pain.  Dispense: 90 tablet; Refill: 0  2. Chronic right-sided thoracic back pain This has been long standing issue that is associated with the RUQ pain. Will get imaging to r/o compression fracture.  - DG Thoracic Spine W/Swimmers; Future - HYDROcodone-acetaminophen (NORCO/VICODIN) 5-325 MG tablet; Take 1 tablet by mouth every 6 (six) hours as needed for moderate pain.  Dispense: 90 tablet; Refill: 0  3. RUQ pain Imaging as noted above. Patient has had CT and MRI without any findings. Have also had lidocaine injections without relief. Pain is progressing per patient.  - DG Thoracic Spine W/Swimmers; Future - DG Ribs Unilateral Right; Future - HYDROcodone-acetaminophen (NORCO/VICODIN) 5-325 MG tablet; Take 1 tablet by mouth every 6 (six) hours as needed for moderate pain.  Dispense: 90 tablet; Refill: 0  4. Hypercholesterolemia Stable. Diagnosis pulled for medication refill. Continue current medical treatment plan. - ezetimibe (ZETIA) 10 MG tablet; Take 1 tablet (10 mg total) by mouth at bedtime.  Dispense: 90 tablet; Refill: 2  5. Intercostal neuralgia Working diagnosis of the RUQ pain. Patient already on gabapentin 600mg  without relief.   No follow-ups on file.      Reynolds Bowl, PA-C, have reviewed all documentation for this visit. The documentation on 07/07/20 for the exam, diagnosis, procedures, and orders are all accurate and complete.   Rubye Beach  [facility] [phone] (phone) [phone] (fax)  [place]

## 2020-06-28 ENCOUNTER — Ambulatory Visit: Payer: Medicare PPO | Admitting: Physician Assistant

## 2020-06-28 ENCOUNTER — Telehealth: Payer: Self-pay

## 2020-06-28 DIAGNOSIS — M5135 Other intervertebral disc degeneration, thoracolumbar region: Secondary | ICD-10-CM

## 2020-06-28 DIAGNOSIS — M47819 Spondylosis without myelopathy or radiculopathy, site unspecified: Secondary | ICD-10-CM

## 2020-06-28 NOTE — Telephone Encounter (Signed)
-----   Message from Mar Daring, [place] sent at 06/28/2020 10:20 AM EST ----- Stanton Kidney,  Rib xrays are again normal.  Grace Bushy, PAC

## 2020-06-28 NOTE — Telephone Encounter (Signed)
Pt advised.  She would like to go to [place].  Thanks,   -Mickel Baas

## 2020-06-28 NOTE — Telephone Encounter (Signed)
-----   Message from Mar Daring, [place] sent at 06/28/2020 10:22 AM EST ----- Stanton Kidney,  Low back xray also shows arthritic changes and some degenerative disc disease.  May be worthwhile to consider seeing orthopedic spine to see if they have any options, or make sure that these arthritic changes are not causing any nerve root impingement that might contribute to the RUQ pain you continue to have. If agreeable we can place referral.  Grace Bushy, [facility]

## 2020-06-28 NOTE — Telephone Encounter (Signed)
-----   Message from Mar Daring, [place] sent at 06/28/2020 10:19 AM EST ----- Stanton Kidney,  Mid back xray does show arthritic changes and thinning of the bone. No other abnormality.   Grace Bushy, [facility]

## 2020-07-01 ENCOUNTER — Inpatient Hospital Stay: Payer: Medicare PPO | Attending: Oncology

## 2020-07-01 ENCOUNTER — Ambulatory Visit: Payer: Medicare PPO | Admitting: Oncology

## 2020-07-01 DIAGNOSIS — M47819 Spondylosis without myelopathy or radiculopathy, site unspecified: Secondary | ICD-10-CM | POA: Insufficient documentation

## 2020-07-01 DIAGNOSIS — C9110 Chronic lymphocytic leukemia of B-cell type not having achieved remission: Secondary | ICD-10-CM | POA: Insufficient documentation

## 2020-07-01 DIAGNOSIS — M5135 Other intervertebral disc degeneration, thoracolumbar region: Secondary | ICD-10-CM | POA: Insufficient documentation

## 2020-07-01 DIAGNOSIS — E034 Atrophy of thyroid (acquired): Secondary | ICD-10-CM | POA: Diagnosis not present

## 2020-07-01 LAB — COMPREHENSIVE METABOLIC PANEL
ALT: 22 U/L (ref 0–44)
AST: 24 U/L (ref 15–41)
Albumin: 3.8 g/dL (ref 3.5–5.0)
Alkaline Phosphatase: 83 U/L (ref 38–126)
Anion gap: 12 (ref 5–15)
BUN: 19 mg/dL (ref 8–23)
CO2: 25 mmol/L (ref 22–32)
Calcium: 8.9 mg/dL (ref 8.9–10.3)
Chloride: 98 mmol/L (ref 98–111)
Creatinine, Ser: 1.25 mg/dL — ABNORMAL HIGH (ref 0.44–1.00)
GFR, Estimated: 44 mL/min — ABNORMAL LOW (ref >60)
Glucose, Bld: 136 mg/dL — ABNORMAL HIGH (ref 70–99)
Potassium: 3.3 mmol/L — ABNORMAL LOW (ref 3.5–5.1)
Sodium: 135 mmol/L (ref 135–145)
Total Bilirubin: 0.6 mg/dL (ref 0.3–1.2)
Total Protein: 6.8 g/dL (ref 6.5–8.1)

## 2020-07-01 LAB — CBC WITH DIFFERENTIAL/PLATELET
Abs Immature Granulocytes: 0.03 10*3/uL (ref 0.00–0.07)
Basophils Absolute: 0.1 10*3/uL (ref 0.0–0.1)
Basophils Relative: 0 %
Eosinophils Absolute: 1.1 10*3/uL — ABNORMAL HIGH (ref 0.0–0.5)
Eosinophils Relative: 7 %
HCT: 39.5 % (ref 36.0–46.0)
Hemoglobin: 13.2 g/dL (ref 12.0–15.0)
Immature Granulocytes: 0 %
Lymphocytes Relative: 55 %
Lymphs Abs: 8.8 10*3/uL — ABNORMAL HIGH (ref 0.7–4.0)
MCH: 29.6 pg (ref 26.0–34.0)
MCHC: 33.4 g/dL (ref 30.0–36.0)
MCV: 88.6 fL (ref 80.0–100.0)
Monocytes Absolute: 1.6 10*3/uL — ABNORMAL HIGH (ref 0.1–1.0)
Monocytes Relative: 10 %
Neutro Abs: 4.4 10*3/uL (ref 1.7–7.7)
Neutrophils Relative %: 28 %
Platelets: 310 10*3/uL (ref 150–400)
RBC: 4.46 MIL/uL (ref 3.87–5.11)
RDW: 14.6 % (ref 11.5–15.5)
Smear Review: NORMAL
WBC: 15.9 10*3/uL — ABNORMAL HIGH (ref 4.0–10.5)
nRBC: 0 % (ref 0.0–0.2)

## 2020-07-01 LAB — LIPID PANEL
Cholesterol: 183 mg/dL (ref 0–200)
HDL: 48 mg/dL (ref >40)
LDL Cholesterol: 100 mg/dL — ABNORMAL HIGH (ref 0–99)
Total CHOL/HDL Ratio: 3.8 RATIO
Triglycerides: 173 mg/dL — ABNORMAL HIGH (ref <150)
VLDL: 35 mg/dL (ref 0–40)

## 2020-07-01 LAB — VITAMIN B12: Vitamin B-12: 582 pg/mL (ref 180–914)

## 2020-07-01 LAB — IRON AND TIBC
Iron: 73 ug/dL (ref 28–170)
Saturation Ratios: 16 % (ref 10.4–31.8)
TIBC: 455 ug/dL — ABNORMAL HIGH (ref 250–450)
UIBC: 382 ug/dL

## 2020-07-01 LAB — FERRITIN: Ferritin: 8 ng/mL — ABNORMAL LOW (ref 11–307)

## 2020-07-01 LAB — TSH: TSH: 2.621 u[IU]/mL (ref 0.350–4.500)

## 2020-07-01 LAB — LACTATE DEHYDROGENASE: LDH: 185 U/L (ref 98–192)

## 2020-07-01 NOTE — Telephone Encounter (Signed)
Referral placed for her 

## 2020-07-02 LAB — T4: T4, Total: 8.4 ug/dL (ref 4.5–12.0)

## 2020-07-07 ENCOUNTER — Encounter: Payer: Self-pay | Admitting: Physician Assistant

## 2020-07-10 ENCOUNTER — Other Ambulatory Visit: Payer: Self-pay

## 2020-07-10 ENCOUNTER — Other Ambulatory Visit: Payer: Self-pay | Admitting: Neurology

## 2020-07-10 DIAGNOSIS — K591 Functional diarrhea: Secondary | ICD-10-CM

## 2020-07-10 MED ORDER — COLESTIPOL HCL 1 G PO TABS
ORAL_TABLET | ORAL | 5 refills | Status: DC
Start: 2020-07-10 — End: 2021-01-02

## 2020-07-10 NOTE — Telephone Encounter (Signed)
[place] faxed refill request for the following medications:  colestipol (COLESTID) 1 g tablet   Please advise.

## 2020-07-25 ENCOUNTER — Encounter: Payer: Self-pay | Admitting: Physician Assistant

## 2020-07-25 ENCOUNTER — Telehealth: Payer: Self-pay | Admitting: Neurology

## 2020-07-25 ENCOUNTER — Other Ambulatory Visit: Payer: Self-pay | Admitting: Neurology

## 2020-07-25 ENCOUNTER — Other Ambulatory Visit: Payer: Self-pay

## 2020-07-25 ENCOUNTER — Ambulatory Visit (INDEPENDENT_AMBULATORY_CARE_PROVIDER_SITE_OTHER): Payer: Medicare PPO | Admitting: Physician Assistant

## 2020-07-25 VITALS — BP 167/67 | HR 68 | Temp 98.4°F | Resp 16 | Ht 66.0 in | Wt 196.0 lb

## 2020-07-25 DIAGNOSIS — M8589 Other specified disorders of bone density and structure, multiple sites: Secondary | ICD-10-CM | POA: Diagnosis not present

## 2020-07-25 DIAGNOSIS — M546 Pain in thoracic spine: Secondary | ICD-10-CM | POA: Diagnosis not present

## 2020-07-25 DIAGNOSIS — R1011 Right upper quadrant pain: Secondary | ICD-10-CM | POA: Diagnosis not present

## 2020-07-25 DIAGNOSIS — N3281 Overactive bladder: Secondary | ICD-10-CM

## 2020-07-25 DIAGNOSIS — G588 Other specified mononeuropathies: Secondary | ICD-10-CM

## 2020-07-25 DIAGNOSIS — M545 Low back pain, unspecified: Secondary | ICD-10-CM | POA: Diagnosis not present

## 2020-07-25 DIAGNOSIS — F5101 Primary insomnia: Secondary | ICD-10-CM

## 2020-07-25 DIAGNOSIS — Z1239 Encounter for other screening for malignant neoplasm of breast: Secondary | ICD-10-CM | POA: Diagnosis not present

## 2020-07-25 DIAGNOSIS — Z0000 Encounter for general adult medical examination without abnormal findings: Secondary | ICD-10-CM | POA: Diagnosis not present

## 2020-07-25 DIAGNOSIS — I10 Essential (primary) hypertension: Secondary | ICD-10-CM

## 2020-07-25 DIAGNOSIS — E034 Atrophy of thyroid (acquired): Secondary | ICD-10-CM | POA: Diagnosis not present

## 2020-07-25 DIAGNOSIS — G8929 Other chronic pain: Secondary | ICD-10-CM

## 2020-07-25 MED ORDER — HYDROCODONE-ACETAMINOPHEN 5-325 MG PO TABS: 1.0000 | ORAL_TABLET | Freq: Four times a day (QID) | ORAL | 0 refills | Status: DC | PRN

## 2020-07-25 MED ORDER — VERAPAMIL HCL ER 120 MG PO CP24: ORAL_CAPSULE | ORAL | 0 refills | Status: DC

## 2020-07-25 MED ORDER — DONEPEZIL HCL 10 MG PO TABS: 10.0000 mg | ORAL_TABLET | Freq: Every day | ORAL | 0 refills | Status: DC

## 2020-07-25 MED ORDER — SOLIFENACIN SUCCINATE 10 MG PO TABS
10.0000 mg | ORAL_TABLET | Freq: Every day | ORAL | 3 refills | Status: DC
Start: 2020-07-25 — End: 2021-10-01

## 2020-07-25 MED ORDER — TRIAMTERENE-HCTZ 75-50 MG PO TABS: 0.5000 | ORAL_TABLET | Freq: Every day | ORAL | 3 refills | Status: DC

## 2020-07-25 MED ORDER — ZOLPIDEM TARTRATE 10 MG PO TABS
10.0000 mg | ORAL_TABLET | Freq: Every day | ORAL | 1 refills | Status: DC
Start: 2020-07-25 — End: 2021-02-14

## 2020-07-25 MED ORDER — LEVOTHYROXINE SODIUM 112 MCG PO TABS: 112.0000 ug | ORAL_TABLET | Freq: Every day | ORAL | 3 refills | Status: DC

## 2020-07-25 MED ORDER — ALPRAZOLAM 0.25 MG PO TABS: 0.2500 mg | ORAL_TABLET | Freq: Every evening | ORAL | 1 refills | Status: DC | PRN

## 2020-07-25 MED ORDER — LOSARTAN POTASSIUM 50 MG PO TABS: 1.0000 | ORAL_TABLET | Freq: Every day | ORAL | 3 refills | Status: DC

## 2020-07-25 MED ORDER — GABAPENTIN 600 MG PO TABS
600.0000 mg | ORAL_TABLET | Freq: Two times a day (BID) | ORAL | 2 refills | Status: DC
Start: 2020-07-25 — End: 2021-03-10

## 2020-07-25 NOTE — Telephone Encounter (Signed)
Pt has scheduled her annual f/u, pt is on the wait list as well. Pt is now requesting refill on her donepezil (ARICEPT) 10 MG tablet, verapamil (VERELAN PM) 120 MG 24 hr capsule & :  cyclobenzaprine to [facility] [phone]

## 2020-07-25 NOTE — Addendum Note (Signed)
Addended by: Gildardo Griffes on: 07/25/2020 12:02 PM   Modules accepted: Orders

## 2020-07-25 NOTE — Progress Notes (Signed)
Annual Wellness Visit     [name], Female    DOB: 1940-06-18, 80 y.o.   MRN: [id number] Visit Date: 07/25/2020  Today's Provider: Mar Daring, PA-C   Chief Complaint  Patient presents with  . Medicare [place] Garnett is a 80 y.o. female who presents today for her Annual Wellness Visit. She reports consuming a general diet. Exercise is limited by orthopedic condition(s): back pain. She generally feels fairly well. She reports sleeping well. She does not have additional problems to discuss today.   HPI  Patient Active Problem List   Diagnosis Date Noted  . DDD (degenerative disc disease), thoracolumbar 07/01/2020  . Degenerative arthropathy of spinal facet joint 07/01/2020  . Intercostal neuralgia 06/26/2020  . Chronic right-sided thoracic back pain 06/26/2020  . Functional diarrhea 11/02/2019  . Acute midline low back pain without sciatica 11/02/2019  . Bilateral hand pain 11/02/2019  . Chronic cholecystitis   . Abnormal computed tomography of large intestine   . Benign neoplasm of ascending colon   . Neoplasm of digestive system   . Presence of other specified functional implants   . Encounter for fitting and adjustment of other gastrointestinal appliance and device   . Calculus of common duct   . Abnormal findings on diagnostic imaging of gall bladder   . FHx: dementia 07/06/2017  . CLL (chronic lymphocytic leukemia) ([place]) 11/23/2016  . Cellulitis 08/22/2015  . 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 Overactive bladder 05/30/2015  . Mild cognitive impairment 02/07/2015  . Bloodgood disease 12/25/2014  . Colon polyp 12/25/2014  . 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 Cramps of lower extremity 12/25/2014  . Abnormal cerebrospinal fluid 12/25/2014  . Narrowing of intervertebral disc space 12/25/2014  . Contracture of palmar fascia (Dupuytren's) 12/25/2014  . Breath shortness 12/25/2014  . Essential  (primary) hypertension 12/25/2014  . Benign essential tremor 12/25/2014  . Abnormal gait 12/25/2014  . Hypercholesteremia 12/25/2014  . Blood glucose elevated 12/25/2014  . Decreased potassium in the blood 12/25/2014  . Hypothyroidism 12/25/2014  . Insomnia 12/25/2014  . Headache, migraine, intractable 12/25/2014  . 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[place]) 12/25/2014  . Fibrositis 12/25/2014  . Discoloration of nail 12/25/2014  . 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 Arthritis sicca 12/25/2014  . Burning or prickling sensation 12/25/2014  . Post menopausal syndrome 12/25/2014  . Avitaminosis D 12/25/2014  . Migraine 12/14/2012   Past Surgical History:  Procedure Laterality Date  . ABDOMINAL HYSTERECTOMY    . BREAST CYST ASPIRATION Left 1993   Removed  . breast cyst removed Right    benign   . BREAST LUMPECTOMY Bilateral   . COLONOSCOPY    . ERCP N/A 02/04/2018   Procedure: ENDOSCOPIC RETROGRADE CHOLANGIOPANCREATOGRAPHY (ERCP);  Surgeon: Lucilla Lame, MD;  Location: [facility] ENDOSCOPY;  Service: Endoscopy;  Laterality: N/A;  . ERCP N/A 02/28/2018   Procedure: ENDOSCOPIC RETROGRADE CHOLANGIOPANCREATOGRAPHY (ERCP) STENT REMOVAL;  Surgeon: Lucilla Lame, MD;  Location: [facility] ENDOSCOPY;  Service: Endoscopy;  Laterality: N/A;  . EYE SURGERY    . growth removal Left 2019   L wrist growth removed, abnormal cells  . History of Cataract surgery Right 2011   left 2011  . PARTIAL HYSTERECTOMY    . POLYPECTOMY    . ROBOTIC ASSISTED LAPAROSCOPIC CHOLECYSTECTOMY-MULTI SITE N/A 03/08/2018   Procedure: ROBOTIC ASSISTED LAPAROSCOPIC CHOLECYSTECTOMY-MULTI SITE;  Surgeon: Jules Husbands, MD;  Location: [facility] ORS;  Service: General;  Laterality: N/A;  .  S/P ACL repair Left    knee  . TONSILLECTOMY    . TUBAL LIGATION  1975   Bilateral   Social History   Socioeconomic History  . Marital status: Divorced    Spouse name: Not on file  . Number of children: 1  . Years of education: college  .  Highest education level: Not on file  Occupational History  . Occupation: book Production designer, theatre/television/film: OTHER    Comment: Paediatric nurse  Tobacco Use  . Smoking status: Never Smoker  . Smokeless tobacco: Never Used  Vaping Use  . Vaping Use: Never used  Substance and Sexual Activity  . Alcohol use: Yes    Alcohol/week: 1.0 standard drink    Types: 1 Glasses of wine per week    Comment: Rare- wine on occasion  . Drug use: No  . Sexual activity: Not Currently  Other Topics Concern  . Not on file  Social History Narrative   Patient is right handed, resides alone. Divorced.   Social Determinants of Health   Financial Resource Strain: Not on file  Food Insecurity: Not on file  Transportation Needs: Not on file  Physical Activity: Not on file  Stress: Not on file  Social Connections: Not on file  Intimate Partner Violence: Not on file   Family History  Problem Relation Age of Onset  . Colon cancer Mother   . Hypertension Mother   . Colon cancer Father   . Heart disease Father   . Hypertension Father   . Colon polyps Neg Hx   . Rectal cancer Neg Hx   . Stomach cancer Neg Hx    Allergies  Allergen Reactions  . Shellfish Allergy     Throat closes, rash  . Latex Rash    Tapes,adhesives       Medications: Outpatient Medications Prior to Visit  Medication Sig  . ALPRAZolam (XANAX) 0.25 MG tablet Take 1-2 tablets (0.25-0.5 mg total) by mouth at bedtime as needed for anxiety.  [name] aspirin 81 MG tablet Take 81 mg by mouth daily.  . carvedilol (COREG) 12.5 MG tablet Take 1 tablet (12.5 mg total) by mouth 2 (two) times daily.  . Cholecalciferol (VITAMIN D3) 1.25 MG (50000 UT) CAPS Take 1 capsule by mouth once a week.  . colestipol (COLESTID) 1 g tablet TAKE 1 TABLET(1 GRAM) BY MOUTH TWICE DAILY  . donepezil (ARICEPT) 10 MG tablet TAKE 1 TABLET(10 MG) BY MOUTH AT BEDTIME  . ezetimibe (ZETIA) 10 MG tablet Take 1 tablet (10 mg total) by mouth at bedtime.  . gabapentin  (NEURONTIN) 600 MG tablet TAKE 1/2 TABLET BY MOUTH AT BREAKFAST, 1/2 AT LUNCH, AND 1 AT BEDTIME  . HYDROcodone-acetaminophen (NORCO/VICODIN) 5-325 MG tablet Take 1 tablet by mouth every 6 (six) hours as needed for moderate pain.  [name] levothyroxine (SYNTHROID) 112 MCG tablet Take 1 tablet (112 mcg total) by mouth daily before breakfast.  . losartan (COZAAR) 50 MG tablet TAKE 1 TABLET BY MOUTH AT BEDTIME  . Magnesium 250 MG TABS Take 1 tablet by mouth daily.  . potassium chloride SA (K-DUR) 20 MEQ tablet TAKE 1 TABLET BY MOUTH TWICE DAILY  . solifenacin (VESICARE) 10 MG tablet TAKE 1 TABLET BY MOUTH EVERY DAY  . triamterene-hydrochlorothiazide (MAXZIDE) 75-50 MG tablet TAKE 1/2 TABLET BY MOUTH DAILY  . verapamil (VERELAN PM) 120 MG 24 hr capsule TAKE 1 CAPSULE BY MOUTH EVERY DAY. MUST HAVE APPOINTMENT FOR FURTHER REFILLS  . zolpidem (AMBIEN) 10  MG tablet Take 1 tablet (10 mg total) by mouth at bedtime.   No facility-administered medications prior to visit.    Allergies  Allergen Reactions  . Shellfish Allergy     Throat closes, rash  . Latex Rash    Tapes,adhesives    Patient Care Team: Mar Daring, PA-C as PCP - General (Family Medicine) Wellington Hampshire, MD as PCP - Cardiology (Cardiology) Monna Fam, MD as Consulting Physician (Ophthalmology) [name], MD as Consulting Physician (Neurology)  Review of Systems  Constitutional: Negative.   HENT: Positive for tinnitus.   Eyes: Negative.   Respiratory: Negative.   Cardiovascular: Negative.   Gastrointestinal: Positive for abdominal pain.  Endocrine: Negative.   Genitourinary: Negative.   Musculoskeletal: Positive for back pain.  Allergic/Immunologic: Negative.   Neurological: Negative.   Hematological: Negative.   Psychiatric/Behavioral: Negative.     Last CBC Lab Results  Component Value Date   WBC 15.9 (H) 07/01/2020   HGB 13.2 07/01/2020   HCT 39.5 07/01/2020   MCV 88.6 07/01/2020   MCH 29.6  07/01/2020   RDW 14.6 07/01/2020   PLT 310 86/57/8469   Last metabolic panel Lab Results  Component Value Date   GLUCOSE 136 (H) 07/01/2020   NA 135 07/01/2020   K 3.3 (L) 07/01/2020   CL 98 07/01/2020   CO2 25 07/01/2020   BUN 19 07/01/2020   CREATININE 1.25 (H) 07/01/2020   GFRNONAA 44 (L) 07/01/2020   GFRAA 46 (L) 12/15/2019   CALCIUM 8.9 07/01/2020   PROT 6.8 07/01/2020   ALBUMIN 3.8 07/01/2020   LABGLOB 2.6 02/02/2018   AGRATIO 1.7 02/02/2018   BILITOT 0.6 07/01/2020   ALKPHOS 83 07/01/2020   AST 24 07/01/2020   ALT 22 07/01/2020   ANIONGAP 12 07/01/2020   Last lipids Lab Results  Component Value Date   CHOL 183 07/01/2020   HDL 48 07/01/2020   LDLCALC 100 (H) 07/01/2020   TRIG 173 (H) 07/01/2020   CHOLHDL 3.8 07/01/2020   Last hemoglobin A1c Lab Results  Component Value Date   HGBA1C 6.2 (H) 07/26/2019   Last thyroid functions Lab Results  Component Value Date   TSH 2.621 07/01/2020   T4TOTAL 8.4 07/01/2020   Last vitamin D Lab Results  Component Value Date   VD25OH 26.8 (L) 07/26/2019   Last vitamin B12 and Folate Lab Results  Component Value Date   VITAMINB12 582 07/01/2020   FOLATE 10.4 02/07/2015        Objective    Vitals: BP (!) 167/67 (BP Location: Right Arm, Patient Position: Sitting, Cuff Size: Large)   Pulse 68   Temp 98.4 F (36.9 C) (Oral)   Resp 16   Ht 5\' 6"  (1.676 m)   Wt 196 lb (88.9 kg)   SpO2 98%   BMI 31.64 kg/m  BP Readings from Last 3 Encounters:  07/25/20 (!) 167/67  06/26/20 126/83  04/10/20 130/70   Wt Readings from Last 3 Encounters:  07/25/20 196 lb (88.9 kg)  06/26/20 191 lb (86.6 kg)  04/10/20 195 lb (88.5 kg)      Physical Exam Vitals reviewed.  Constitutional:      General: She is not in acute distress.    Appearance: Normal appearance. She is well-developed. She is obese. She is not ill-appearing or diaphoretic.  HENT:     Head: Normocephalic and atraumatic.     Right Ear: Tympanic  membrane, ear canal and external ear normal.     Left  Ear: Tympanic membrane, ear canal and external ear normal.     Nose: Nose normal.     Mouth/Throat:     Mouth: Mucous membranes are moist.     Pharynx: Oropharynx is clear. No oropharyngeal exudate.  Eyes:     General: No scleral icterus.       Right eye: No discharge.        Left eye: No discharge.     Extraocular Movements: Extraocular movements intact.     Conjunctiva/sclera: Conjunctivae normal.     Pupils: Pupils are equal, round, and reactive to light.  Neck:     Thyroid: No thyromegaly.     Vascular: No carotid bruit or JVD.     Trachea: No tracheal deviation.  Cardiovascular:     Rate and Rhythm: Normal rate and regular rhythm.     Pulses: Normal pulses.     Heart sounds: Normal heart sounds. No murmur heard. No friction rub. No gallop.   Pulmonary:     Effort: Pulmonary effort is normal. No respiratory distress.     Breath sounds: Normal breath sounds. No wheezing or rales.  Chest:     Chest wall: No tenderness.  Abdominal:     General: Abdomen is flat. Bowel sounds are normal. There is no distension.     Palpations: Abdomen is soft. There is no mass.     Tenderness: There is no abdominal tenderness. There is no guarding or rebound.  Musculoskeletal:        General: No tenderness. Normal range of motion.     Cervical back: Normal range of motion and neck supple. No tenderness.     Right lower leg: No edema.     Left lower leg: No edema.  Lymphadenopathy:     Cervical: No cervical adenopathy.  Skin:    General: Skin is warm and dry.     Capillary Refill: Capillary refill takes less than 2 seconds.     Findings: No rash.  Neurological:     General: No focal deficit present.     Mental Status: She is alert and oriented to person, place, and time. Mental status is at baseline.  Psychiatric:        Mood and Affect: Mood normal.        Behavior: Behavior normal.        Thought Content: Thought content normal.         Judgment: Judgment normal.      Most recent functional status assessment: In your present state of health, do you have any difficulty performing the following activities: 07/25/2020  Hearing? N  Vision? N  Difficulty concentrating or making decisions? N  Walking or climbing stairs? Y  Dressing or bathing? N  Doing errands, shopping? N  Some recent data might be hidden   Most recent fall risk assessment: Fall Risk  07/25/2020  Falls in the past year? 0  Number falls in past yr: 0  Injury with Fall? 0  Risk for fall due to : -  Follow up Falls evaluation completed    Most recent depression screenings: PHQ 2/9 Scores 07/25/2020 06/26/2020  PHQ - 2 Score 0 1  PHQ- 9 Score 1 3   Most recent cognitive screening: 6CIT Screen 07/02/2016  What Year? 0 points  What month? 0 points  What time? 0 points  Count back from 20 0 points  Months in reverse 0 points  Repeat phrase 0 points  Total Score 0   Most recent  Audit-C alcohol use screening Alcohol Use Disorder Test (AUDIT) 07/25/2020  1. How often do you have a drink containing alcohol? 3  2. How many drinks containing alcohol do you have on a typical day when you are drinking? 0  3. How often do you have six or more drinks on one occasion? 0  AUDIT-C Score 3  4. How often during the last year have you found that you were not able to stop drinking once you had started? 0  5. How often during the last year have you failed to do what was normally expected from you because of drinking? 0  6. How often during the last year have you needed a first drink in the morning to get yourself going after a heavy drinking session? 0  7. How often during the last year have you had a feeling of guilt of remorse after drinking? 0  8. How often during the last year have you been unable to remember what happened the night before because you had been drinking? 0  9. Have you or someone else been injured as a result of your drinking? 0  10. Has a relative  or friend or a doctor or another health worker been concerned about your drinking or suggested you cut down? 0  Alcohol Use Disorder Identification Test Final Score (AUDIT) 3   A score of 3 or more in women, and 4 or more in men indicates increased risk for alcohol abuse, EXCEPT if all of the points are from question 1   No results found for any visits on 07/25/20.  Assessment & Plan     Annual wellness visit done today including the all of the following: Reviewed patient's Family Medical History Reviewed and updated list of patient's medical providers Assessment of cognitive impairment was done Assessed patient's functional ability Established a written schedule for health screening [place] Completed and Reviewed  Exercise Activities and Dietary recommendations Goals    . Increase water intake     Recommend increasing water intake to 3 glasses a day.       Immunization History  Administered Date(s) Administered  . Influenza Split 01/09/2010, 12/24/2011  . Influenza, High Dose Seasonal PF 01/07/2014, 01/03/2018, 12/13/2018  . Influenza-Unspecified 02/01/2017  . PFIZER(Purple Top)SARS-COV-2 Vaccination 05/10/2019, 06/03/2019, 03/04/2020  . Pneumococcal Conjugate-13 06/25/2014  . Pneumococcal Polysaccharide-23 05/04/2011  . Td 04/18/2004  . Tdap 11/09/2012    Health Maintenance  Topic Date Due  . MAMMOGRAM  04/08/2019  . DEXA SCAN  05/01/2019  . COVID-19 Vaccine (4 - Booster for Pfizer series) 09/01/2020  . INFLUENZA VACCINE  11/18/2020  . TETANUS/TDAP  11/10/2022  . Hepatitis C Screening  Completed  . PNA vac Low Risk Adult  Completed  . HPV VACCINES  Aged Out     Discussed health benefits of physical activity, and encouraged her to engage in regular exercise appropriate for her age and condition.    1. Medicare annual wellness visit, subsequent Normal exam. Up to date on screenings and vaccinations.   2. Annual physical exam Normal exam.  Labs from 07/01/20 reviewed.  3. Encounter for breast cancer screening using non-mammogram modality There is no family history of breast cancer. Does have personal history of cyst removals. She does perform regular self breast exams. Mammogram was ordered as below. Information for [facility] Breast clinic was given to patient so she may schedule her mammogram at her convenience. - MM 3D SCREEN BREAST BILATERAL; Future  4. Osteopenia of  multiple sites Last BMD was 2016. Due now. Ordered as below.  - DG Bone Density; Future  5. Intercostal neuralgia Stable. Diagnosis pulled for medication refill. Continue current medical treatment plan. - gabapentin (NEURONTIN) 600 MG tablet; Take 1 tablet (600 mg total) by mouth 2 (two) times daily.  Dispense: 180 tablet; Refill: 2  6. Acute midline low back pain without sciatica Stable. Diagnosis pulled for medication refill. Continue current medical treatment plan.Seeing Ortho on 08/02/20. - HYDROcodone-acetaminophen (NORCO/VICODIN) 5-325 MG tablet; Take 1 tablet by mouth every 6 (six) hours as needed for moderate pain.  Dispense: 90 tablet; Refill: 0  7. Chronic right-sided thoracic back pain Stable. Diagnosis pulled for medication refill. Continue current medical treatment plan. - HYDROcodone-acetaminophen (NORCO/VICODIN) 5-325 MG tablet; Take 1 tablet by mouth every 6 (six) hours as needed for moderate pain.  Dispense: 90 tablet; Refill: 0  8. RUQ pain Stable. Diagnosis pulled for medication refill. Continue current medical treatment plan. - HYDROcodone-acetaminophen (NORCO/VICODIN) 5-325 MG tablet; Take 1 tablet by mouth every 6 (six) hours as needed for moderate pain.  Dispense: 90 tablet; Refill: 0  9. Hypothyroidism due to acquired atrophy of thyroid Stable. Diagnosis pulled for medication refill. Continue current medical treatment plan. - levothyroxine (SYNTHROID) 112 MCG tablet; Take 1 tablet (112 mcg total) by mouth daily before breakfast.   Dispense: 90 tablet; Refill: 3  10. Essential (primary) hypertension Stable. Diagnosis pulled for medication refill. Continue current medical treatment plan. - losartan (COZAAR) 50 MG tablet; Take 1 tablet (50 mg total) by mouth at bedtime.  Dispense: 90 tablet; Refill: 3 - triamterene-hydrochlorothiazide (MAXZIDE) 75-50 MG tablet; Take 0.5 tablets by mouth daily.  Dispense: 90 tablet; Refill: 3  11. Overactive bladder Stable. Diagnosis pulled for medication refill. Continue current medical treatment plan. - solifenacin (VESICARE) 10 MG tablet; Take 1 tablet (10 mg total) by mouth daily.  Dispense: 90 tablet; Refill: 3  12. Primary insomnia Stable. Diagnosis pulled for medication refill. Continue current medical treatment plan. - ALPRAZolam (XANAX) 0.25 MG tablet; Take 1-2 tablets (0.25-0.5 mg total) by mouth at bedtime as needed for anxiety.  Dispense: 180 tablet; Refill: 1 - zolpidem (AMBIEN) 10 MG tablet; Take 1 tablet (10 mg total) by mouth at bedtime.  Dispense: 90 tablet; Refill: 1   No follow-ups on file.     Reynolds Bowl, PA-C, have reviewed all documentation for this visit. The documentation on 07/25/20 for the exam, diagnosis, procedures, and orders are all accurate and complete.   Rubye Beach  [facility] [phone] (phone) [phone] (fax)  [place]

## 2020-07-25 NOTE — Patient Instructions (Signed)
Preventive Care 79 Years and Older, Female Preventive care refers to lifestyle choices and visits with your health care provider that can promote health and wellness. This includes:  A yearly physical exam. This is also called an annual wellness visit.  Regular dental and eye exams.  Immunizations.  Screening for certain conditions.  Healthy lifestyle choices, such as: ? Eating a healthy diet. ? Getting regular exercise. ? Not using drugs or products that contain nicotine and tobacco. ? Limiting alcohol use. What can I expect for my preventive care visit? Physical exam Your health care provider will check your:  Height and weight. These may be used to calculate your BMI (body mass index). BMI is a measurement that tells if you are at a healthy weight.  Heart rate and blood pressure.  Body temperature.  Skin for abnormal spots. Counseling Your health care provider may ask you questions about your:  Past medical problems.  Family's medical history.  Alcohol, tobacco, and drug use.  Emotional well-being.  Home life and relationship well-being.  Sexual activity.  Diet, exercise, and sleep habits.  History of falls.  Memory and ability to understand (cognition).  Work and work Statistician.  Pregnancy and menstrual history.  Access to firearms. What immunizations do I need? Vaccines are usually given at various ages, according to a schedule. Your health care provider will recommend vaccines for you based on your age, medical history, and lifestyle or other factors, such as travel or where you work.   What tests do I need? Blood tests  Lipid and cholesterol levels. These may be checked every 5 years, or more often depending on your overall health.  Hepatitis C test.  Hepatitis B test. Screening  Lung cancer screening. You may have this screening every year starting at age 79 if you have a 30-pack-year history of smoking and currently smoke or have quit within  the past 15 years.  Colorectal cancer screening. ? All adults should have this screening starting at age 79 and continuing until age 79. ? Your health care provider may recommend screening at age 79 if you are at increased risk. ? You will have tests every 1-10 years, depending on your results and the type of screening test.  Diabetes screening. ? This is done by checking your blood sugar (glucose) after you have not eaten for a while (fasting). ? You may have this done every 1-3 years.  Mammogram. ? This may be done every 1-2 years. ? Talk with your health care provider about how often you should have regular mammograms.  Abdominal aortic aneurysm (AAA) screening. You may need this if you are a current or former smoker.  BRCA-related cancer screening. This may be done if you have a family history of breast, ovarian, tubal, or peritoneal cancers. Other tests  STD (sexually transmitted disease) testing, if you are at risk.  Bone density scan. This is done to screen for osteoporosis. You may have this done starting at age 79. Talk with your health care provider about your test results, treatment options, and if necessary, the need for more tests. Follow these instructions at home: Eating and drinking  Eat a diet that includes fresh fruits and vegetables, whole grains, lean protein, and low-fat dairy products. Limit your intake of foods with high amounts of sugar, saturated fats, and salt.  Take vitamin and mineral supplements as recommended by your health care provider.  Do not drink alcohol if your health care provider tells you not to drink.  If you drink alcohol: ? Limit how much you have to 0-1 drink a day. ? Be aware of how much alcohol is in your drink. In the [place] one drink equals one 12 oz bottle of beer (355 mL), one 5 oz glass of wine (148 mL), or one 1 oz glass of hard liquor (44 mL).   Lifestyle  Take daily care of your teeth and gums. Brush your teeth every morning  and night with fluoride toothpaste. Floss one time each day.  Stay active. Exercise for at least 30 minutes 5 or more days each week.  Do not use any products that contain nicotine or tobacco, such as cigarettes, e-cigarettes, and chewing tobacco. If you need help quitting, ask your health care provider.  Do not use drugs.  If you are sexually active, practice safe sex. Use a condom or other form of protection in order to prevent STIs (sexually transmitted infections).  Talk with your health care provider about taking a low-dose aspirin or statin.  Find healthy ways to cope with stress, such as: ? Meditation, yoga, or listening to music. ? Journaling. ? Talking to a trusted person. ? Spending time with friends and family. Safety  Always wear your seat belt while driving or riding in a vehicle.  Do not drive: ? If you have been drinking alcohol. Do not ride with someone who has been drinking. ? When you are tired or distracted. ? While texting.  Wear a helmet and other protective equipment during sports activities.  If you have firearms in your house, make sure you follow all gun safety procedures. What's next?  Visit your health care provider once a year for an annual wellness visit.  Ask your health care provider how often you should have your eyes and teeth checked.  Stay up to date on all vaccines. This information is not intended to replace advice given to you by your health care provider. Make sure you discuss any questions you have with your health care provider. Document Revised: 03/27/2020 Document Reviewed: 03/31/2018 Elsevier Patient Education  2021 [employer]vier Inc.  

## 2020-08-16 ENCOUNTER — Telehealth: Payer: Self-pay | Admitting: Physician Assistant

## 2020-08-16 DIAGNOSIS — Z780 Asymptomatic menopausal state: Secondary | ICD-10-CM

## 2020-08-16 DIAGNOSIS — Z1231 Encounter for screening mammogram for malignant neoplasm of breast: Secondary | ICD-10-CM

## 2020-08-16 NOTE — Telephone Encounter (Signed)
Orders have been completed.   Thanks,   -Mickel Baas

## 2020-08-16 NOTE — Telephone Encounter (Signed)
[facility] states they will need an order for pt's mammogram and bone density. They will not schedule using existing order because Grace Bushy is no longer in pracriice,Thanks

## 2020-08-16 NOTE — Telephone Encounter (Signed)
OK to order MM and BMD under my name for screening. thanks

## 2020-08-19 DIAGNOSIS — L570 Actinic keratosis: Secondary | ICD-10-CM | POA: Diagnosis not present

## 2020-08-19 DIAGNOSIS — L821 Other seborrheic keratosis: Secondary | ICD-10-CM | POA: Diagnosis not present

## 2020-08-19 DIAGNOSIS — Z85828 Personal history of other malignant neoplasm of skin: Secondary | ICD-10-CM | POA: Diagnosis not present

## 2020-08-19 DIAGNOSIS — D1801 Hemangioma of skin and subcutaneous tissue: Secondary | ICD-10-CM | POA: Diagnosis not present

## 2020-08-19 DIAGNOSIS — D225 Melanocytic nevi of trunk: Secondary | ICD-10-CM | POA: Diagnosis not present

## 2020-08-26 DIAGNOSIS — M545 Low back pain, unspecified: Secondary | ICD-10-CM | POA: Diagnosis not present

## 2020-09-06 ENCOUNTER — Other Ambulatory Visit: Payer: Self-pay | Admitting: Physician Assistant

## 2020-09-06 DIAGNOSIS — E559 Vitamin D deficiency, unspecified: Secondary | ICD-10-CM

## 2020-09-18 ENCOUNTER — Ambulatory Visit: Payer: Medicare PPO | Admitting: Neurology

## 2020-09-18 ENCOUNTER — Encounter: Payer: Self-pay | Admitting: Neurology

## 2020-09-18 VITALS — BP 191/78 | HR 66 | Ht 64.5 in | Wt 191.0 lb

## 2020-09-18 DIAGNOSIS — G3184 Mild cognitive impairment, so stated: Secondary | ICD-10-CM | POA: Diagnosis not present

## 2020-09-18 MED ORDER — VERAPAMIL HCL ER 120 MG PO CP24: ORAL_CAPSULE | ORAL | 0 refills | Status: DC

## 2020-09-18 MED ORDER — DONEPEZIL HCL 10 MG PO TABS: 10.0000 mg | ORAL_TABLET | Freq: Every day | ORAL | 4 refills | Status: DC

## 2020-09-18 MED ORDER — CYCLOBENZAPRINE HCL 10 MG PO TABS: 10.0000 mg | ORAL_TABLET | Freq: Every evening | ORAL | 3 refills | Status: DC | PRN

## 2020-09-18 NOTE — Progress Notes (Signed)
[facility]    Provider:  Dr Jaynee Eagles Referring Provider: Florian Buff* Primary Care Physician:  Mar Daring, PA-C  CC: Memory loss  09/18/2020: - Formal memory testing was not consistent with any significant pattern of cognitive ecline or impairment. Dr. Sima Matas 01/2019. In fact the patient did exceptionally well. Diagnosed with mild cognitive impairment which may be related to sleep disturbance and medication effects (ambien, xanax) as well as anxiety. She feels her memory is stable. Her son in law passed away so she is living with her daughter and grandson. She has not had covid, she has been immunized,    08/23/2018  She has been seeing [place] for multiple years for memory and she was referred to formal memory testing last year but could not get it completed will reorder. We decreased her Topiramate. She also has migraines. She is on Aricept. She had a sleep test in the past and no signs or symptoms of OSA. Also recommended decreasing Gabapentin and other meds. She were continuing to work until recently due to [place]. She hopes to go back to work when reopen. Migraines are well controlled.    Interval history 06/30/2017: Patient's MMSE again 30/30 today. She feels her memory is still worsening, she has extensive family history of dementia on both sides. . She tripped over a cord and hit the door frame when she was working. Her nose was possibly broken, she had bruising all over the face. She did not go to the emergency room. No headaches. We titrated her down on the Topiramate because of memory complaints on multiple medications that could cause symptoms. No concussive symptoms.    Interval history 11/14/2015: Patient comes back today. She is feeling much better as far as memory loss go since decreasing the Topamax from 200-100. She sees no changes in her migraines 100 mg. We'll further decrease Topamax to 50 mg. She continues to have mild cognitive  impairment. Montral cognitive assessment score was 23 but Mini-Mental status exam was 30 out of 30. We have clinical trial here, CREAD, discussed at length with patient and he should be a wonderful candidate for this clinical trial which includes imaging and no cost patient including PET amyloid scans which would be very interesting especially in this patient's case. I discussed this at length, in the research team also came in and discuss with her provided brochures. We will continue to follow her every 6 months.  Interval history 05/06/2015: She is having eye twitching with the Aricept, she had diarrhea with 10mg  and she went back to 5mg . Reviewed labs and MRI images with patient and compared them to 2010 MRI which is mostly stable. Showed chronic microvascular ischemic changes, discussed vascular risk factors and treatment.   She has migraines, every other day last 4-6 hours, unilateral, either side, throbbing and pulsatin with light sensitivity, sounds sensitivity, nausea no vomiting. No aura. No medication obveruse. On Topamax. On Atenolol. Failed Depakote, Pamelor and Elavil, she has failed imitrex oral and takes shots which may or may not help. She can't work, they can be severe 10/10 on average 5-6/10. At least 16 or more migraines a month. For the last 5-10 years or longer.   HPI: ANTHONY ROLAND is a 80 y.o. female here as a referral from Dr. Venia Minks for memory loss. PMHx of migraine, HTN, essential tremor. She is a patient of Dr. [name] and Ward Givens and is seeing me today. Symptoms started within the last 2 months. She is  having problems with numbers. She is having some short-term memory changes but most significantly with numbers. 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 She is scared, something is not right. Started acutely and getting worse. She took some  prednisone for her foot and then this problem started. That may be a coincidence. She has stopped the steroids. No new medication changes, nothing new. No head trauma or inciting events. No other short-term memory changes except sometimes she forgets little things when she is very tired. Her TSH is monitored by pcp for hypothyroidism. No snoring at night, not excessively tired during the day, she had a sleep test in the past. No alteration of consciousness. No other focal neurologic symptoms.   Review of Systems: Patient complains of symptoms per HPI as well as the following symptoms: memory, recent stressors . Pertinent negatives and positives per HPI. All others negative .  Social History   Socioeconomic History  . Marital status: Divorced    Spouse name: Not on file  . Number of children: 1  . Years of education: college  . Highest education level: Not on file  Occupational History  . Occupation: book Production designer, theatre/television/film: OTHER    Comment: Paediatric nurse  Tobacco Use  . Smoking status: Never Smoker  . Smokeless tobacco: Never Used  Vaping Use  . Vaping Use: Never used  Substance and Sexual Activity  . Alcohol use: Yes    Alcohol/week: 1.0 - 3.0 standard drink    Types: 1 - 3 Glasses of wine per week  . Drug use: No  . Sexual activity: Not Currently  Other Topics Concern  . Not on file  Social History Narrative   Patient is right handed, resides with daughter and grandson in her home. Divorced.      Social Determinants of Health   Financial Resource Strain: Not on file  Food Insecurity: Not on file  Transportation Needs: Not on file  Physical Activity: Not on file  Stress: Not on file  Social Connections: Not on file  Intimate Partner Violence: Not on file    Family History  Problem Relation Age of Onset  . Colon cancer Mother   . Hypertension Mother   . Colon cancer Father   . Heart disease Father   . Hypertension Father   . Colon polyps Neg Hx   .  Rectal cancer Neg Hx   . Stomach cancer Neg Hx     Past Medical History:  Diagnosis Date  . Allergy    some  . Cataract    bilaterally both eyes   . Chest pain    a. 07/2019 MV: EF 77%, no ischemia/infarct.  . 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 Diastolic dysfunction    a. 08/2019 Echo: EF 55-60%, no rwma, Gr1 DD, nl RV size/fxn.  . Gallstones   . GERD (gastroesophageal reflux disease)   . Hydrocephalus ([place])    [facility]   . Hyperglycemia   . Hyperlipidemia   . Hypertension   . Lymphocytosis    monoclonal b cell  . Migraine, unspecified, without mention of intractable migraine without mention of status migrainosus 12/14/2012  . Osteoarthritis, chronic   . Osteoporosis   . Peptic ulcer    some bleeding with ulcers as well  Past Surgical History:  Procedure Laterality Date  . ABDOMINAL HYSTERECTOMY    . BREAST CYST ASPIRATION Left 1993   Removed  . breast cyst removed Right    benign   . BREAST LUMPECTOMY Bilateral   . COLONOSCOPY    . ERCP N/A 02/04/2018   Procedure: ENDOSCOPIC RETROGRADE CHOLANGIOPANCREATOGRAPHY (ERCP);  Surgeon: Lucilla Lame, MD;  Location[facility] ENDOSCOPY;  Service: Endoscopy;  Laterality: N/A;  . ERCP N/A 02/28/2018   Procedure: ENDOSCOPIC RETROGRADE CHOLANGIOPANCREATOGRAPHY (ERCP) STENT REMOVAL;  Surgeon: Lucilla Lame, MD;  Location: [facility] ENDOSCOPY;  Service: Endoscopy;  Laterality: N/A;  . EYE SURGERY    . growth removal Left 2019   L wrist growth removed, abnormal cells  . History of Cataract surgery Right 2011   left 2011  . PARTIAL HYSTERECTOMY    . POLYPECTOMY    . ROBOTIC ASSISTED LAPAROSCOPIC CHOLECYSTECTOMY-MULTI SITE N/A 03/08/2018   Procedure: ROBOTIC ASSISTED LAPAROSCOPIC CHOLECYSTECTOMY-MULTI SITE;  Surgeon: Jules Husbands, MD;  Location: [facility] ORS;  Service: General;  Laterality: N/A;  . S/P ACL repair Left    knee  . TONSILLECTOMY    . TUBAL LIGATION  1975   Bilateral    Current Outpatient  Medications  Medication Sig Dispense Refill  . ALPRAZolam (XANAX) 0.25 MG tablet Take 1-2 tablets (0.25-0.5 mg total) by mouth at bedtime as needed for anxiety. 180 tablet 1  . aspirin 81 MG tablet Take 81 mg by mouth daily.    . carvedilol (COREG) 12.5 MG tablet Take 1 tablet (12.5 mg total) by mouth 2 (two) times daily. 180 tablet 3  . Cholecalciferol (VITAMIN D3) 1.25 MG (50000 UT) CAPS Take 1 capsule by mouth once a week. 12 capsule 3  . colestipol (COLESTID) 1 g tablet TAKE 1 TABLET(1 GRAM) BY MOUTH TWICE DAILY 60 tablet 5  . cyclobenzaprine (FLEXERIL) 10 MG tablet Take 1 tablet (10 mg total) by mouth at bedtime as needed for muscle spasms. 90 tablet 3  . ezetimibe (ZETIA) 10 MG tablet Take 1 tablet (10 mg total) by mouth at bedtime. 90 tablet 2  . gabapentin (NEURONTIN) 600 MG tablet Take 1 tablet (600 mg total) by mouth 2 (two) times daily. 180 tablet 2  . HYDROcodone-acetaminophen (NORCO/VICODIN) 5-325 MG tablet Take 1 tablet by mouth every 6 (six) hours as needed for moderate pain. 90 tablet 0  . levothyroxine (SYNTHROID) 112 MCG tablet Take 1 tablet (112 mcg total) by mouth daily before breakfast. 90 tablet 3  . losartan (COZAAR) 50 MG tablet Take 1 tablet (50 mg total) by mouth at bedtime. 90 tablet 3  . Magnesium 250 MG TABS Take 1 tablet by mouth daily.    . potassium chloride SA (K-DUR) 20 MEQ tablet TAKE 1 TABLET BY MOUTH TWICE DAILY 180 tablet 1  . solifenacin (VESICARE) 10 MG tablet Take 1 tablet (10 mg total) by mouth daily. 90 tablet 3  . triamterene-hydrochlorothiazide (MAXZIDE) 75-50 MG tablet Take 0.5 tablets by mouth daily. 90 tablet 3  . zolpidem (AMBIEN) 10 MG tablet Take 1 tablet (10 mg total) by mouth at bedtime. 90 tablet 1  . donepezil (ARICEPT) 10 MG tablet Take 1 tablet (10 mg total) by mouth at bedtime. 90 tablet 4  . verapamil (VERELAN PM) 120 MG 24 hr capsule TAKE 1 CAPSULE BY MOUTH EVERY DAY. MUST HAVE APPOINTMENT FOR FURTHER REFILLS 90 capsule 0   No current  facility-administered medications for this visit.    Allergies as of 09/18/2020 - Review Complete 09/18/2020  Allergen Reaction Noted  . Shellfish allergy  12/17/2014  . Latex Rash 07/20/2016    Vitals: BP (!) 191/78 (BP Location: Right Arm, Patient Position: Sitting)   Pulse 66   Ht 5' 4.5" (1.638 m)   Wt 191 lb (86.6 kg)   BMI 32.28 kg/m  Last Weight:  Wt Readings from Last 1 Encounters:  09/18/20 191 lb (86.6 kg)   Last Height:   Ht Readings from Last 1 Encounters:  09/18/20 5' 4.5" (1.638 m)   MMSE - [place] Exam 09/18/2020 07/05/2017 05/18/2016  Orientation to time 5 5 5   Orientation to Place 4 5 5   Registration 3 3 3   Attention/ Calculation 5 5 5   Recall 3 3 3   Language- name 2 objects 2 2 2   Language- repeat 1 1 1   Language- follow 3 step command 3 3 3   Language- read & follow direction 1 1 1   Write a sentence 1 1 1   Copy design 1 1 1   Total score 29 30 30    Montreal Cognitive Assessment  02/07/2015  Visuospatial/ Executive (0/5) 3  Naming (0/3) 2  Attention: Read list of digits (0/2) 2  Attention: Read list of letters (0/1) 1  Attention: Serial 7 subtraction starting at 100 (0/3) 1  Language: Repeat phrase (0/2) 0  Language : Fluency (0/1) 1  Abstraction (0/2) 2  Delayed Recall (0/5) 5  Orientation (0/6) 6  Total 23  Adjusted Score (based on education) 23   Exam: NAD, pleasant                  Speech:    Speech is normal; fluent and spontaneous with normal comprehension.  Cognition:    The patient is oriented to person, place, and time;     recent and remote memory intact;     language fluent;    Cranial Nerves:    The pupils are equal, round, and reactive to light.Trigeminal sensation is intact and the muscles of mastication are normal. The face is symmetric. The palate elevates in the midline. Hearing intact. Voice is normal. Shoulder shrug is normal. The tongue has normal motion without fasciculations.   Coordination:  No  dysmetria  Motor Observation:    No asymmetry, no atrophy, and no involuntary movements noted. Tone:    Normal muscle tone.     Strength:    Strength is V/V in the upper and lower limbs.      Sensation: intact to LT      Assessment/Plan: 80 y.o. Absolutely lovely female here as a referral from Dr. Venia Minks for memory loss. MMSE 29-30/30 but MoCA 23/30, stable mild cognitive impairment. Could also very well be medication related as she is on a lot of medications that can cause cognitive problems such as  flexeril, neurontin, alprazolam.  we have been seeing this very lovely patient for many years who continues to feel impaired and has a FHx of dementia on both sides of the family.    - Formal memory testing was not consistent with any significant pattern of cognitive ecline or impairment. Dr. Sima Matas 01/2019. In fact the patient did exceptionally well. Diagnosed with mild cognitive impairment which may be related to sleep disturbance and medication effects (ambien, xanax) as well as anxiety.. - We decreased her Topiramate, migraines stable. - She is on Aricept can continue.  - She had a sleep test in the past and no signs or symptoms of OSA. - - recommended decreasing Gabapentin and other meds  that may cause cognitive dysfunction - MRi of the brain unremarkable, chronic microvascular ischemic white matter changes. Discussed vascular risk factors and trestment (BP control, diet and exercise, statins, watching glucose) - B12, tsh unremarkable  I spent over 20 minutes of face-to-face and non-face-to-face time with patient on the  1. MCI (mild cognitive impairment)    diagnosis.  This included previsit chart review, lab review, study review, order entry, electronic health record documentation, patient education on the different diagnostic and therapeutic options, counseling and coordination of care, risks and benefits of management, compliance, or risk factor reduction  Sarina Ill,  MD  [facility] [address] [place], [place] [address]  Phone [phone] Fax [phone]

## 2020-09-23 ENCOUNTER — Telehealth: Payer: Self-pay | Admitting: Neurology

## 2020-09-23 NOTE — Telephone Encounter (Signed)
Pt called, had spoke with Dr. Jaynee Eagles during my last office visit about refilling medications; losartan (COZAAR) 50 MG tablet, solifenacin (VESICARE) 10 MG tablet, Cholecalciferol (VITAMIN D3) 1.25 MG (50000 UT) CAPS. Because Fenton Malling, [place]  has quit. Would like a call from the nurse.  Informed Pt physician could not refill these medications because she was not the prescribing physician.

## 2020-09-24 ENCOUNTER — Other Ambulatory Visit: Payer: Self-pay | Admitting: Neurology

## 2020-09-24 DIAGNOSIS — E559 Vitamin D deficiency, unspecified: Secondary | ICD-10-CM

## 2020-09-24 MED ORDER — VITAMIN D3 1.25 MG (50000 UT) PO CAPS: 1.0000 | ORAL_CAPSULE | ORAL | 3 refills | Status: DC

## 2020-09-24 NOTE — Telephone Encounter (Signed)
I called the patient's pharmacy.  They did not have any new refills on file of the vitamin D.  Most recent one was sent May 17, 2020 for 84-day supply.  I was told the patient has refills on file for Vesicare, losartan, and zolpidem.  The patient should call the pharmacy to have the prescription filled when needed.  I called patient and let her know.  The patient is aware that Dr. Jaynee Eagles is out of the office next week so I will need to ask her next week regarding vitamin D.  Patient verbalized appreciation for the call.

## 2020-09-24 NOTE — Telephone Encounter (Signed)
Spoke with patient. Cathy Barnes, pt's PCP, had prescribed all of these medications with enough refills to last a year, until early 2023. The pt's pharmacy is not open yet but I will call them to inquire about why she is unable to continue filling these medications.

## 2020-10-03 ENCOUNTER — Ambulatory Visit: Payer: Medicare PPO | Admitting: Neurology

## 2020-10-16 ENCOUNTER — Telehealth: Payer: Self-pay | Admitting: *Deleted

## 2020-10-16 NOTE — Telephone Encounter (Signed)
She will need to see Ander Purpura or Anderson Malta

## 2020-10-16 NOTE — Telephone Encounter (Signed)
Patient called requesting to see doctor soon and not wait until her August appointment. She reports that "I fell like I have been hit by a mack truck"  Has no energy and that the pain under her right breast has gotten much worse to the point to heat and cold is not effective in alleviating her pain. She states that her PCP has left practice and needs assistance form our office. Please advise

## 2020-10-17 ENCOUNTER — Telehealth: Payer: Self-pay | Admitting: Oncology

## 2020-10-17 NOTE — Telephone Encounter (Signed)
Cathy Barnes, I see Cathy Barnes is on PAL--are you ok to see her today?

## 2020-10-17 NOTE — Telephone Encounter (Signed)
Left VM with patient on home and cell phone. Patient called triage on 6/29 reporting increased fatigue and other symptoms-Dr. [name] advised that she come in to see a NP. Requested patient call back in order to set that up.

## 2020-10-21 ENCOUNTER — Other Ambulatory Visit: Payer: Self-pay | Admitting: Nurse Practitioner

## 2020-10-30 ENCOUNTER — Telehealth: Payer: Self-pay | Admitting: Physician Assistant

## 2020-10-30 DIAGNOSIS — E034 Atrophy of thyroid (acquired): Secondary | ICD-10-CM

## 2020-10-30 NOTE — Telephone Encounter (Signed)
I called pharmacy and confirmed that patient still has remaining refills on Levothyroxine. Pharmacy also verified that the Verapamil was sent into the pharmacy by patients Neurologist Dr. Jaynee Eagles on 09/18/2020.

## 2020-10-30 NOTE — Telephone Encounter (Signed)
[facility] faxed refill request for the following medications:  levothyroxine (SYNTHROID) 112 MCG tablet   verapamil (VERELAN PM) 120 MG 24 hr capsule    Please advise.

## 2020-11-14 ENCOUNTER — Other Ambulatory Visit: Payer: Self-pay | Admitting: Physician Assistant

## 2020-11-14 DIAGNOSIS — F5101 Primary insomnia: Secondary | ICD-10-CM

## 2020-11-18 ENCOUNTER — Telehealth (INDEPENDENT_AMBULATORY_CARE_PROVIDER_SITE_OTHER): Payer: Medicare PPO | Admitting: Family Medicine

## 2020-11-18 ENCOUNTER — Encounter: Payer: Self-pay | Admitting: Family Medicine

## 2020-11-18 ENCOUNTER — Ambulatory Visit: Payer: Self-pay

## 2020-11-18 ENCOUNTER — Other Ambulatory Visit: Payer: Self-pay

## 2020-11-18 DIAGNOSIS — U071 COVID-19: Secondary | ICD-10-CM | POA: Diagnosis not present

## 2020-11-18 MED ORDER — NIRMATRELVIR/RITONAVIR (PAXLOVID) TABLET (RENAL DOSING): 2.0000 | ORAL_TABLET | Freq: Two times a day (BID) | ORAL | 0 refills | Status: AC

## 2020-11-18 NOTE — Assessment & Plan Note (Signed)
3 day history; known exposure to 2 family members who reside with the patient  Anti viral adjusted for kidney function  Pt and family masking at home

## 2020-11-18 NOTE — Telephone Encounter (Signed)
Pt tested positive for Covid yesterday and today with rapid test. Pt having productive cough with phlegm yellow to green in color. No SOB, chest pain or fever.  Main sx deep cough, sore throat and headache. Pt has received 3 covid vaccine and 1 booster. Pt had virtual visit today and provder called in Paxlovid.  Advised pt to drink at least 6-8 glasses of water and to try and eat bland food. Pt has lost sense of taste and smell. Try a humidifier and drink warm fluids to relax airway and make it easier for the phlegm to come up.   Forwarding to office for provider to review.          Answer Assessment - Initial Assessment Questions 1. COVID-19 DIAGNOSIS: "Who made your COVID-19 diagnosis?" "Was it confirmed by a positive lab test or self-test?" If not diagnosed by a doctor (or NP/PA), ask "Are there lots of cases (community spread) where you live?" Note: See public health department website, if unsure.     2 rapid tests/ yes 2. COVID-19 EXPOSURE: "Was there any known exposure to COVID before the symptoms began?" CDC Definition of close contact: within 6 feet (2 meters) for a total of 15 minutes or more over a 24-hour period.      Yolanda Bonine got sick Tuesday and daughter Covid 3. ONSET: "When did the COVID-19 symptoms start?"      Saturday am  4. WORST SYMPTOM: "What is your worst symptom?" (e.g., cough, fever, shortness of breath, muscle aches)     Deep cough sore throat, headache 5. COUGH: "Do you have a cough?" If Yes, ask: "How bad is the cough?"       Productive cough yellow green in color 6. FEVER: "Do you have a fever?" If Yes, ask: "What is your temperature, how was it measured, and when did it start?"     no 7. RESPIRATORY STATUS: "Describe your breathing?" (e.g., shortness of breath, wheezing, unable to speak)      Sounds congestion 8. BETTER-SAME-WORSE: "Are you getting better, staying the same or getting worse compared to yesterday?"  If getting worse, ask, "In what way?"      same 9. HIGH RISK DISEASE: "Do you have any chronic medical problems?" (e.g., asthma, heart or lung disease, weak immune system, obesity, etc.)     Elderly, HTN,  10. VACCINE: "Have you had the COVID-19 vaccine?" If Yes, ask: "Which one, how many shots, when did you get it?"       Ye 3 shots 1st shot Pfizer: 05/10/19  2nd 06/03/19 11. BOOSTER: "Have you received your COVID-19 booster?" If Yes, ask: "Which one and when did you get it?"       1st booster Pfizer 03/04/20  13. OTHER SYMPTOMS: "Do you have any other symptoms?"  (e.g., chills, fatigue, headache, loss of smell or taste, muscle pain, sore throat)       Sore throat, cough, headache 14. O2 SATURATION MONITOR:  "Do you use an oxygen saturation monitor (pulse oximeter) at home?" If Yes, ask "What is your reading (oxygen level) today?" "What is your usual oxygen saturation reading?" (e.g., 95%)       no  Protocols used: Coronavirus (COVID-19) Diagnosed or Suspected-A-AH

## 2020-11-18 NOTE — Progress Notes (Signed)
MyChart Video Visit     Virtual Visit via Video Note     This visit type was conducted due to national recommendations for  restrictions regarding the COVID-19 Pandemic (e.g. social distancing) in  an effort to limit this patient's exposure and mitigate transmission in  our community. This patient is at least at moderate risk for complications  without adequate follow up. This format is felt to be most appropriate for  this patient at this time. Physical exam was limited by quality of the  video and audio technology used for the visit.     Patient location: home Provider location: [facility]       [name]   DOB: [birth year]   80 y.o. Female  MRN: [id number]  Visit Date: 11/18/2020    Today's Provider: [name], FNP   Subjective:    No chief complaint on file.    HPI  Saturday morning, feeling the same, not much sleep, no appetite and trying  to consume plenty of fluids. Tested Saturday morning and came back  positive and today as well. Slight temperature taken yesterday and no  fever present.     Patient Active Problem List    Diagnosis Date Noted    DDD (degenerative disc disease), thoracolumbar 07/01/2020    Degenerative arthropathy of spinal facet joint 07/01/2020    Intercostal neuralgia 06/26/2020    Chronic right-sided thoracic back pain 06/26/2020    Functional diarrhea 11/02/2019    Acute midline low back pain without sciatica 11/02/2019    Bilateral hand pain 11/02/2019    Chronic cholecystitis     Abnormal computed tomography of large intestine     Benign neoplasm of ascending colon     Neoplasm of digestive system     Presence of other specified functional implants     Encounter for fitting and adjustment of other gastrointestinal appliance  and device     Calculus of common duct     Abnormal findings on diagnostic imaging of gall bladder     FHx: dementia 07/06/2017    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    Cellulitis 08/22/2015     Monoarthritis, ankle and foot 08/16/2015    Overactive bladder 05/30/2015    Mild cognitive impairment 02/07/2015    Bloodgood disease 12/25/2014    Colon polyp 12/25/2014    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) 12/25/2014    Fibrositis 12/25/2014    Discoloration of nail 12/25/2014    Cervico-occipital neuralgia 12/25/2014    Arthritis sicca 12/25/2014    Burning or prickling sensation 12/25/2014    Post menopausal syndrome 12/25/2014    Avitaminosis D 12/25/2014    Migraine 12/14/2012     Past Medical History:   Diagnosis Date    Allergy     some    Cataract     bilaterally both eyes  Chest pain     a. 07/2019 MV: EF 77%, no ischemia/infarct.    CLL (chronic lymphocytic leukemia) (HCC)     stage 0    Diastolic dysfunction     a. 08/2019 Echo: EF 55-60%, no rwma, Gr1 DD, nl RV size/fxn.    Gallstones     GERD (gastroesophageal reflux disease)     Hydrocephalus (H[facility])     [facility]     Hyperglycemia     Hyperlipidemia     Hypertension     Lymphocytosis     monoclonal b cell    Migraine, unspecified, without mention of intractable migraine without  mention of status migrainosus 12/14/2012    Osteoarthritis, chronic     Osteoporosis     Peptic ulcer     some bleeding with ulcers as well      Allergies   Allergen Reactions    Shellfish Allergy      Throat closes, rash     Latex Rash     Tapes,adhesives          Medications:  Outpatient Medications Prior to Visit   Medication Sig    ALPRAZolam (XANAX) 0.25 MG tablet Take 1-2 tablets (0.25-0.5 mg total) by  mouth at bedtime as needed for anxiety.    aspirin 81 MG tablet Take 81 mg by mouth daily.    carvedilol (COREG) 12.5 MG tablet TAKE 1 TABLET(12.5 MG) BY MOUTH TWICE  DAILY    Cholecalciferol (VITAMIN D3) 1.25 MG (50000 UT) CAPS Take 1 capsule by  mouth once a week.    colestipol (COLESTID) 1 g tablet TAKE 1 TABLET(1 GRAM) BY MOUTH TWICE  DAILY    cyclobenzaprine (FLEXERIL) 10 MG tablet Take 1 tablet (10 mg total) by  mouth at bedtime as needed for muscle spasms.    donepezil (ARICEPT) 10 MG tablet Take 1 tablet (10 mg total) by mouth at  bedtime.    ezetimibe (ZETIA) 10 MG tablet Take 1 tablet (10 mg total) by mouth at  bedtime.    gabapentin (NEURONTIN) 600 MG tablet Take 1 tablet (600 mg total) by  mouth 2 (two) times daily.    HYDROcodone-acetaminophen (NORCO/VICODIN) 5-325 MG tablet Take 1 tablet  by mouth every 6 (six) hours as needed for moderate pain.    levothyroxine (SYNTHROID) 112 MCG tablet Take 1 tablet (112 mcg total) by  mouth daily before breakfast.    losartan (COZAAR) 50 MG tablet Take 1 tablet (50 mg total) by mouth at  bedtime.    Magnesium 250 MG TABS Take 1 tablet by mouth daily.    potassium chloride SA (K-DUR) 20 MEQ tablet TAKE 1 TABLET BY MOUTH TWICE  DAILY    solifenacin (VESICARE) 10 MG tablet Take 1 tablet (10 mg total) by mouth  daily.    triamterene-hydrochlorothiazide (MAXZIDE) 75-50 MG tablet Take 0.5  tablets by mouth daily.    verapamil (VERELAN PM) 120 MG 24 hr capsule TAKE 1 CAPSULE BY MOUTH EVERY  DAY. MUST HAVE APPOINTMENT FOR FURTHER REFILLS    zolpidem (AMBIEN) 10 MG tablet Take 1 tablet (10 mg total) by mouth at  bedtime.     No facility-administered medications prior to visit.       Last CBC  Lab Results   Component Value Date    WBC 15.9 (H) 07/01/2020     HGB 13.2 07/01/2020    HCT 39.5 07/01/2020    MCV 88.6 07/01/2020    MCH 29.6 07/01/2020  RDW 14.6 07/01/2020    PLT 310 123456     Last metabolic panel  Lab Results   Component Value Date    GLUCOSE 136 (H) 07/01/2020    NA 135 07/01/2020    K 3.3 (L) 07/01/2020    CL 98 07/01/2020    CO2 25 07/01/2020    BUN 19 07/01/2020    CREATININE 1.25 (H) 07/01/2020    GFRNONAA 44 (L) 07/01/2020    GFRAA 46 (L) 12/15/2019    CALCIUM 8.9 07/01/2020    PROT 6.8 07/01/2020    ALBUMIN 3.8 07/01/2020    LABGLOB 2.6 02/02/2018    AGRATIO 1.7 02/02/2018    BILITOT 0.6 07/01/2020    ALKPHOS 83 07/01/2020    AST 24 07/01/2020    ALT 22 07/01/2020    ANIONGAP 12 07/01/2020     Last lipids  Lab Results   Component Value Date    CHOL 183 07/01/2020    HDL 48 07/01/2020    LDLCALC 100 (H) 07/01/2020    TRIG 173 (H) 07/01/2020    CHOLHDL 3.8 07/01/2020     Last hemoglobin A1c  Lab Results   Component Value Date    HGBA1C 6.2 (H) 07/26/2019     Last thyroid functions  Lab Results   Component Value Date    TSH 2.621 07/01/2020    T4TOTAL 8.4 07/01/2020     Last vitamin D  Lab Results   Component Value Date    VD25OH 26.8 (L) 07/26/2019     Last vitamin B12 and Folate  Lab Results   Component Value Date    VITAMINB12 582 07/01/2020    FOLATE 10.4 02/07/2015          Review of Systems    Last CBC  Lab Results   Component Value Date    WBC 15.9 (H) 07/01/2020    HGB 13.2 07/01/2020    HCT 39.5 07/01/2020    MCV 88.6 07/01/2020    MCH 29.6 07/01/2020    RDW 14.6 07/01/2020    PLT 310 [id number]     Last metabolic panel  Lab Results   Component Value Date    GLUCOSE 136 (H) 07/01/2020    NA 135 07/01/2020    K 3.3 (L) 07/01/2020    CL 98 07/01/2020    CO2 25 07/01/2020    BUN 19 07/01/2020    CREATININE 1.25 (H) 07/01/2020    GFRNONAA 44 (L) 07/01/2020    GFRAA 46 (L) 12/15/2019    CALCIUM 8.9 07/01/2020    PROT 6.8 07/01/2020    ALBUMIN 3.8  07/01/2020    LABGLOB 2.6 02/02/2018    AGRATIO 1.7 02/02/2018    BILITOT 0.6 07/01/2020    ALKPHOS 83 07/01/2020    AST 24 07/01/2020    ALT 22 07/01/2020    ANIONGAP 12 07/01/2020     Last lipids  Lab Results   Component Value Date    CHOL 183 07/01/2020    HDL 48 07/01/2020    LDLCALC 100 (H) 07/01/2020    TRIG 173 (H) 07/01/2020    CHOLHDL 3.8 07/01/2020     Last hemoglobin A1c  Lab Results   Component Value Date    HGBA1C 6.2 (H) 07/26/2019     Last thyroid functions  Lab Results   Component Value Date    TSH 2.621 07/01/2020    T4TOTAL 8.4 07/01/2020     Last vitamin D  Lab Results   Component  Value Date    VD25OH 26.8 (L) 07/26/2019     Last vitamin B12 and Folate  Lab Results   Component Value Date    VITAMINB12 582 07/01/2020    FOLATE 10.4 02/07/2015            Objective:      There were no vitals taken for this visit.  BP Readings from Last 3 Encounters:   09/18/20 (!) 191/78   07/25/20 (!) 167/67   06/26/20 126/83     Wt Readings from Last 3 Encounters:   09/18/20 191 lb (86.6 kg)   07/25/20 196 lb (88.9 kg)   06/26/20 191 lb (86.6 kg)          Physical Exam      PE limited by virtual call; patient is Awake, Alert, and Oriented.  Speaking in clear sentences, with no respiratory difficulties.  Skin appears dry.  Breathing is not labored.  Denies cough.     Assessment & Plan:      Problem List Items Addressed This Visit       Other   COVID-19 - Primary    3 day history; known exposure to 2 family members who reside with the patient  Anti viral adjusted for kidney function  Pt and family masking at home            I discussed the assessment and treatment plan with the patient. The  patient was provided an opportunity to ask questions and all were  answered. The patient agreed with the plan and demonstrated an  understanding of the instructions.     The patient was advised to call back or seek an in-person evaluation if  the  symptoms worsen or if the condition fails to improve as anticipated.    I provided 13 minutes of non-face-to-face time during this encounter.      [name], [place]  [phone] (phone)  [phone] (fax)    [place]

## 2020-11-19 ENCOUNTER — Telehealth: Payer: Medicare PPO | Admitting: Family Medicine

## 2020-11-20 ENCOUNTER — Ambulatory Visit: Payer: Medicare PPO | Admitting: Family Medicine

## 2020-12-12 ENCOUNTER — Other Ambulatory Visit: Payer: Self-pay | Admitting: *Deleted

## 2020-12-12 DIAGNOSIS — C9110 Chronic lymphocytic leukemia of B-cell type not having achieved remission: Secondary | ICD-10-CM

## 2020-12-16 ENCOUNTER — Other Ambulatory Visit: Payer: Self-pay

## 2020-12-16 ENCOUNTER — Inpatient Hospital Stay: Payer: Medicare PPO | Admitting: Nurse Practitioner

## 2020-12-16 ENCOUNTER — Inpatient Hospital Stay: Payer: Medicare PPO | Attending: Oncology

## 2020-12-16 VITALS — BP 161/67 | HR 64 | Temp 96.7°F | Resp 20 | Wt 200.0 lb

## 2020-12-16 DIAGNOSIS — I10 Essential (primary) hypertension: Secondary | ICD-10-CM | POA: Insufficient documentation

## 2020-12-16 DIAGNOSIS — E039 Hypothyroidism, unspecified: Secondary | ICD-10-CM | POA: Insufficient documentation

## 2020-12-16 DIAGNOSIS — C9110 Chronic lymphocytic leukemia of B-cell type not having achieved remission: Secondary | ICD-10-CM | POA: Insufficient documentation

## 2020-12-16 DIAGNOSIS — D721 Eosinophilia, unspecified: Secondary | ICD-10-CM

## 2020-12-16 DIAGNOSIS — D7219 Other eosinophilia: Secondary | ICD-10-CM | POA: Diagnosis not present

## 2020-12-16 LAB — CBC WITH DIFFERENTIAL/PLATELET
Abs Immature Granulocytes: 0.06 10*3/uL (ref 0.00–0.07)
Basophils Absolute: 0.1 10*3/uL (ref 0.0–0.1)
Basophils Relative: 1 %
Eosinophils Absolute: 1.2 10*3/uL — ABNORMAL HIGH (ref 0.0–0.5)
Eosinophils Relative: 7 %
HCT: 39.5 % (ref 36.0–46.0)
Hemoglobin: 13 g/dL (ref 12.0–15.0)
Immature Granulocytes: 0 %
Lymphocytes Relative: 55 %
Lymphs Abs: 9.5 10*3/uL — ABNORMAL HIGH (ref 0.7–4.0)
MCH: 29.3 pg (ref 26.0–34.0)
MCHC: 32.9 g/dL (ref 30.0–36.0)
MCV: 89 fL (ref 80.0–100.0)
Monocytes Absolute: 2.1 10*3/uL — ABNORMAL HIGH (ref 0.1–1.0)
Monocytes Relative: 12 %
Neutro Abs: 4.3 10*3/uL (ref 1.7–7.7)
Neutrophils Relative %: 25 %
Platelets: 259 10*3/uL (ref 150–400)
RBC: 4.44 MIL/uL (ref 3.87–5.11)
RDW: 14.8 % (ref 11.5–15.5)
Smear Review: NORMAL
WBC: 17.2 10*3/uL — ABNORMAL HIGH (ref 4.0–10.5)
nRBC: 0 % (ref 0.0–0.2)

## 2020-12-16 LAB — COMPREHENSIVE METABOLIC PANEL
ALT: 21 U/L (ref 0–44)
AST: 22 U/L (ref 15–41)
Albumin: 3.8 g/dL (ref 3.5–5.0)
Alkaline Phosphatase: 79 U/L (ref 38–126)
Anion gap: 9 (ref 5–15)
BUN: 18 mg/dL (ref 8–23)
CO2: 28 mmol/L (ref 22–32)
Calcium: 9 mg/dL (ref 8.9–10.3)
Chloride: 99 mmol/L (ref 98–111)
Creatinine, Ser: 1.17 mg/dL — ABNORMAL HIGH (ref 0.44–1.00)
GFR, Estimated: 48 mL/min — ABNORMAL LOW (ref >60)
Glucose, Bld: 118 mg/dL — ABNORMAL HIGH (ref 70–99)
Potassium: 3.6 mmol/L (ref 3.5–5.1)
Sodium: 136 mmol/L (ref 135–145)
Total Bilirubin: 0.2 mg/dL — ABNORMAL LOW (ref 0.3–1.2)
Total Protein: 6.8 g/dL (ref 6.5–8.1)

## 2020-12-16 LAB — LACTATE DEHYDROGENASE: LDH: 174 U/L (ref 98–192)

## 2020-12-16 NOTE — Progress Notes (Signed)
Hematology/Oncology Consult Note [facility]  [phone] [phone]  Patient Care Team: Rubye Beach as PCP - General (Family Medicine) Wellington Hampshire, MD as PCP - Cardiology (Cardiology) Monna Fam, MD as Consulting Physician (Ophthalmology) [name], MD as Consulting Physician (Neurology)   Name of the [name]  [id number]  Sep 16, 1940   Date of visit: 12/16/20  Diagnosis- 1.  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  Moderate eosinophilia possibly secondary to CLL etiology unclear  Chief complaint/ Reason for visit-routine follow-up of CLL  Heme/Onc history: patient is a 80 year old female with a past medical history significant for hypertension, vitamin D deficiency and hypothyroidism. She has been sent to [place] for evaluation of leukocytosis. Over the last 1 year patient's white count has been around 12 with predominantly lymphocytosis and monocytosis as well as eosinophilia. No evidence of anemia or thrombocytopenia. Overall patient is doing well and denies any complaints of fevers, chills, unintentional weight loss, fatigue or night sweats. Denies any lumps or bumps anywhere   Further blood work from 07-2016 was as follows: CBC showed white count of 12.9 with an absolute lymphocyte count of 6.6 and an eosinophilia of 1.1. H&H was 13.4/41.6 and a platelet count of 270. CMP was unremarkable. Review of peripheral smear showed mild leukocytosis with absolute lymphocytosis and eosinophilia. BCR abl testing was negative for CML. Flow cytometry showed CD5 positive, CD23 positive clonal B-cell population CLL/SLL phenotype CD38 negative. 36% of leukocytes are less than 5000/mcL. eosinophilia  Interval history-patient is 80 year old female who returns to clinic for labs, further evaluation, and continued surveillance of CLL.  She has chronic fatigue which is stable and unchanged.  No interval fevers, chills,  infections, lumps or bumps.She had covid earlier this year but has recovered. Wasn't severe.   ECOG PS- 1 Pain scale- 0  Review of systems- Review of Systems  Constitutional:  Positive for malaise/fatigue. Negative for chills, fever and weight loss.  HENT:  Negative for hearing loss, nosebleeds, sore throat and tinnitus.   Eyes:  Negative for blurred vision and double vision.  Respiratory:  Negative for cough, hemoptysis, shortness of breath and wheezing.   Cardiovascular:  Negative for chest pain, palpitations and leg swelling.  Gastrointestinal:  Negative for abdominal pain, blood in stool, constipation, diarrhea, melena, nausea and vomiting.  Genitourinary:  Negative for dysuria and urgency.  Musculoskeletal:  Negative for back pain, falls, joint pain and myalgias.  Skin:  Negative for itching and rash.  Neurological:  Negative for dizziness, tingling, sensory change, loss of consciousness, weakness and headaches.  Endo/Heme/Allergies:  Negative for environmental allergies. Does not bruise/bleed easily.  Psychiatric/Behavioral:  Negative for depression. The patient is not nervous/anxious and does not have insomnia.       Allergies  Allergen Reactions   Shellfish Allergy     Throat closes, rash   Latex Rash    Tapes,adhesives     Past Medical History:  Diagnosis Date   Allergy    some   Cataract    bilaterally both eyes    Chest pain    a. 07/2019 MV: EF 77%, no ischemia/infarct.   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 08/2019 Echo: EF 55-60%, no rwma, Gr1 DD, nl RV size/fxn.   Gallstones    GERD (gastroesophageal reflux disease)    Hydrocephalus (HCC)    [facility]    Hyperglycemia  Hyperlipidemia    Hypertension    Lymphocytosis    monoclonal b cell   Migraine, unspecified, without mention of intractable migraine without mention of status migrainosus 12/14/2012   Osteoarthritis, chronic    Osteoporosis    Peptic  ulcer    some bleeding with ulcers as well      Past Surgical History:  Procedure Laterality Date   ABDOMINAL HYSTERECTOMY     BREAST CYST ASPIRATION Left 1993   Removed   breast cyst removed Right    benign    BREAST LUMPECTOMY Bilateral    COLONOSCOPY     ERCP N/A 02/04/2018   Procedure: ENDOSCOPIC RETROGRADE CHOLANGIOPANCREATOGRAPHY (ERCP);  Surgeon: Lucilla Lame, MD;  Location[facility];  Service: Endoscopy;  Laterality: N/A;   ERCP N/A 02/28/2018   Procedure: ENDOSCOPIC RETROGRADE CHOLANGIOPANCREATOGRAPHY (ERCP) STENT REMOVAL;  Surgeon: Lucilla Lame, MD;  Location: [facility] ENDOSCOPY;  Service: Endoscopy;  Laterality: N/A;   EYE SURGERY     growth removal Left 2019   L wrist growth removed, abnormal cells   History of Cataract surgery Right 2011   left 2011   PARTIAL HYSTERECTOMY     POLYPECTOMY     ROBOTIC ASSISTED LAPAROSCOPIC CHOLECYSTECTOMY-MULTI SITE N/A 03/08/2018   Procedure: ROBOTIC ASSISTED LAPAROSCOPIC CHOLECYSTECTOMY-MULTI SITE;  Surgeon: Jules Husbands, MD;  Location: [facility] ORS;  Service: General;  Laterality: N/A;   S/P ACL repair Left    knee   TONSILLECTOMY     TUBAL LIGATION  1975   Bilateral    Social History   Socioeconomic History   Marital status: Divorced    Spouse name: Not on file   Number of children: 1   Years of education: college   Highest education level: Not on file  Occupational History   Occupation: book Production designer, theatre/television/film: OTHER    Comment: Paediatric nurse  Tobacco Use   Smoking status: Never   Smokeless tobacco: Never  Vaping Use   Vaping Use: Never used  Substance and Sexual Activity   Alcohol use: Yes    Alcohol/week: 1.0 - 3.0 standard drink    Types: 1 - 3 Glasses of wine per week   Drug use: No   Sexual activity: Not Currently  Other Topics Concern   Not on file  Social History Narrative   Patient is right handed, resides with daughter and grandson in her home. Divorced.      Social Determinants of Health    Financial Resource Strain: Not on file  Food Insecurity: Not on file  Transportation Needs: Not on file  Physical Activity: Not on file  Stress: Not on file  Social Connections: Not on file  Intimate Partner Violence: Not on file    Family History  Problem Relation Age of Onset   Colon cancer Mother    Hypertension Mother    Colon cancer Father    Heart disease Father    Hypertension Father    Colon polyps Neg Hx    Rectal cancer Neg Hx    Stomach cancer Neg Hx      Current Outpatient Medications:    ALPRAZolam (XANAX) 0.25 MG tablet, Take 1-2 tablets (0.25-0.5 mg total) by mouth at bedtime as needed for anxiety., Disp: 180 tablet, Rfl: 1   aspirin 81 MG tablet, Take 81 mg by mouth daily., Disp: , Rfl:    carvedilol (COREG) 12.5 MG tablet, TAKE 1 TABLET(12.5 MG) BY MOUTH TWICE DAILY, Disp: 180 tablet, Rfl: 0   Cholecalciferol (  VITAMIN D3) 1.25 MG (50000 UT) CAPS, Take 1 capsule by mouth once a week., Disp: 12 capsule, Rfl: 3   colestipol (COLESTID) 1 g tablet, TAKE 1 TABLET(1 GRAM) BY MOUTH TWICE DAILY, Disp: 60 tablet, Rfl: 5   cyclobenzaprine (FLEXERIL) 10 MG tablet, Take 1 tablet (10 mg total) by mouth at bedtime as needed for muscle spasms., Disp: 90 tablet, Rfl: 3   donepezil (ARICEPT) 10 MG tablet, Take 1 tablet (10 mg total) by mouth at bedtime., Disp: 90 tablet, Rfl: 4   ezetimibe (ZETIA) 10 MG tablet, Take 1 tablet (10 mg total) by mouth at bedtime., Disp: 90 tablet, Rfl: 2   gabapentin (NEURONTIN) 600 MG tablet, Take 1 tablet (600 mg total) by mouth 2 (two) times daily., Disp: 180 tablet, Rfl: 2   HYDROcodone-acetaminophen (NORCO/VICODIN) 5-325 MG tablet, Take 1 tablet by mouth every 6 (six) hours as needed for moderate pain., Disp: 90 tablet, Rfl: 0   levothyroxine (SYNTHROID) 112 MCG tablet, Take 1 tablet (112 mcg total) by mouth daily before breakfast., Disp: 90 tablet, Rfl: 3   losartan (COZAAR) 50 MG tablet, Take 1 tablet (50 mg total) by mouth at bedtime., Disp:  90 tablet, Rfl: 3   Magnesium 250 MG TABS, Take 1 tablet by mouth daily., Disp: , Rfl:    potassium chloride SA (K-DUR) 20 MEQ tablet, TAKE 1 TABLET BY MOUTH TWICE DAILY, Disp: 180 tablet, Rfl: 1   solifenacin (VESICARE) 10 MG tablet, Take 1 tablet (10 mg total) by mouth daily., Disp: 90 tablet, Rfl: 3   triamterene-hydrochlorothiazide (MAXZIDE) 75-50 MG tablet, Take 0.5 tablets by mouth daily., Disp: 90 tablet, Rfl: 3   verapamil (VERELAN PM) 120 MG 24 hr capsule, TAKE 1 CAPSULE BY MOUTH EVERY DAY. MUST HAVE APPOINTMENT FOR FURTHER REFILLS, Disp: 90 capsule, Rfl: 0   zolpidem (AMBIEN) 10 MG tablet, Take 1 tablet (10 mg total) by mouth at bedtime., Disp: 90 tablet, Rfl: 1  Physical exam:  Vitals:   12/16/20 1103  BP: (!) 161/67  Pulse: 64  Resp: 20  Temp: (!) 96.7 F (35.9 C)  TempSrc: Tympanic  SpO2: 98%  Weight: 200 lb (90.7 kg)   Physical Exam Constitutional:      General: She is not in acute distress. Cardiovascular:     Rate and Rhythm: Normal rate and regular rhythm.     Heart sounds: Normal heart sounds.  Pulmonary:     Effort: Pulmonary effort is normal.     Breath sounds: Normal breath sounds.  Abdominal:     General: Bowel sounds are normal.     Palpations: Abdomen is soft.  Skin:    General: Skin is warm and dry.  Neurological:     Mental Status: She is alert and oriented to person, place, and time.     CMP Latest Ref Rng & Units 12/16/2020  Glucose 70 - 99 mg/dL 118(H)  BUN 8 - 23 mg/dL 18  Creatinine 0.44 - 1.00 mg/dL 1.17(H)  Sodium 135 - 145 mmol/L 136  Potassium 3.5 - 5.1 mmol/L 3.6  Chloride 98 - 111 mmol/L 99  CO2 22 - 32 mmol/L 28  Calcium 8.9 - 10.3 mg/dL 9.0  Total Protein 6.5 - 8.1 g/dL 6.8  Total Bilirubin 0.3 - 1.2 mg/dL 0.2(L)  Alkaline Phos 38 - 126 U/L 79  AST 15 - 41 U/L 22  ALT 0 - 44 U/L 21   CBC EXTENDED Latest Ref Rng & Units 12/16/2020 07/01/2020 12/15/2019  WBC 4.0 - 10.5  K/uL 17.2(H) 15.9(H) 15.1(H)  RBC 3.87 - 5.11 MIL/uL 4.44  4.46 4.46  HGB 12.0 - 15.0 g/dL 13.0 13.2 13.2  HCT 36.0 - 46.0 % 39.5 39.5 39.2  PLT 150 - 400 K/uL 259 310 253  NEUTROABS 1.7 - 7.7 K/uL 4.3 4.4 3.9  LYMPHSABS 0.7 - 4.0 K/uL 9.5(H) 8.8(H) 9.2(H)      Assessment and plan- Patient is a 80 y.o. female with Rai stage 0 CLL here for routine follow-up   Clinically she is doing well without evidence of B symptoms and no palpable adenopathy today. She has mild leukocytosis which has been between 12-15 but today has increased to 17.2. Her absolute lymphocyte count is roughly stable but trending up. Recommend continued but close surveillance to see ifthis is a response to recent infections vs CLL. No treatment for CLL at this time.   Eosinophilia- chronic. Stable.   Monocytosis- chronic. 2.1 today. Monitor.   6 months- Labs (cbc, cmp, ldh) and see Dr. [name]   Visit Diagnosis 1. CLL (chronic lymphocytic leukemia) ([place])   2. Eosinophilia, unspecified type    Beckey Rutter, DNP, AGNP-C [place] at [facility] [phone] (clinic) 12/16/2020 1:30 PM

## 2021-01-02 ENCOUNTER — Other Ambulatory Visit: Payer: Self-pay

## 2021-01-02 DIAGNOSIS — K591 Functional diarrhea: Secondary | ICD-10-CM

## 2021-01-02 MED ORDER — COLESTIPOL HCL 1 G PO TABS: ORAL_TABLET | ORAL | 5 refills | Status: DC

## 2021-01-02 NOTE — Telephone Encounter (Signed)
LOV:  07/25/2020 NOV: 01/28/2021   Last Refill:   07/10/2020 #[id number] Refills.

## 2021-01-02 NOTE — Telephone Encounter (Signed)
[facility] faxed refill request for the following medications:  colestipol (COLESTID) 1 g tablet   Please advise.

## 2021-01-23 ENCOUNTER — Other Ambulatory Visit: Payer: Self-pay | Admitting: *Deleted

## 2021-01-23 ENCOUNTER — Telehealth: Payer: Self-pay | Admitting: Nurse Practitioner

## 2021-01-23 MED ORDER — CARVEDILOL 12.5 MG PO TABS: 12.5000 mg | ORAL_TABLET | Freq: Two times a day (BID) | ORAL | 0 refills | Status: DC

## 2021-01-23 NOTE — Telephone Encounter (Signed)
*  STAT* If patient is at the pharmacy, call can be transferred to refill team.   1. Which medications need to be refilled? (please list name of each medication and dose if known) Carvedilol 12.5mg  1 tablet twice a day  2. Which pharmacy/location (including street and city if local pharmacy) is medication to be sent to? [facility],   3. Do they need a 30 day or 90 day supply? 90 day

## 2021-01-23 NOTE — Telephone Encounter (Signed)
Rx sent to pharmacy as requested.

## 2021-01-24 ENCOUNTER — Emergency Department
Admission: EM | Admit: 2021-01-24 | Discharge: 2021-01-24 | Disposition: A | Discharge status: Home / Self Care | Payer: Medicare PPO | Attending: Emergency Medicine | Admitting: Emergency Medicine

## 2021-01-24 ENCOUNTER — Emergency Department: Payer: Medicare PPO

## 2021-01-24 ENCOUNTER — Other Ambulatory Visit: Payer: Self-pay

## 2021-01-24 ENCOUNTER — Ambulatory Visit: Payer: Self-pay | Admitting: *Deleted

## 2021-01-24 ENCOUNTER — Encounter: Payer: Self-pay | Admitting: Emergency Medicine

## 2021-01-24 DIAGNOSIS — Z7982 Long term (current) use of aspirin: Secondary | ICD-10-CM | POA: Insufficient documentation

## 2021-01-24 DIAGNOSIS — I82442 Acute embolism and thrombosis of left tibial vein: Secondary | ICD-10-CM | POA: Diagnosis not present

## 2021-01-24 DIAGNOSIS — E039 Hypothyroidism, unspecified: Secondary | ICD-10-CM | POA: Diagnosis not present

## 2021-01-24 DIAGNOSIS — Z91040 Latex allergy status: Secondary | ICD-10-CM | POA: Diagnosis not present

## 2021-01-24 DIAGNOSIS — I82402 Acute embolism and thrombosis of unspecified deep veins of left lower extremity: Secondary | ICD-10-CM | POA: Diagnosis not present

## 2021-01-24 DIAGNOSIS — I10 Essential (primary) hypertension: Secondary | ICD-10-CM | POA: Diagnosis not present

## 2021-01-24 DIAGNOSIS — Z8616 Personal history of COVID-19: Secondary | ICD-10-CM | POA: Insufficient documentation

## 2021-01-24 DIAGNOSIS — M7989 Other specified soft tissue disorders: Secondary | ICD-10-CM | POA: Diagnosis present

## 2021-01-24 DIAGNOSIS — Z79899 Other long term (current) drug therapy: Secondary | ICD-10-CM | POA: Diagnosis not present

## 2021-01-24 DIAGNOSIS — I82432 Acute embolism and thrombosis of left popliteal vein: Secondary | ICD-10-CM

## 2021-01-24 DIAGNOSIS — R2242 Localized swelling, mass and lump, left lower limb: Secondary | ICD-10-CM | POA: Diagnosis not present

## 2021-01-24 DIAGNOSIS — I82452 Acute embolism and thrombosis of left peroneal vein: Secondary | ICD-10-CM | POA: Diagnosis not present

## 2021-01-24 MED ORDER — APIXABAN (ELIQUIS) VTE STARTER PACK (10MG AND 5MG): ORAL_TABLET | ORAL | 0 refills | Status: DC

## 2021-01-24 NOTE — Telephone Encounter (Signed)
Patient calling with new onset swelling in ankle and calf- started yesterday. Pain in calf to touch and walk. Call to office- no appointment available- advised UC.

## 2021-01-24 NOTE — ED Notes (Signed)
See triage note  presents with pain and swelling to left lower leg   sent over from PCP

## 2021-01-24 NOTE — ED Provider Notes (Signed)
[facility] Emergency Department Provider Note  Time seen: 10:46 AM  I have reviewed the triage vital signs and the nursing notes.   HISTORY  Chief Complaint Leg Swelling   HPI Cathy Barnes is a 80 y.o. female with a past medical history of chest pain, CHF, gastric reflux, hypertension, hyperlipidemia, presents to the emergency department for left leg discomfort and swelling.  According to the patient since yesterday she has had mild swelling of the left leg and today noticed would look like mild redness and some discomfort in her calf.  Denies any history of blood clots.  No fever.  No recent immobilization, surgeries, long trips, etc.  No chest pain or trouble breathing.   Past Medical History:  Diagnosis Date   Allergy    some   Cataract    bilaterally both eyes    Chest pain    a. 07/2019 MV: EF 77%, no ischemia/infarct.   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 08/2019 Echo: EF 55-60%, no rwma, Gr1 DD, nl RV size/fxn.   Gallstones    GERD (gastroesophageal reflux disease)    Hydrocephalus (HCC)    [facility]    Hyperglycemia    Hyperlipidemia    Hypertension    Lymphocytosis    monoclonal b cell   Migraine, unspecified, without mention of intractable migraine without mention of status migrainosus 12/14/2012   Osteoarthritis, chronic    Osteoporosis    Peptic ulcer    some bleeding with ulcers as well     Patient Active Problem List   Diagnosis Date Noted   COVID-19 11/18/2020   DDD (degenerative disc disease), thoracolumbar 07/01/2020   Degenerative arthropathy of spinal facet joint 07/01/2020   Intercostal neuralgia 06/26/2020   Chronic right-sided thoracic back pain 06/26/2020   Functional diarrhea 11/02/2019   Acute midline low back pain without sciatica 11/02/2019   Bilateral hand pain 11/02/2019   Chronic cholecystitis    Abnormal computed tomography of large intestine    Benign  neoplasm of ascending colon    Neoplasm of digestive system    Presence of other specified functional implants    Encounter for fitting and adjustment of other gastrointestinal appliance and device    Calculus of common duct    Abnormal findings on diagnostic imaging of gall bladder    FHx: dementia 07/06/2017   CLL (chronic lymphocytic leukemia) ([place]) 11/23/2016   Cellulitis 08/22/2015   Monoarthritis, ankle and foot 08/16/2015   Overactive bladder 05/30/2015   Mild cognitive impairment 02/07/2015   Bloodgood disease 12/25/2014   Colon polyp 12/25/2014   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   Cramps of lower extremity 12/25/2014   Abnormal cerebrospinal fluid 12/25/2014   Narrowing of intervertebral disc space 12/25/2014   Contracture of palmar fascia (Dupuytren's) 12/25/2014   Breath shortness 12/25/2014   Essential (primary) hypertension 12/25/2014   Benign essential tremor 12/25/2014   Abnormal gait 12/25/2014   Hypercholesteremia 12/25/2014   Blood glucose elevated 12/25/2014   Decreased potassium in the blood 12/25/2014   Hypothyroidism 12/25/2014   Insomnia 12/25/2014   Headache, migraine, intractable 12/25/2014   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[place]) 12/25/2014   Fibrositis 12/25/2014   Discoloration of nail 12/25/2014   Cervico-occipital neuralgia 12/25/2014   Arthritis sicca 12/25/2014   Burning or prickling sensation 12/25/2014   Post menopausal syndrome 12/25/2014   Avitaminosis D 12/25/2014   Migraine 12/14/2012    Past Surgical History:  Procedure Laterality Date   ABDOMINAL HYSTERECTOMY     BREAST CYST ASPIRATION Left 1993   Removed   breast cyst removed Right    benign    BREAST LUMPECTOMY Bilateral    COLONOSCOPY     ERCP N/A 02/04/2018   Procedure: ENDOSCOPIC RETROGRADE CHOLANGIOPANCREATOGRAPHY (ERCP);  Surgeon: Lucilla Lame, MD;  Location: [facility] ENDOSCOPY;  Service: Endoscopy;  Laterality: N/A;   ERCP N/A 02/28/2018   Procedure: ENDOSCOPIC  RETROGRADE CHOLANGIOPANCREATOGRAPHY (ERCP) STENT REMOVAL;  Surgeon: Lucilla Lame, MD;  Location: [facility] ENDOSCOPY;  Service: Endoscopy;  Laterality: N/A;   EYE SURGERY     growth removal Left 2019   L wrist growth removed, abnormal cells   History of Cataract surgery Right 2011   left 2011   PARTIAL HYSTERECTOMY     POLYPECTOMY     ROBOTIC ASSISTED LAPAROSCOPIC CHOLECYSTECTOMY-MULTI SITE N/A 03/08/2018   Procedure: ROBOTIC ASSISTED LAPAROSCOPIC CHOLECYSTECTOMY-MULTI SITE;  Surgeon: Jules Husbands, MD;  Location: [facility] ORS;  Service: General;  Laterality: N/A;   S/P ACL repair Left    knee   TONSILLECTOMY     TUBAL LIGATION  1975   Bilateral    Prior to Admission medications   Medication Sig Start Date End Date Taking? Authorizing Provider  ALPRAZolam (XANAX) 0.25 MG tablet Take 1-2 tablets (0.25-0.5 mg total) by mouth at bedtime as needed for anxiety. 07/25/20   Mar Daring, PA-C  aspirin 81 MG tablet Take 81 mg by mouth daily.    [provider]  carvedilol (COREG) 12.5 MG tablet Take 1 tablet (12.5 mg total) by mouth 2 (two) times daily with a meal. 01/23/21   Theora Gianotti, NP  Cholecalciferol (VITAMIN D3) 1.25 MG (50000 UT) CAPS Take 1 capsule by mouth once a week. 09/24/20   [name], MD  colestipol (COLESTID) 1 g tablet TAKE 1 TABLET(1 GRAM) BY MOUTH TWICE DAILY 01/02/21   [name], FNP  cyclobenzaprine (FLEXERIL) 10 MG tablet Take 1 tablet (10 mg total) by mouth at bedtime as needed for muscle spasms. 09/18/20   [name], MD  donepezil (ARICEPT) 10 MG tablet Take 1 tablet (10 mg total) by mouth at bedtime. 09/18/20   [name], MD  ezetimibe (ZETIA) 10 MG tablet Take 1 tablet (10 mg total) by mouth at bedtime. 06/26/20   Mar Daring, PA-C  gabapentin (NEURONTIN) 600 MG tablet Take 1 tablet (600 mg total) by mouth 2 (two) times daily. 07/25/20   Mar Daring, PA-C  HYDROcodone-acetaminophen (NORCO/VICODIN) 5-325 MG tablet  Take 1 tablet by mouth every 6 (six) hours as needed for moderate pain. 07/25/20   Mar Daring, PA-C  levothyroxine (SYNTHROID) 112 MCG tablet Take 1 tablet (112 mcg total) by mouth daily before breakfast. 07/25/20   Mar Daring, PA-C  losartan (COZAAR) 50 MG tablet Take 1 tablet (50 mg total) by mouth at bedtime. 07/25/20   Mar Daring, PA-C  Magnesium 250 MG TABS Take 1 tablet by mouth daily.    [provider]  potassium chloride SA (K-DUR) 20 MEQ tablet TAKE 1 TABLET BY MOUTH TWICE DAILY 01/02/19   Mar Daring, PA-C  solifenacin (VESICARE) 10 MG tablet Take 1 tablet (10 mg total) by mouth daily. 07/25/20   Mar Daring, PA-C  triamterene-hydrochlorothiazide (MAXZIDE) 75-50 MG tablet Take 0.5 tablets by mouth daily. 07/25/20   Mar Daring, PA-C  verapamil (VERELAN PM) 120 MG 24 hr capsule TAKE 1 CAPSULE BY  MOUTH EVERY DAY. MUST HAVE APPOINTMENT FOR FURTHER REFILLS 09/18/20   [name], MD  zolpidem (AMBIEN) 10 MG tablet Take 1 tablet (10 mg total) by mouth at bedtime. 07/25/20   Mar Daring, PA-C    Allergies  Allergen Reactions   Shellfish Allergy     Throat closes, rash   Latex Rash    Tapes,adhesives    Family History  Problem Relation Age of Onset   Colon cancer Mother    Hypertension Mother    Colon cancer Father    Heart disease Father    Hypertension Father    Colon polyps Neg Hx    Rectal cancer Neg Hx    Stomach cancer Neg Hx     Social History Social History   Tobacco Use   Smoking status: Never   Smokeless tobacco: Never  Vaping Use   Vaping Use: Never used  Substance Use Topics   Alcohol use: Yes    Alcohol/week: 1.0 - 3.0 standard drink    Types: 1 - 3 Glasses of wine per week   Drug use: No    Review of Systems Constitutional: Negative for fever. Cardiovascular: Negative for chest pain. Respiratory: Negative for shortness of breath. Gastrointestinal: Negative for abdominal pain,  vomiting Musculoskeletal: Mild left leg discomfort mild swelling, mild redness Skin: Mild redness of the left leg. Neurological: Negative for headache All other ROS negative  ____________________________________________   PHYSICAL EXAM:  VITAL SIGNS: ED Triage Vitals  Enc Vitals Group     BP 01/24/21 1043 (!) 161/110     Pulse Rate 01/24/21 1043 72     Resp 01/24/21 1043 20     Temp 01/24/21 1043 98.1 F (36.7 C)     Temp Source 01/24/21 1043 Oral     SpO2 01/24/21 1043 97 %     Weight 01/24/21 1037 199 lb 15.3 oz (90.7 kg)     Height 01/24/21 1037 5' 4.5" (1.638 m)     Head Circumference --      Peak Flow --      Pain Score 01/24/21 1036 4     Pain Loc --      Pain Edu? --      Excl. in [place]? --    Constitutional: Alert and oriented. Well appearing and in no distress. Eyes: Normal exam ENT      Head: Normocephalic and atraumatic.      Mouth/Throat: Mucous membranes are moist. Cardiovascular: Normal rate, regular rhythm.  Respiratory: Normal respiratory effort without tachypnea nor retractions. Breath sounds are clear  Gastrointestinal: Soft and nontender. No distention. Musculoskeletal: Patient does have mild swelling to left lower extremity, no swelling to the right lower extremity.  Mild calf tenderness and popliteal fossa tenderness left lower extremity with mild erythema of the left lower extremity as well.  Neurovascularly intact distally. Neurologic:  Normal speech and language. No gross focal neurologic deficits  Skin:  Skin is warm, dry and intact.  Slight erythema of the left lower extremity Psychiatric: Mood and affect are normal.   ____________________________________________   RADIOLOGY  Ultrasound is positive for DVT.  ____________________________________________   INITIAL IMPRESSION / ASSESSMENT AND PLAN / ED COURSE  Pertinent labs & imaging results that were available during my care of the patient were reviewed by me and considered in my medical  decision making (see chart for details).   Patient presents emergency department for left lower extremity swelling discomfort mild erythema.  Examination is most consistent/concerning for deep  venous thrombosis.  We will obtain an ultrasound to further evaluate.  Reassuringly patient has no chest pain, pleuritic pain or shortness of breath.  Ultrasound positive for DVT.  Appears to be popliteal and distal.  I referred to the patient's past blood work fairly normal GFR.  We will place on Eliquis and have the patient follow-up with her doctor this coming week.  Patient agreeable to plan of care.  JALEEA ALESI was evaluated in Emergency Department on 01/24/2021 for the symptoms described in the history of present illness. She was evaluated in the context of the global COVID-19 pandemic, which necessitated consideration that the patient might be at risk for infection with the SARS-CoV-2 virus that causes COVID-19. Institutional protocols and algorithms that pertain to the evaluation of patients at risk for COVID-19 are in a state of rapid change based on information released by regulatory bodies including the CDC and federal and state organizations. These policies and algorithms were followed during the patient's care in the ED.  ____________________________________________   FINAL CLINICAL IMPRESSION(S) / ED DIAGNOSES  Left leg discomfort Deep venous thrombosis   Harvest Dark, MD 01/24/21 1252

## 2021-01-24 NOTE — Telephone Encounter (Signed)
FYI

## 2021-01-24 NOTE — ED Triage Notes (Signed)
C/O left lower calf pain and swelling x 1 day.  Sent to ED from [facility] for evaluation.  AAOx3.  Skin warm and dry. NAD

## 2021-01-24 NOTE — Telephone Encounter (Signed)
Reason for Disposition  [1] Thigh, calf, or ankle swelling AND [2] only 1 side  Answer Assessment - Initial Assessment Questions 1. ONSET: "When did the swelling start?" (e.g., minutes, hours, days)     Yesterday in the left ankle- now has moved up into the calf 2. LOCATION: "What part of the leg is swollen?"  "Are both legs swollen or just one leg?"     Left ankle and up into the caf 3. SEVERITY: "How bad is the swelling?" (e.g., localized; mild, moderate, severe)  - Localized - small area of swelling localized to one leg  - MILD pedal edema - swelling limited to foot and ankle, pitting edema < 1/4 inch (6 mm) deep, rest and elevation eliminate most or all swelling  - MODERATE edema - swelling of lower leg to knee, pitting edema > 1/4 inch (6 mm) deep, rest and elevation only partially reduce swelling  - SEVERE edema - swelling extends above knee, facial or hand swelling present      moderate 4. REDNESS: "Does the swelling look red or infected?"     No redness 5. PAIN: "Is the swelling painful to touch?" If Yes, ask: "How painful is it?"   (Scale 1-10; mild, moderate or severe)     Yes- in the calf 6. FEVER: "Do you have a fever?" If Yes, ask: "What is it, how was it measured, and when did it start?"      no 7. CAUSE: "What do you think is causing the leg swelling?"     Possible clot 8. MEDICAL HISTORY: "Do you have a history of heart failure, kidney disease, liver failure, or cancer?"     no 9. RECURRENT SYMPTOM: "Have you had leg swelling before?" If Yes, ask: "When was the last time?" "What happened that time?"     no 10. OTHER SYMPTOMS: "Do you have any other symptoms?" (e.g., chest pain, difficulty breathing)       No 11. PREGNANCY: "Is there any chance you are pregnant?" "When was your last menstrual period?"       N/a  Protocols used: Leg Swelling and Edema-A-AH

## 2021-01-28 ENCOUNTER — Encounter: Payer: Self-pay | Admitting: Family Medicine

## 2021-01-28 ENCOUNTER — Other Ambulatory Visit: Payer: Self-pay | Admitting: Nurse Practitioner

## 2021-01-28 ENCOUNTER — Ambulatory Visit: Payer: Medicare PPO | Admitting: Family Medicine

## 2021-01-28 ENCOUNTER — Other Ambulatory Visit: Payer: Self-pay

## 2021-01-28 VITALS — BP 150/82 | HR 68 | Temp 97.6°F | Resp 16 | Wt 197.9 lb

## 2021-01-28 DIAGNOSIS — I10 Essential (primary) hypertension: Secondary | ICD-10-CM | POA: Diagnosis not present

## 2021-01-28 DIAGNOSIS — F5101 Primary insomnia: Secondary | ICD-10-CM

## 2021-01-28 DIAGNOSIS — Z23 Encounter for immunization: Secondary | ICD-10-CM | POA: Diagnosis not present

## 2021-01-28 DIAGNOSIS — E034 Atrophy of thyroid (acquired): Secondary | ICD-10-CM | POA: Diagnosis not present

## 2021-01-28 DIAGNOSIS — I824Y2 Acute embolism and thrombosis of unspecified deep veins of left proximal lower extremity: Secondary | ICD-10-CM | POA: Insufficient documentation

## 2021-01-28 MED ORDER — ALPRAZOLAM 0.25 MG PO TABS: 0.2500 mg | ORAL_TABLET | Freq: Every day | ORAL | 0 refills | Status: DC | PRN

## 2021-01-28 MED ORDER — LEVOTHYROXINE SODIUM 112 MCG PO TABS: 112.0000 ug | ORAL_TABLET | Freq: Every day | ORAL | 3 refills | Status: DC

## 2021-01-28 MED ORDER — VERAPAMIL HCL ER 120 MG PO CP24: ORAL_CAPSULE | ORAL | 3 refills | Status: DC

## 2021-01-28 MED ORDER — APIXABAN 5 MG PO TABS: 5.0000 mg | ORAL_TABLET | Freq: Two times a day (BID) | ORAL | 0 refills | Status: DC

## 2021-01-28 NOTE — Telephone Encounter (Signed)
This is a  pt 

## 2021-01-28 NOTE — Progress Notes (Signed)
Established patient visit   [name]   DOB: 09-04-1940   80 y.o. Female  MRN: [id number] Visit Date: 01/28/2021  Today's healthcare provider: [name], FNP   Chief Complaint  Patient presents with   Back Pain   Hypothyroidism   Hypertension   Insomnia   Subjective    HPI  Follow up for Back Pain   The patient was last seen for this 6 months ago. Changes made at last visit include NONE.  She reports excellent compliance with treatment. She feels that condition is Unchanged. She is not having side effects.   -----------------------------------------------------------------------------------------  Hypothyroid, follow-up  Lab Results  Component Value Date   TSH 2.621 07/01/2020   TSH 11.600 (H) 07/26/2019   TSH 4.740 (H) 09/23/2017   FREET4 1.14 07/26/2019   T4TOTAL 8.4 07/01/2020   Wt Readings from Last 3 Encounters:  01/28/21 197 lb 14.4 oz (89.8 kg)  01/24/21 199 lb 15.3 oz (90.7 kg)  12/16/20 200 lb (90.7 kg)    She was last seen for hypothyroid 6 months ago.  Management since that visit includes none stable on Synthroid 112. She reports good compliance with treatment. She is not having side effects.  Symptoms: No change in energy level No constipation  No diarrhea No heat / cold intolerance  No nervousness No palpitations  No weight changes    -----------------------------------------------------------------------------------------  Hypertension, follow-up  BP Readings from Last 3 Encounters:  01/28/21 (!) 150/82  01/24/21 (!) 161/110  12/16/20 (!) 161/67   Wt Readings from Last 3 Encounters:  01/28/21 197 lb 14.4 oz (89.8 kg)  01/24/21 199 lb 15.3 oz (90.7 kg)  12/16/20 200 lb (90.7 kg)     She was last seen for hypertension 6 months ago.  BP at that visit was 167/67. Management since that visit includes none stable.  She reports excellent compliance with treatment. She is not having side effects.  She is  following a Regular diet. She is not exercising. She does not smoke.  Use of agents associated with hypertension: none.   Outside blood pressures are patient states recent blood pressure reading of 180/84. Symptoms: No chest pain No chest pressure  No palpitations No syncope  No dyspnea No orthopnea  No paroxysmal nocturnal dyspnea No lower extremity edema   Pertinent labs: Lab Results  Component Value Date   CHOL 183 07/01/2020   HDL 48 07/01/2020   LDLCALC 100 (H) 07/01/2020   TRIG 173 (H) 07/01/2020   CHOLHDL 3.8 07/01/2020   Lab Results  Component Value Date   NA 136 12/16/2020   K 3.6 12/16/2020   CREATININE 1.17 (H) 12/16/2020   GFRNONAA 48 (L) 12/16/2020   GLUCOSE 118 (H) 12/16/2020     The 10-year ASCVD risk score (Arnett DK, et al., 2019) is: 36.2%   ---------------------------------------------------------------------------------------------------  Follow up for Insomnia  The patient was last seen for this 6 months ago. Changes made at last visit include none.  She reports excellent compliance with treatment. She feels that condition is Unchanged. She is not having side effects.   -----------------------------------------------------------------------------------------   Medications: Outpatient Medications Prior to Visit  Medication Sig   aspirin 81 MG tablet Take 81 mg by mouth daily.   carvedilol (COREG) 12.5 MG tablet Take 1 tablet (12.5 mg total) by mouth 2 (two) times daily with a meal.   Cholecalciferol (VITAMIN D3) 1.25 MG (50000 UT) CAPS Take 1 capsule by mouth once a week.  colestipol (COLESTID) 1 g tablet TAKE 1 TABLET(1 GRAM) BY MOUTH TWICE DAILY   cyclobenzaprine (FLEXERIL) 10 MG tablet Take 1 tablet (10 mg total) by mouth at bedtime as needed for muscle spasms.   donepezil (ARICEPT) 10 MG tablet Take 1 tablet (10 mg total) by mouth at bedtime.   ezetimibe (ZETIA) 10 MG tablet Take 1 tablet (10 mg total) by mouth at bedtime.   gabapentin  (NEURONTIN) 600 MG tablet Take 1 tablet (600 mg total) by mouth 2 (two) times daily.   HYDROcodone-acetaminophen (NORCO/VICODIN) 5-325 MG tablet Take 1 tablet by mouth every 6 (six) hours as needed for moderate pain.   losartan (COZAAR) 50 MG tablet Take 1 tablet (50 mg total) by mouth at bedtime.   Magnesium 250 MG TABS Take 1 tablet by mouth daily.   potassium chloride SA (K-DUR) 20 MEQ tablet TAKE 1 TABLET BY MOUTH TWICE DAILY   solifenacin (VESICARE) 10 MG tablet Take 1 tablet (10 mg total) by mouth daily.   triamterene-hydrochlorothiazide (MAXZIDE) 75-50 MG tablet Take 0.5 tablets by mouth daily.   zolpidem (AMBIEN) 10 MG tablet Take 1 tablet (10 mg total) by mouth at bedtime.   [DISCONTINUED] ALPRAZolam (XANAX) 0.25 MG tablet Take 1-2 tablets (0.25-0.5 mg total) by mouth at bedtime as needed for anxiety.   [DISCONTINUED] APIXABAN (ELIQUIS) VTE STARTER PACK (10MG  AND 5MG ) Take as directed on package: start with two-5mg  tablets twice daily for 7 days. On day 8, switch to one-5mg  tablet twice daily.   [DISCONTINUED] levothyroxine (SYNTHROID) 112 MCG tablet Take 1 tablet (112 mcg total) by mouth daily before breakfast.   [DISCONTINUED] verapamil (VERELAN PM) 120 MG 24 hr capsule TAKE 1 CAPSULE BY MOUTH EVERY DAY. MUST HAVE APPOINTMENT FOR FURTHER REFILLS   No facility-administered medications prior to visit.    Review of Systems     Objective    BP (!) 150/82   Pulse 68   Temp 97.6 F (36.4 C) (Oral)   Resp 16   Wt 197 lb 14.4 oz (89.8 kg)   BMI 33.44 kg/m    Physical Exam Vitals and nursing note reviewed.  Constitutional:      General: She is not in acute distress.    Appearance: Normal appearance. She is obese. She is not ill-appearing, toxic-appearing or diaphoretic.  HENT:     Head: Normocephalic and atraumatic.  Cardiovascular:     Rate and Rhythm: Normal rate and regular rhythm.     Pulses: Normal pulses.     Heart sounds: Normal heart sounds. No murmur heard.   No  friction rub. No gallop.  Pulmonary:     Effort: Pulmonary effort is normal. No respiratory distress.     Breath sounds: Normal breath sounds. No stridor. No wheezing, rhonchi or rales.  Chest:     Chest wall: No tenderness.  Abdominal:     General: Bowel sounds are normal.     Palpations: Abdomen is soft.  Musculoskeletal:        General: No swelling, tenderness, deformity or signs of injury. Normal range of motion.     Right lower leg: No edema.     Left lower leg: Edema present.  Skin:    General: Skin is warm and dry.     Capillary Refill: Capillary refill takes less than 2 seconds.     Coloration: Skin is not jaundiced or pale.     Findings: Erythema present. No bruising, lesion or rash.  Neurological:     General: No  focal deficit present.     Mental Status: She is alert and oriented to person, place, and time. Mental status is at baseline.     Cranial Nerves: No cranial nerve deficit.     Sensory: No sensory deficit.     Motor: No weakness.     Coordination: Coordination normal.  Psychiatric:        Mood and Affect: Mood normal.        Behavior: Behavior normal.        Thought Content: Thought content normal.        Judgment: Judgment normal.     No results found for any visits on 01/28/21.  Assessment & Plan     Problem List Items Addressed This Visit       Cardiovascular and Mediastinum   Essential (primary) hypertension   Relevant Medications   verapamil (VERELAN PM) 120 MG 24 hr capsule   apixaban (ELIQUIS) 5 MG TABS tablet   Acute deep vein thrombosis (DVT) of proximal vein of left lower extremity (HCC) - Primary   Relevant Medications   verapamil (VERELAN PM) 120 MG 24 hr capsule   apixaban (ELIQUIS) 5 MG TABS tablet     Endocrine   Hypothyroidism   Relevant Medications   levothyroxine (SYNTHROID) 112 MCG tablet     Other   Insomnia   Relevant Medications   ALPRAZolam (XANAX) 0.25 MG tablet   Flu vaccine need   Relevant Orders   Flu Vaccine  QUAD High Dose(Fluad)     Return in about 3 months (around 04/30/2021) for chonic disease management.      Vonna Kotyk, FNP, have reviewed all documentation for this visit. The documentation on 01/28/21 for the exam, diagnosis, procedures, and orders are all accurate and complete.    [name], [place] [phone] (phone) [phone] (fax)  [place]

## 2021-01-28 NOTE — Assessment & Plan Note (Signed)
Chronic, unstable Given acute DVT- will provide some grace Will continue to monitor Has appt with Cards in December

## 2021-01-28 NOTE — Telephone Encounter (Signed)
Left message requesting to have patient call back to confirm which pharmacy patient wants Rx to go to. Sent to [facility] 5 days ago.

## 2021-01-28 NOTE — Assessment & Plan Note (Signed)
Onset <1 wk ago; doing well on Rx provided by ED Swelling and pain has gone down, not resolved Redness has decreased, not resolved Continue Rx for 3-6 months

## 2021-02-11 ENCOUNTER — Other Ambulatory Visit: Payer: Self-pay | Admitting: Neurology

## 2021-02-11 DIAGNOSIS — I10 Essential (primary) hypertension: Secondary | ICD-10-CM

## 2021-02-14 ENCOUNTER — Other Ambulatory Visit: Payer: Self-pay | Admitting: Family Medicine

## 2021-02-14 ENCOUNTER — Other Ambulatory Visit: Payer: Self-pay

## 2021-02-14 DIAGNOSIS — F5101 Primary insomnia: Secondary | ICD-10-CM

## 2021-02-14 NOTE — Telephone Encounter (Signed)
Medication Refill - Medication: Ambien 10 mg  Has the patient contacted their pharmacy? Yes.  She said they told her to call the office (Agent: If no, request that the patient contact the pharmacy for the refill. If patient does not wish to contact the pharmacy document the reason why and proceed with request.) (Agent: If yes, when and what did the pharmacy advise?)  Preferred Pharmacy (with phone number or street name): [facility] [place] Has the patient been seen for an appointment in the last year OR does the patient have an upcoming appointment? Yes.    Agent: Please be advised that RX refills may take up to 3 business days. We ask that you follow-up with your pharmacy.

## 2021-02-14 NOTE — Telephone Encounter (Signed)
Medication Refill - Medication: zolpidem (AMBIEN) 10 MG tablet     Has the patient contacted their pharmacy? Yes.   (Pt is switching to a new pharmacy and they told her to call  Preferred Pharmacy (with phone number or street name): [facility] [address] [facility] - [place], [place] - [address] AT [address] Has the patient been seen for an appointment in the last year OR does the patient have an upcoming appointment? Yes.    Agent: Please be advised that RX refills may take up to 3 business days. We ask that you follow-up with your pharmacy.

## 2021-02-15 NOTE — Telephone Encounter (Signed)
Requested medication (s) are due for refill today: yes  Requested medication (s) are on the active medication list: yes  Last refill: 07/25/20  #90  1 refill  Future visit scheduled no  Notes to clinic: not delegated  Requested Prescriptions  Pending Prescriptions Disp Refills   zolpidem (AMBIEN) 10 MG tablet 90 tablet 1    Sig: Take 1 tablet (10 mg total) by mouth at bedtime.     Not Delegated - Psychiatry:  Anxiolytics/Hypnotics Failed - 02/14/2021  3:08 PM      Failed - This refill cannot be delegated      Failed - Urine Drug Screen completed in last 360 days      Passed - Valid encounter within last 6 months    Recent Outpatient Visits           2 weeks ago Acute deep vein thrombosis (DVT) of proximal vein of left lower extremity [facility])   [facility] [name], FNP   2 months ago [place] Tally Joe T, FNP   7 months ago Acute midline low back pain without sciatica   [facility], Clearnce Sorrel, [place]   1 year ago Functional diarrhea   [facility] [place], Clearnce Sorrel, [place]   1 year ago Annual physical exam   [place], [place]       Future Appointments             In 7 months [name], MD [facility]

## 2021-02-17 MED ORDER — ZOLPIDEM TARTRATE 10 MG PO TABS: 10.0000 mg | ORAL_TABLET | Freq: Every day | ORAL | 1 refills | Status: DC

## 2021-03-10 ENCOUNTER — Other Ambulatory Visit: Payer: Self-pay | Admitting: Family Medicine

## 2021-03-10 DIAGNOSIS — G588 Other specified mononeuropathies: Secondary | ICD-10-CM

## 2021-03-10 DIAGNOSIS — I10 Essential (primary) hypertension: Secondary | ICD-10-CM

## 2021-03-10 DIAGNOSIS — E559 Vitamin D deficiency, unspecified: Secondary | ICD-10-CM

## 2021-03-10 NOTE — Telephone Encounter (Signed)
Medication Refill - Medication: triamterene-hydrochlorothiazide (MAXZIDE) 75-50 MG tablet  gabapentin (NEURONTIN) 600 MG tablet   Cholecalciferol (VITAMIN D3) 1.25 MG (50000 UT) CAPS Has the patient contacted their pharmacy? Yes.   (Agent: If no, request that the patient contact the pharmacy for the refill. If patient does not wish to contact the pharmacy document the reason why and proceed with request.) (Agent: If yes, when and what did the pharmacy advise?) Pt has changed Pharmacies and the Rx can not be transferred / call pcp   Preferred Pharmacy (with phone number or street name):  [facility] [address] [place], [place] [employer] AT [address] Phone:  [phone]  Fax:  [phone]     Has the patient been seen for an appointment in the last year OR does the patient have an upcoming appointment? Yes.    Agent: Please be advised that RX refills may take up to 3 business days. We ask that you follow-up with your pharmacy.

## 2021-03-10 NOTE — Telephone Encounter (Signed)
Requested medication (s) are due for refill today - no  Requested medication (s) are on the active medication list -yes  Future visit scheduled -no  Last refill: 07/25/20 #180 2RF                   Notes to clinic: Request RF: provider no longer in office, outside provider-non delegated Rx  Requested Prescriptions  Pending Prescriptions Disp Refills   gabapentin (NEURONTIN) 600 MG tablet 180 tablet 2    Sig: Take 1 tablet (600 mg total) by mouth 2 (two) times daily.     Neurology: Anticonvulsants - gabapentin Passed - 03/10/2021  1:56 PM      Passed - Valid encounter within last 12 months    Recent Outpatient Visits           1 month ago Acute deep vein thrombosis (DVT) of proximal vein of left lower extremity [facility])   [facility] [name], FNP   3 months ago [place] Tally Joe T, FNP   8 months ago Acute midline low back pain without sciatica   [facility], [place], [place]   1 year ago Functional diarrhea   [facility] [place], [place], [place]   1 year ago Annual physical exam   [facility] [place], [place], [place]       Future Appointments             In 6 months [name], MD [facility]             triamterene-hydrochlorothiazide (MAXZIDE) 75-50 MG tablet 90 tablet 3    Sig: Take 0.5 tablets by mouth daily.     Cardiovascular: Diuretic Combos Failed - 03/10/2021  1:56 PM      Failed - Cr in normal range and within 360 days    Creatinine, Ser  Date Value Ref Range Status  12/16/2020 1.17 (H) 0.44 - 1.00 mg/dL Final          Failed - Last BP in normal range    BP Readings from Last 1 Encounters:  01/28/21 (!) 150/82          Passed - K in normal range and within 360 days    Potassium  Date Value Ref Range Status  12/16/2020 3.6 3.5 - 5.1 mmol/L Final          Passed - Na in normal range and within 360 days     Sodium  Date Value Ref Range Status  12/16/2020 136 135 - 145 mmol/L Final  02/02/2018 138 134 - 144 mmol/L Final          Passed - Ca in normal range and within 360 days    Calcium  Date Value Ref Range Status  12/16/2020 9.0 8.9 - 10.3 mg/dL Final   Calcium, Ion  Date Value Ref Range Status  05/06/2010 1.17 1.12 - 1.32 mmol/L Final          Passed - Valid encounter within last 6 months    Recent Outpatient Visits           1 month ago Acute deep vein thrombosis (DVT) of proximal vein of left lower extremity [facility])   [facility] [name], FNP   3 months ago [place] Tally Joe T, FNP   8 months ago Acute midline low back pain without sciatica   [facility]  Mar Daring, PA-C   1 year ago Functional diarrhea   [place], Clearn[place] Sor[place], [place]   1 year ago Annual physical exam   [facility] [place], Clearn[place], [place]       Future Appointments             In 6 months [name], MD [facility]             Cholecalciferol (VITAMIN D3) 1.25 MG (50000 UT) CAPS 12 capsule 3    Sig: Take 1 capsule by mouth once a week.     Endocrinology:  Vitamins - Vitamin D Supplementation Failed - 03/10/2021  1:56 PM      Failed - 50,000 IU strengths are not delegated      Failed - Phosphate in normal range and within 360 days    No results found for: PHOS        Failed - Vitamin D in normal range and within 360 days    Vit D, 25-Hydroxy  Date Value Ref Range Status  07/26/2019 26.8 (L) 30.0 - 100.0 ng/mL Final    Comment:    Vitamin D deficiency has been defined by the Institute of Medicine and an Endocrine Society practice guideline as a level of serum 25-OH vitamin D less than 20 ng/mL (1,2). The Endocrine Society went on to further define vitamin D insufficiency as a level between 21 and 29 ng/mL (2). 1. IOM (Institute of Medicine).  2010. Dietary reference    intakes for calcium and D. [place]: The    [employer]. 2. Holick MF, Binkley [place], Bischoff-Ferrari HA, et al.    Evaluation, treatment, and prevention of vitamin D    deficiency: an Endocrine Society clinical practice    guideline. JCEM. 2011 Jul; 96(7):1911-30.           Passed - Ca in normal range and within 360 days    Calcium  Date Value Ref Range Status  12/16/2020 9.0 8.9 - 10.3 mg/dL Final   Calcium, Ion  Date Value Ref Range Status  05/06/2010 1.17 1.12 - 1.32 mmol/L Final          Passed - Valid encounter within last 12 months    Recent Outpatient Visits           1 month ago Acute deep vein thrombosis (DVT) of proximal vein of left lower extremity [facility])   [facility] [name], FNP   3 months ago [place] Tally Joe T, FNP   8 months ago Acute midline low back pain without sciatica   [facility], [place], [place]   1 year ago Functional diarrhea   [facility] [place], Cle[place], [place]   1 year ago Annual physical exam   [facility] [place], [place], [place]       Future Appointments             In 6 months [name], MD [facility]               Requested Prescriptions  Pending Prescriptions Disp Refills   gabapentin (NEURONTIN) 600 MG tablet 180 tablet 2    Sig: Take 1 tablet (600 mg total) by mouth 2 (two) times daily.     Neurology: Anticonvulsants - gabapentin Passed - 03/10/2021  1:56 PM      Passed - Valid encounter within last 12 months  Recent Outpatient Visits           1 month ago Acute deep vein thrombosis (DVT) of proximal vein of left lower extremity [facility])   [facility] [name], FNP   3 months ago [place] Tally Joe T, FNP   8 months ago Acute midline low back pain without sciatica   [facility], [place], [place]   1 year ago Functional diarrhea   [facility] [place], Cle[place], [place]   1 year ago Annual physical exam   [facility] [place], [place], [place]       Future Appointments             In 6 months [name], MD [facility]             triamterene-hydrochlorothiazide (MAXZIDE) 75-50 MG tablet 90 tablet 3    Sig: Take 0.5 tablets by mouth daily.     Cardiovascular: Diuretic Combos Failed - 03/10/2021  1:56 PM      Failed - Cr in normal range and within 360 days    Creatinine, Ser  Date Value Ref Range Status  12/16/2020 1.17 (H) 0.44 - 1.00 mg/dL Final          Failed - Last BP in normal range    BP Readings from Last 1 Encounters:  01/28/21 (!) 150/82          Passed - K in normal range and within 360 days    Potassium  Date Value Ref Range Status  12/16/2020 3.6 3.5 - 5.1 mmol/L Final          Passed - Na in normal range and within 360 days    Sodium  Date Value Ref Range Status  12/16/2020 136 135 - 145 mmol/L Final  02/02/2018 138 134 - 144 mmol/L Final          Passed - Ca in normal range and within 360 days    Calcium  Date Value Ref Range Status  12/16/2020 9.0 8.9 - 10.3 mg/dL Final   Calcium, Ion  Date Value Ref Range Status  05/06/2010 1.17 1.12 - 1.32 mmol/L Final          Passed - Valid encounter within last 6 months    Recent Outpatient Visits           1 month ago Acute deep vein thrombosis (DVT) of proximal vein of left lower extremity [facility])   [facility] [name], FNP   3 months ago [place] Tally Joe T, FNP   8 months ago Acute midline low back pain without sciatica   [facility] [place], Clearn[place], [place]   1 year ago Functional diarrhea   [facility] [place], Clearn[place], [place]   1 year ago Annual physical exam   [facility]  [place], Cle[place], [place]       Future Appointments             In 6 months [name], MD [facility]             Cholecalciferol (VITAMIN D3) 1.25 MG (50000 UT) CAPS 12 capsule 3    Sig: Take 1 capsule by mouth once a week.     Endocrinology:  Vitamins - Vitamin D Supplementation Failed - 03/10/2021  1:56 PM      Failed - 50,000  IU strengths are not delegated      Failed - Phosphate in normal range and within 360 days    No results found for: PHOS        Failed - Vitamin D in normal range and within 360 days    Vit D, 25-Hydroxy  Date Value Ref Range Status  07/26/2019 26.8 (L) 30.0 - 100.0 ng/mL Final    Comment:    Vitamin D deficiency has been defined by the [place] practice guideline as a level of serum 25-OH vitamin D less than 20 ng/mL (1,2). The Endocrine Society went on to further define vitamin D insufficiency as a level between 21 and 29 ng/mL (2). 1. IOM (Institute of Medicine). 2010. Dietary reference    intakes for calcium and D. [place]: The    [employer]. 2. Holick MF, Binkley [place], Bischoff-Ferrari HA, et al.    Evaluation, treatment, and prevention of vitamin D    deficiency: an Endocrine Society clinical practice    guideline. JCEM. 2011 Jul; 96(7):1911-30.           Passed - Ca in normal range and within 360 days    Calcium  Date Value Ref Range Status  12/16/2020 9.0 8.9 - 10.3 mg/dL Final   Calcium, Ion  Date Value Ref Range Status  05/06/2010 1.17 1.12 - 1.32 mmol/L Final          Passed - Valid encounter within last 12 months    Recent Outpatient Visits           1 month ago Acute deep vein thrombosis (DVT) of proximal vein of left lower extremity [facility])   [facility] [name], FNP   3 months ago [place] Tally Joe T, FNP   8 months ago Acute midline low back pain without sciatica   [facility], [place], [place]   1 year ago Functional diarrhea   [facility] [place], [place], [place]   1 year ago Annual physical exam   [place], [place]       Future Appointments             In 6 months [name], MD [facility]

## 2021-03-11 MED ORDER — VITAMIN D3 1.25 MG (50000 UT) PO CAPS: 1.0000 | ORAL_CAPSULE | ORAL | 3 refills | Status: DC

## 2021-03-11 MED ORDER — TRIAMTERENE-HCTZ 75-50 MG PO TABS: 0.5000 | ORAL_TABLET | Freq: Every day | ORAL | 3 refills | Status: DC

## 2021-03-11 MED ORDER — GABAPENTIN 600 MG PO TABS: 600.0000 mg | ORAL_TABLET | Freq: Two times a day (BID) | ORAL | 2 refills | Status: DC

## 2021-03-17 ENCOUNTER — Telehealth: Payer: Self-pay | Admitting: Family Medicine

## 2021-03-17 DIAGNOSIS — E7800 Pure hypercholesterolemia, unspecified: Secondary | ICD-10-CM

## 2021-03-17 NOTE — Telephone Encounter (Signed)
[facility] faxed refill request for the following medications:  ezetimibe (ZETIA) 10 MG tablet  Please advise.

## 2021-03-19 MED ORDER — EZETIMIBE 10 MG PO TABS
10.0000 mg | ORAL_TABLET | Freq: Every day | ORAL | 1 refills | Status: DC
Start: 2021-03-19 — End: 2021-09-29

## 2021-03-19 NOTE — Addendum Note (Signed)
Addended by: Minette Headland on: 03/19/2021 11:36 AM   Modules accepted: Orders

## 2021-04-24 ENCOUNTER — Other Ambulatory Visit: Payer: Self-pay

## 2021-04-24 MED ORDER — CARVEDILOL 12.5 MG PO TABS: 12.5000 mg | ORAL_TABLET | Freq: Two times a day (BID) | ORAL | 0 refills | Status: DC

## 2021-04-24 NOTE — Telephone Encounter (Signed)
*  STAT* If patient is at the pharmacy, call can be transferred to refill team.   1. Which medications need to be refilled? (please list name of each medication and dose if known) Carvedilol  2. Which pharmacy/location (including street and city if local pharmacy) is medication to be sent to? [facility]  3. Do they need a 30 day or 90 day supply? [place]

## 2021-05-06 ENCOUNTER — Other Ambulatory Visit: Payer: Self-pay | Admitting: Family Medicine

## 2021-05-06 DIAGNOSIS — I824Y2 Acute embolism and thrombosis of unspecified deep veins of left proximal lower extremity: Secondary | ICD-10-CM

## 2021-05-09 ENCOUNTER — Other Ambulatory Visit: Payer: Self-pay | Admitting: Family Medicine

## 2021-05-09 DIAGNOSIS — F5101 Primary insomnia: Secondary | ICD-10-CM

## 2021-05-09 NOTE — Telephone Encounter (Signed)
Requested medication (s) are due for refill today: Yes  Requested medication (s) are on the active medication list: Yes  Last refill:  01/28/21  Future visit scheduled: No  Notes to clinic:  See request.    Requested Prescriptions  Pending Prescriptions Disp Refills   ALPRAZolam (XANAX) 0.25 MG tablet [Pharmacy Med Name: ALPRAZOLAM 0.25 MG TAB] 90 tablet 0    Sig: TAKE ONE TABLET BY MOUTH ONE TIME DAILY AS NEEDED FOR ANXIETY     Not Delegated - Psychiatry:  Anxiolytics/Hypnotics Failed - 05/09/2021 11:19 AM      Failed - This refill cannot be delegated      Failed - Urine Drug Screen completed in last 360 days      Passed - Valid encounter within last 6 months    Recent Outpatient Visits           3 months ago Acute deep vein thrombosis (DVT) of proximal vein of left lower extremity [facility])   [facility] [name], FNP   5 months ago [place] Tally Joe T, FNP   10 months ago Acute midline low back pain without sciatica   [facility], [place], [place]   1 year ago Functional diarrhea   [facility] [place], [place], [place]   1 year ago Annual physical exam   [place], [place], [place]       Future Appointments             In 3 weeks [place] Anon, Mertie Clause, MD [facility], [place]   In 4 months [name], MD [facility]

## 2021-05-30 ENCOUNTER — Other Ambulatory Visit: Payer: Self-pay

## 2021-05-30 ENCOUNTER — Ambulatory Visit: Payer: Medicare PPO | Admitting: Cardiovascular Disease

## 2021-05-30 ENCOUNTER — Encounter: Payer: Self-pay | Admitting: Cardiovascular Disease

## 2021-05-30 VITALS — BP 140/70 | HR 64 | Ht 64.5 in | Wt 203.0 lb

## 2021-05-30 DIAGNOSIS — R0609 Other forms of dyspnea: Secondary | ICD-10-CM | POA: Diagnosis not present

## 2021-05-30 DIAGNOSIS — E785 Hyperlipidemia, unspecified: Secondary | ICD-10-CM

## 2021-05-30 DIAGNOSIS — I10 Essential (primary) hypertension: Secondary | ICD-10-CM

## 2021-05-30 MED ORDER — CARVEDILOL 12.5 MG PO TABS: 12.5000 mg | ORAL_TABLET | Freq: Two times a day (BID) | ORAL | 3 refills | Status: DC

## 2021-05-30 NOTE — Patient Instructions (Signed)
Medication Instructions:  Your physician recommends that you continue on your current medications as directed. Please refer to the Current Medication list given to you today.  Your Carvedilol has been refilled today.  *If you need a refill on your cardiac medications before your next appointment, please call your pharmacy*   Lab Work: None ordered If you have labs (blood work) drawn today and your tests are completely normal, you will receive your results only by: [place] (if you have MyChart) OR A paper copy in the mail If you have any lab test that is abnormal or we need to change your treatment, we will call you to review the results.   Testing/Procedures: None ordered   Follow-Up: At [facility], you and your health needs are our priority.  As part of our continuing mission to provide you with exceptional heart care, we have created designated Provider Care Teams.  These Care Teams include your primary Cardiologist (physician) and Advanced Practice Providers (APPs -  Physician Assistants and Nurse Practitioners) who all work together to provide you with the care you need, when you need it.  We recommend signing up for the patient portal called "MyChart".  Sign up information is provided on this After Visit Summary.  MyChart is used to connect with patients for Virtual Visits (Telemedicine).  Patients are able to view lab/test results, encounter notes, upcoming appointments, etc.  Non-urgent messages can be sent to your provider as well.   To learn more about what you can do with MyChart, go to [email or web address].    Your next appointment:   Your physician wants you to follow-up in: 1 year You will receive a reminder letter in the mail two months in advance. If you don't receive a letter, please call our office to schedule the follow-up appointment.   The format for your next appointment:   In Person  Provider:   You may see Kathlyn [place], MD or one of the  following Advanced Practice Providers on your designated Care Team:  Murray Hodgkins, NP Christell Faith, PA-C [place] Kathlen Mody, PA-C}    Other Instructions N/A

## 2021-05-30 NOTE — Progress Notes (Signed)
Cardiology Office Note   Date:  05/30/2021   ID:  Cathy Barnes, DOB 20-Apr-1941, MRN [id number]  PCP:  [place]  Cardiologist:   Kathlyn Sacramento, MD   Chief Complaint  Patient presents with   Follow-up    Follow up and medications verbally reviewed with patient.      History of Present Illness: Cathy Barnes is a 81 y.o. female who is here today for follow-up visit regarding shortness of breath.   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 She was seen by me in 2021 for dyspnea with minimal exertion and atypical right-sided chest discomfort.  She underwent a stress test which showed no evidence of ischemia or infarct.  Echocardiogram showed normal LV systolic function with grade 1 diastolic dysfunction no significant valvular abnormalities.  She presented to the ED in October with left leg pain and swelling.  She was found to have an occlusive DVT in the left popliteal vein, posterior tibial vein and peroneal vein.  She has been on Eliquis since then with no side effects.  She has been doing reasonably well and denies chest pain or worsening dyspnea.  Blood pressure is reasonably controlled at home.  Past Medical History:  Diagnosis Date   Allergy    some   Cataract    bilaterally both eyes    Chest pain    a. 07/2019 MV: EF 77%, no ischemia/infarct.   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 08/2019 Echo: EF 55-60%, no rwma, Gr1 DD, nl RV size/fxn.   Gallstones    GERD (gastroesophageal reflux disease)    Hydrocephalus (HCC)    [facility]    Hyperglycemia    Hyperlipidemia    Hypertension    Lymphocytosis    monoclonal b cell   Migraine, unspecified, without mention of intractable migraine without mention of status migrainosus 12/14/2012    Osteoarthritis, chronic    Osteoporosis    Peptic ulcer    some bleeding with ulcers as well     Past Surgical History:  Procedure Laterality Date   ABDOMINAL HYSTERECTOMY     BREAST CYST ASPIRATION Left 1993   Removed   breast cyst removed Right    benign    BREAST LUMPECTOMY Bilateral    COLONOSCOPY     ERCP N/A 02/04/2018   Procedure: ENDOSCOPIC RETROGRADE CHOLANGIOPANCREATOGRAPHY (ERCP);  Surgeon: Lucilla Lame, MD;  Location[facility] END[facility]COPY;  Service: Endoscopy;  Laterality: N/A;   ERCP N/A 02/28/2018   Procedure: ENDOSCOPIC RETROGRADE CHOLANGIOPANCREATOGRAPHY (ERCP) STENT REMOVAL;  Surgeon: Lucilla Lame, MD;  Location: [facility] ENDOSCOPY;  Service: Endoscopy;  Laterality: N/A;   EYE SURGERY     growth removal Left 2019   L wrist growth removed, abnormal cells   History of Cataract surgery Right 2011   left 2011   PARTIAL HYSTERECTOMY     POLYPECTOMY     ROBOTIC ASSISTED LAPAROSCOPIC CHOLECYSTECTOMY-MULTI SITE N/A 03/08/2018   Procedure: ROBOTIC ASSISTED LAPAROSCOPIC CHOLECYSTECTOMY-MULTI SITE;  Surgeon: Jules Husbands, MD;  Location: [facility] ORS;  Service: General;  Laterality: N/A;   S/P ACL repair Left    knee   TONSILLECTOMY     TUBAL LIGATION  1975   Bilateral     Current Outpatient Medications  Medication Sig Dispense Refill   ALPRAZolam (XANAX) 0.25 MG tablet TAKE  ONE TABLET BY MOUTH ONE TIME DAILY AS NEEDED FOR ANXIETY 90 tablet 0   apixaban (ELIQUIS) 5 MG TABS tablet Take 1 tablet (5 mg total) by mouth 2 (two) times daily. 180 tablet 3   aspirin 81 MG tablet Take 81 mg by mouth daily.     carvedilol (COREG) 12.5 MG tablet Take 1 tablet (12.5 mg total) by mouth 2 (two) times daily with a meal. 180 tablet 0   Cholecalciferol (VITAMIN D3) 1.25 MG (50000 UT) CAPS Take 1 capsule by mouth once a week. 12 capsule 3   colestipol (COLESTID) 1 g tablet TAKE 1 TABLET(1 GRAM) BY MOUTH TWICE DAILY 60 tablet 5   cyclobenzaprine (FLEXERIL) 10 MG tablet Take 1 tablet (10 mg total)  by mouth at bedtime as needed for muscle spasms. 90 tablet 3   donepezil (ARICEPT) 10 MG tablet Take 1 tablet (10 mg total) by mouth at bedtime. 90 tablet 4   ezetimibe (ZETIA) 10 MG tablet Take 1 tablet (10 mg total) by mouth at bedtime. 90 tablet 1   gabapentin (NEURONTIN) 600 MG tablet Take 1 tablet (600 mg total) by mouth 2 (two) times daily. 180 tablet 2   HYDROcodone-acetaminophen (NORCO/VICODIN) 5-325 MG tablet Take 1 tablet by mouth every 6 (six) hours as needed for moderate pain. 90 tablet 0   levothyroxine (SYNTHROID) 112 MCG tablet Take 1 tablet (112 mcg total) by mouth daily before breakfast. 90 tablet 3   losartan (COZAAR) 50 MG tablet Take 1 tablet (50 mg total) by mouth at bedtime. 90 tablet 3   Magnesium 250 MG TABS Take 1 tablet by mouth daily.     potassium chloride SA (K-DUR) 20 MEQ tablet TAKE 1 TABLET BY MOUTH TWICE DAILY 180 tablet 1   solifenacin (VESICARE) 10 MG tablet Take 1 tablet (10 mg total) by mouth daily. 90 tablet 3   triamterene-hydrochlorothiazide (MAXZIDE) 75-50 MG tablet Take 0.5 tablets by mouth daily. 90 tablet 3   verapamil (VERELAN PM) 120 MG 24 hr capsule TAKE 1 CAPSULE BY MOUTH EVERY DAY. 90 capsule 3   zolpidem (AMBIEN) 10 MG tablet Take 1 tablet (10 mg total) by mouth at bedtime. 90 tablet 1   No current facility-administered medications for this visit.    Allergies:   Shellfish allergy and Latex    Social History:  The patient  reports that she has never smoked. She has never used smokeless tobacco. She reports current alcohol use of about 1.0 - 3.0 standard drink per week. She reports that she does not use drugs.   Family History:  The patient's family history includes Colon cancer in her father and mother; Heart disease in her father; Hypertension in her father and mother.    ROS:  Please see the history of present illness.   Otherwise, review of systems are positive for none.   All other systems are reviewed and negative.    PHYSICAL  EXAM: VS:  BP 140/70 (BP Location: Left Arm, Patient Position: Sitting, Cuff Size: Normal)    Pulse 64    Ht 5' 4.5" (1.638 m)    Wt 203 lb (92.1 kg)    SpO2 95%    BMI 34.31 kg/m  , BMI Body mass index is 34.31 kg/m. GEN: Well nourished, well developed, in no acute distress  HEENT: normal  Neck: no JVD, carotid bruits, or masses Cardiac: RRR; no murmurs, rubs, or gallops,no edema  Respiratory:  clear to auscultation bilaterally, normal work of breathing GI: soft, nontender,  nondistended, + BS MS: no deformity or atrophy  Skin: warm and dry, no rash Neuro:  Strength and sensation are intact Psych: euthymic mood, full affect   EKG:  EKG is ordered today. The ekg ordered today demonstrates normal sinus rhythm with no significant ST or T wave changes.   Recent Labs: 07/01/2020: TSH 2.621 12/16/2020: ALT 21; BUN 18; Creatinine, Ser 1.17; Hemoglobin 13.0; Platelets 259; Potassium 3.6; Sodium 136    Lipid Panel    Component Value Date/Time   CHOL 183 07/01/2020 1359   CHOL 181 07/26/2019 1015   TRIG 173 (H) 07/01/2020 1359   HDL 48 07/01/2020 1359   HDL 44 07/26/2019 1015   CHOLHDL 3.8 07/01/2020 1359   VLDL 35 07/01/2020 1359   LDLCALC 100 (H) 07/01/2020 1359   LDLCALC 111 (H) 07/26/2019 1015      Wt Readings from Last 3 Encounters:  05/30/21 203 lb (92.1 kg)  01/28/21 197 lb 14.4 oz (89.8 kg)  01/24/21 199 lb 15.3 oz (90.7 kg)       PAD Screen 08/01/2019  Previous PAD dx? No  Previous surgical procedure? No  Pain with walking? Yes  Subsides with rest? Yes  Feet/toe relief with dangling? No  Painful, non-healing ulcers? No  Extremities discolored? No      ASSESSMENT AND PLAN:  1.  Exertional dyspnea : Cardiac work-up has been unremarkable overall including stress testing and echocardiogram.  Her symptoms are overall stable.  2.  Essential hypertension: Blood pressure is reasonably controlled on current medications.  If blood pressure increases in the future,  would recommend stopping verapamil and switching to amlodipine instead and maximizing the dose of carvedilol.  I refilled carvedilol.  3.  Hyperlipidemia: Currently on Zetia 10 mg daily.  Most recent lipid profile showed an LDL of 100.    Disposition:   FU with me in 12 month  Signed,  Kathlyn Sacramento, MD  05/30/2021 11:13 AM    [place]

## 2021-06-10 ENCOUNTER — Other Ambulatory Visit: Payer: Self-pay | Admitting: *Deleted

## 2021-06-10 DIAGNOSIS — C9110 Chronic lymphocytic leukemia of B-cell type not having achieved remission: Secondary | ICD-10-CM

## 2021-06-16 ENCOUNTER — Inpatient Hospital Stay: Payer: Medicare PPO | Attending: Oncology

## 2021-06-16 ENCOUNTER — Telehealth: Payer: Self-pay | Admitting: *Deleted

## 2021-06-16 ENCOUNTER — Inpatient Hospital Stay: Payer: Medicare PPO | Admitting: Oncology

## 2021-06-16 ENCOUNTER — Other Ambulatory Visit: Payer: Self-pay

## 2021-06-16 ENCOUNTER — Encounter: Payer: Self-pay | Admitting: Oncology

## 2021-06-16 ENCOUNTER — Encounter: Payer: Self-pay | Admitting: Gastroenterology

## 2021-06-16 VITALS — BP 151/93 | HR 62 | Temp 96.6°F | Resp 16 | Ht 64.5 in | Wt 199.6 lb

## 2021-06-16 DIAGNOSIS — I82432 Acute embolism and thrombosis of left popliteal vein: Secondary | ICD-10-CM | POA: Diagnosis not present

## 2021-06-16 DIAGNOSIS — Z800 Family history of malignant neoplasm of digestive organs: Secondary | ICD-10-CM | POA: Insufficient documentation

## 2021-06-16 DIAGNOSIS — C9110 Chronic lymphocytic leukemia of B-cell type not having achieved remission: Secondary | ICD-10-CM | POA: Diagnosis not present

## 2021-06-16 DIAGNOSIS — I824Y2 Acute embolism and thrombosis of unspecified deep veins of left proximal lower extremity: Secondary | ICD-10-CM

## 2021-06-16 DIAGNOSIS — Z79899 Other long term (current) drug therapy: Secondary | ICD-10-CM | POA: Diagnosis not present

## 2021-06-16 DIAGNOSIS — K921 Melena: Secondary | ICD-10-CM | POA: Insufficient documentation

## 2021-06-16 DIAGNOSIS — E039 Hypothyroidism, unspecified: Secondary | ICD-10-CM | POA: Diagnosis not present

## 2021-06-16 DIAGNOSIS — D7219 Other eosinophilia: Secondary | ICD-10-CM | POA: Insufficient documentation

## 2021-06-16 DIAGNOSIS — I10 Essential (primary) hypertension: Secondary | ICD-10-CM | POA: Insufficient documentation

## 2021-06-16 DIAGNOSIS — Z7901 Long term (current) use of anticoagulants: Secondary | ICD-10-CM | POA: Diagnosis not present

## 2021-06-16 LAB — CBC WITH DIFFERENTIAL/PLATELET
Abs Immature Granulocytes: 0.04 10*3/uL (ref 0.00–0.07)
Basophils Absolute: 0.1 10*3/uL (ref 0.0–0.1)
Basophils Relative: 1 %
Eosinophils Absolute: 0.6 10*3/uL — ABNORMAL HIGH (ref 0.0–0.5)
Eosinophils Relative: 4 %
HCT: 41.5 % (ref 36.0–46.0)
Hemoglobin: 13.5 g/dL (ref 12.0–15.0)
Immature Granulocytes: 0 %
Lymphocytes Relative: 58 %
Lymphs Abs: 9.1 10*3/uL — ABNORMAL HIGH (ref 0.7–4.0)
MCH: 29.6 pg (ref 26.0–34.0)
MCHC: 32.5 g/dL (ref 30.0–36.0)
MCV: 91 fL (ref 80.0–100.0)
Monocytes Absolute: 1.7 10*3/uL — ABNORMAL HIGH (ref 0.1–1.0)
Monocytes Relative: 11 %
Neutro Abs: 4 10*3/uL (ref 1.7–7.7)
Neutrophils Relative %: 26 %
Platelets: 282 10*3/uL (ref 150–400)
RBC: 4.56 MIL/uL (ref 3.87–5.11)
RDW: 13.8 % (ref 11.5–15.5)
Smear Review: NORMAL
WBC: 15.5 10*3/uL — ABNORMAL HIGH (ref 4.0–10.5)
nRBC: 0 % (ref 0.0–0.2)

## 2021-06-16 LAB — COMPREHENSIVE METABOLIC PANEL
ALT: 23 U/L (ref 0–44)
AST: 23 U/L (ref 15–41)
Albumin: 3.9 g/dL (ref 3.5–5.0)
Alkaline Phosphatase: 82 U/L (ref 38–126)
Anion gap: 7 (ref 5–15)
BUN: 15 mg/dL (ref 8–23)
CO2: 31 mmol/L (ref 22–32)
Calcium: 9.1 mg/dL (ref 8.9–10.3)
Chloride: 98 mmol/L (ref 98–111)
Creatinine, Ser: 1.22 mg/dL — ABNORMAL HIGH (ref 0.44–1.00)
GFR, Estimated: 45 mL/min — ABNORMAL LOW (ref >60)
Glucose, Bld: 98 mg/dL (ref 70–99)
Potassium: 3.7 mmol/L (ref 3.5–5.1)
Sodium: 136 mmol/L (ref 135–145)
Total Bilirubin: 0.3 mg/dL (ref 0.3–1.2)
Total Protein: 6.9 g/dL (ref 6.5–8.1)

## 2021-06-16 LAB — LACTATE DEHYDROGENASE: LDH: 170 U/L (ref 98–192)

## 2021-06-16 LAB — ANTITHROMBIN III: AntiThromb III Func: 104 % (ref 75–120)

## 2021-06-16 NOTE — Telephone Encounter (Signed)
Patient has new appointment for April 2024 she thinks is should be April 2023.

## 2021-06-16 NOTE — Progress Notes (Signed)
Hematology/Oncology Consult note [facility]  [phone] [phone]  Patient Care Team: [place] as PCP - General [place], MD as PCP - Cardiology (Cardiology) Monna Fam, MD as Consulting Physician (Ophthalmology) [name], MD as Consulting Physician (Neurology)   Name of the [name]  [id number]  1940/07/31   Date of visit: 06/16/21  Diagnosis- 1.  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  Moderate eosinophilia possibly secondary to CLL etiology unclear  3. LLE DVT in October 2022  Chief complaint/ Reason for visit-routine follow-up of CLL and follow-up of acute DVT  Heme/Onc history: patient is a 81 year old female with a past medical history significant for hypertension, vitamin D deficiency and hypothyroidism. She has been sent to [place] for evaluation of leukocytosis. Over the last 1 year patient's white count has been around 12 with predominantly lymphocytosis and monocytosis as well as eosinophilia. No evidence of anemia or thrombocytopenia. Overall patient is doing well and denies any complaints of fevers, chills, unintentional weight loss, fatigue or night sweats. Denies any lumps or bumps anywhere   Further blood work from 07-2016 was as follows: CBC showed white count of 12.9 with an absolute lymphocyte count of 6.6 and an eosinophilia of 1.1. H&H was 13.4/41.6 and a platelet count of 270. CMP was unremarkable. Review of peripheral smear showed mild leukocytosis with absolute lymphocytosis and eosinophilia. BCR abl testing was negative for CML. Flow cytometry showed CD5 positive, CD23 positive clonal B-cell population CLL/SLL phenotype CD38 negative. 36% of leukocytes are less than 5000/mcL. Eosinophilia  Patient found to have left lower extremity DVT in October 2022 when she presented with left calf pain and swelling.  Occlusive DVT noted in the left popliteal vein posterior  tibial vein and peroneal vein.  She is on Eliquis since then.  Interval history-patient reports occasional bright red blood in her stool and toilet paper over the last few weeks.  Denies any dark melanotic stools.  Reports ongoing fatigue.  ECOG PS- 1 Pain scale- 0  Review of systems- Review of Systems  Constitutional:  Positive for malaise/fatigue. Negative for chills, fever and weight loss.  HENT:  Negative for congestion, ear discharge and nosebleeds.   Eyes:  Negative for blurred vision.  Respiratory:  Negative for cough, hemoptysis, sputum production, shortness of breath and wheezing.   Cardiovascular:  Negative for chest pain, palpitations, orthopnea and claudication.  Gastrointestinal:  Positive for blood in stool. Negative for abdominal pain, constipation, diarrhea, heartburn, melena, nausea and vomiting.  Genitourinary:  Negative for dysuria, flank pain, frequency, hematuria and urgency.  Musculoskeletal:  Negative for back pain, joint pain and myalgias.  Skin:  Negative for rash.  Neurological:  Negative for dizziness, tingling, focal weakness, seizures, weakness and headaches.  Endo/Heme/Allergies:  Does not bruise/bleed easily.  Psychiatric/Behavioral:  Negative for depression and suicidal ideas. The patient does not have insomnia.      Allergies  Allergen Reactions   Shellfish Allergy     Throat closes, rash   Latex Rash    Tapes,adhesives     Past Medical History:  Diagnosis Date   Allergy    some   Cataract    bilaterally both eyes    Chest pain    a. 07/2019 MV: EF 77%, no ischemia/infarct.   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 08/2019 Echo: EF 55-60%, no rwma, Gr1 DD, nl  RV size/fxn.   Gallstones    GERD (gastroesophageal reflux disease)    Hydrocephalus (HCC)    [facility]    Hyperglycemia    Hyperlipidemia    Hypertension    Lymphocytosis    monoclonal b cell   Migraine, unspecified, without mention  of intractable migraine without mention of status migrainosus 12/14/2012   Osteoarthritis, chronic    Osteoporosis    Peptic ulcer    some bleeding with ulcers as well      Past Surgical History:  Procedure Laterality Date   ABDOMINAL HYSTERECTOMY     BREAST CYST ASPIRATION Left 1993   Removed   breast cyst removed Right    benign    BREAST LUMPECTOMY Bilateral    COLONOSCOPY     ERCP N/A 02/04/2018   Procedure: ENDOSCOPIC RETROGRADE CHOLANGIOPANCREATOGRAPHY (ERCP);  Surgeon: Lucilla Lame, MD;  Location: [facility] ENDOSCOPY;  Service: Endoscopy;  Laterality: N/A;   ERCP N/A 02/28/2018   Procedure: ENDOSCOPIC RETROGRADE CHOLANGIOPANCREATOGRAPHY (ERCP) STENT REMOVAL;  Surgeon: Lucilla Lame, MD;  Location: [facility] ENDOSCOPY;  Service: Endoscopy;  Laterality: N/A;   EYE SURGERY     growth removal Left 2019   L wrist growth removed, abnormal cells   History of Cataract surgery Right 2011   left 2011   PARTIAL HYSTERECTOMY     POLYPECTOMY     ROBOTIC ASSISTED LAPAROSCOPIC CHOLECYSTECTOMY-MULTI SITE N/A 03/08/2018   Procedure: ROBOTIC ASSISTED LAPAROSCOPIC CHOLECYSTECTOMY-MULTI SITE;  Surgeon: Jules Husbands, MD;  Location: [facility] ORS;  Service: General;  Laterality: N/A;   S/P ACL repair Left    knee   TONSILLECTOMY     TUBAL LIGATION  1975   Bilateral    Social History   Socioeconomic History   Marital status: Divorced    Spouse name: Not on file   Number of children: 1   Years of education: college   Highest education level: Not on file  Occupational History   Occupation: book Production designer, theatre/television/film: OTHER    Comment: Paediatric nurse  Tobacco Use   Smoking status: Never   Smokeless tobacco: Never  Vaping Use   Vaping Use: Never used  Substance and Sexual Activity   Alcohol use: Yes    Alcohol/week: 1.0 - 3.0 standard drink    Types: 1 - 3 Glasses of wine per week   Drug use: No   Sexual activity: Not Currently  Other Topics Concern   Not on file  Social History  Narrative   Patient is right handed, resides with daughter and grandson in her home. Divorced.      Social Determinants of Health   Financial Resource Strain: Not on file  Food Insecurity: Not on file  Transportation Needs: Not on file  Physical Activity: Not on file  Stress: Not on file  Social Connections: Not on file  Intimate Partner Violence: Not on file    Family History  Problem Relation Age of Onset   Colon cancer Mother    Hypertension Mother    Colon cancer Father    Heart disease Father    Hypertension Father    Colon polyps Neg Hx    Rectal cancer Neg Hx    Stomach cancer Neg Hx      Current Outpatient Medications:    ALPRAZolam (XANAX) 0.25 MG tablet, TAKE ONE TABLET BY MOUTH ONE TIME DAILY AS NEEDED FOR ANXIETY, Disp: 90 tablet, Rfl: 0   apixaban (ELIQUIS) 5 MG TABS tablet, Take 1 tablet (5  mg total) by mouth 2 (two) times daily., Disp: 180 tablet, Rfl: 3   carvedilol (COREG) 12.5 MG tablet, Take 1 tablet (12.5 mg total) by mouth 2 (two) times daily with a meal., Disp: 180 tablet, Rfl: 3   Cholecalciferol (VITAMIN D3) 1.25 MG (50000 UT) CAPS, Take 1 capsule by mouth once a week., Disp: 12 capsule, Rfl: 3   colestipol (COLESTID) 1 g tablet, TAKE 1 TABLET(1 GRAM) BY MOUTH TWICE DAILY, Disp: 60 tablet, Rfl: 5   cyclobenzaprine (FLEXERIL) 10 MG tablet, Take 1 tablet (10 mg total) by mouth at bedtime as needed for muscle spasms., Disp: 90 tablet, Rfl: 3   donepezil (ARICEPT) 10 MG tablet, Take 1 tablet (10 mg total) by mouth at bedtime., Disp: 90 tablet, Rfl: 4   ezetimibe (ZETIA) 10 MG tablet, Take 1 tablet (10 mg total) by mouth at bedtime., Disp: 90 tablet, Rfl: 1   gabapentin (NEURONTIN) 600 MG tablet, Take 1 tablet (600 mg total) by mouth 2 (two) times daily., Disp: 180 tablet, Rfl: 2   HYDROcodone-acetaminophen (NORCO/VICODIN) 5-325 MG tablet, Take 1 tablet by mouth every 6 (six) hours as needed for moderate pain., Disp: 90 tablet, Rfl: 0   levothyroxine  (SYNTHROID) 112 MCG tablet, Take 1 tablet (112 mcg total) by mouth daily before breakfast., Disp: 90 tablet, Rfl: 3   losartan (COZAAR) 50 MG tablet, Take 1 tablet (50 mg total) by mouth at bedtime., Disp: 90 tablet, Rfl: 3   Magnesium 250 MG TABS, Take 1 tablet by mouth daily., Disp: , Rfl:    potassium chloride SA (K-DUR) 20 MEQ tablet, TAKE 1 TABLET BY MOUTH TWICE DAILY, Disp: 180 tablet, Rfl: 1   solifenacin (VESICARE) 10 MG tablet, Take 1 tablet (10 mg total) by mouth daily., Disp: 90 tablet, Rfl: 3   triamterene-hydrochlorothiazide (MAXZIDE) 75-50 MG tablet, Take 0.5 tablets by mouth daily., Disp: 90 tablet, Rfl: 3   verapamil (VERELAN PM) 120 MG 24 hr capsule, TAKE 1 CAPSULE BY MOUTH EVERY DAY., Disp: 90 capsule, Rfl: 3   zolpidem (AMBIEN) 10 MG tablet, Take 1 tablet (10 mg total) by mouth at bedtime., Disp: 90 tablet, Rfl: 1  Physical exam:  Vitals:   06/16/21 1000  BP: (!) 151/93  Pulse: 62  Resp: 16  Temp: (!) 96.6 F (35.9 C)  TempSrc: Tympanic  SpO2: 97%  Weight: 199 lb 9.6 oz (90.5 kg)  Height: 5' 4.5" (1.638 m)   Physical Exam Constitutional:      General: She is not in acute distress. Cardiovascular:     Rate and Rhythm: Normal rate and regular rhythm.     Heart sounds: Normal heart sounds.  Pulmonary:     Effort: Pulmonary effort is normal.     Breath sounds: Normal breath sounds.  Abdominal:     General: Bowel sounds are normal.     Palpations: Abdomen is soft.  Lymphadenopathy:     Comments: No palpable cervical, supraclavicular, axillary or inguinal adenopathy    Skin:    General: Skin is warm and dry.  Neurological:     Mental Status: She is alert and oriented to person, place, and time.     CMP Latest Ref Rng & Units 06/16/2021  Glucose 70 - 99 mg/dL 98  BUN 8 - 23 mg/dL 15  Creatinine 0.44 - 1.00 mg/dL 1.22(H)  Sodium 135 - 145 mmol/L 136  Potassium 3.5 - 5.1 mmol/L 3.7  Chloride 98 - 111 mmol/L 98  CO2 22 - 32 mmol/L 31  Calcium 8.9 - 10.3  mg/dL 9.1  Total Protein 6.5 - 8.1 g/dL 6.9  Total Bilirubin 0.3 - 1.2 mg/dL 0.3  Alkaline Phos 38 - 126 U/L 82  AST 15 - 41 U/L 23  ALT 0 - 44 U/L 23   CBC Latest Ref Rng & Units 06/16/2021  WBC 4.0 - 10.5 K/uL 15.5(H)  Hemoglobin 12.0 - 15.0 g/dL 13.5  Hematocrit 36.0 - 46.0 % 41.5  Platelets 150 - 400 K/uL 282     Assessment and plan- Patient is a 81 y.o. female who is here for follow-up of following issues:  Rai stage 0 CLL: White cell count mildly elevated at 15 which is overall stable as compared to prior values.  Hemoglobin stable around 13 and platelet counts are normal.  No palpable adenopathy or splenomegaly.  No significant B symptoms.  Patient does not require any treatment for her CLL at this time.  Left lower extremity DVT in October 2022.  We will check hypercoagulable work-up today.  She will continue Eliquis at this time.  Ideally I would like her to stay on anticoagulation for at least 6 months ending in April 2022 I will see her at that time and decide about continuation of anticoagulation.  She will need CBC and D-dimer to be checked at that time  Bright red blood in stool: I have asked her to get in touch with GI to see if any endoscopy intervention is required.  If it is required patient can come off Eliquis for 2 days prior to procedure and restart after procedure.  She will get in touch with them   Visit Diagnosis 1. Acute deep vein thrombosis (DVT) of proximal vein of left lower extremity ([place])   2. CLL (chronic lymphocytic leukemia) ([place])      Dr. Randa Evens, MD, MPH [facility] [phone] 06/16/2021 1:44 PM

## 2021-06-17 LAB — LUPUS ANTICOAGULANT
DRVVT: 93.2 s — ABNORMAL HIGH (ref 0.0–47.0)
PTT Lupus Anticoagulant: 41 s (ref 0.0–43.5)
Thrombin Time: 17 s (ref 0.0–23.0)
dPT Confirm Ratio: 0.92 Ratio (ref 0.00–1.34)
dPT: 55.4 s — ABNORMAL HIGH (ref 0.0–47.6)

## 2021-06-17 LAB — DRVVT MIX: dRVVT Mix: 63.6 s — ABNORMAL HIGH (ref 0.0–40.4)

## 2021-06-17 LAB — HEXAGONAL PHASE PHOSPHOLIPID: Hex Phosph Neut Test: 3 s (ref 0–11)

## 2021-06-17 LAB — PROTEIN S PANEL
Protein S Activity: 69 % (ref 63–140)
Protein S Ag, Free: 92 % (ref 61–136)
Protein S Ag, Total: 112 % (ref 60–150)

## 2021-06-17 LAB — BETA-2-GLYCOPROTEIN I ABS, IGG/M/A
Beta-2 Glyco I IgG: <9 GPI IgG units (ref 0–20)
Beta-2-Glycoprotein I IgA: <9 GPI IgA units (ref 0–25)
Beta-2-Glycoprotein I IgM: <9 GPI IgM units (ref 0–32)

## 2021-06-17 LAB — DRVVT CONFIRM: dRVVT Confirm: 0.9 ratio (ref 0.8–1.2)

## 2021-06-17 LAB — HEX PHASE PHOSPHOLIPID REFLEX

## 2021-06-17 LAB — CARDIOLIPIN ANTIBODIES, IGG, IGM, IGA
Anticardiolipin IgA: <9 APL U/mL (ref 0–11)
Anticardiolipin IgG: <9 GPL U/mL (ref 0–14)
Anticardiolipin IgM: 37 MPL U/mL — ABNORMAL HIGH (ref 0–12)

## 2021-06-18 ENCOUNTER — Encounter: Payer: Self-pay | Admitting: Oncology

## 2021-06-18 LAB — FACTOR 5 LEIDEN

## 2021-06-18 LAB — PROTEIN C, TOTAL: Protein C, Total: 100 % (ref 60–150)

## 2021-06-20 LAB — PROTHROMBIN GENE MUTATION

## 2021-07-01 ENCOUNTER — Other Ambulatory Visit: Payer: Self-pay | Admitting: Neurology

## 2021-07-01 ENCOUNTER — Other Ambulatory Visit: Payer: Self-pay | Admitting: Physician Assistant

## 2021-07-01 DIAGNOSIS — I10 Essential (primary) hypertension: Secondary | ICD-10-CM

## 2021-07-04 ENCOUNTER — Telehealth: Payer: Self-pay | Admitting: Gastroenterology

## 2021-07-04 NOTE — Telephone Encounter (Signed)
Good morning Dr. Fuller Plan, ? ?This patient saw you for colonoscopy in July 2019, after which she had an ERCP done by Dr. Allen Norris at [facility].  (These records are in [place] for your review.)  She states the only reason she went to  was because her daughter was helping her with transportation at the time.  She has also said she will never go back there.  She is requesting to come back and see you again as she is having some bleeding and her oncologists wants her to have colonoscopy.  Patient would need an OV first due to her age and also because she is on Eliquis.  Please let me know if you approve the transfer.  ? ?Thank you. ?

## 2021-07-04 NOTE — Telephone Encounter (Signed)
OK to return to see [place]. Please schedule office visit with me or an APP.  ?

## 2021-07-10 ENCOUNTER — Other Ambulatory Visit: Payer: Self-pay | Admitting: Family Medicine

## 2021-07-10 DIAGNOSIS — F5101 Primary insomnia: Secondary | ICD-10-CM

## 2021-07-15 ENCOUNTER — Ambulatory Visit: Payer: Medicare PPO | Admitting: Gastroenterology

## 2021-07-31 ENCOUNTER — Other Ambulatory Visit: Payer: Self-pay | Admitting: Family Medicine

## 2021-07-31 ENCOUNTER — Ambulatory Visit (INDEPENDENT_AMBULATORY_CARE_PROVIDER_SITE_OTHER): Payer: Medicare PPO

## 2021-07-31 VITALS — Wt 199.0 lb

## 2021-07-31 DIAGNOSIS — Z0000 Encounter for general adult medical examination without abnormal findings: Secondary | ICD-10-CM

## 2021-07-31 DIAGNOSIS — F5101 Primary insomnia: Secondary | ICD-10-CM

## 2021-07-31 NOTE — Telephone Encounter (Signed)
Requested medication (s) are due for refill today:   Provider to review ? ?Requested medication (s) are on the active medication list:   Yes for both ? ?Future visit scheduled:   No ? ? ?Last ordered: Ambien 02/17/2021 #90, 1 refill;   Xanax 07/11/2021 #90, 0 refill ? ?Returned because non delegated refills   ? ?Requested Prescriptions  ?Pending Prescriptions Disp Refills  ? zolpidem (AMBIEN) 10 MG tablet [Pharmacy Med Name: ZOLPIDEM 10 MG TAB] 90 tablet 1  ?  Sig: TAKE ONE TABLET BY MOUTH AT BEDTIME  ?  ? Not Delegated - Psychiatry:  Anxiolytics/Hypnotics Failed - 07/31/2021  2:01 AM  ?  ?  Failed - This refill cannot be delegated  ?  ?  Failed - Urine Drug Screen completed in last 360 days  ?  ?  Failed - Valid encounter within last 6 months  ?  Recent Outpatient Visits   ? ?      ? 6 months ago Acute deep vein thrombosis (DVT) of proximal vein of left lower extremity ([place])  ? [facility] [name], FNP  ? 8 months ago COVID-19  ? [facility] Tally Joe T, FNP  ? 1 year ago Acute midline low back pain without sciatica  ? [place], [place]  ? 1 year ago Functional diarrhea  ? [place], [place]  ? 2 years ago Annual physical exam  ? [facility] Fenton Malling M, [place]  ? ?  ?  ?Future Appointments   ? ?        ? In 1 month [name], MD [facility]  ? ?  ? ?  ?  ?  ? ALPRAZolam (XANAX) 0.25 MG tablet [Pharmacy Med Name: ALPRAZOLAM 0.25 MG TAB] 90 tablet 0  ?  Sig: TAKE ONE TABLET BY MOUTH ONE TIME DAILY FOR ANXIETY  ?  ? Not Delegated - Psychiatry: Anxiolytics/Hypnotics 2 Failed - 07/31/2021  2:01 AM  ?  ?  Failed - This refill cannot be delegated  ?  ?  Failed - Urine Drug Screen completed in last 360 days  ?  ?  Failed - Valid encounter within last 6 months  ?  Recent Outpatient Visits   ? ?      ? 6 months ago Acute deep vein thrombosis (DVT) of proximal vein of  left lower extremity ([place])  ? [facility] [name], FNP  ? 8 months ago COVID-19  ? [facility] Tally Joe T, FNP  ? 1 year ago Acute midline low back pain without sciatica  ? [place], [place]  ? 1 year ago Functional diarrhea  ? [place], [place]  ? 2 years ago Annual physical exam  ? [facility] Fenton Malling M, [place]  ? ?  ?  ?Future Appointments   ? ?        ? In 1 month [name], MD [facility]  ? ?  ? ?  ?  ?  Passed - Patient is not pregnant  ?  ?  ? ?

## 2021-07-31 NOTE — Patient Instructions (Addendum)
Cathy Barnes , ?Thank you for taking time to come for your Medicare Wellness Visit. I appreciate your ongoing commitment to your health goals. Please review the following plan we discussed and let me know if I can assist you in the future.  ? ?Screening recommendations/referrals: ?Colonoscopy: aged out ?Mammogram: aged out ?Bone Density: declined referral ?Recommended yearly ophthalmology/optometry visit for glaucoma screening and checkup ?Recommended yearly dental visit for hygiene and checkup ? ?Vaccinations: ?Influenza vaccine: 01/28/21 ?Pneumococcal vaccine: 06/25/14 ?Tdap vaccine: 11/09/12 ?Shingles vaccine: n/d   ?Covid-19:05/10/19, 06/03/19, 03/04/20 ? ?Advanced directives: no ? ?Conditions/risks identified: none ? ?Next appointment: Follow up in one year for your annual wellness visit - 08/05/22 @ 2:45pm by phone ? ? ? ?Preventive Care 22 Years and Older, Female ?Preventive care refers to lifestyle choices and visits with your health care provider that can promote health and wellness. ?What does preventive care include? ?A yearly physical exam. This is also called an annual well check. ?Dental exams once or twice a year. ?Routine eye exams. Ask your health care provider how often you should have your eyes checked. ?Personal lifestyle choices, including: ?Daily care of your teeth and gums. ?Regular physical activity. ?Eating a healthy diet. ?Avoiding tobacco and drug use. ?Limiting alcohol use. ?Practicing safe sex. ?Taking low-dose aspirin every day. ?Taking vitamin and mineral supplements as recommended by your health care provider. ?What happens during an annual well check? ?The services and screenings done by your health care provider during your annual well check will depend on your age, overall health, lifestyle risk factors, and family history of disease. ?Counseling  ?Your health care provider may ask you questions about your: ?Alcohol use. ?Tobacco use. ?Drug use. ?Emotional well-being. ?Home and  relationship well-being. ?Sexual activity. ?Eating habits. ?History of falls. ?Memory and ability to understand (cognition). ?Work and work Statistician. ?Reproductive health. ?Screening  ?You may have the following tests or measurements: ?Height, weight, and BMI. ?Blood pressure. ?Lipid and cholesterol levels. These may be checked every 5 years, or more frequently if you are over 44 years old. ?Skin check. ?Lung cancer screening. You may have this screening every year starting at age 68 if you have a 30-pack-year history of smoking and currently smoke or have quit within the past 15 years. ?Fecal occult blood test (FOBT) of the stool. You may have this test every year starting at age 8. ?Flexible sigmoidoscopy or colonoscopy. You may have a sigmoidoscopy every 5 years or a colonoscopy every 10 years starting at age 44. ?Hepatitis C blood test. ?Hepatitis B blood test. ?Sexually transmitted disease (STD) testing. ?Diabetes screening. This is done by checking your blood sugar (glucose) after you have not eaten for a while (fasting). You may have this done every 1-3 years. ?Bone density scan. This is done to screen for osteoporosis. You may have this done starting at age 30. ?Mammogram. This may be done every 1-2 years. Talk to your health care provider about how often you should have regular mammograms. ?Talk with your health care provider about your test results, treatment options, and if necessary, the need for more tests. ?Vaccines  ?Your health care provider may recommend certain vaccines, such as: ?Influenza vaccine. This is recommended every year. ?Tetanus, diphtheria, and acellular pertussis (Tdap, Td) vaccine. You may need a Td booster every 10 years. ?Zoster vaccine. You may need this after age 10. ?Pneumococcal 13-valent conjugate (PCV13) vaccine. One dose is recommended after age 67. ?Pneumococcal polysaccharide (PPSV23) vaccine. One dose is recommended after age 91. ?Talk  to your health care provider  about which screenings and vaccines you need and how often you need them. ?This information is not intended to replace advice given to you by your health care provider. Make sure you discuss any questions you have with your health care provider. ?Document Released: 05/03/2015 Document Revised: 12/25/2015 Document Reviewed: 02/05/2015 ?Elsevier Interactive Patient Education ? 2017 [place]. ? ?Fall Prevention in the Home ?Falls can cause injuries. They can happen to people of all ages. There are many things you can do to make your home safe and to help prevent falls. ?What can I do on the outside of my home? ?Regularly fix the edges of walkways and driveways and fix any cracks. ?Remove anything that might make you trip as you walk through a door, such as a raised step or threshold. ?Trim any bushes or trees on the path to your home. ?Use bright outdoor lighting. ?Clear any walking paths of anything that might make someone trip, such as rocks or tools. ?Regularly check to see if handrails are loose or broken. Make sure that both sides of any steps have handrails. ?Any raised decks and porches should have guardrails on the edges. ?Have any leaves, snow, or ice cleared regularly. ?Use sand or salt on walking paths during winter. ?Clean up any spills in your garage right away. This includes oil or grease spills. ?What can I do in the bathroom? ?Use night lights. ?Install grab bars by the toilet and in the tub and shower. Do not use towel bars as grab bars. ?Use non-skid mats or decals in the tub or shower. ?If you need to sit down in the shower, use a plastic, non-slip stool. ?Keep the floor dry. Clean up any water that spills on the floor as soon as it happens. ?Remove soap buildup in the tub or shower regularly. ?Attach bath mats securely with double-sided non-slip rug tape. ?Do not have throw rugs and other things on the floor that can make you trip. ?What can I do in the bedroom? ?Use night lights. ?Make sure  that you have a light by your bed that is easy to reach. ?Do not use any sheets or blankets that are too big for your bed. They should not hang down onto the floor. ?Have a firm chair that has side arms. You can use this for support while you get dressed. ?Do not have throw rugs and other things on the floor that can make you trip. ?What can I do in the kitchen? ?Clean up any spills right away. ?Avoid walking on wet floors. ?Keep items that you use a lot in easy-to-reach places. ?If you need to reach something above you, use a strong step stool that has a grab bar. ?Keep electrical cords out of the way. ?Do not use floor polish or wax that makes floors slippery. If you must use wax, use non-skid floor wax. ?Do not have throw rugs and other things on the floor that can make you trip. ?What can I do with my stairs? ?Do not leave any items on the stairs. ?Make sure that there are handrails on both sides of the stairs and use them. Fix handrails that are broken or loose. Make sure that handrails are as long as the stairways. ?Check any carpeting to make sure that it is firmly attached to the stairs. Fix any carpet that is loose or worn. ?Avoid having throw rugs at the top or bottom of the stairs. If you do have throw rugs,  attach them to the floor with carpet tape. ?Make sure that you have a light switch at the top of the stairs and the bottom of the stairs. If you do not have them, ask someone to add them for you. ?What else can I do to help prevent falls? ?Wear shoes that: ?Do not have high heels. ?Have rubber bottoms. ?Are comfortable and fit you well. ?Are closed at the toe. Do not wear sandals. ?If you use a stepladder: ?Make sure that it is fully opened. Do not climb a closed stepladder. ?Make sure that both sides of the stepladder are locked into place. ?Ask someone to hold it for you, if possible. ?Clearly mark and make sure that you can see: ?Any grab bars or handrails. ?First and last steps. ?Where the edge of  each step is. ?Use tools that help you move around (mobility aids) if they are needed. These include: ?Canes. ?Walkers. ?Scooters. ?Crutches. ?Turn on the lights when you go into a dark area. Replace any light bulbs

## 2021-07-31 NOTE — Progress Notes (Signed)
Virtual Visit via Telephone Note  I connected with  Cathy Barnes on 07/31/21 at  3:15 PM EDT by telephone and verified that I am speaking with the correct person using two identifiers.  Location: Patient: home Provider: [facility] Persons participating in the virtual visit: [place]   I discussed the limitations, risks, security and privacy concerns of performing an evaluation and management service by telephone and the availability of in person appointments. The patient expressed understanding and agreed to proceed.  Interactive audio and video telecommunications were attempted between this nurse and patient, however failed, due to patient having technical difficulties OR patient did not have access to video capability.  We continued and completed visit with audio only.  Some vital signs may be absent or patient reported.   Dionisio David, LPN  Subjective:   Cathy Barnes is a 81 y.o. female who presents for Medicare Annual (Subsequent) preventive examination.  Review of Systems           Objective:    There were no vitals filed for this visit. There is no height or weight on file to calculate BMI.     06/16/2021   10:03 AM 01/24/2021   10:38 AM 12/15/2019   10:31 AM 12/16/2018    1:14 PM 06/14/2018    2:17 PM 03/08/2018    5:57 AM 03/07/2018   12:41 PM  Advanced Directives  Does Patient Have a Medical Advance Directive? Yes No Yes Yes;Unable to assess, patient is non-responsive or altered mental status Yes Yes Yes  Type of Paramedic of [place];Living will  [place];Living will [place];Living will [place];Living will [place];Living will [place];Living will  Does patient want to make changes to medical advance directive?   No - Patient declined No - Patient declined   No - Patient declined  Copy of [place] in  Chart?   No - copy requested No - copy requested No - copy requested No - copy requested No - copy requested  Would patient like information on creating a medical advance directive?  No - Patient declined No - Patient declined        Current Medications (verified) Outpatient Encounter Medications as of 07/31/2021  Medication Sig   ALPRAZolam (XANAX) 0.25 MG tablet Take 1 tablet (0.25 mg total) by mouth daily as needed for anxiety.   apixaban (ELIQUIS) 5 MG TABS tablet Take 1 tablet (5 mg total) by mouth 2 (two) times daily.   carvedilol (COREG) 12.5 MG tablet Take 1 tablet (12.5 mg total) by mouth 2 (two) times daily with a meal.   Cholecalciferol (VITAMIN D3) 1.25 MG (50000 UT) CAPS Take 1 capsule by mouth once a week.   colestipol (COLESTID) 1 g tablet TAKE 1 TABLET(1 GRAM) BY MOUTH TWICE DAILY   cyclobenzaprine (FLEXERIL) 10 MG tablet Take 1 tablet (10 mg total) by mouth at bedtime as needed for muscle spasms.   donepezil (ARICEPT) 10 MG tablet Take 1 tablet (10 mg total) by mouth at bedtime.   ezetimibe (ZETIA) 10 MG tablet Take 1 tablet (10 mg total) by mouth at bedtime.   gabapentin (NEURONTIN) 600 MG tablet Take 1 tablet (600 mg total) by mouth 2 (two) times daily.   HYDROcodone-acetaminophen (NORCO/VICODIN) 5-325 MG tablet Take 1 tablet by mouth every 6 (six) hours as needed for moderate pain.   levothyroxine (SYNTHROID) 112 MCG tablet Take 1  tablet (112 mcg total) by mouth daily before breakfast.   losartan (COZAAR) 50 MG tablet TAKE ONE TABLET BY MOUTH AT BEDTIME   Magnesium 250 MG TABS Take 1 tablet by mouth daily.   potassium chloride SA (K-DUR) 20 MEQ tablet TAKE 1 TABLET BY MOUTH TWICE DAILY   solifenacin (VESICARE) 10 MG tablet Take 1 tablet (10 mg total) by mouth daily.   triamterene-hydrochlorothiazide (MAXZIDE) 75-50 MG tablet Take 0.5 tablets by mouth daily.   verapamil (VERELAN PM) 120 MG 24 hr capsule TAKE 1 CAPSULE BY MOUTH EVERY DAY.   zolpidem (AMBIEN) 10 MG tablet TAKE  ONE TABLET BY MOUTH AT BEDTIME   [DISCONTINUED] ALPRAZolam (XANAX) 0.25 MG tablet TAKE ONE TABLET BY MOUTH ONE TIME DAILY FOR ANXIETY   [DISCONTINUED] zolpidem (AMBIEN) 10 MG tablet Take 1 tablet (10 mg total) by mouth at bedtime.   No facility-administered encounter medications on file as of 07/31/2021.    Allergies (verified) Shellfish-derived products, Shellfish allergy, and Latex   History: Past Medical History:  Diagnosis Date   Allergy    some   Cataract    bilaterally both eyes    Chest pain    a. 07/2019 MV: EF 77%, no ischemia/infarct.   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 08/2019 Echo: EF 55-60%, no rwma, Gr1 DD, nl RV size/fxn.   Gallstones    GERD (gastroesophageal reflux disease)    Hydrocephalus (HCC)    [facility]    Hyperglycemia    Hyperlipidemia    Hypertension    Lymphocytosis    monoclonal b cell   Migraine, unspecified, without mention of intractable migraine without mention of status migrainosus 12/14/2012   Osteoarthritis, chronic    Osteoporosis    Peptic ulcer    some bleeding with ulcers as well    Past Surgical History:  Procedure Laterality Date   ABDOMINAL HYSTERECTOMY     BREAST CYST ASPIRATION Left 1993   Removed   breast cyst removed Right    benign    BREAST LUMPECTOMY Bilateral    COLONOSCOPY     ERCP N/A 02/04/2018   Procedure: ENDOSCOPIC RETROGRADE CHOLANGIOPANCREATOGRAPHY (ERCP);  Surgeon: Lucilla Lame, MD;  Location: [facility] ENDOSCOPY;  Service: Endoscopy;  Laterality: N/A;   ERCP N/A 02/28/2018   Procedure: ENDOSCOPIC RETROGRADE CHOLANGIOPANCREATOGRAPHY (ERCP) STENT REMOVAL;  Surgeon: Lucilla Lame, MD;  Location: [facility] ENDOSCOPY;  Service: Endoscopy;  Laterality: N/A;   EYE SURGERY     growth removal Left 2019   L wrist growth removed, abnormal cells   History of Cataract surgery Right 2011   left 2011   PARTIAL HYSTERECTOMY     POLYPECTOMY     ROBOTIC ASSISTED LAPAROSCOPIC  CHOLECYSTECTOMY-MULTI SITE N/A 03/08/2018   Procedure: ROBOTIC ASSISTED LAPAROSCOPIC CHOLECYSTECTOMY-MULTI SITE;  Surgeon: Jules Husbands, MD;  Location: [facility] ORS;  Service: General;  Laterality: N/A;   S/P ACL repair Left    knee   TONSILLECTOMY     TUBAL LIGATION  1975   Bilateral   Family History  Problem Relation Age of Onset   Colon cancer Mother    Hypertension Mother    Colon cancer Father    Heart disease Father    Hypertension Father    Colon polyps Neg Hx    Rectal cancer Neg Hx    Stomach cancer Neg Hx    Social History   Socioeconomic History   Marital status: Divorced  Spouse name: Not on file   Number of children: 1   Years of education: college   Highest education level: Not on file  Occupational History   Occupation: book Production designer, theatre/television/film: OTHER    Comment: [employer]  Tobacco Use   Smoking status: Never   Smokeless tobacco: Never  Vaping Use   Vaping Use: Never used  Substance and Sexual Activity   Alcohol use: Yes    Alcohol/week: 1.0 - 3.0 standard drink    Types: 1 - 3 Glasses of wine per week   Drug use: No   Sexual activity: Not Currently  Other Topics Concern   Not on file  Social History Narrative   Patient is right handed, resides with daughter and grandson in her home. Divorced.      Social Determinants of Health   Financial Resource Strain: Not on file  Food Insecurity: Not on file  Transportation Needs: Not on file  Physical Activity: Not on file  Stress: Not on file  Social Connections: Not on file    Tobacco Counseling Counseling given: Not Answered   Clinical Intake:  Pre-visit preparation completed: Yes  Pain : No/denies pain     Diabetes: No     Diabetic?no  Interpreter Needed?: No  Information entered by :: Kirke Shaggy, LPN   Activities of Daily Living     View : No data to display.          Patient Care Team: [place] as PCP - General Wellington Hampshire, MD as  PCP - Cardiology (Cardiology) Monna Fam, MD as Consulting Physician (Ophthalmology) [name], MD as Consulting Physician (Neurology)  Indicate any recent Medical Services you may have received from other than [facility] providers in the past year (date may be approximate).     Assessment:   This is a routine wellness examination for [facility].  Hearing/Vision screen No results found.  Dietary issues and exercise activities discussed:     Goals Addressed   None    Depression Screen    07/25/2020    3:59 PM 06/26/2020    3:10 PM 07/24/2019    4:06 PM 09/23/2017    8:49 AM 07/02/2016    1:49 PM 07/02/2016    1:47 PM 01/28/2015    1:59 PM  PHQ 2/9 Scores  PHQ - 2 Score 0 1 0 1 0 0 0  PHQ- 9 Score '1 3  7 '$ 0      Fall Risk    07/25/2020    3:59 PM 06/26/2020    3:10 PM 07/24/2019    4:04 PM 03/23/2018    9:49 AM 03/02/2018    2:28 PM  Fall Risk   Falls in the past year? 0 0 0 0 0  Number falls in past yr: 0 0 0    Injury with Fall? 0 0 0    Risk for fall due to :  No Fall Risks     Follow up Falls evaluation completed Falls evaluation completed Falls evaluation completed      [place]:  Any stairs in or around the home? No  If so, are there any without handrails? No  Home free of loose throw rugs in walkways, pet beds, electrical cords, etc? Yes  Adequate lighting in your home to reduce risk of falls? Yes   ASSISTIVE DEVICES UTILIZED TO PREVENT FALLS:  Life alert? No  Use of a cane,  walker or w/c? No  Grab bars in the bathroom? Yes  Shower chair or bench in shower? No  Elevated toilet seat or a handicapped toilet? No   Cognitive Function:    09/18/2020    8:37 AM 07/05/2017    1:37 PM 05/18/2016    1:52 PM 11/14/2015    4:02 PM  MMSE - Mini Mental State Exam  Orientation to time '5 5 5 5  '$ Orientation to Place '4 5 5 5  '$ Registration '3 3 3 3  '$ Attention/ Calculation '5 5 5 5  '$ Recall '3 3 3 3  '$ Language- name 2 objects '2 2 2 2  '$ Language-  repeat '1 1 1 1  '$ Language- follow 3 step command '3 3 3 3  '$ Language- read & follow direction '1 1 1 1  '$ Write a sentence '1 1 1 1  '$ Copy design '1 1 1 1  '$ Total score '29 30 30 30      '$ 02/07/2015    9:50 AM  Montreal Cognitive Assessment   Visuospatial/ Executive (0/5) 3  Naming (0/3) 2  Attention: Read list of digits (0/2) 2  Attention: Read list of letters (0/1) 1  Attention: Serial 7 subtraction starting at 100 (0/3) 1  Language: Repeat phrase (0/2) 0  Language : Fluency (0/1) 1  Abstraction (0/2) 2  Delayed Recall (0/5) 5  Orientation (0/6) 6  Total 23  Adjusted Score (based on education) 23      07/02/2016    1:50 PM  6CIT Screen  What Year? 0 points  What month? 0 points  What time? 0 points  Count back from 20 0 points  Months in reverse 0 points  Repeat phrase 0 points  Total Score 0 points    Immunizations Immunization History  Administered Date(s) Administered   Fluad Quad(high Dose 65+) 01/28/2021   Influenza Split 01/09/2010, 12/24/2011   Influenza, High Dose Seasonal PF 01/07/2014, 01/03/2018, 12/13/2018   Influenza-Unspecified 02/01/2017   PFIZER(Purple Top)SARS-COV-2 Vaccination 05/10/2019, 06/03/2019, 03/04/2020   Pneumococcal Conjugate-13 06/25/2014   Pneumococcal Polysaccharide-23 05/04/2011   Td 04/18/2004   Tdap 11/09/2012    TDAP status: Up to date  Flu Vaccine status: Up to date  Pneumococcal vaccine status: Up to date  Covid-19 vaccine status: Completed vaccines  Qualifies for Shingles Vaccine? Yes   Zostavax completed No   Shingrix Completed?: No.    Education has been provided regarding the importance of this vaccine. Patient has been advised to call insurance company to determine out of pocket expense if they have not yet received this vaccine. Advised may also receive vaccine at local pharmacy or Health Dept. Verbalized acceptance and understanding.  Screening Tests Health Maintenance  Topic Date Due   Zoster Vaccines- Shingrix (1 of  2) Never done   MAMMOGRAM  04/08/2019   DEXA SCAN  05/01/2019   COVID-19 Vaccine (4 - Booster for Pfizer series) 04/29/2020   INFLUENZA VACCINE  11/18/2021   TETANUS/TDAP  11/10/2022   Pneumonia Vaccine 6+ Years old  Completed   Hepatitis C Screening  Completed   HPV VACCINES  Aged Out    Health Maintenance  Health Maintenance Due  Topic Date Due   Zoster Vaccines- Shingrix (1 of 2) Never done   MAMMOGRAM  04/08/2019   DEXA SCAN  05/01/2019   COVID-19 Vaccine (4 - Booster for Pfizer series) 04/29/2020    Colorectal cancer screening: No longer required.   Mammogram status: No longer required due to age.  Bone Density status: Completed  04/30/14. Results reflect: Bone density results: NORMAL. Repeat every 5 years.- declined referral  Lung Cancer Screening: (Low Dose CT Chest recommended if Age 83-80 years, 30 pack-year currently smoking OR have quit w/in 15years.) does not qualify.   Additional Screening:  Hepatitis C Screening: does qualify; Completed 07/09/16  Vision Screening: Recommended annual ophthalmology exams for early detection of glaucoma and other disorders of the eye. Is the patient up to date with their annual eye exam?  Yes  Who is the provider or what is the name of the office in which the patient attends annual eye exams? Dr.Heckler in [place] If pt is not established with a provider, would they like to be referred to a provider to establish care? No .   Dental Screening: Recommended annual dental exams for proper oral hygiene  Community Resource Referral / Chronic Care Management: CRR required this visit?  No   CCM required this visit?  No      Plan:     I have personally reviewed and noted the following in the patient's chart:   Medical and social history Use of alcohol, tobacco or illicit drugs  Current medications and supplements including opioid prescriptions.  Functional ability and status Nutritional status Physical activity Advanced  directives List of other physicians Hospitalizations, surgeries, and ER visits in previous 12 months Vitals Screenings to include cognitive, depression, and falls Referrals and appointments  In addition, I have reviewed and discussed with patient certain preventive protocols, quality metrics, and best practice recommendations. A written personalized care plan for preventive services as well as general preventive health recommendations were provided to patient.     Dionisio David, LPN   [id number]/[id number]   Nurse Notes: none

## 2021-08-04 ENCOUNTER — Encounter: Payer: Self-pay | Admitting: Oncology

## 2021-08-04 ENCOUNTER — Inpatient Hospital Stay: Payer: Medicare PPO | Admitting: Oncology

## 2021-08-04 ENCOUNTER — Inpatient Hospital Stay: Payer: Medicare PPO | Attending: Oncology

## 2021-08-04 ENCOUNTER — Encounter: Payer: Self-pay | Admitting: *Deleted

## 2021-08-04 VITALS — BP 149/74 | HR 66 | Temp 97.5°F | Resp 18 | Wt 203.2 lb

## 2021-08-04 DIAGNOSIS — Z7901 Long term (current) use of anticoagulants: Secondary | ICD-10-CM | POA: Insufficient documentation

## 2021-08-04 DIAGNOSIS — I10 Essential (primary) hypertension: Secondary | ICD-10-CM | POA: Diagnosis not present

## 2021-08-04 DIAGNOSIS — E034 Atrophy of thyroid (acquired): Secondary | ICD-10-CM

## 2021-08-04 DIAGNOSIS — Z800 Family history of malignant neoplasm of digestive organs: Secondary | ICD-10-CM | POA: Insufficient documentation

## 2021-08-04 DIAGNOSIS — I824Y2 Acute embolism and thrombosis of unspecified deep veins of left proximal lower extremity: Secondary | ICD-10-CM

## 2021-08-04 DIAGNOSIS — E039 Hypothyroidism, unspecified: Secondary | ICD-10-CM | POA: Insufficient documentation

## 2021-08-04 DIAGNOSIS — R5383 Other fatigue: Secondary | ICD-10-CM | POA: Insufficient documentation

## 2021-08-04 DIAGNOSIS — D7219 Other eosinophilia: Secondary | ICD-10-CM | POA: Diagnosis not present

## 2021-08-04 DIAGNOSIS — I82432 Acute embolism and thrombosis of left popliteal vein: Secondary | ICD-10-CM | POA: Insufficient documentation

## 2021-08-04 DIAGNOSIS — I82531 Chronic embolism and thrombosis of right popliteal vein: Secondary | ICD-10-CM | POA: Diagnosis not present

## 2021-08-04 DIAGNOSIS — Z79899 Other long term (current) drug therapy: Secondary | ICD-10-CM | POA: Insufficient documentation

## 2021-08-04 DIAGNOSIS — D508 Other iron deficiency anemias: Secondary | ICD-10-CM

## 2021-08-04 DIAGNOSIS — C9110 Chronic lymphocytic leukemia of B-cell type not having achieved remission: Secondary | ICD-10-CM | POA: Insufficient documentation

## 2021-08-04 LAB — CBC WITH DIFFERENTIAL/PLATELET
Abs Immature Granulocytes: 0.05 10*3/uL (ref 0.00–0.07)
Basophils Absolute: 0.1 10*3/uL (ref 0.0–0.1)
Basophils Relative: 1 %
Eosinophils Absolute: 1 10*3/uL — ABNORMAL HIGH (ref 0.0–0.5)
Eosinophils Relative: 6 %
HCT: 40.1 % (ref 36.0–46.0)
Hemoglobin: 13.1 g/dL (ref 12.0–15.0)
Immature Granulocytes: 0 %
Lymphocytes Relative: 60 %
Lymphs Abs: 10 10*3/uL — ABNORMAL HIGH (ref 0.7–4.0)
MCH: 29.2 pg (ref 26.0–34.0)
MCHC: 32.7 g/dL (ref 30.0–36.0)
MCV: 89.5 fL (ref 80.0–100.0)
Monocytes Absolute: 1 10*3/uL (ref 0.1–1.0)
Monocytes Relative: 6 %
Neutro Abs: 4.5 10*3/uL (ref 1.7–7.7)
Neutrophils Relative %: 27 %
Platelets: 295 10*3/uL (ref 150–400)
RBC: 4.48 MIL/uL (ref 3.87–5.11)
RDW: 13.8 % (ref 11.5–15.5)
WBC: 16.5 10*3/uL — ABNORMAL HIGH (ref 4.0–10.5)
nRBC: 0 % (ref 0.0–0.2)

## 2021-08-04 LAB — D-DIMER, QUANTITATIVE: D-Dimer, Quant: 0.5 ug/mL-FEU (ref 0.00–0.50)

## 2021-08-04 LAB — IRON AND TIBC
Iron: 52 ug/dL (ref 28–170)
Saturation Ratios: 11 % (ref 10.4–31.8)
TIBC: 456 ug/dL — ABNORMAL HIGH (ref 250–450)
UIBC: 404 ug/dL

## 2021-08-04 LAB — FERRITIN: Ferritin: 10 ng/mL — ABNORMAL LOW (ref 11–307)

## 2021-08-04 LAB — VITAMIN B12: Vitamin B-12: 414 pg/mL (ref 180–914)

## 2021-08-04 LAB — TSH: TSH: 0.924 u[IU]/mL (ref 0.350–4.500)

## 2021-08-04 NOTE — Progress Notes (Signed)
? ? ? ?Hematology/Oncology Consult note ?[facility]  ?[phone] [phone] ? ?Patient Care Team: ?[place] as PCP - General ?Wellington Hampshire, MD as PCP - Cardiology (Cardiology) ?Monna Fam, MD as Consulting Physician (Ophthalmology) ?[name], MD as Consulting Physician (Neurology)  ? ?Name of the [name]  ?[id number]  ?Jun 19, 1940  ? ?Date of visit: 08/04/21 ? ?Diagnosis- 1.  Rai stage 0 CLL currently on observation ?2.  Moderate eosinophilia possibly secondary to CLL etiology unclear ? 3. LLE DVT in October 2022 ? ?Chief complaint/ Reason for visit-routine follow-up of CLL and DVT ? ?Heme/Onc history: patient is a 81 year-old female with a past medical history significant for hypertension, vitamin D deficiency and hypothyroidism. She has been sent to [place] for evaluation of leukocytosis. Over the last 1 year patient's white count has been around 12 with predominantly lymphocytosis and monocytosis as well as eosinophilia. No evidence of anemia or thrombocytopenia. Overall patient is doing well and denies any complaints of fevers, chills, unintentional weight loss, fatigue or night sweats. Denies any lumps or bumps anywhere ?  ?Further blood work from 07-2016 was as follows: CBC showed white count of 12.9 with an absolute lymphocyte count of 6.6 and an eosinophilia of 1.1. H&H was 13.4/41.6 and a platelet count of 270. CMP was unremarkable. Review of peripheral smear showed mild leukocytosis with absolute lymphocytosis and eosinophilia. BCR abl testing was negative for CML. Flow cytometry showed CD5 positive, CD23 positive clonal B-cell population CLL/SLL phenotype CD38 negative. 36% of leukocytes are less than 5000/mcL. Eosinophilia ?  ?Patient found to have left lower extremity DVT in October 2022 when she presented with left calf pain and swelling.  Occlusive DVT noted in the left popliteal vein posterior tibial vein and  peroneal vein.  She is on Eliquis since then. ? ?Interval history-patient reports ongoing fatigue.  She has an upcoming colonoscopy sometime in June or July.  Denies any blood loss in the stool or urine ? ?ECOG PS- 1 ?Pain scale- 0 ? ? ?Review of systems- Review of Systems  ?Constitutional:  Positive for malaise/fatigue. Negative for chills, fever and weight loss.  ?HENT:  Negative for congestion, ear discharge and nosebleeds.   ?Eyes:  Negative for blurred vision.  ?Respiratory:  Negative for cough, hemoptysis, sputum production, shortness of breath and wheezing.   ?Cardiovascular:  Negative for chest pain, palpitations, orthopnea and claudication.  ?Gastrointestinal:  Negative for abdominal pain, blood in stool, constipation, diarrhea, heartburn, melena, nausea and vomiting.  ?Genitourinary:  Negative for dysuria, flank pain, frequency, hematuria and urgency.  ?Musculoskeletal:  Negative for back pain, joint pain and myalgias.  ?Skin:  Negative for rash.  ?Neurological:  Negative for dizziness, tingling, focal weakness, seizures, weakness and headaches.  ?Endo/Heme/Allergies:  Does not bruise/bleed easily.  ?Psychiatric/Behavioral:  Negative for depression and suicidal ideas. The patient does not have insomnia.    ? ? ? ?Allergies  ?Allergen Reactions  ? Shellfish-Derived Products Anaphylaxis  ?  Throat closes, rash  ? Shellfish Allergy   ?  Throat closes, rash  ? Latex Rash  ?  Tapes,adhesives  ? ? ? ?Past Medical History:  ?Diagnosis Date  ? Allergy   ? some  ? Cataract   ? bilaterally both eyes   ? Chest pain   ? a. 07/2019 MV: EF 77%, no ischemia/infarct.  ? CLL (chronic lymphocytic leukemia) ([place])   ? stage 0  ? Diastolic dysfunction   ? a. 08/2019  Echo: EF 55-60%, no rwma, Gr1 DD, nl RV size/fxn.  ? Gallstones   ? GERD (gastroesophageal reflux disease)   ? Hydrocephalus ([place])   ? [facility]   ? Hyperglycemia   ? Hyperlipidemia   ? Hypertension   ? 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 Migraine,  unspecified, without mention of intractable migraine without mention of status migrainosus 12/14/2012  ? Osteoarthritis, chronic   ? Osteoporosis   ? Peptic ulcer   ? some bleeding with ulcers as well   ? ? ? ?Past Surgical History:  ?Procedure Laterality Date  ? ABDOMINAL HYSTERECTOMY    ? BREAST CYST ASPIRATION Left 1993  ? Removed  ? breast cyst removed Right   ? benign   ? BREAST LUMPECTOMY Bilateral   ? COLONOSCOPY    ? ERCP N/A 02/04/2018  ? Procedure: ENDOSCOPIC RETROGRADE CHOLANGIOPANCREATOGRAPHY (ERCP);  Surgeon: Lucilla Lame, MD;  Location: [facility] ENDOSCOPY;  Service: Endoscopy;  Laterality: N/A;  ? ERCP N/A 02/28/2018  ? Procedure: ENDOSCOPIC RETROGRADE CHOLANGIOPANCREATOGRAPHY (ERCP) STENT REMOVAL;  Surgeon: Lucilla Lame, MD;  Location: [facility] ENDOSCOPY;  Service: Endoscopy;  Laterality: N/A;  ? EYE SURGERY    ? growth removal Left 2019  ? L wrist growth removed, abnormal cells  ? History of Cataract surgery Right 2011  ? left 2011  ? PARTIAL HYSTERECTOMY    ? POLYPECTOMY    ? ROBOTIC ASSISTED LAPAROSCOPIC CHOLECYSTECTOMY-MULTI SITE N/A 03/08/2018  ? Procedure: ROBOTIC ASSISTED LAPAROSCOPIC CHOLECYSTECTOMY-MULTI SITE;  Surgeon: Jules Husbands, MD;  Location: [facility] ORS;  Service: General;  Laterality: N/A;  ? S/P ACL repair Left   ? knee  ? TONSILLECTOMY    ? TUBAL LIGATION  1975  ? Bilateral  ? ? ?Social History  ? ?Socioeconomic History  ? Marital status: Divorced  ?  Spouse name: Not on file  ? Number of children: 1  ? Years of education: college  ? Highest education level: Not on file  ?Occupational History  ? Occupation: book keeping  ?  Employer: OTHER  ?  Comment: [employer]  ?Tobacco Use  ? Smoking status: Never  ? Smokeless tobacco: Never  ?Vaping Use  ? Vaping Use: Never used  ?Substance and Sexual Activity  ? Alcohol use: Yes  ?  Alcohol/week: 1.0 - 3.0 standard drink  ?  Types: 1 - 3 Glasses of wine per week  ? Drug use: No  ? Sexual activity: Not Currently  ?Other Topics Concern  ? Not on  file  ?Social History Narrative  ? Patient is right handed, resides with daughter and grandson in her home. Divorced.  ?   ? ?Social Determinants of Health  ? ?Financial Resource Strain: Low Risk   ? Difficulty of Paying Living Expenses: Not hard at all  ?Food Insecurity: No Food Insecurity  ? Worried About Charity fundraiser in the Last Year: Never true  ? Ran Out of Food in the Last Year: Never true  ?Transportation Needs: No Transportation Needs  ? Lack of Transportation (Medical): No  ? Lack of Transportation (Non-Medical): No  ?Physical Activity: Insufficiently Active  ? Days of Exercise per Week: 2 days  ? Minutes of Exercise per Session: 20 min  ?Stress: No Stress Concern Present  ? Feeling of Stress : Only a little  ?Social Connections: Socially Isolated  ? Frequency of Communication with Friends and Family: More than three times a week  ? Frequency of Social Gatherings with Friends and Family: Three  times a week  ? Attends Religious Services: Never  ? Active Member of Clubs or Organizations: No  ? Attends Archivist Meetings: Never  ? Marital Status: Widowed  ?Intimate Partner Violence: Not At Risk  ? Fear of Current or Ex-Partner: No  ? Emotionally Abused: No  ? Physically Abused: No  ? Sexually Abused: No  ? ? ?Family History  ?Problem Relation Age of Onset  ? Colon cancer Mother   ? Hypertension Mother   ? Colon cancer Father   ? Heart disease Father   ? Hypertension Father   ? Colon polyps Neg Hx   ? Rectal cancer Neg Hx   ? Stomach cancer Neg Hx   ? ? ? ?Current Outpatient Medications:  ?  ALPRAZolam (XANAX) 0.25 MG tablet, Take 1 tablet (0.25 mg total) by mouth daily as needed for anxiety., Disp: 30 tablet, Rfl: 0 ?  apixaban (ELIQUIS) 5 MG TABS tablet, Take 1 tablet (5 mg total) by mouth 2 (two) times daily., Disp: 180 tablet, Rfl: 3 ?  carvedilol (COREG) 12.5 MG tablet, Take 1 tablet (12.5 mg total) by mouth 2 (two) times daily with a meal., Disp: 180 tablet, Rfl: 3 ?  Cholecalciferol  (VITAMIN D3) 1.25 MG (50000 UT) CAPS, Take 1 capsule by mouth once a week., Disp: 12 capsule, Rfl: 3 ?  colestipol (COLESTID) 1 g tablet, TAKE 1 TABLET(1 GRAM) BY MOUTH TWICE DAILY, Disp: 60 tablet, Rfl: 5 ?  cyclobenza

## 2021-08-04 NOTE — Progress Notes (Signed)
Patient here today for follow up regarding CLL. Patient reports ongoing fatigue and lack of energy. Patient reports chronic back pain, currently doing acupuncture.  ?

## 2021-08-05 ENCOUNTER — Telehealth: Payer: Self-pay | Admitting: Oncology

## 2021-08-05 NOTE — Telephone Encounter (Signed)
Left VM with patient and requested she call back to be scheduled for Venofer.  ?

## 2021-08-06 ENCOUNTER — Ambulatory Visit: Payer: Medicare PPO | Admitting: Gastroenterology

## 2021-08-06 ENCOUNTER — Encounter: Payer: Self-pay | Admitting: Gastroenterology

## 2021-08-06 VITALS — BP 136/68 | HR 65 | Ht 65.0 in | Wt 201.2 lb

## 2021-08-06 DIAGNOSIS — K921 Melena: Secondary | ICD-10-CM

## 2021-08-06 DIAGNOSIS — D509 Iron deficiency anemia, unspecified: Secondary | ICD-10-CM | POA: Diagnosis not present

## 2021-08-06 DIAGNOSIS — Z800 Family history of malignant neoplasm of digestive organs: Secondary | ICD-10-CM | POA: Diagnosis not present

## 2021-08-06 MED ORDER — PLENVU 140 G PO SOLR: 1.0000 | Freq: Once | ORAL | 0 refills | Status: AC

## 2021-08-06 NOTE — Progress Notes (Signed)
? ? ?History of Present Illness: This is a 81 year old female referred by Randa Evens, MD for the evaluation of IDA,hematochezia.  She is been treated for a RLE DVT with a 8-monthcourse of Eliquis which was completed on Monday.  She was recently found to have iron deficiency with ferritin = 10 without anemia.  She noted several episodes of small amounts of painless bright blood blood per rectum with bowel movements while taking Eliquis.  She has no other gastrointestinal complaints. Denies weight loss, abdominal pain, constipation, diarrhea, change in stool caliber, melena, nausea, vomiting, dysphagia, reflux symptoms, chest pain. ? ? ?Colonoscopy 10/2016 ?- Mild diverticulosis in the left colon. There was no evidence of diverticular bleeding. ?- The examination was otherwise normal on direct and retroflexion views. ?- No specimens collected. ? ? ?Allergies  ?Allergen Reactions  ? Shellfish-Derived Products Anaphylaxis  ?  Throat closes, rash  ? Shellfish Allergy   ?  Throat closes, rash  ? Latex Rash  ?  Tapes,adhesives  ? ?Outpatient Medications Prior to Visit  ?Medication Sig Dispense Refill  ? ALPRAZolam (XANAX) 0.25 MG tablet Take 1 tablet (0.25 mg total) by mouth daily as needed for anxiety. 30 tablet 0  ? carvedilol (COREG) 12.5 MG tablet Take 1 tablet (12.5 mg total) by mouth 2 (two) times daily with a meal. 180 tablet 3  ? Cholecalciferol (VITAMIN D3) 1.25 MG (50000 UT) CAPS Take 1 capsule by mouth once a week. 12 capsule 3  ? colestipol (COLESTID) 1 g tablet TAKE 1 TABLET(1 GRAM) BY MOUTH TWICE DAILY (Patient taking differently: Take 1 g by mouth as needed. TAKE 1 TABLET(1 GRAM) BY MOUTH TWICE DAILY) 60 tablet 5  ? cyclobenzaprine (FLEXERIL) 10 MG tablet Take 1 tablet (10 mg total) by mouth at bedtime as needed for muscle spasms. 90 tablet 3  ? donepezil (ARICEPT) 10 MG tablet Take 1 tablet (10 mg total) by mouth at bedtime. 90 tablet 4  ? ezetimibe (ZETIA) 10 MG tablet Take 1 tablet (10 mg total) by  mouth at bedtime. 90 tablet 1  ? gabapentin (NEURONTIN) 600 MG tablet Take 1 tablet (600 mg total) by mouth 2 (two) times daily. 180 tablet 2  ? HYDROcodone-acetaminophen (NORCO/VICODIN) 5-325 MG tablet Take 1 tablet by mouth every 6 (six) hours as needed for moderate pain. 90 tablet 0  ? levothyroxine (SYNTHROID) 112 MCG tablet Take 1 tablet (112 mcg total) by mouth daily before breakfast. 90 tablet 3  ? losartan (COZAAR) 50 MG tablet TAKE ONE TABLET BY MOUTH AT BEDTIME 90 tablet 1  ? Magnesium 250 MG TABS Take 1 tablet by mouth daily.    ? potassium chloride SA (K-DUR) 20 MEQ tablet TAKE 1 TABLET BY MOUTH TWICE DAILY 180 tablet 1  ? solifenacin (VESICARE) 10 MG tablet Take 1 tablet (10 mg total) by mouth daily. 90 tablet 3  ? triamterene-hydrochlorothiazide (MAXZIDE) 75-50 MG tablet Take 0.5 tablets by mouth daily. 90 tablet 3  ? verapamil (VERELAN PM) 120 MG 24 hr capsule TAKE 1 CAPSULE BY MOUTH EVERY DAY. 90 capsule 3  ? zolpidem (AMBIEN) 10 MG tablet TAKE ONE TABLET BY MOUTH AT BEDTIME 30 tablet 0  ? apixaban (ELIQUIS) 5 MG TABS tablet Take 1 tablet (5 mg total) by mouth 2 (two) times daily. 180 tablet 3  ? ?No facility-administered medications prior to visit.  ? ?Past Medical History:  ?Diagnosis Date  ? Allergy   ? some  ? Cataract   ? bilaterally both  eyes   ? Chest pain   ? a. 07/2019 MV: EF 77%, no ischemia/infarct.  ? CLL (chronic lymphocytic leukemia) ([place])   ? stage 0  ? Diastolic dysfunction   ? a. 08/2019 Echo: EF 55-60%, no rwma, Gr1 DD, nl RV size/fxn.  ? Gallstones   ? GERD (gastroesophageal reflux disease)   ? H/O blood clots   ? Hydrocephalus ([place])   ? [facility]   ? Hyperglycemia   ? Hyperlipidemia   ? Hypertension   ? 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 Migraine, unspecified, without mention of intractable migraine without mention of status migrainosus 12/14/2012  ? Osteoarthritis, chronic   ? Osteoporosis   ? Peptic ulcer   ? some bleeding with ulcers as well   ? ?Past Surgical History:   ?Procedure Laterality Date  ? ABDOMINAL HYSTERECTOMY    ? BREAST CYST ASPIRATION Left 1993  ? Removed  ? breast cyst removed Right   ? benign   ? BREAST LUMPECTOMY Bilateral   ? COLONOSCOPY    ? ERCP N/A 02/04/2018  ? Procedure: ENDOSCOPIC RETROGRADE CHOLANGIOPANCREATOGRAPHY (ERCP);  Surgeon: Lucilla Lame, MD;  Location: [facility] ENDOSCOPY;  Service: Endoscopy;  Laterality: N/A;  ? ERCP N/A 02/28/2018  ? Procedure: ENDOSCOPIC RETROGRADE CHOLANGIOPANCREATOGRAPHY (ERCP) STENT REMOVAL;  Surgeon: Lucilla Lame, MD;  Location: [facility] ENDOSCOPY;  Service: Endoscopy;  Laterality: N/A;  ? EYE SURGERY    ? growth removal Left 2019  ? L wrist growth removed, abnormal cells  ? History of Cataract surgery Right 2011  ? left 2011  ? PARTIAL HYSTERECTOMY    ? POLYPECTOMY    ? ROBOTIC ASSISTED LAPAROSCOPIC CHOLECYSTECTOMY-MULTI SITE N/A 03/08/2018  ? Procedure: ROBOTIC ASSISTED LAPAROSCOPIC CHOLECYSTECTOMY-MULTI SITE;  Surgeon: Jules Husbands, MD;  Location: [facility] ORS;  Service: General;  Laterality: N/A;  ? S/P ACL repair Left   ? knee  ? TONSILLECTOMY    ? TUBAL LIGATION  1975  ? Bilateral  ? ?Social History  ? ?Socioeconomic History  ? Marital status: Divorced  ?  Spouse name: Not on file  ? Number of children: 1  ? Years of education: college  ? Highest education level: Not on file  ?Occupational History  ? Occupation: book keeping  ?  Employer: OTHER  ?  Comment: [employer]  ?Tobacco Use  ? Smoking status: Never  ? Smokeless tobacco: Never  ?Vaping Use  ? Vaping Use: Never used  ?Substance and Sexual Activity  ? Alcohol use: Yes  ?  Alcohol/week: 1.0 - 3.0 standard drink  ?  Types: 1 - 3 Glasses of wine per week  ?  Comment: ocassionally  ? Drug use: No  ? Sexual activity: Not Currently  ?Other Topics Concern  ? Not on file  ?Social History Narrative  ? Patient is right handed, resides with daughter and grandson in her home. Divorced.  ?   ? ?Social Determinants of Health  ? ?Financial Resource Strain: Low Risk   ?  Difficulty of Paying Living Expenses: Not hard at all  ?Food Insecurity: No Food Insecurity  ? Worried About Charity fundraiser in the Last Year: Never true  ? Ran Out of Food in the Last Year: Never true  ?Transportation Needs: No Transportation Needs  ? Lack of Transportation (Medical): No  ? Lack of Transportation (Non-Medical): No  ?Physical Activity: Insufficiently Active  ? Days of Exercise per Week: 2 days  ? Minutes of Exercise per Session: 20 min  ?  Stress: No Stress Concern Present  ? Feeling of Stress : Only a little  ?Social Connections: Socially Isolated  ? Frequency of Communication with Friends and Family: More than three times a week  ? Frequency of Social Gatherings with Friends and Family: Three times a week  ? Attends Religious Services: Never  ? Active Member of Clubs or Organizations: No  ? Attends Archivist Meetings: Never  ? Marital Status: Widowed  ? ?Family History  ?Problem Relation Age of Onset  ? Colon cancer Mother   ? Hypertension Mother   ? Colon cancer Father   ? Heart disease Father   ? Hypertension Father   ? Colon polyps Neg Hx   ? Rectal cancer Neg Hx   ? Stomach cancer Neg Hx   ? Esophageal cancer Neg Hx   ? ?   ? ?Review of Systems: Pertinent positive and negative review of systems were noted in the above HPI section. All other review of systems were otherwise negative. ? ? ?Physical Exam: ?General: Well developed, well nourished, no acute distress ?Head: Normocephalic and atraumatic ?Eyes: Sclerae anicteric, EOMI ?Ears: Normal auditory acuity ?Mouth: Not examined, mask on during Covid-19 pandemic ?Neck: Supple, no masses or thyromegaly ?Lungs: Clear throughout to auscultation ?Heart: Regular rate and rhythm; no murmurs, rubs or bruits ?Abdomen: Soft, non tender and non distended. No masses, hepatosplenomegaly or hernias noted. Normal Bowel sounds ?Rectal: Deferred to colonoscopy ?Musculoskeletal: Symmetrical with no gross deformities  ?Skin: No lesions on visible  extremities ?Pulses:  Normal pulses noted ?Extremities: No clubbing, cyanosis, edema or deformities noted ?Neurological: Alert oriented x 4, grossly nonfocal ?Cervical Nodes:  No significant cervical adenopathy ?Ing

## 2021-08-06 NOTE — Patient Instructions (Signed)
You have been scheduled for an endoscopy and colonoscopy. Please follow the written instructions given to you at your visit today. Please pick up your prep supplies at the pharmacy within the next 1-3 days. If you use inhalers (even only as needed), please bring them with you on the day of your procedure.  The [facility] providers would like to encourage you to use MYCHART to communicate with providers for non-urgent requests or questions.  Due to long hold times on the telephone, sending your provider a message by MYCHART may be a faster and more efficient way to get a response.  Please allow 48 business hours for a response.  Please remember that this is for non-urgent requests.   Due to recent changes in healthcare laws, you may see the results of your imaging and laboratory studies on MyChart before your provider has had a chance to review them.  We understand that in some cases there may be results that are confusing or concerning to you. Not all laboratory results come back in the same time frame and the provider may be waiting for multiple results in order to interpret others.  Please give us 48 hours in order for your provider to thoroughly review all the results before contacting the office for clarification of your results.   Thank you for choosing me and [facility].  Malcolm T. Stark, Jr., MD., FACG  

## 2021-08-07 ENCOUNTER — Telehealth: Payer: Self-pay | Admitting: *Deleted

## 2021-08-07 NOTE — Progress Notes (Signed)
I sent my chart message and she did stop her blood thinner, she does want iron IV. Anderson Malta will make appts for venofer and let pt know ?

## 2021-08-07 NOTE — Telephone Encounter (Signed)
Cathy Barnes had called to set up iron infusions but the pt is having a  colonoscopy 5/24 and was told by dr. Dr. Fuller Plan that she should hold off with iron infusions until after the procedure done on 5/24. Pt knows about this also. ?

## 2021-08-30 ENCOUNTER — Other Ambulatory Visit: Payer: Self-pay | Admitting: Family Medicine

## 2021-08-30 ENCOUNTER — Other Ambulatory Visit: Payer: Self-pay | Admitting: Physician Assistant

## 2021-08-30 DIAGNOSIS — I10 Essential (primary) hypertension: Secondary | ICD-10-CM

## 2021-08-30 DIAGNOSIS — F5101 Primary insomnia: Secondary | ICD-10-CM

## 2021-09-02 ENCOUNTER — Telehealth: Payer: Self-pay | Admitting: Family Medicine

## 2021-09-02 DIAGNOSIS — L814 Other melanin hyperpigmentation: Secondary | ICD-10-CM | POA: Diagnosis not present

## 2021-09-02 DIAGNOSIS — Z85828 Personal history of other malignant neoplasm of skin: Secondary | ICD-10-CM | POA: Diagnosis not present

## 2021-09-02 DIAGNOSIS — L298 Other pruritus: Secondary | ICD-10-CM | POA: Diagnosis not present

## 2021-09-02 DIAGNOSIS — D485 Neoplasm of uncertain behavior of skin: Secondary | ICD-10-CM | POA: Diagnosis not present

## 2021-09-02 DIAGNOSIS — I10 Essential (primary) hypertension: Secondary | ICD-10-CM

## 2021-09-02 DIAGNOSIS — L821 Other seborrheic keratosis: Secondary | ICD-10-CM | POA: Diagnosis not present

## 2021-09-02 MED ORDER — TRIAMTERENE-HCTZ 75-50 MG PO TABS: 0.5000 | ORAL_TABLET | Freq: Every day | ORAL | 0 refills | Status: DC

## 2021-09-02 NOTE — Telephone Encounter (Signed)
[facility] faxed refill request for the following medications: ? ?triamterene-hydrochlorothiazide (MAXZIDE) 75-50 MG tablet  ? ?Please advise. ? ?

## 2021-09-03 ENCOUNTER — Encounter: Payer: Self-pay | Admitting: Gastroenterology

## 2021-09-04 ENCOUNTER — Telehealth: Payer: Self-pay | Admitting: Oncology

## 2021-09-04 NOTE — Telephone Encounter (Signed)
Called patient to schedule IV iron (venofer). Patient stated that she is having a colonoscopy on 5/24 and needs to hold off for now.

## 2021-09-08 ENCOUNTER — Telehealth: Payer: Self-pay | Admitting: Gastroenterology

## 2021-09-08 ENCOUNTER — Other Ambulatory Visit: Payer: Self-pay | Admitting: Family Medicine

## 2021-09-08 DIAGNOSIS — M722 Plantar fascial fibromatosis: Secondary | ICD-10-CM | POA: Diagnosis not present

## 2021-09-08 DIAGNOSIS — Z79899 Other long term (current) drug therapy: Secondary | ICD-10-CM | POA: Diagnosis not present

## 2021-09-08 DIAGNOSIS — M19072 Primary osteoarthritis, left ankle and foot: Secondary | ICD-10-CM | POA: Diagnosis not present

## 2021-09-08 DIAGNOSIS — M10079 Idiopathic gout, unspecified ankle and foot: Secondary | ICD-10-CM | POA: Diagnosis not present

## 2021-09-08 DIAGNOSIS — M25572 Pain in left ankle and joints of left foot: Secondary | ICD-10-CM | POA: Diagnosis not present

## 2021-09-08 DIAGNOSIS — M79672 Pain in left foot: Secondary | ICD-10-CM | POA: Diagnosis not present

## 2021-09-08 DIAGNOSIS — F5101 Primary insomnia: Secondary | ICD-10-CM

## 2021-09-08 NOTE — Telephone Encounter (Signed)
Spoke with patient regarding MD recommendations. Patient verbalized understanding.

## 2021-09-08 NOTE — Telephone Encounter (Signed)
OK to take prednisone now and through the procedures as prescribed. She has a colonoscopy/EGD with me on Wednesday.

## 2021-09-08 NOTE — Telephone Encounter (Signed)
Patient has a procedure with Dr. Ardis Hughs on Wednesday.  She went to another doctor who said she had a gout infection and wanted to start her on prednisone.  Her question is can she take it now or would it be better to wait until after her procedures?  Please call patient and advise.  Thank you.

## 2021-09-08 NOTE — Telephone Encounter (Signed)
This is a Dr Fuller Plan pt.  I will route to his nurse Kaira.

## 2021-09-10 ENCOUNTER — Ambulatory Visit (AMBULATORY_SURGERY_CENTER): Payer: Medicare PPO | Admitting: Gastroenterology

## 2021-09-10 ENCOUNTER — Encounter: Payer: Self-pay | Admitting: Gastroenterology

## 2021-09-10 VITALS — BP 146/49 | HR 59 | Temp 98.4°F | Resp 13 | Ht 65.0 in | Wt 201.0 lb

## 2021-09-10 DIAGNOSIS — K219 Gastro-esophageal reflux disease without esophagitis: Secondary | ICD-10-CM | POA: Diagnosis not present

## 2021-09-10 DIAGNOSIS — Z800 Family history of malignant neoplasm of digestive organs: Secondary | ICD-10-CM | POA: Diagnosis not present

## 2021-09-10 DIAGNOSIS — K621 Rectal polyp: Secondary | ICD-10-CM | POA: Diagnosis not present

## 2021-09-10 DIAGNOSIS — K921 Melena: Secondary | ICD-10-CM | POA: Diagnosis not present

## 2021-09-10 DIAGNOSIS — K319 Disease of stomach and duodenum, unspecified: Secondary | ICD-10-CM

## 2021-09-10 DIAGNOSIS — D128 Benign neoplasm of rectum: Secondary | ICD-10-CM | POA: Diagnosis not present

## 2021-09-10 DIAGNOSIS — D509 Iron deficiency anemia, unspecified: Secondary | ICD-10-CM | POA: Diagnosis not present

## 2021-09-10 DIAGNOSIS — K635 Polyp of colon: Secondary | ICD-10-CM | POA: Diagnosis not present

## 2021-09-10 DIAGNOSIS — D123 Benign neoplasm of transverse colon: Secondary | ICD-10-CM | POA: Diagnosis not present

## 2021-09-10 DIAGNOSIS — D125 Benign neoplasm of sigmoid colon: Secondary | ICD-10-CM | POA: Diagnosis not present

## 2021-09-10 DIAGNOSIS — D120 Benign neoplasm of cecum: Secondary | ICD-10-CM

## 2021-09-10 MED ORDER — PANTOPRAZOLE SODIUM 40 MG PO TBEC: 40.0000 mg | DELAYED_RELEASE_TABLET | Freq: Every morning | ORAL | 3 refills | Status: DC

## 2021-09-10 MED ORDER — SODIUM CHLORIDE 0.9 % IV SOLN: 500.0000 mL | Freq: Once | INTRAVENOUS | Status: DC

## 2021-09-10 NOTE — Progress Notes (Signed)
Vs by CW in adm 

## 2021-09-10 NOTE — Progress Notes (Signed)
History & Physical  Primary Care Physician:  [facility]: Lucio Edward, MD  CHIEF COMPLAINT:   Iron deficiency anemia, small-volume hematochezia, family history of colon cancer  HPI: Cathy Barnes is a 81 y.o. female with iron deficiency anemia, small-volume hematochezia, family history of colon cancer for colonoscopy and EGD.   Past Medical History:  Diagnosis Date   Allergy    some   Cataract    bilaterally both eyes    Chest pain    a. 07/2019 MV: EF 77%, no ischemia/infarct.   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 08/2019 Echo: EF 55-60%, no rwma, Gr1 DD, nl RV size/fxn.   Gallstones    GERD (gastroesophageal reflux disease)    H/O blood clots    Hydrocephalus (HCC)    [facility]    Hyperglycemia    Hyperlipidemia    Hypertension    Lymphocytosis    monoclonal b cell   Migraine, unspecified, without mention of intractable migraine without mention of status migrainosus 12/14/2012   Osteoarthritis, chronic    Osteoporosis    Peptic ulcer    some bleeding with ulcers as well     Past Surgical History:  Procedure Laterality Date   ABDOMINAL HYSTERECTOMY     BREAST CYST ASPIRATION Left 1993   Removed   breast cyst removed Right    benign    BREAST LUMPECTOMY Bilateral    COLONOSCOPY     ERCP N/A 02/04/2018   Procedure: ENDOSCOPIC RETROGRADE CHOLANGIOPANCREATOGRAPHY (ERCP);  Surgeon: Lucilla Lame, MD;  Location: [facility] ENDOSCOPY;  Service: Endoscopy;  Laterality: N/A;   ERCP N/A 02/28/2018   Procedure: ENDOSCOPIC RETROGRADE CHOLANGIOPANCREATOGRAPHY (ERCP) STENT REMOVAL;  Surgeon: Lucilla Lame, MD;  Location: [facility] ENDOSCOPY;  Service: Endoscopy;  Laterality: N/A;   EYE SURGERY     growth removal Left 2019   L wrist growth removed, abnormal cells   History of Cataract surgery Right 2011   left 2011   PARTIAL HYSTERECTOMY     POLYPECTOMY     ROBOTIC ASSISTED LAPAROSCOPIC  CHOLECYSTECTOMY-MULTI SITE N/A 03/08/2018   Procedure: ROBOTIC ASSISTED LAPAROSCOPIC CHOLECYSTECTOMY-MULTI SITE;  Surgeon: Jules Husbands, MD;  Location: [facility] ORS;  Service: General;  Laterality: N/A;   S/P ACL repair Left    knee   TONSILLECTOMY     TUBAL LIGATION  1975   Bilateral    Prior to Admission medications   Medication Sig Start Date End Date Taking? Authorizing Provider  ALPRAZolam (XANAX) 0.25 MG tablet Take 1 tablet (0.25 mg total) by mouth at bedtime. 10/02/21  Yes [name], FNP  carvedilol (COREG) 12.5 MG tablet Take 1 tablet (12.5 mg total) by mouth 2 (two) times daily with a meal. 05/30/21  Yes Wellington Hampshire, MD  cyclobenzaprine (FLEXERIL) 10 MG tablet Take 1 tablet (10 mg total) by mouth at bedtime as needed for muscle spasms. 09/18/20  Yes [name], MD  donepezil (ARICEPT) 10 MG tablet Take 1 tablet (10 mg total) by mouth at bedtime. 09/18/20  Yes [name], MD  ezetimibe (ZETIA) 10 MG tablet Take 1 tablet (10 mg total) by mouth at bedtime. 03/19/21  Yes [name], FNP  gabapentin (NEURONTIN) 600 MG tablet Take 1 tablet (600 mg total) by mouth 2 (two) times daily. 03/11/21  Yes [name], FNP  levothyroxine (SYNTHROID) 112 MCG tablet Take 1 tablet (112 mcg total) by  mouth daily before breakfast. 01/28/21  Yes [name], FNP  losartan (COZAAR) 50 MG tablet TAKE ONE TABLET BY MOUTH AT BEDTIME 07/07/21  Yes Tally Joe T, FNP  Magnesium 250 MG TABS Take 1 tablet by mouth daily.   Yes [provider]  potassium chloride SA (K-DUR) 20 MEQ tablet TAKE 1 TABLET BY MOUTH TWICE DAILY 01/02/19  Yes Fenton Malling M, PA-C  solifenacin (VESICARE) 10 MG tablet Take 1 tablet (10 mg total) by mouth daily. 07/25/20  Yes Mar Daring, PA-C  triamterene-hydrochlorothiazide (MAXZIDE) 75-50 MG tablet Take 0.5 tablets by mouth daily. Please contact our office and schedule follow up visit with your primary care doctor for further refills. 09/02/21   Yes [name], FNP  verapamil (VERELAN PM) 120 MG 24 hr capsule TAKE 1 CAPSULE BY MOUTH EVERY DAY. 01/28/21  Yes [name], FNP  zolpidem (AMBIEN) 10 MG tablet TAKE ONE TABLET BY MOUTH AT BEDTIME 09/09/21  Yes Tally Joe T, FNP  Cholecalciferol (VITAMIN D3) 1.25 MG (50000 UT) CAPS Take 1 capsule by mouth once a week. 03/11/21   [name], FNP  colestipol (COLESTID) 1 g tablet TAKE 1 TABLET(1 GRAM) BY MOUTH TWICE DAILY Patient not taking: Reported on 09/10/2021 01/02/21   [name], FNP  HYDROcodone-acetaminophen (NORCO/VICODIN) 5-325 MG tablet Take 1 tablet by mouth every 6 (six) hours as needed for moderate pain. 07/25/20   Mar Daring, PA-C    Current Outpatient Medications  Medication Sig Dispense Refill   [START ON 10/02/2021] ALPRAZolam (XANAX) 0.25 MG tablet Take 1 tablet (0.25 mg total) by mouth at bedtime. 30 tablet 0   carvedilol (COREG) 12.5 MG tablet Take 1 tablet (12.5 mg total) by mouth 2 (two) times daily with a meal. 180 tablet 3   cyclobenzaprine (FLEXERIL) 10 MG tablet Take 1 tablet (10 mg total) by mouth at bedtime as needed for muscle spasms. 90 tablet 3   donepezil (ARICEPT) 10 MG tablet Take 1 tablet (10 mg total) by mouth at bedtime. 90 tablet 4   ezetimibe (ZETIA) 10 MG tablet Take 1 tablet (10 mg total) by mouth at bedtime. 90 tablet 1   gabapentin (NEURONTIN) 600 MG tablet Take 1 tablet (600 mg total) by mouth 2 (two) times daily. 180 tablet 2   levothyroxine (SYNTHROID) 112 MCG tablet Take 1 tablet (112 mcg total) by mouth daily before breakfast. 90 tablet 3   losartan (COZAAR) 50 MG tablet TAKE ONE TABLET BY MOUTH AT BEDTIME 90 tablet 1   Magnesium 250 MG TABS Take 1 tablet by mouth daily.     potassium chloride SA (K-DUR) 20 MEQ tablet TAKE 1 TABLET BY MOUTH TWICE DAILY 180 tablet 1   solifenacin (VESICARE) 10 MG tablet Take 1 tablet (10 mg total) by mouth daily. 90 tablet 3   triamterene-hydrochlorothiazide (MAXZIDE) 75-50 MG tablet Take  0.5 tablets by mouth daily. Please contact our office and schedule follow up visit with your primary care doctor for further refills. 30 tablet 0   verapamil (VERELAN PM) 120 MG 24 hr capsule TAKE 1 CAPSULE BY MOUTH EVERY DAY. 90 capsule 3   zolpidem (AMBIEN) 10 MG tablet TAKE ONE TABLET BY MOUTH AT BEDTIME 30 tablet 0   Cholecalciferol (VITAMIN D3) 1.25 MG (50000 UT) CAPS Take 1 capsule by mouth once a week. 12 capsule 3   colestipol (COLESTID) 1 g tablet TAKE 1 TABLET(1 GRAM) BY MOUTH TWICE DAILY (Patient not taking: Reported on 09/10/2021) 60  tablet 5   HYDROcodone-acetaminophen (NORCO/VICODIN) 5-325 MG tablet Take 1 tablet by mouth every 6 (six) hours as needed for moderate pain. 90 tablet 0   Current Facility-Administered Medications  Medication Dose Route Frequency Provider Last Rate Last Admin   0.9 %  sodium chloride infusion  500 mL Intravenous Once Ladene Artist, MD        Allergies as of 09/10/2021 - Review Complete 09/10/2021  Allergen Reaction Noted   Shellfish-derived products Anaphylaxis 12/17/2014   Shellfish allergy  12/17/2014   Latex Rash 07/20/2016    Family History  Problem Relation Age of Onset   Colon cancer Mother    Hypertension Mother    Colon cancer Father    Heart disease Father    Hypertension Father    Colon polyps Neg Hx    Rectal cancer Neg Hx    Stomach cancer Neg Hx    Esophageal cancer Neg Hx     Social History   Socioeconomic History   Marital status: Divorced    Spouse name: Not on file   Number of children: 1   Years of education: college   Highest education level: Not on file  Occupational History   Occupation: book Production designer, theatre/television/film: OTHER    Comment: Paediatric nurse  Tobacco Use   Smoking status: Never   Smokeless tobacco: Never  Vaping Use   Vaping Use: Never used  Substance and Sexual Activity   Alcohol use: Yes    Alcohol/week: 1.0 - 3.0 standard drink    Types: 1 - 3 Glasses of wine per week    Comment:  ocassionally   Drug use: No   Sexual activity: Not Currently  Other Topics Concern   Not on file  Social History Narrative   Patient is right handed, resides with daughter and grandson in her home. Divorced.      Social Determinants of Health   Financial Resource Strain: Low Risk    Difficulty of Paying Living Expenses: Not hard at all  Food Insecurity: No Food Insecurity   Worried About Charity fundraiser in the Last Year: Never true   [place] in the Last Year: Never true  Transportation Needs: No Transportation Needs   Lack of Transportation (Medical): No   Lack of Transportation (Non-Medical): No  Physical Activity: Insufficiently Active   Days of Exercise per Week: 2 days   Minutes of Exercise per Session: 20 min  Stress: No Stress Concern Present   Feeling of Stress : Only a little  Social Connections: Socially Isolated   Frequency of Communication with Friends and Family: More than three times a week   Frequency of Social Gatherings with Friends and Family: Three times a week   Attends Religious Services: Never   Active Member of Clubs or Organizations: No   Attends Archivist Meetings: Never   Marital Status: Widowed  Human resources officer Violence: Not At Risk   Fear of Current or Ex-Partner: No   Emotionally Abused: No   Physically Abused: No   Sexually Abused: No    Review of Systems:  All systems reviewed an negative except where noted in HPI.  Gen: Denies any fever, chills, sweats, anorexia, fatigue, weakness, malaise, weight loss, and sleep disorder CV: Denies chest pain, angina, palpitations, syncope, orthopnea, PND, peripheral edema, and claudication. Resp: Denies dyspnea at rest, dyspnea with exercise, cough, sputum, wheezing, coughing up blood, and pleurisy. GI: Denies vomiting blood, jaundice, and fecal  incontinence.   Denies dysphagia or odynophagia. GU : Denies urinary burning, blood in urine, urinary frequency, urinary hesitancy,  nocturnal urination, and urinary incontinence. MS: Denies joint pain, limitation of movement, and swelling, stiffness, low back pain, extremity pain. Denies muscle weakness, cramps, atrophy.  Derm: Denies rash, itching, dry skin, hives, moles, warts, or unhealing ulcers.  Psych: Denies depression, anxiety, memory loss, suicidal ideation, hallucinations, paranoia, and confusion. Heme: Denies bruising, bleeding, and enlarged lymph nodes. Neuro:  Denies any headaches, dizziness, paresthesias. Endo:  Denies any problems with DM, thyroid, adrenal function.   Physical Exam: General:  Alert, well-developed, in NAD Head:  Normocephalic and atraumatic. Eyes:  Sclera clear, no icterus.   Conjunctiva pink. Ears:  Normal auditory acuity. Mouth:  No deformity or lesions.  Neck:  Supple; no masses . Lungs:  Clear throughout to auscultation.   No wheezes, crackles, or rhonchi. No acute distress. Heart:  Regular rate and rhythm; no murmurs. Abdomen:  Soft, nondistended, nontender. No masses, hepatomegaly. No obvious masses.  Normal bowel .    Rectal:  Deferred   Msk:  Symmetrical without gross deformities.. Pulses:  Normal pulses noted. Extremities:  Without edema. Neurologic:  Alert and  oriented x4;  grossly normal neurologically. Skin:  Intact without significant lesions or rashes. Cervical Nodes:  No significant cervical adenopathy. Psych:  Alert and cooperative. Normal mood and affect.   Impression / Plan:   Iron deficiency anemia, small-volume hematochezia, family history of colon cancer for colonoscopy and EGD.  Pricilla Riffle. Fuller Plan  09/10/2021, 3:10 PM See Shea Evans, [facility], to contact our on call provider

## 2021-09-10 NOTE — Progress Notes (Signed)
Called to room to assist during endoscopic procedure.  Patient ID and intended procedure confirmed with present staff. Received instructions for my participation in the procedure from the performing physician.  

## 2021-09-10 NOTE — Patient Instructions (Addendum)
Handouts Provided:  Polyps, gastritis and esophagitis  YOU HAD AN ENDOSCOPIC PROCEDURE TODAY AT [facility]:   Refer to the procedure report that was given to you for any specific questions about what was found during the examination.  If the procedure report does not answer your questions, please call your gastroenterologist to clarify.  If you requested that your care partner not be given the details of your procedure findings, then the procedure report has been included in a sealed envelope for you to review at your convenience later.  YOU SHOULD EXPECT: Some feelings of bloating in the abdomen. Passage of more gas than usual.  Walking can help get rid of the air that was put into your GI tract during the procedure and reduce the bloating. If you had a lower endoscopy (such as a colonoscopy or flexible sigmoidoscopy) you may notice spotting of blood in your stool or on the toilet paper. If you underwent a bowel prep for your procedure, you may not have a normal bowel movement for a few days.  Please Note:  You might notice some irritation and congestion in your nose or some drainage.  This is from the oxygen used during your procedure.  There is no need for concern and it should clear up in a day or so.  SYMPTOMS TO REPORT IMMEDIATELY:  Following lower endoscopy (colonoscopy or flexible sigmoidoscopy):  Excessive amounts of blood in the stool  Significant tenderness or worsening of abdominal pains  Swelling of the abdomen that is new, acute  Fever of 100F or higher  Following upper endoscopy (EGD)  Vomiting of blood or coffee ground material  New chest pain or pain under the shoulder blades  Painful or persistently difficult swallowing  New shortness of breath  Fever of 100F or higher  Black, tarry-looking stools  For urgent or emergent issues, a gastroenterologist can be reached at any hour by calling [phone]. Do not use MyChart messaging for urgent concerns.     DIET:  We do recommend a small meal at first, but then you may proceed to your regular diet.  Drink plenty of fluids but you should avoid alcoholic beverages for 24 hours.  ACTIVITY:  You should plan to take it easy for the rest of today and you should NOT DRIVE or use heavy machinery until tomorrow (because of the sedation medicines used during the test).    FOLLOW UP: Our staff will call the number listed on your records 48-72 hours following your procedure to check on you and address any questions or concerns that you may have regarding the information given to you following your procedure. If we do not reach you, we will leave a message.  We will attempt to reach you two times.  During this call, we will ask if you have developed any symptoms of COVID 19. If you develop any symptoms (ie: fever, flu-like symptoms, shortness of breath, cough etc.) before then, please call [phone].  If you test positive for Covid 19 in the 2 weeks post procedure, please call and report this information to [place].    If any biopsies were taken you will be contacted by phone or by letter within the next 1-3 weeks.  Please call us at [phone] if you have not heard about the biopsies in 3 weeks.    SIGNATURES/CONFIDENTIALITY: You and/or your care partner have signed paperwork which will be entered into your electronic medical record.  These signatures attest to the fact  that that the information above on your After Visit Summary has been reviewed and is understood.  Full responsibility of the confidentiality of this discharge information lies with you and/or your care-partner.

## 2021-09-10 NOTE — Progress Notes (Signed)
Pt in recovery with monitors in place, VSS. Report given to receiving RN. Bite guard was placed with pt awake to ensure comfort. No dental or soft tissue damage noted. 

## 2021-09-10 NOTE — Op Note (Signed)
[facility] Patient Name: Cathy Barnes Procedure Date: 09/10/2021 3:09 PM MRN: [id number] Endoscopist: Ladene Artist , MD Age: 81 Referring MD:  Date of Birth: 12-Jun-1940 Gender: Female Account #: [id number] Procedure:                Upper GI endoscopy Indications:              Unexplained iron deficiency anemia Medicines:                Monitored Anesthesia Care Procedure:                Pre-Anesthesia Assessment:                           - Prior to the procedure, a History and Physical                            was performed, and patient medications and                            allergies were reviewed. The patient's tolerance of                            previous anesthesia was also reviewed. The risks                            and benefits of the procedure and the sedation                            options and risks were discussed with the patient.                            All questions were answered, and informed consent                            was obtained. Prior Anticoagulants: The patient has                            taken no previous anticoagulant or antiplatelet                            agents. ASA Grade Assessment: III - A patient with                            severe systemic disease. After reviewing the risks                            and benefits, the patient was deemed in                            satisfactory condition to undergo the procedure.                           After obtaining informed consent, the endoscope was  passed under direct vision. Throughout the                            procedure, the patient's blood pressure, pulse, and                            oxygen saturations were monitored continuously. The                            Olympus [id number] was introduced through the mouth,                            and advanced to the second part of duodenum. The                            upper GI  endoscopy was accomplished without                            difficulty. The patient tolerated the procedure                            well. Scope In: Scope Out: Findings:                 LA Grade B (one or more mucosal breaks greater than                            5 mm, not extending between the tops of two mucosal                            folds) esophagitis with no bleeding was found at                            the gastroesophageal junction.                           The exam of the esophagus was otherwise normal.                           Diffuse mildly erythematous mucosa without bleeding                            was found in the gastric body and in the gastric                            antrum. Biopsies were taken with a cold forceps for                            histology.                           The exam of the stomach was otherwise normal.                           The duodenal  bulb and second portion of the                            duodenum were normal. Biopsies for histology were                            taken with a cold forceps for evaluation of celiac                            disease. Complications:            No immediate complications. Estimated Blood Loss:     Estimated blood loss was minimal. Impression:               - LA Grade B reflux esophagitis with no bleeding.                           - Erythematous mucosa in the gastric body and                            antrum. Biopsied.                           - Normal duodenal bulb and second portion of the                            duodenum. Biopsied. Recommendation:           - Patient has a contact number available for                            emergencies. The signs and symptoms of potential                            delayed complications were discussed with the                            patient. Return to normal activities tomorrow.                            Written discharge instructions were  provided to the                            patient.                           - Resume previous diet.                           - Follow antireflux measures.                           - Continue present medications.                           - Pantoprazole 40 mg po qam, 1 year of refills.                           -  Await pathology results. Ladene Artist, MD 09/10/2021 4:02:23 PM This report has been signed electronically.

## 2021-09-10 NOTE — Op Note (Signed)
[place] Patient Name: Cathy Barnes Procedure Date: 09/10/2021 3:16 PM MRN: [id number] Endoscopist: Ladene Artist , MD Age: 81 Referring MD:  Date of Birth: 07/16/1940 Gender: Female Account #: [id number] Procedure:                Colonoscopy Indications:              Hematochezia, Unexplained iron deficiency anemia,                            Family history of colon cancer, 1st-degree relative Medicines:                Monitored Anesthesia Care Procedure:                Pre-Anesthesia Assessment:                           - Prior to the procedure, a History and Physical                            was performed, and patient medications and                            allergies were reviewed. The patient's tolerance of                            previous anesthesia was also reviewed. The risks                            and benefits of the procedure and the sedation                            options and risks were discussed with the patient.                            All questions were answered, and informed consent                            was obtained. Prior Anticoagulants: The patient has                            taken no previous anticoagulant or antiplatelet                            agents. ASA Grade Assessment: III - A patient with                            severe systemic disease. After reviewing the risks                            and benefits, the patient was deemed in                            satisfactory condition to undergo the procedure.  After obtaining informed consent, the colonoscope                            was passed under direct vision. Throughout the                            procedure, the patient's blood pressure, pulse, and                            oxygen saturations were monitored continuously. The                            Olympus #[id number] was introduced through the anus                            and  advanced to the the cecum, identified by                            appendiceal orifice and ileocecal valve. The                            ileocecal valve, appendiceal orifice, and rectum                            were photographed. The quality of the bowel                            preparation was good. The colonoscopy was performed                            without difficulty. The patient tolerated the                            procedure well. Scope In: 3:20:42 PM Scope Out: 3:42:58 PM Scope Withdrawal Time: 0 hours 17 minutes 48 seconds  Total Procedure Duration: 0 hours 22 minutes 16 seconds  Findings:                 The perianal and digital rectal examinations were                            normal.                           Five sessile polyps were found in the rectum (2),                            transverse colon (1) and cecum (2). The polyps were                            6 to 10 mm in size. These polyps were removed with                            a cold snare. Resection and retrieval were complete.  A few small-mouthed diverticula were found in the                            left colon. There was no evidence of diverticular                            bleeding.                           The exam was otherwise without abnormality on                            direct and retroflexion views. Complications:            No immediate complications. Estimated blood loss:                            None. Estimated Blood Loss:     Estimated blood loss: none. Impression:               - Five 6 to 10 mm polyps in the rectum, in the                            transverse colon and in the cecum, removed with a                            cold snare. Resected and retrieved.                           - Mild diverticulosis in the left colon.                           - The examination was otherwise normal on direct                            and retroflexion  views. Recommendation:           - Consider repeat colonoscopy vs no repeat due to                            age after studies are complete for surveillance                            based on pathology results.                           - Patient has a contact number available for                            emergencies. The signs and symptoms of potential                            delayed complications were discussed with the                            patient. Return  to normal activities tomorrow.                            Written discharge instructions were provided to the                            patient.                           - Resume previous diet.                           - Continue present medications.                           - Await pathology results. Ladene Artist, MD 09/10/2021 3:56:56 PM This report has been signed electronically.

## 2021-09-11 ENCOUNTER — Telehealth: Payer: Self-pay

## 2021-09-11 NOTE — Telephone Encounter (Signed)
  Follow up Call-     09/10/2021    2:51 PM  Call back number  Post procedure Call Back phone  # [phone]  Permission to leave phone message Yes     Patient questions:  Do you have a fever, pain , or abdominal swelling? No. Pain Score  0 *  Have you tolerated food without any problems? Yes.    Have you been able to return to your normal activities? Yes.    Do you have any questions about your discharge instructions: Diet   No. Medications  No. Follow up visit  No.  Do you have questions or concerns about your Care? No.  Actions: * If pain score is 4 or above: No action needed, pain <4.

## 2021-09-17 ENCOUNTER — Other Ambulatory Visit: Payer: Self-pay | Admitting: Neurology

## 2021-09-17 ENCOUNTER — Other Ambulatory Visit: Payer: Self-pay | Admitting: Physician Assistant

## 2021-09-17 DIAGNOSIS — I10 Essential (primary) hypertension: Secondary | ICD-10-CM

## 2021-09-18 ENCOUNTER — Telehealth: Payer: Self-pay | Admitting: Oncology

## 2021-09-18 NOTE — Telephone Encounter (Signed)
Left VM with patient and requested she call back to get scheduled for venofer (needs a total of 5).

## 2021-09-22 ENCOUNTER — Telehealth: Payer: Medicare PPO | Admitting: Neurology

## 2021-09-22 ENCOUNTER — Telehealth: Payer: Self-pay | Admitting: Family Medicine

## 2021-09-22 ENCOUNTER — Encounter: Payer: Self-pay | Admitting: Gastroenterology

## 2021-09-22 ENCOUNTER — Telehealth: Payer: Self-pay | Admitting: Neurology

## 2021-09-22 DIAGNOSIS — M25572 Pain in left ankle and joints of left foot: Secondary | ICD-10-CM | POA: Diagnosis not present

## 2021-09-22 DIAGNOSIS — G3184 Mild cognitive impairment, so stated: Secondary | ICD-10-CM

## 2021-09-22 MED ORDER — CYCLOBENZAPRINE HCL 10 MG PO TABS
10.0000 mg | ORAL_TABLET | Freq: Every evening | ORAL | 3 refills | Status: DC | PRN
Start: 2021-09-22 — End: 2022-09-24

## 2021-09-22 MED ORDER — DONEPEZIL HCL 10 MG PO TABS: 10.0000 mg | ORAL_TABLET | Freq: Every day | ORAL | 4 refills | Status: DC

## 2021-09-22 NOTE — Telephone Encounter (Signed)
[facility] faxed refill request for the following medications:  losartan (COZAAR) 50 MG tablet   Please advise.

## 2021-09-22 NOTE — Telephone Encounter (Signed)
Would you call patient and schedule a yearly follow up in the office with me in a year please? For Mild Cognitive Impairment follow up thanks

## 2021-09-22 NOTE — Progress Notes (Signed)
Cathy Barnes    Provider:  Dr Jaynee Barnes Referring Provider: Florian Barnes* Primary Care Physician:  Cathy Barnes  CC:  Memory loss  Virtual Visit via Telephone Note (patient left the video visit and I called her on the phone instead)  I connected with Cathy Barnes on 09/22/21 at 11:00 AM EDT by telephone and verified that I am speaking with the correct person using two identifiers.  Location: Patient: home Provider: office   I discussed the limitations, risks, security and privacy concerns of performing an evaluation and management service by telephone and the availability of in person appointments. I also discussed with the patient that there may be a patient responsible charge related to this service. The patient expressed understanding and agreed to proceed.   Follow Up Instructions:    I discussed the assessment and treatment plan with the patient. The patient was provided an opportunity to ask questions and all were answered. The patient agreed with the plan and demonstrated an understanding of the instructions.   The patient was advised to call back or seek an in-person evaluation if the symptoms worsen or if the condition fails to improve as anticipated.  I provided over 22 minutes of non-face-to-face time during this encounter.   Cathy Beam, MD  09/22/2021: he feels she is doing well. Memory is stable. Her daughtre and grandson still living with her. We discussed Aricept, she was not diagnosed with Alzheimer's so we could stop it or continue based on her feelings, no side effects, she would like to continue. At this time don't feel like she needs repeat formal testing. Primary care can refill aricept and flexeril and she can be returned to primary care.   Patient complains of symptoms per HPI as well as the following symptoms: stable MCI . Pertinent negatives and positives per HPI. All others negative   09/18/2020: - Formal  memory testing was not consistent with any significant pattern of cognitive ecline or impairment. Cathy Barnes 01/2019. In fact the patient did exceptionally well. Diagnosed with mild cognitive impairment which may be related to sleep disturbance and medication effects (ambien, xanax) as well as anxiety. She feels her memory is stable. Her son in law passed away so she is living with her daughter and grandson. She has not had covid, she has been immunized,    08/23/2018  She has been seeing [place] for multiple years for memory and she was referred to formal memory testing last year but could not get it completed will reorder. We decreased her Topiramate. She also has migraines. She is on Aricept. She had a sleep test in the past and no signs or symptoms of OSA. Also recommended decreasing Gabapentin and other meds. She were continuing to work until recently due to [place]. She hopes to go back to work when reopen. Migraines are well controlled.    Interval history 06/30/2017: Patient's MMSE again 30/30 today. She feels her memory is still worsening, she has extensive family history of dementia on both sides. . She tripped over a cord and hit the door frame when she was working. Her nose was possibly broken, she had bruising all over the face. She did not go to the emergency room. No headaches. We titrated her down on the Topiramate because of memory complaints on multiple medications that could cause symptoms. No concussive symptoms.    Interval history 11/14/2015: Patient comes back today. She is feeling much better as far as memory loss  go since decreasing the Topamax from 200-100. She sees no changes in her migraines 100 mg. We'll further decrease Topamax to 50 mg. 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 Montral cognitive assessment score was 23 but Mini-Mental status exam was 30 out of 30. We have clinical trial here, [facility]READ, discussed at length with patient and he should be a wonderful candidate  for this clinical trial which includes imaging and no cost patient including PET amyloid scans which would be very interesting especially in this patient's case. I discussed this at length, in the research team also came in and discuss with her provided brochures. We will continue to follow her every 6 months.   Interval history 05/06/2015: She is having eye twitching with the Aricept, she had diarrhea with '10mg'$  and she went back to '5mg'$ . Reviewed labs and MRI images with patient and compared them to 2010 MRI which is mostly stable. Showed chronic microvascular ischemic changes, discussed vascular risk factors and treatment.    She has migraines, every other day last 4-6 hours, unilateral, either side, throbbing and pulsatin with light sensitivity, sounds sensitivity, nausea no vomiting. No aura. No medication obveruse. On Topamax. On Atenolol. Failed Depakote, Pamelor and Elavil, she has failed imitrex oral and takes shots which may or may not help. She can't work, they can be severe 10/10 on average 5-6/10. At least 16 or more migraines a month. For the last 5-10 years or longer.    HPI:  Cathy Barnes is a 81 y.o. female here as a referral from Cathy. Venia Barnes for memory loss. PMHx of migraine, HTN, essential tremor. She is a patient of Cathy Barnes and Cathy Barnes and is seeing me today. Symptoms started within the last 2 months. She is having problems with numbers. She is having some short-term memory changes but most significantly with numbers. 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 She is scared, something is not right. Started acutely and getting worse.  She took some prednisone for her foot and then this problem started. That may be a coincidence. She has stopped the steroids. No new medication changes, nothing new. No head trauma or inciting events. No other short-term memory  changes except sometimes she forgets little things when she is very tired. Her TSH is monitored by pcp for hypothyroidism. No snoring at night, not excessively tired during the day, she had a sleep test in the past. No alteration of consciousness. No other focal neurologic symptoms.     Review of Systems: Patient complains of symptoms per HPI as well as the following symptoms: memory, recent stressors . Pertinent negatives and positives per HPI. All others negative .  Social History   Socioeconomic History   Marital status: Divorced    Spouse name: Not on file   Number of children: 1   Years of education: college   Highest education level: Not on file  Occupational History   Occupation: book Production designer, theatre/television/film: OTHER    Comment: Paediatric nurse  Tobacco Use   Smoking status: Never   Smokeless tobacco: Never  Vaping Use   Vaping Use: Never used  Substance and Sexual Activity   Alcohol use: Yes    Alcohol/week: 1.0 - 3.0 standard drink    Types: 1 - 3 Glasses of wine per week  Comment: ocassionally   Drug use: No   Sexual activity: Not Currently  Other Topics Concern   Not on file  Social History Narrative   Patient is right handed, resides with daughter and grandson in her home. Divorced.      Social Determinants of Health   Financial Resource Strain: Low Risk    Difficulty of Paying Living Expenses: Not hard at all  Food Insecurity: No Food Insecurity   Worried About Charity fundraiser in the Last Year: Never true   [place] in the Last Year: Never true  Transportation Needs: No Transportation Needs   Lack of Transportation (Medical): No   Lack of Transportation (Non-Medical): No  Physical Activity: Insufficiently Active   Days of Exercise per Week: 2 days   Minutes of Exercise per Session: 20 min  Stress: No Stress Concern Present   Feeling of Stress : Only a little  Social Connections: Socially Isolated   Frequency of Communication with Friends  and Family: More than three times a week   Frequency of Social Gatherings with Friends and Family: Three times a week   Attends Religious Services: Never   Active Member of Clubs or Organizations: No   Attends Archivist Meetings: Never   Marital Status: Widowed  Human resources officer Violence: Not At Risk   Fear of Current or Ex-Partner: No   Emotionally Abused: No   Physically Abused: No   Sexually Abused: No    Family History  Problem Relation Age of Onset   Colon cancer Mother    Hypertension Mother    Colon cancer Father    Heart disease Father    Hypertension Father    Colon polyps Neg Hx    Rectal cancer Neg Hx    Stomach cancer Neg Hx    Esophageal cancer Neg Hx     Past Medical History:  Diagnosis Date   Allergy    some   Cataract    bilaterally both eyes    Chest pain    a. 07/2019 MV: EF 77%, no ischemia/infarct.   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 08/2019 Echo: EF 55-60%, no rwma, Gr1 DD, nl RV size/fxn.   Gallstones    GERD (gastroesophageal reflux disease)    H/O blood clots    Hydrocephalus (HCC)    [facility]    Hyperglycemia    Hyperlipidemia    Hypertension    Lymphocytosis    monoclonal b cell   Migraine, unspecified, without mention of intractable migraine without mention of status migrainosus 12/14/2012   Osteoarthritis, chronic    Osteoporosis    Peptic ulcer    some bleeding with ulcers as well     Past Surgical History:  Procedure Laterality Date   ABDOMINAL HYSTERECTOMY     BREAST CYST ASPIRATION Left 1993   Removed   breast cyst removed Right    benign    BREAST LUMPECTOMY Bilateral    COLONOSCOPY     ERCP N/A 02/04/2018   Procedure: ENDOSCOPIC RETROGRADE CHOLANGIOPANCREATOGRAPHY (ERCP);  Surgeon: Lucilla Lame, MD;  Location: [facility] ENDOSCOPY;  Service: Endoscopy;  Laterality: N/A;   ERCP N/A 02/28/2018   Procedure: ENDOSCOPIC RETROGRADE CHOLANGIOPANCREATOGRAPHY (ERCP)  STENT REMOVAL;  Surgeon: Lucilla Lame, MD;  Location: [facility] ENDOSCOPY;  Service: Endoscopy;  Laterality: N/A;   EYE SURGERY     growth removal Left 2019   L wrist growth removed, abnormal  cells   History of Cataract surgery Right 2011   left 2011   PARTIAL HYSTERECTOMY     POLYPECTOMY     ROBOTIC ASSISTED LAPAROSCOPIC CHOLECYSTECTOMY-MULTI SITE N/A 03/08/2018   Procedure: ROBOTIC ASSISTED LAPAROSCOPIC CHOLECYSTECTOMY-MULTI SITE;  Surgeon: Jules Husbands, MD;  Location: [facility] ORS;  Service: General;  Laterality: N/A;   S/P ACL repair Left    knee   TONSILLECTOMY     TUBAL LIGATION  1975   Bilateral    Current Outpatient Medications  Medication Sig Dispense Refill   [START ON 10/02/2021] ALPRAZolam (XANAX) 0.25 MG tablet Take 1 tablet (0.25 mg total) by mouth at bedtime. 30 tablet 0   carvedilol (COREG) 12.5 MG tablet Take 1 tablet (12.5 mg total) by mouth 2 (two) times daily with a meal. 180 tablet 3   Cholecalciferol (VITAMIN D3) 1.25 MG (50000 UT) CAPS Take 1 capsule by mouth once a week. 12 capsule 3   cyclobenzaprine (FLEXERIL) 10 MG tablet Take 1 tablet (10 mg total) by mouth at bedtime as needed for muscle spasms. 90 tablet 3   donepezil (ARICEPT) 10 MG tablet Take 1 tablet (10 mg total) by mouth at bedtime. 90 tablet 4   ezetimibe (ZETIA) 10 MG tablet Take 1 tablet (10 mg total) by mouth at bedtime. 90 tablet 1   gabapentin (NEURONTIN) 600 MG tablet Take 1 tablet (600 mg total) by mouth 2 (two) times daily. 180 tablet 2   HYDROcodone-acetaminophen (NORCO/VICODIN) 5-325 MG tablet Take 1 tablet by mouth every 6 (six) hours as needed for moderate pain. 90 tablet 0   levothyroxine (SYNTHROID) 112 MCG tablet Take 1 tablet (112 mcg total) by mouth daily before breakfast. 90 tablet 3   losartan (COZAAR) 50 MG tablet TAKE ONE TABLET BY MOUTH AT BEDTIME 90 tablet 1   Magnesium 250 MG TABS Take 1 tablet by mouth daily.     pantoprazole (PROTONIX) 40 MG tablet Take 1 tablet (40 mg total) by  mouth in the morning. 90 tablet 3   potassium chloride SA (K-DUR) 20 MEQ tablet TAKE 1 TABLET BY MOUTH TWICE DAILY 180 tablet 1   solifenacin (VESICARE) 10 MG tablet Take 1 tablet (10 mg total) by mouth daily. 90 tablet 3   triamterene-hydrochlorothiazide (MAXZIDE) 75-50 MG tablet Take 0.5 tablets by mouth daily. Please contact our office and schedule follow up visit with your primary care doctor for further refills. 30 tablet 0   verapamil (VERELAN PM) 120 MG 24 hr capsule TAKE 1 CAPSULE BY MOUTH EVERY DAY. 90 capsule 3   zolpidem (AMBIEN) 10 MG tablet TAKE ONE TABLET BY MOUTH AT BEDTIME 30 tablet 0   No current facility-administered medications for this visit.    Allergies as of 09/22/2021 - Review Complete 09/10/2021  Allergen Reaction Noted   Shellfish-derived products Anaphylaxis 12/17/2014   Shellfish allergy  12/17/2014   Latex Rash 07/20/2016    Vitals: There were no vitals taken for this visit. Last Weight:  Wt Readings from Last 1 Encounters:  09/10/21 201 lb (91.2 kg)   Last Height:   Ht Readings from Last 1 Encounters:  09/10/21 '5\' 5"'$  (1.651 m)      09/18/2020    8:37 AM 07/05/2017    1:37 PM 05/18/2016    1:52 PM  MMSE - Mini Mental State Exam  Orientation to time '5 5 5  '$ Orientation to Place '4 5 5  '$ Registration '3 3 3  '$ Attention/ Calculation '5 5 5  '$ Recall 3 3 3  Language- name 2 objects '2 2 2  '$ Language- repeat '1 1 1  '$ Language- follow 3 step command '3 3 3  '$ Language- read & follow direction '1 1 1  '$ Write a sentence '1 1 1  '$ Copy design '1 1 1  '$ Total score '29 30 30      '$ 02/07/2015    9:50 AM  Montreal Cognitive Assessment   Visuospatial/ Executive (0/5) 3  Naming (0/3) 2  Attention: Read list of digits (0/2) 2  Attention: Read list of letters (0/1) 1  Attention: Serial 7 subtraction starting at 100 (0/3) 1  Language: Repeat phrase (0/2) 0  Language : Fluency (0/1) 1  Abstraction (0/2) 2  Delayed Recall (0/5) 5  Orientation (0/6) 6  Total 23  Adjusted  Score (based on education) 23  Physical exam: Exam: Gen: NAD, conversant, pleasant  Neuro: Detailed Neurologic Exam  Speech:    Speech is normal; fluent and spontaneous with normal comprehension.  Cognition:    The patient is oriented to person, place, and time;     recent and remote memory intact;     language fluent;     normal attention, concentration,     fund of knowledge     Assessment/Plan:  81 y.o. Absolutely lovely female here as a referral from Cathy. Venia Barnes for memory loss. MMSE 29-30/30 but MoCA 23/30, stable mild cognitive impairment. Could also very well be medication related as she is on a lot of medications that can cause cognitive problems such as  flexeril,(she takes nightly, I refilled with caution to patient can cause drowsiness and falls) neurontin, alprazolam.  we have been seeing this very lovely patient for many years who continues to feel impaired and has a FHx of dementia on both sides of the family. However today she states she feels stable, declines repeat formal memory testing, can return to pcp and see [place] as needed.  - Formal memory testing was not consistent with any significant pattern of cognitive ecline or impairment. Cathy Barnes 01/2019. In fact the patient did exceptionally well. Diagnosed with mild cognitive impairment which may be related to sleep disturbance and medication effects (ambien, xanax) as well as anxiety.. - We decreased her Topiramate, migraines stable. - She is on Aricept can continue, she would like to continue at this time - She had a sleep test in the past and no signs or symptoms of OSA. - - recommended decreasing Gabapentin and other meds that may cause cognitive dysfunction. She has been on flexeril for a long time for muscle spasms, refilled with caution to patient as above. - MRi of the brain unremarkable, chronic microvascular ischemic white matter changes. Discussed vascular risk factors and trestment (BP control, diet and  exercise, statins, watching glucose) - B12, tsh unremarkable    Sarina Ill, MD   [facility] [address] [place], [place] [address]   Phone [phone] Fax [phone]

## 2021-09-29 ENCOUNTER — Telehealth: Payer: Self-pay | Admitting: Family Medicine

## 2021-09-29 ENCOUNTER — Telehealth: Payer: Self-pay | Admitting: Oncology

## 2021-09-29 DIAGNOSIS — F5101 Primary insomnia: Secondary | ICD-10-CM

## 2021-09-29 DIAGNOSIS — E7800 Pure hypercholesterolemia, unspecified: Secondary | ICD-10-CM

## 2021-09-29 NOTE — Telephone Encounter (Signed)
Pt LVM to get on the schedule for Venofer, Spoke with pt who stated she needed to speak with Anderson Malta to schedule .Marland KitchenKJ

## 2021-09-29 NOTE — Telephone Encounter (Signed)
[facility] faxed refill request for the following medications:  solifenacin (VESICARE) 10 MG tablet   gabapentin (NEURONTIN) 600 MG tablet   Please advise.

## 2021-09-30 ENCOUNTER — Other Ambulatory Visit: Payer: Self-pay | Admitting: Oncology

## 2021-09-30 DIAGNOSIS — D508 Other iron deficiency anemias: Secondary | ICD-10-CM

## 2021-09-30 DIAGNOSIS — D509 Iron deficiency anemia, unspecified: Secondary | ICD-10-CM | POA: Insufficient documentation

## 2021-09-30 NOTE — Telephone Encounter (Signed)
Pt states [facility] asked her to call to follow up on Rx request.

## 2021-10-01 ENCOUNTER — Other Ambulatory Visit: Payer: Self-pay | Admitting: Family Medicine

## 2021-10-01 DIAGNOSIS — G588 Other specified mononeuropathies: Secondary | ICD-10-CM

## 2021-10-01 DIAGNOSIS — I10 Essential (primary) hypertension: Secondary | ICD-10-CM

## 2021-10-01 DIAGNOSIS — N3281 Overactive bladder: Secondary | ICD-10-CM

## 2021-10-01 MED ORDER — GABAPENTIN 600 MG PO TABS: 600.0000 mg | ORAL_TABLET | Freq: Two times a day (BID) | ORAL | 0 refills | Status: DC

## 2021-10-01 MED ORDER — SOLIFENACIN SUCCINATE 10 MG PO TABS: 10.0000 mg | ORAL_TABLET | Freq: Every day | ORAL | 0 refills | Status: DC

## 2021-10-01 MED ORDER — LOSARTAN POTASSIUM 50 MG PO TABS: 50.0000 mg | ORAL_TABLET | Freq: Every day | ORAL | 0 refills | Status: DC

## 2021-10-01 NOTE — Telephone Encounter (Signed)
We received another fax from [facility] for   gabapentin (NEURONTIN) 600 MG tablet   solifenacin (VESICARE) 10 MG tablet  It looks like they were not added to this refill request when [place] sent it back on 09/29/2021.

## 2021-10-03 ENCOUNTER — Ambulatory Visit: Payer: Medicare PPO | Admitting: Family Medicine

## 2021-10-03 VITALS — BP 139/60 | HR 63 | Temp 97.5°F | Resp 18 | Ht 65.0 in | Wt 201.4 lb

## 2021-10-03 DIAGNOSIS — F5104 Psychophysiologic insomnia: Secondary | ICD-10-CM | POA: Diagnosis not present

## 2021-10-03 DIAGNOSIS — I10 Essential (primary) hypertension: Secondary | ICD-10-CM

## 2021-10-03 DIAGNOSIS — N3281 Overactive bladder: Secondary | ICD-10-CM

## 2021-10-03 MED ORDER — SOLIFENACIN SUCCINATE 10 MG PO TABS: 10.0000 mg | ORAL_TABLET | Freq: Every day | ORAL | 3 refills | Status: DC

## 2021-10-03 MED ORDER — LOSARTAN POTASSIUM 50 MG PO TABS: 50.0000 mg | ORAL_TABLET | Freq: Every day | ORAL | 3 refills | Status: DC

## 2021-10-03 MED ORDER — ALPRAZOLAM 0.25 MG PO TABS: 0.2500 mg | ORAL_TABLET | Freq: Every day | ORAL | 5 refills | Status: DC

## 2021-10-03 NOTE — Assessment & Plan Note (Signed)
Chronic, stable; <140/<90 today in office Denies CP Denies SOB/ DOE Denies low blood pressure/hypotension Denies vision changes Denies LE Edema Continue medication, Coreg 12.5 BID, Losartan 50 mg qHS, Verapamil 120 mg 1/day Denies side effects Due for CPE in 01/2022 Seek emergent care if you develop chest pain or chest pressure

## 2021-10-03 NOTE — Progress Notes (Signed)
Established patient visit   [name]   DOB: 03-02-1941   81 y.o. Female  MRN: [id number] Visit Date: 10/03/2021  Today's healthcare provider: [name], FNP  Introduced to nurse practitioner role and practice setting.  All questions answered.  Discussed provider/patient relationship and expectations.   I,Cathy Barnes,acting as a scribe for [name], FNP.,have documented all relevant documentation on the behalf of [name], FNP,as directed by  [name], FNP while in the presence of [name], FNP.   Chief Complaint  Patient presents with   Hypertension   Subjective    HPI  Hypertension, follow-up  BP Readings from Last 3 Encounters:  10/03/21 139/60  09/10/21 (!) 146/49  08/06/21 136/68   Wt Readings from Last 3 Encounters:  10/03/21 201 lb 6.4 oz (91.4 kg)  09/10/21 201 lb (91.2 kg)  08/06/21 201 lb 4 oz (91.3 kg)     She was last seen for hypertension 8 months ago.  BP at that visit was 150/82. Management since that visit includes continue medications.  She reports excellent compliance with treatment. She is not having side effects.  She is following a Regular diet. She is not exercising. She does not smoke.  Use of agents associated with hypertension: none.   Outside blood pressures are around 145/80 Symptoms: No chest pain No chest pressure  No palpitations No syncope  No dyspnea No orthopnea  No paroxysmal nocturnal dyspnea No lower extremity edema   Pertinent labs Lab Results  Component Value Date   CHOL 183 07/01/2020   HDL 48 07/01/2020   LDLCALC 100 (H) 07/01/2020   TRIG 173 (H) 07/01/2020   CHOLHDL 3.8 07/01/2020   Lab Results  Component Value Date   NA 136 06/16/2021   K 3.7 06/16/2021   CREATININE 1.22 (H) 06/16/2021   GFRNONAA 45 (L) 06/16/2021   GLUCOSE 98 06/16/2021   TSH 0.924 08/04/2021     The 10-year ASCVD risk score (Arnett DK, et al., 2019) is:  35.1%  ---------------------------------------------------------------------------------------------------  Hypothyroid, follow-up  Lab Results  Component Value Date   TSH 0.924 08/04/2021   TSH 2.621 07/01/2020   TSH 11.600 (H) 07/26/2019   FREET4 1.14 07/26/2019   T4TOTAL 8.4 07/01/2020    Wt Readings from Last 3 Encounters:  10/03/21 201 lb 6.4 oz (91.4 kg)  09/10/21 201 lb (91.2 kg)  08/06/21 201 lb 4 oz (91.3 kg)    She was last seen for hypothyroid 8 months ago.  Management since that visit includes continue medications. She reports excellent compliance with treatment. She is not having side effects.   Symptoms: Yes change in energy level No constipation  No diarrhea No heat / cold intolerance  No nervousness No palpitations  No weight changes    Of note, begins 6 course treatment for leukemia on 10/06/21 with [name], MD -----------------------------------------------------------------------------------------   Medications: Outpatient Medications Prior to Visit  Medication Sig   carvedilol (COREG) 12.5 MG tablet Take 1 tablet (12.5 mg total) by mouth 2 (two) times daily with a meal.   Cholecalciferol (VITAMIN D3) 1.25 MG (50000 UT) CAPS Take 1 capsule by mouth once a week.   cyclobenzaprine (FLEXERIL) 10 MG tablet Take 1 tablet (10 mg total) by mouth at bedtime as needed for muscle spasms.   donepezil (ARICEPT) 10 MG tablet Take 1 tablet (10 mg total) by mouth at bedtime.   ezetimibe (ZETIA) 10 MG tablet TAKE ONE TABLET  BY MOUTH AT BEDTIME   gabapentin (NEURONTIN) 600 MG tablet Take 1 tablet (600 mg total) by mouth 2 (two) times daily.   HYDROcodone-acetaminophen (NORCO/VICODIN) 5-325 MG tablet Take 1 tablet by mouth every 6 (six) hours as needed for moderate pain.   levothyroxine (SYNTHROID) 112 MCG tablet Take 1 tablet (112 mcg total) by mouth daily before breakfast.   Magnesium 250 MG TABS Take 1 tablet by mouth daily.   pantoprazole (PROTONIX) 40 MG tablet Take 1  tablet (40 mg total) by mouth in the morning.   potassium chloride SA (K-DUR) 20 MEQ tablet TAKE 1 TABLET BY MOUTH TWICE DAILY   triamterene-hydrochlorothiazide (MAXZIDE) 75-50 MG tablet Take 0.5 tablets by mouth daily. Please contact our office and schedule follow up visit with your primary care doctor for further refills.   verapamil (VERELAN PM) 120 MG 24 hr capsule TAKE 1 CAPSULE BY MOUTH EVERY DAY.   zolpidem (AMBIEN) 10 MG tablet TAKE ONE TABLET BY MOUTH AT BEDTIME   [DISCONTINUED] ALPRAZolam (XANAX) 0.25 MG tablet TAKE ONE TABLET BY MOUTH AT BEDTIME   [DISCONTINUED] losartan (COZAAR) 50 MG tablet Take 1 tablet (50 mg total) by mouth at bedtime.   [DISCONTINUED] solifenacin (VESICARE) 10 MG tablet Take 1 tablet (10 mg total) by mouth daily.   No facility-administered medications prior to visit.    Review of Systems  Last CBC Lab Results  Component Value Date   WBC 16.5 (H) 08/04/2021   HGB 13.1 08/04/2021   HCT 40.1 08/04/2021   MCV 89.5 08/04/2021   MCH 29.2 08/04/2021   RDW 13.8 08/04/2021   PLT 295 58/30/9407   Last metabolic panel Lab Results  Component Value Date   GLUCOSE 98 06/16/2021   NA 136 06/16/2021   K 3.7 06/16/2021   CL 98 06/16/2021   CO2 31 06/16/2021   BUN 15 06/16/2021   CREATININE 1.22 (H) 06/16/2021   GFRNONAA 45 (L) 06/16/2021   CALCIUM 9.1 06/16/2021   PROT 6.9 06/16/2021   ALBUMIN 3.9 06/16/2021   LABGLOB 2.6 02/02/2018   AGRATIO 1.7 02/02/2018   BILITOT 0.3 06/16/2021   ALKPHOS 82 06/16/2021   AST 23 06/16/2021   ALT 23 06/16/2021   ANIONGAP 7 06/16/2021   Last lipids Lab Results  Component Value Date   CHOL 183 07/01/2020   HDL 48 07/01/2020   LDLCALC 100 (H) 07/01/2020   TRIG 173 (H) 07/01/2020   CHOLHDL 3.8 07/01/2020   Last hemoglobin A1c Lab Results  Component Value Date   HGBA1C 6.2 (H) 07/26/2019   Last thyroid functions Lab Results  Component Value Date   TSH 0.924 08/04/2021   T4TOTAL 8.4 07/01/2020   Last  vitamin D Lab Results  Component Value Date   VD25OH 26.8 (L) 07/26/2019   Last vitamin B12 and Folate Lab Results  Component Value Date   VITAMINB12 414 08/04/2021   FOLATE 10.4 02/07/2015       Objective    BP 139/60 (BP Location: Right Arm, Patient Position: Sitting, Cuff Size: Large)   Pulse 63   Temp (!) 97.5 F (36.4 C) (Oral)   Resp 18   Ht '5\' 5"'$  (1.651 m)   Wt 201 lb 6.4 oz (91.4 kg)   SpO2 95%   BMI 33.51 kg/m   BP Readings from Last 3 Encounters:  10/03/21 139/60  09/10/21 (!) 146/49  08/06/21 136/68   Wt Readings from Last 3 Encounters:  10/03/21 201 lb 6.4 oz (91.4 kg)  09/10/21 201 lb (  91.2 kg)  08/06/21 201 lb 4 oz (91.3 kg)   SpO2 Readings from Last 3 Encounters:  10/03/21 95%  09/10/21 94%  08/04/21 97%      Physical Exam Vitals and nursing note reviewed.  Constitutional:      General: She is not in acute distress.    Appearance: Normal appearance. She is obese. She is not ill-appearing, toxic-appearing or diaphoretic.  HENT:     Head: Normocephalic and atraumatic.  Cardiovascular:     Rate and Rhythm: Normal rate and regular rhythm.     Pulses: Normal pulses.     Heart sounds: Normal heart sounds. No murmur heard.    No friction rub. No gallop.  Pulmonary:     Effort: Pulmonary effort is normal. No respiratory distress.     Breath sounds: Normal breath sounds. No stridor. No wheezing, rhonchi or rales.  Chest:     Chest wall: No tenderness.  Musculoskeletal:        General: No swelling, tenderness, deformity or signs of injury. Normal range of motion.     Right lower leg: No edema.     Left lower leg: No edema.  Skin:    General: Skin is warm and dry.     Capillary Refill: Capillary refill takes less than 2 seconds.     Coloration: Skin is not jaundiced or pale.     Findings: No bruising, erythema, lesion or rash.  Neurological:     General: No focal deficit present.     Mental Status: She is alert and oriented to person, place,  and time. Mental status is at baseline.     Cranial Nerves: No cranial nerve deficit.     Sensory: No sensory deficit.     Motor: No weakness.     Coordination: Coordination normal.  Psychiatric:        Mood and Affect: Mood normal.        Behavior: Behavior normal.        Thought Content: Thought content normal.        Judgment: Judgment normal.      No results found for any visits on 10/03/21.  Assessment & Plan     Problem List Items Addressed This Visit       Cardiovascular and Mediastinum   Essential (primary) hypertension - Primary    Chronic, stable; <140/<90 today in office Denies CP Denies SOB/ DOE Denies low blood pressure/hypotension Denies vision changes Denies LE Edema Continue medication, Coreg 12.5 BID, Losartan 50 mg qHS, Verapamil 120 mg 1/day Denies side effects Due for CPE in 01/2022 Seek emergent care if you develop chest pain or chest pressure       Relevant Medications   losartan (COZAAR) 50 MG tablet     Genitourinary   Overactive bladder    Chronic, stable Continues to use Vesicare 10 mg daily to assist symptoms Has previously seen an urologist in [place]  Will refer back if necessary       Relevant Medications   solifenacin (VESICARE) 10 MG tablet     Other   Psychophysiologic insomnia    Chronic, stable Request for continued use of PRN qHS Xanax 0.25 mg to assist Pt is aware of risks of psychoactive medication use to include increased sedation, respiratory suppression, falls, extrapyramidal movements,  dependence and cardiovascular events.  Pt would like to continue treatment as benefit determined to outweigh risk.         Relevant Medications   ALPRAZolam (XANAX) 0.25  MG tablet     Return in about 4 months (around 02/02/2022) for annual examination.      Vonna Kotyk, FNP, have reviewed all documentation for this visit. The documentation on 10/03/21 for the exam, diagnosis, procedures, and orders are all accurate and  complete.    [name], [place] [phone] (phone) [phone] (fax)  [place]

## 2021-10-03 NOTE — Assessment & Plan Note (Signed)
Chronic, stable Continues to use Vesicare 10 mg daily to assist symptoms Has previously seen an urologist in [place]  Will refer back if necessary

## 2021-10-03 NOTE — Assessment & Plan Note (Signed)
Chronic, stable Request for continued use of PRN qHS Xanax 0.25 mg to assist Pt is aware of risks of psychoactive medication use to include increased sedation, respiratory suppression, falls, extrapyramidal movements,  dependence and cardiovascular events.  Pt would like to continue treatment as benefit determined to outweigh risk.

## 2021-10-06 ENCOUNTER — Inpatient Hospital Stay: Payer: Medicare PPO | Attending: Oncology

## 2021-10-06 VITALS — BP 128/45 | HR 61 | Temp 97.0°F | Resp 19

## 2021-10-06 DIAGNOSIS — D509 Iron deficiency anemia, unspecified: Secondary | ICD-10-CM | POA: Diagnosis not present

## 2021-10-06 DIAGNOSIS — D508 Other iron deficiency anemias: Secondary | ICD-10-CM

## 2021-10-06 MED ORDER — SODIUM CHLORIDE 0.9 % IV SOLN
Freq: Once | INTRAVENOUS | Status: AC
  Filled 2021-10-06: qty 250

## 2021-10-06 MED ORDER — SODIUM CHLORIDE 0.9 % IV SOLN: 200.0000 mg | INTRAVENOUS | Status: DC

## 2021-10-06 MED ORDER — IRON SUCROSE 20 MG/ML IV SOLN
200.0000 mg | Freq: Once | INTRAVENOUS | Status: AC
  Administered 2021-10-06: 200 mg via INTRAVENOUS
  Filled 2021-10-06: qty 10

## 2021-10-06 NOTE — Patient Instructions (Signed)

## 2021-10-08 ENCOUNTER — Inpatient Hospital Stay: Payer: Medicare PPO

## 2021-10-08 VITALS — BP 127/60 | HR 67 | Temp 97.6°F | Resp 16

## 2021-10-08 DIAGNOSIS — D508 Other iron deficiency anemias: Secondary | ICD-10-CM

## 2021-10-08 DIAGNOSIS — D509 Iron deficiency anemia, unspecified: Secondary | ICD-10-CM | POA: Diagnosis not present

## 2021-10-08 MED ORDER — IRON SUCROSE 20 MG/ML IV SOLN
200.0000 mg | Freq: Once | INTRAVENOUS | Status: AC
  Administered 2021-10-08: 200 mg via INTRAVENOUS
  Filled 2021-10-08: qty 10

## 2021-10-08 MED ORDER — SODIUM CHLORIDE 0.9 % IV SOLN: 200.0000 mg | INTRAVENOUS | Status: DC

## 2021-10-08 MED ORDER — SODIUM CHLORIDE 0.9 % IV SOLN
Freq: Once | INTRAVENOUS | Status: AC
  Filled 2021-10-08: qty 250

## 2021-10-08 NOTE — Patient Instructions (Signed)
Iron deficiency

## 2021-10-10 ENCOUNTER — Inpatient Hospital Stay: Payer: Medicare PPO

## 2021-10-10 VITALS — BP 139/64 | HR 64 | Temp 97.5°F | Resp 16

## 2021-10-10 DIAGNOSIS — D508 Other iron deficiency anemias: Secondary | ICD-10-CM

## 2021-10-10 DIAGNOSIS — D509 Iron deficiency anemia, unspecified: Secondary | ICD-10-CM | POA: Diagnosis not present

## 2021-10-10 MED ORDER — SODIUM CHLORIDE 0.9 % IV SOLN: 200.0000 mg | INTRAVENOUS | Status: DC

## 2021-10-10 MED ORDER — SODIUM CHLORIDE 0.9 % IV SOLN
Freq: Once | INTRAVENOUS | Status: AC
  Filled 2021-10-10: qty 250

## 2021-10-10 MED ORDER — IRON SUCROSE 20 MG/ML IV SOLN
200.0000 mg | Freq: Once | INTRAVENOUS | Status: AC
  Administered 2021-10-10: 200 mg via INTRAVENOUS
  Filled 2021-10-10: qty 10

## 2021-10-11 ENCOUNTER — Other Ambulatory Visit: Payer: Self-pay | Admitting: Family Medicine

## 2021-10-11 DIAGNOSIS — F5101 Primary insomnia: Secondary | ICD-10-CM

## 2021-10-13 ENCOUNTER — Inpatient Hospital Stay: Payer: Medicare PPO

## 2021-10-13 VITALS — BP 136/64 | HR 81 | Temp 97.0°F | Resp 20

## 2021-10-13 DIAGNOSIS — D509 Iron deficiency anemia, unspecified: Secondary | ICD-10-CM | POA: Diagnosis not present

## 2021-10-13 MED ORDER — SODIUM CHLORIDE 0.9 % IV SOLN
Freq: Once | INTRAVENOUS | Status: AC
  Filled 2021-10-13: qty 250

## 2021-10-13 MED ORDER — IRON SUCROSE 20 MG/ML IV SOLN
200.0000 mg | Freq: Once | INTRAVENOUS | Status: AC
  Administered 2021-10-13: 200 mg via INTRAVENOUS

## 2021-10-13 MED ORDER — SODIUM CHLORIDE 0.9 % IV SOLN: 200.0000 mg | INTRAVENOUS | Status: DC

## 2021-10-15 ENCOUNTER — Inpatient Hospital Stay: Payer: Medicare PPO

## 2021-10-15 VITALS — BP 133/57 | HR 66 | Temp 97.2°F | Resp 18

## 2021-10-15 DIAGNOSIS — D508 Other iron deficiency anemias: Secondary | ICD-10-CM

## 2021-10-15 DIAGNOSIS — D509 Iron deficiency anemia, unspecified: Secondary | ICD-10-CM | POA: Diagnosis not present

## 2021-10-15 MED ORDER — SODIUM CHLORIDE 0.9 % IV SOLN: 200.0000 mg | INTRAVENOUS | Status: DC

## 2021-10-15 MED ORDER — IRON SUCROSE 20 MG/ML IV SOLN
200.0000 mg | Freq: Once | INTRAVENOUS | Status: AC
  Administered 2021-10-15: 200 mg via INTRAVENOUS
  Filled 2021-10-15: qty 10

## 2021-10-15 MED ORDER — SODIUM CHLORIDE 0.9 % IV SOLN
Freq: Once | INTRAVENOUS | Status: AC
  Filled 2021-10-15: qty 250

## 2021-10-15 NOTE — Patient Instructions (Signed)
[facility]  Discharge Instructions: Thank you for choosing [facility] to provide your oncology and hematology care.  If you have a lab appointment with the Cancer Center, please go directly to the Cancer Center and check in at the registration area.  Wear comfortable clothing and clothing appropriate for easy access to any Portacath or PICC line.   We strive to give you quality time with your provider. You may need to reschedule your appointment if you arrive late (15 or more minutes).  Arriving late affects you and other patients whose appointments are after yours.  Also, if you miss three or more appointments without notifying the office, you may be dismissed from the clinic at the provider's discretion.      For prescription refill requests, have your pharmacy contact our office and allow 72 hours for refills to be completed.    Today you received the following chemotherapy and/or immunotherapy agents VENOFER      To help prevent nausea and vomiting after your treatment, we encourage you to take your nausea medication as directed.  BELOW ARE SYMPTOMS THAT SHOULD BE REPORTED IMMEDIATELY: *FEVER GREATER THAN 100.4 F (38 C) OR HIGHER *CHILLS OR SWEATING *NAUSEA AND VOMITING THAT IS NOT CONTROLLED WITH YOUR NAUSEA MEDICATION *UNUSUAL SHORTNESS OF BREATH *UNUSUAL BRUISING OR BLEEDING *URINARY PROBLEMS (pain or burning when urinating, or frequent urination) *BOWEL PROBLEMS (unusual diarrhea, constipation, pain near the anus) TENDERNESS IN MOUTH AND THROAT WITH OR WITHOUT PRESENCE OF ULCERS (sore throat, sores in mouth, or a toothache) UNUSUAL RASH, SWELLING OR PAIN  UNUSUAL VAGINAL DISCHARGE OR ITCHING   Items with * indicate a potential emergency and should be followed up as soon as possible or go to the Emergency Department if any problems should occur.  Please show the CHEMOTHERAPY ALERT CARD or IMMUNOTHERAPY ALERT CARD at check-in to the  Emergency Department and triage nurse.  Should you have questions after your visit or need to cancel or reschedule your appointment, please contact [facility]  [phone] and follow the prompts.  Office hours are 8:00 a.m. to 4:30 p.m. Monday - Friday. Please note that voicemails left after 4:00 p.m. may not be returned until the following business day.  We are closed weekends and major holidays. You have access to a nurse at all times for urgent questions. Please call the main number to the clinic [phone] and follow the prompts.  For any non-urgent questions, you may also contact your provider using MyChart. We now offer e-Visits for anyone 18 and older to request care online for non-urgent symptoms. For details visit [email or web address].   Also download the MyChart app! Go to the app store, search "MyChart", open the app, select [facility], and log in with your MyChart username and password.  Masks are optional in the cancer centers. If you would like for your care team to wear a mask while they are taking care of you, please let them know. For doctor visits, patients may have with them one support person who is at least 80 years old. At this time, visitors are not allowed in the infusion area.   Iron Sucrose Injection What is this medication? IRON SUCROSE (EYE ern SOO krose) treats low levels of iron (iron deficiency anemia) in people with kidney disease. Iron is a mineral that plays an important role in making red blood cells, which carry oxygen from your lungs to the rest of your body. This medicine may   be used for other purposes; ask your health care provider or pharmacist if you have questions. COMMON BRAND NAME(S): Venofer What should I tell my care team before I take this medication? They need to know if you have any of these conditions: Anemia not caused by low iron levels Heart disease High levels of iron in the blood Kidney disease Liver  disease An unusual or allergic reaction to iron, other medications, foods, dyes, or preservatives Pregnant or trying to get pregnant Breast-feeding How should I use this medication? This medication is for infusion into a vein. It is given in a hospital or clinic setting. Talk to your care team about the use of this medication in children. While this medication may be prescribed for children as young as 2 years for selected conditions, precautions do apply. Overdosage: If you think you have taken too much of this medicine contact a poison control center or emergency room at once. NOTE: This medicine is only for you. Do not share this medicine with others. What if I miss a dose? It is important not to miss your dose. Call your care team if you are unable to keep an appointment. What may interact with this medication? Do not take this medication with any of the following: Deferoxamine Dimercaprol Other iron products This medication may also interact with the following: Chloramphenicol Deferasirox This list may not describe all possible interactions. Give your health care provider a list of all the medicines, herbs, non-prescription drugs, or dietary supplements you use. Also tell them if you smoke, drink alcohol, or use illegal drugs. Some items may interact with your medicine. What should I watch for while using this medication? Visit your care team regularly. Tell your care team if your symptoms do not start to get better or if they get worse. You may need blood work done while you are taking this medication. You may need to follow a special diet. Talk to your care team. Foods that contain iron include: whole grains/cereals, dried fruits, beans, or peas, leafy green vegetables, and organ meats (liver, kidney). What side effects may I notice from receiving this medication? Side effects that you should report to your care team as soon as possible: Allergic reactions--skin rash, itching, hives,  swelling of the face, lips, tongue, or throat Low blood pressure--dizziness, feeling faint or lightheaded, blurry vision Shortness of breath Side effects that usually do not require medical attention (report to your care team if they continue or are bothersome): Flushing Headache Joint pain Muscle pain Nausea Pain, redness, or irritation at injection site This list may not describe all possible side effects. Call your doctor for medical advice about side effects. You may report side effects to FDA at 1-800-FDA-1088. Where should I keep my medication? This medication is given in a hospital or clinic and will not be stored at home. NOTE: This sheet is a summary. It may not cover all possible information. If you have questions about this medicine, talk to your doctor, pharmacist, or health care provider.  2023 Elsevier/Gold Standard (2020-08-30 00:00:00)   

## 2021-10-29 ENCOUNTER — Other Ambulatory Visit: Payer: Self-pay | Admitting: Family Medicine

## 2021-10-29 DIAGNOSIS — F5101 Primary insomnia: Secondary | ICD-10-CM

## 2021-10-29 DIAGNOSIS — I10 Essential (primary) hypertension: Secondary | ICD-10-CM

## 2021-10-29 DIAGNOSIS — N3281 Overactive bladder: Secondary | ICD-10-CM

## 2021-10-29 DIAGNOSIS — F5104 Psychophysiologic insomnia: Secondary | ICD-10-CM

## 2021-10-29 NOTE — Telephone Encounter (Signed)
Rx: Losartan and Vesicare 10/03/21 #90 [id number]- too soon Requested Prescriptions  Pending Prescriptions Disp Refills  . losartan (COZAAR) 50 MG tablet [Pharmacy Med Name: LOSARTAN 50 MG TAB] 30 tablet 0    Sig: TAKE ONE TABLET BY MOUTH AT BEDTIME     Cardiovascular:  Angiotensin Receptor Blockers Failed - 10/29/2021  8:31 AM      Failed - Cr in normal range and within 180 days    Creatinine, Ser  Date Value Ref Range Status  06/16/2021 1.22 (H) 0.44 - 1.00 mg/dL Final         Passed - K in normal range and within 180 days    Potassium  Date Value Ref Range Status  06/16/2021 3.7 3.5 - 5.1 mmol/L Final         Passed - Patient is not pregnant      Passed - Last BP in normal range    BP Readings from Last 1 Encounters:  10/15/21 (!) 133/57         Passed - Valid encounter within last 6 months    Recent Outpatient Visits          3 weeks ago Essential (primary) hypertension   [facility] Tally Joe T, FNP   9 months ago Acute deep vein thrombosis (DVT) of proximal vein of left lower extremity [facility])   [facility] [name], FNP   11 months ago [place] Tally Joe T, FNP   1 year ago Acute midline low back pain without sciatica   [facility], Clearnce Sorrel, [place]   1 year ago Functional diarrhea   [place], [place], [place]             . ALPRAZolam (XANAX) 0.25 MG tablet [Pharmacy Med Name: ALPRAZOLAM 0.25 MG TAB] 30 tablet 0    Sig: TAKE ONE TABLET BY MOUTH AT BEDTIME     Not Delegated - Psychiatry: Anxiolytics/Hypnotics 2 Failed - 10/29/2021  8:31 AM      Failed - This refill cannot be delegated      Failed - Urine Drug Screen completed in last 360 days      Passed - Patient is not pregnant      Passed - Valid encounter within last 6 months    Recent Outpatient Visits          3 weeks ago Essential (primary) hypertension   [facility]  Tally Joe T, FNP   9 months ago Acute deep vein thrombosis (DVT) of proximal vein of left lower extremity [facility])   [facility] [name], FNP   11 months ago [place] Tally Joe T, FNP   1 year ago Acute midline low back pain without sciatica   [facility], Clearnce Sorrel, [place]   1 year ago Functional diarrhea   [facility] [place], Anderson Malta M, [place]             . zolpidem (AMBIEN) 10 MG tablet [Pharmacy Med Name: ZOLPIDEM 10 MG TAB] 30 tablet 0    Sig: TAKE ONE TABLET BY MOUTH AT BEDTIME     Not Delegated - Psychiatry:  Anxiolytics/Hypnotics Failed - 10/29/2021  8:31 AM      Failed - This refill cannot be delegated      Failed - Urine Drug Screen completed in last 360 days      Passed - Valid  encounter within last 6 months    Recent Outpatient Visits          3 weeks ago Essential (primary) hypertension   [facility] Tally Joe T, FNP   9 months ago Acute deep vein thrombosis (DVT) of proximal vein of left lower extremity [facility])   [facility] [name], FNP   11 months ago [place] Tally Joe T, FNP   1 year ago Acute midline low back pain without sciatica   [facility], Clearnce Sorrel, [place]   1 year ago Functional diarrhea   [facility] [place], Anderson Malta M, [place]             . solifenacin (VESICARE) 10 MG tablet [Pharmacy Med Name: SOLIFENACIN 10 MG TAB[*]] 30 tablet 0    Sig: TAKE ONE TABLET BY MOUTH ONE TIME DAILY     Urology:  Bladder Agents 2 Failed - 10/29/2021  8:31 AM      Failed - Cr in normal range and within 360 days    Creatinine, Ser  Date Value Ref Range Status  06/16/2021 1.22 (H) 0.44 - 1.00 mg/dL Final         Passed - ALT in normal range and within 360 days    ALT  Date Value Ref Range Status  06/16/2021 23 0 - 44 U/L Final         Passed - AST in normal  range and within 360 days    AST  Date Value Ref Range Status  06/16/2021 23 15 - 41 U/L Final         Passed - Valid encounter within last 12 months    Recent Outpatient Visits          3 weeks ago Essential (primary) hypertension   [facility] Tally Joe T, FNP   9 months ago Acute deep vein thrombosis (DVT) of proximal vein of left lower extremity [facility])   [facility] [name], FNP   11 months ago [place] Tally Joe T, FNP   1 year ago Acute midline low back pain without sciatica   [facility], Clearnce Sorrel, [place]   1 year ago Functional diarrhea   [place], [place], [place]

## 2021-10-29 NOTE — Telephone Encounter (Signed)
Requested medication (s) are due for refill today- no  Requested medication (s) are on the active medication list -yes  Future visit scheduled -yes  Last refill: Alprazolam 10/03/21 #30                  Zolpidem 10/13/21 #30  Notes to clinic: Inadvertently sent - losartan and solifenacin instead of refusing- call to pharmacy- they do have them on file and will cancel the 30 day request. Others- non delegated unable to refuse  Requested Prescriptions  Pending Prescriptions Disp Refills   ALPRAZolam (XANAX) 0.25 MG tablet [Pharmacy Med Name: ALPRAZOLAM 0.25 MG TAB] 30 tablet 0    Sig: TAKE ONE TABLET BY MOUTH AT BEDTIME     Not Delegated - Psychiatry: Anxiolytics/Hypnotics 2 Failed - 10/29/2021  8:31 AM      Failed - This refill cannot be delegated      Failed - Urine Drug Screen completed in last 360 days      Passed - Patient is not pregnant      Passed - Valid encounter within last 6 months    Recent Outpatient Visits           3 weeks ago Essential (primary) hypertension   [facility] Tally Joe T, FNP   9 months ago Acute deep vein thrombosis (DVT) of proximal vein of left lower extremity [facility])   [facility] [name], FNP   11 months ago [place] Tally Joe T, FNP   1 year ago Acute midline low back pain without sciatica   [facility], Clearnce Sorrel, [place]   1 year ago Functional diarrhea   [place], Anderson Malta M, PA-C               zolpidem (AMBIEN) 10 MG tablet [Pharmacy Med Name: ZOLPIDEM 10 MG TAB] 30 tablet 0    Sig: TAKE ONE TABLET BY MOUTH AT BEDTIME     Not Delegated - Psychiatry:  Anxiolytics/Hypnotics Failed - 10/29/2021  8:31 AM      Failed - This refill cannot be delegated      Failed - Urine Drug Screen completed in last 360 days      Passed - Valid encounter within last 6 months    Recent Outpatient Visits           3 weeks ago  Essential (primary) hypertension   [facility] Tally Joe T, FNP   9 months ago Acute deep vein thrombosis (DVT) of proximal vein of left lower extremity [facility])   [facility] [name], FNP   11 months ago [place] Tally Joe T, FNP   1 year ago Acute midline low back pain without sciatica   [facility] [place], Clearnce Sorrel, [place]   1 year ago Functional diarrhea   [place], [place], [place]              Signed Prescriptions Disp Refills   losartan (COZAAR) 50 MG tablet 30 tablet 0    Sig: TAKE ONE TABLET BY MOUTH AT BEDTIME     Cardiovascular:  Angiotensin Receptor Blockers Failed - 10/29/2021  8:31 AM      Failed - Cr in normal range and within 180 days    Creatinine, Ser  Date Value Ref Range Status  06/16/2021 1.22 (H) 0.44 - 1.00 mg/dL Final  Passed - K in normal range and within 180 days    Potassium  Date Value Ref Range Status  06/16/2021 3.7 3.5 - 5.1 mmol/L Final         Passed - Patient is not pregnant      Passed - Last BP in normal range    BP Readings from Last 1 Encounters:  10/15/21 (!) 133/57         Passed - Valid encounter within last 6 months    Recent Outpatient Visits           3 weeks ago Essential (primary) hypertension   [facility] Tally Joe T, FNP   9 months ago Acute deep vein thrombosis (DVT) of proximal vein of left lower extremity [facility])   [facility] [name], FNP   11 months ago [place] Tally Joe T, FNP   1 year ago Acute midline low back pain without sciatica   [facility], Clearn[place]rel, [place]   1 year ago Functional diarrhea   [facility] Fenton Malling M, [place]               solifenacin (VESICARE) 10 MG tablet 30 tablet 0    Sig: TAKE ONE TABLET BY MOUTH ONE TIME DAILY     Urology:  Bladder  Agents 2 Failed - 10/29/2021  8:31 AM      Failed - Cr in normal range and within 360 days    Creatinine, Ser  Date Value Ref Range Status  06/16/2021 1.22 (H) 0.44 - 1.00 mg/dL Final         Passed - ALT in normal range and within 360 days    ALT  Date Value Ref Range Status  06/16/2021 23 0 - 44 U/L Final         Passed - AST in normal range and within 360 days    AST  Date Value Ref Range Status  06/16/2021 23 15 - 41 U/L Final         Passed - Valid encounter within last 12 months    Recent Outpatient Visits           3 weeks ago Essential (primary) hypertension   [facility] Tally Joe T, FNP   9 months ago Acute deep vein thrombosis (DVT) of proximal vein of left lower extremity [facility])   [facility] [name], FNP   11 months ago [place] Tally Joe T, FNP   1 year ago Acute midline low back pain without sciatica   [facility], Clearnce Sorrel, [place]   1 year ago Functional diarrhea   [place], Anderson Malta M, [place]                 Requested Prescriptions  Pending Prescriptions Disp Refills   ALPRAZolam (XANAX) 0.25 MG tablet [Pharmacy Med Name: ALPRAZOLAM 0.25 MG TAB] 30 tablet 0    Sig: TAKE ONE TABLET BY MOUTH AT BEDTIME     Not Delegated - Psychiatry: Anxiolytics/Hypnotics 2 Failed - 10/29/2021  8:31 AM      Failed - This refill cannot be delegated      Failed - Urine Drug Screen completed in last 360 days      Passed - Patient is not pregnant      Passed - Valid encounter within last 6 months    Recent Outpatient Visits  3 weeks ago Essential (primary) hypertension   [facility] Tally Joe T, FNP   9 months ago Acute deep vein thrombosis (DVT) of proximal vein of left lower extremity [facility])   [facility] [name], FNP   11 months ago [place] Tally Joe T, FNP    1 year ago Acute midline low back pain without sciatica   [facility], Clearnce Sorrel, [place]   1 year ago Functional diarrhea   [facility] Fenton Malling M, PA-C               zolpidem (AMBIEN) 10 MG tablet [Pharmacy Med Name: ZOLPIDEM 10 MG TAB] 30 tablet 0    Sig: TAKE ONE TABLET BY MOUTH AT BEDTIME     Not Delegated - Psychiatry:  Anxiolytics/Hypnotics Failed - 10/29/2021  8:31 AM      Failed - This refill cannot be delegated      Failed - Urine Drug Screen completed in last 360 days      Passed - Valid encounter within last 6 months    Recent Outpatient Visits           3 weeks ago Essential (primary) hypertension   [facility] Tally Joe T, FNP   9 months ago Acute deep vein thrombosis (DVT) of proximal vein of left lower extremity [facility])   [facility] [name], FNP   11 months ago [place] Tally Joe T, FNP   1 year ago Acute midline low back pain without sciatica   [facility], Clearnce Sorrel, [place]   1 year ago Functional diarrhea   [place], [place], [place]              Signed Prescriptions Disp Refills   losartan (COZAAR) 50 MG tablet 30 tablet 0    Sig: TAKE ONE TABLET BY MOUTH AT BEDTIME     Cardiovascular:  Angiotensin Receptor Blockers Failed - 10/29/2021  8:31 AM      Failed - Cr in normal range and within 180 days    Creatinine, Ser  Date Value Ref Range Status  06/16/2021 1.22 (H) 0.44 - 1.00 mg/dL Final         Passed - K in normal range and within 180 days    Potassium  Date Value Ref Range Status  06/16/2021 3.7 3.5 - 5.1 mmol/L Final         Passed - Patient is not pregnant      Passed - Last BP in normal range    BP Readings from Last 1 Encounters:  10/15/21 (!) 133/57         Passed - Valid encounter within last 6 months    Recent Outpatient Visits           3 weeks ago  Essential (primary) hypertension   [facility] Tally Joe T, FNP   9 months ago Acute deep vein thrombosis (DVT) of proximal vein of left lower extremity [facility])   [facility] [name], FNP   11 months ago [place] Tally Joe T, FNP   1 year ago Acute midline low back pain without sciatica   [facility], Clearnce Sorrel, [place]   1 year ago Functional diarrhea   [place], [place], [place]  solifenacin (VESICARE) 10 MG tablet 30 tablet 0    Sig: TAKE ONE TABLET BY MOUTH ONE TIME DAILY     Urology:  Bladder Agents 2 Failed - 10/29/2021  8:31 AM      Failed - Cr in normal range and within 360 days    Creatinine, Ser  Date Value Ref Range Status  06/16/2021 1.22 (H) 0.44 - 1.00 mg/dL Final         Passed - ALT in normal range and within 360 days    ALT  Date Value Ref Range Status  06/16/2021 23 0 - 44 U/L Final         Passed - AST in normal range and within 360 days    AST  Date Value Ref Range Status  06/16/2021 23 15 - 41 U/L Final         Passed - Valid encounter within last 12 months    Recent Outpatient Visits           3 weeks ago Essential (primary) hypertension   [facility] Tally Joe T, FNP   9 months ago Acute deep vein thrombosis (DVT) of proximal vein of left lower extremity [facility])   [facility] [name], FNP   11 months ago [place] Tally Joe T, FNP   1 year ago Acute midline low back pain without sciatica   [facility], Clearnce Sorrel, [place]   1 year ago Functional diarrhea   [place], [place], [place]

## 2021-10-31 ENCOUNTER — Other Ambulatory Visit: Payer: Self-pay | Admitting: Family Medicine

## 2021-10-31 DIAGNOSIS — I10 Essential (primary) hypertension: Secondary | ICD-10-CM

## 2021-10-31 DIAGNOSIS — N3281 Overactive bladder: Secondary | ICD-10-CM

## 2021-10-31 DIAGNOSIS — E034 Atrophy of thyroid (acquired): Secondary | ICD-10-CM

## 2021-10-31 NOTE — Telephone Encounter (Signed)
Requested Prescriptions  Pending Prescriptions Disp Refills  . verapamil (VERELAN PM) 120 MG 24 hr capsule [Pharmacy Med Name: VERAPAMIL ER 120 MG CAP] 90 capsule 0    Sig: TAKE ONE CAPSULE BY MOUTH ONE TIME DAILY     Cardiovascular: Calcium Channel Blockers 3 Failed - 10/31/2021 12:10 AM      Failed - Cr in normal range and within 360 days    Creatinine, Ser  Date Value Ref Range Status  06/16/2021 1.22 (H) 0.44 - 1.00 mg/dL Final         Passed - ALT in normal range and within 360 days    ALT  Date Value Ref Range Status  06/16/2021 23 0 - 44 U/L Final         Passed - AST in normal range and within 360 days    AST  Date Value Ref Range Status  06/16/2021 23 15 - 41 U/L Final         Passed - Last BP in normal range    BP Readings from Last 1 Encounters:  10/15/21 (!) 133/57         Passed - Last Heart Rate in normal range    Pulse Readings from Last 1 Encounters:  10/15/21 66         Passed - Valid encounter within last 6 months    Recent Outpatient Visits          4 weeks ago Essential (primary) hypertension   [facility] Tally Joe T, FNP   9 months ago Acute deep vein thrombosis (DVT) of proximal vein of left lower extremity [facility])   [facility] [name], FNP   11 months ago [place] Tally Joe T, FNP   1 year ago Acute midline low back pain without sciatica   [facility], Clearnce Sorrel, [place]   1 year ago Functional diarrhea   [facility] [place], [place], [place]

## 2021-11-03 ENCOUNTER — Inpatient Hospital Stay: Payer: Medicare PPO | Attending: Oncology

## 2021-11-03 ENCOUNTER — Other Ambulatory Visit: Payer: Self-pay

## 2021-11-03 DIAGNOSIS — D509 Iron deficiency anemia, unspecified: Secondary | ICD-10-CM | POA: Insufficient documentation

## 2021-11-03 DIAGNOSIS — D508 Other iron deficiency anemias: Secondary | ICD-10-CM

## 2021-11-03 LAB — CBC WITH DIFFERENTIAL/PLATELET
Abs Immature Granulocytes: 0.05 10*3/uL (ref 0.00–0.07)
Basophils Absolute: 0.1 10*3/uL (ref 0.0–0.1)
Basophils Relative: 1 %
Eosinophils Absolute: 0.6 10*3/uL — ABNORMAL HIGH (ref 0.0–0.5)
Eosinophils Relative: 5 %
HCT: 42.4 % (ref 36.0–46.0)
Hemoglobin: 14.1 g/dL (ref 12.0–15.0)
Immature Granulocytes: 0 %
Lymphocytes Relative: 54 %
Lymphs Abs: 7.4 10*3/uL — ABNORMAL HIGH (ref 0.7–4.0)
MCH: 29.9 pg (ref 26.0–34.0)
MCHC: 33.3 g/dL (ref 30.0–36.0)
MCV: 89.8 fL (ref 80.0–100.0)
Monocytes Absolute: 1.4 10*3/uL — ABNORMAL HIGH (ref 0.1–1.0)
Monocytes Relative: 11 %
Neutro Abs: 3.9 10*3/uL (ref 1.7–7.7)
Neutrophils Relative %: 29 %
Platelets: 235 10*3/uL (ref 150–400)
RBC: 4.72 MIL/uL (ref 3.87–5.11)
RDW: 15.6 % — ABNORMAL HIGH (ref 11.5–15.5)
WBC: 13.5 10*3/uL — ABNORMAL HIGH (ref 4.0–10.5)
nRBC: 0 % (ref 0.0–0.2)

## 2021-11-03 LAB — IRON AND TIBC
Iron: 103 ug/dL (ref 28–170)
Saturation Ratios: 30 % (ref 10.4–31.8)
TIBC: 340 ug/dL (ref 250–450)
UIBC: 237 ug/dL

## 2021-11-03 LAB — FERRITIN: Ferritin: 320 ng/mL — ABNORMAL HIGH (ref 11–307)

## 2021-11-12 ENCOUNTER — Encounter: Payer: Self-pay | Admitting: Oncology

## 2021-11-13 ENCOUNTER — Other Ambulatory Visit: Payer: Self-pay | Admitting: Family Medicine

## 2021-11-13 DIAGNOSIS — G588 Other specified mononeuropathies: Secondary | ICD-10-CM

## 2021-11-13 NOTE — Telephone Encounter (Signed)
Medication Refill - Medication: gabapentin (NEURONTIN) 600 MG tablet  Has the patient contacted their pharmacy? Yes.   Pt was told to contact PCP.    (Agent: If yes, when and what did the pharmacy advise?)  Preferred Pharmacy (with phone number or street name):  [place] [place] [facility] AT [address]  [place] [address]  Phone: [phone] Fax: [phone]  Hours: Not open 24 hours   Has the patient been seen for an appointment in the last year OR does the patient have an upcoming appointment? Yes.    Agent: Please be advised that RX refills may take up to 3 business days. We ask that you follow-up with your pharmacy.

## 2021-11-14 MED ORDER — GABAPENTIN 600 MG PO TABS: 600.0000 mg | ORAL_TABLET | Freq: Two times a day (BID) | ORAL | 0 refills | Status: DC

## 2021-11-14 NOTE — Telephone Encounter (Signed)
Requested Prescriptions  Pending Prescriptions Disp Refills  . gabapentin (NEURONTIN) 600 MG tablet 60 tablet 0    Sig: Take 1 tablet (600 mg total) by mouth 2 (two) times daily.     Neurology: Anticonvulsants - gabapentin Failed - 11/13/2021 11:16 AM      Failed - Cr in normal range and within 360 days    Creatinine, Ser  Date Value Ref Range Status  06/16/2021 1.22 (H) 0.44 - 1.00 mg/dL Final         Passed - Completed PHQ-2 or PHQ-9 in the last 360 days      Passed - Valid encounter within last 12 months    Recent Outpatient Visits          1 month ago Essential (primary) hypertension   [facility] Tally Joe T, FNP   9 months ago Acute deep vein thrombosis (DVT) of proximal vein of left lower extremity [facility])   [facility] [name], FNP   12 months ago [place] Tally Joe T, FNP   1 year ago Acute midline low back pain without sciatica   [facility], Clearnce Sorrel, [place]   2 years ago Functional diarrhea   [facility] [place], [place], [place]

## 2021-11-28 ENCOUNTER — Other Ambulatory Visit: Payer: Self-pay | Admitting: Family Medicine

## 2021-11-28 DIAGNOSIS — F5104 Psychophysiologic insomnia: Secondary | ICD-10-CM

## 2021-11-28 NOTE — Telephone Encounter (Signed)
Requested medication (s) are due for refill today:   Provider to review  Requested medication (s) are on the active medication list:   Yes  Future visit scheduled:   No   Last ordered: 10/29/2021 #30, 0 refills  Non delegated refill   Requested Prescriptions  Pending Prescriptions Disp Refills   ALPRAZolam (XANAX) 0.25 MG tablet [Pharmacy Med Name: ALPRAZOLAM 0.25 MG TAB] 30 tablet 0    Sig: TAKE ONE TABLET BY MOUTH AT BEDTIME     Not Delegated - Psychiatry: Anxiolytics/Hypnotics 2 Failed - 11/28/2021  2:21 AM      Failed - This refill cannot be delegated      Failed - Urine Drug Screen completed in last 360 days      Passed - Patient is not pregnant      Passed - Valid encounter within last 6 months    Recent Outpatient Visits           1 month ago Essential (primary) hypertension   [facility] Tally Joe T, FNP   10 months ago Acute deep vein thrombosis (DVT) of proximal vein of left lower extremity [facility])   [facility] [name], FNP   1 year ago [place] Tally Joe T, FNP   1 year ago Acute midline low back pain without sciatica   [facility], Clearnce Sorrel, [place]   2 years ago Functional diarrhea   [facility] [place], [place], [place]

## 2021-12-01 ENCOUNTER — Other Ambulatory Visit: Payer: Self-pay | Admitting: Family Medicine

## 2021-12-01 DIAGNOSIS — I10 Essential (primary) hypertension: Secondary | ICD-10-CM

## 2021-12-09 ENCOUNTER — Other Ambulatory Visit: Payer: Self-pay | Admitting: Family Medicine

## 2021-12-09 DIAGNOSIS — F5101 Primary insomnia: Secondary | ICD-10-CM

## 2021-12-09 NOTE — Telephone Encounter (Signed)
Requested medication (s) are due for refill today - yes  Requested medication (s) are on the active medication list -yes  Future visit scheduled -no  Last refill: 10/29/21 #30  Notes to clinic: non delegated Rx  Requested Prescriptions  Pending Prescriptions Disp Refills   zolpidem (AMBIEN) 10 MG tablet [Pharmacy Med Name: ZOLPIDEM 10 MG TAB] 30 tablet 0    Sig: TAKE ONE TABLET BY MOUTH AT BEDTIME     Not Delegated - Psychiatry:  Anxiolytics/Hypnotics Failed - 12/09/2021  2:29 PM      Failed - This refill cannot be delegated      Failed - Urine Drug Screen completed in last 360 days      Passed - Valid encounter within last 6 months    Recent Outpatient Visits           2 months ago Essential (primary) hypertension   [facility] Tally Joe T, FNP   10 months ago Acute deep vein thrombosis (DVT) of proximal vein of left lower extremity [facility])   [facility] [name], FNP   1 year ago [place] Tally Joe T, FNP   1 year ago Acute midline low back pain without sciatica   [facility], Clearnce Sorrel, [place]   2 years ago Functional diarrhea   [place], [place]                 Requested Prescriptions  Pending Prescriptions Disp Refills   zolpidem (AMBIEN) 10 MG tablet [Pharmacy Med Name: ZOLPIDEM 10 MG TAB] 30 tablet 0    Sig: TAKE ONE TABLET BY MOUTH AT BEDTIME     Not Delegated - Psychiatry:  Anxiolytics/Hypnotics Failed - 12/09/2021  2:29 PM      Failed - This refill cannot be delegated      Failed - Urine Drug Screen completed in last 360 days      Passed - Valid encounter within last 6 months    Recent Outpatient Visits           2 months ago Essential (primary) hypertension   [facility] Tally Joe T, FNP   10 months ago Acute deep vein thrombosis (DVT) of proximal vein of left lower extremity [facility])   [facility] [name], FNP   1 year ago [place] Tally Joe T, FNP   1 year ago Acute midline low back pain without sciatica   [facility], Clearnce Sorrel, [place]   2 years ago Functional diarrhea   [facility] [place], [place], [place]

## 2021-12-10 ENCOUNTER — Other Ambulatory Visit: Payer: Self-pay | Admitting: Family Medicine

## 2021-12-10 DIAGNOSIS — G588 Other specified mononeuropathies: Secondary | ICD-10-CM

## 2021-12-15 ENCOUNTER — Ambulatory Visit: Payer: Medicare PPO

## 2021-12-25 ENCOUNTER — Other Ambulatory Visit: Payer: Self-pay | Admitting: Family Medicine

## 2021-12-25 DIAGNOSIS — I10 Essential (primary) hypertension: Secondary | ICD-10-CM

## 2021-12-26 ENCOUNTER — Other Ambulatory Visit: Payer: Self-pay | Admitting: Family Medicine

## 2021-12-26 DIAGNOSIS — M545 Low back pain, unspecified: Secondary | ICD-10-CM

## 2021-12-26 DIAGNOSIS — G8929 Other chronic pain: Secondary | ICD-10-CM

## 2021-12-26 DIAGNOSIS — R1011 Right upper quadrant pain: Secondary | ICD-10-CM

## 2021-12-26 NOTE — Telephone Encounter (Signed)
Medication Refill - Medication: HYDROcodone-acetaminophen (NORCO/VICODIN) 5-325 MG tablet [[id number]]    Has the patient contacted their pharmacy? Yes.   (Agent: If no, request that the patient contact the pharmacy for the refill. If patient does not wish to contact the pharmacy document the reason why and proceed with request.) (Agent: If yes, when and what did the pharmacy advise?)  Preferred Pharmacy (with phone number or street name):  [place], [place] [facility] AT [address]  [place] [address]  Phone: [phone] Fax: [phone]  Hours: Not open 24 hours   Has the patient been seen for an appointment in the last year OR does the patient have an upcoming appointment? Yes.  10/03/21  Agent: Please be advised that RX refills may take up to 3 business days. We ask that you follow-up with your pharmacy.

## 2021-12-26 NOTE — Telephone Encounter (Signed)
Requested medication (s) are due for refill today - provider review  Requested medication (s) are on the active medication list -yes  Future visit scheduled -no  Last refill: 07/25/20 #90  Notes to clinic: non delegated Rx  Requested Prescriptions  Pending Prescriptions Disp Refills   HYDROcodone-acetaminophen (NORCO/VICODIN) 5-325 MG tablet 90 tablet 0    Sig: Take 1 tablet by mouth every 6 (six) hours as needed for moderate pain.     Not Delegated - Analgesics:  Opioid Agonist Combinations Failed - 12/26/2021 11:07 AM      Failed - This refill cannot be delegated      Failed - Urine Drug Screen completed in last 360 days      Passed - Valid encounter within last 3 months    Recent Outpatient Visits           2 months ago Essential (primary) hypertension   [facility] Tally Joe T, FNP   11 months ago Acute deep vein thrombosis (DVT) of proximal vein of left lower extremity [facility])   [facility] [name], FNP   1 year ago [place] Tally Joe T, FNP   1 year ago Acute midline low back pain without sciatica   [facility], Clearnce Sorrel, [place]   2 years ago Functional diarrhea   [place], Anderson Malta M, [place]                 Requested Prescriptions  Pending Prescriptions Disp Refills   HYDROcodone-acetaminophen (NORCO/VICODIN) 5-325 MG tablet 90 tablet 0    Sig: Take 1 tablet by mouth every 6 (six) hours as needed for moderate pain.     Not Delegated - Analgesics:  Opioid Agonist Combinations Failed - 12/26/2021 11:07 AM      Failed - This refill cannot be delegated      Failed - Urine Drug Screen completed in last 360 days      Passed - Valid encounter within last 3 months    Recent Outpatient Visits           2 months ago Essential (primary) hypertension   [facility] Tally Joe T, FNP   11 months ago Acute deep vein thrombosis (DVT) of  proximal vein of left lower extremity [facility])   [facility] [name], FNP   1 year ago [place] Tally Joe T, FNP   1 year ago Acute midline low back pain without sciatica   [facility], Clearnce Sorrel, [place]   2 years ago Functional diarrhea   [facility], [place], [place]

## 2021-12-28 ENCOUNTER — Other Ambulatory Visit: Payer: Self-pay | Admitting: Family Medicine

## 2021-12-28 DIAGNOSIS — E7800 Pure hypercholesterolemia, unspecified: Secondary | ICD-10-CM

## 2022-01-05 ENCOUNTER — Encounter: Payer: Self-pay | Admitting: Family Medicine

## 2022-01-05 ENCOUNTER — Ambulatory Visit: Payer: Medicare PPO | Admitting: Family Medicine

## 2022-01-05 VITALS — BP 171/67 | HR 72 | Resp 16 | Ht 65.0 in | Wt 202.0 lb

## 2022-01-05 DIAGNOSIS — R1011 Right upper quadrant pain: Secondary | ICD-10-CM | POA: Insufficient documentation

## 2022-01-05 DIAGNOSIS — E7800 Pure hypercholesterolemia, unspecified: Secondary | ICD-10-CM

## 2022-01-05 DIAGNOSIS — R7303 Prediabetes: Secondary | ICD-10-CM | POA: Diagnosis not present

## 2022-01-05 DIAGNOSIS — L309 Dermatitis, unspecified: Secondary | ICD-10-CM | POA: Diagnosis not present

## 2022-01-05 DIAGNOSIS — F5104 Psychophysiologic insomnia: Secondary | ICD-10-CM

## 2022-01-05 DIAGNOSIS — M546 Pain in thoracic spine: Secondary | ICD-10-CM

## 2022-01-05 DIAGNOSIS — Z23 Encounter for immunization: Secondary | ICD-10-CM

## 2022-01-05 DIAGNOSIS — M545 Low back pain, unspecified: Secondary | ICD-10-CM | POA: Diagnosis not present

## 2022-01-05 DIAGNOSIS — I10 Essential (primary) hypertension: Secondary | ICD-10-CM | POA: Diagnosis not present

## 2022-01-05 DIAGNOSIS — D508 Other iron deficiency anemias: Secondary | ICD-10-CM | POA: Diagnosis not present

## 2022-01-05 DIAGNOSIS — E034 Atrophy of thyroid (acquired): Secondary | ICD-10-CM

## 2022-01-05 DIAGNOSIS — G8929 Other chronic pain: Secondary | ICD-10-CM

## 2022-01-05 MED ORDER — HYDROCODONE-ACETAMINOPHEN 5-325 MG PO TABS: 1.0000 | ORAL_TABLET | Freq: Every day | ORAL | 0 refills | Status: DC | PRN

## 2022-01-05 MED ORDER — ZOLPIDEM TARTRATE 10 MG PO TABS: 10.0000 mg | ORAL_TABLET | Freq: Every day | ORAL | 5 refills | Status: DC

## 2022-01-05 MED ORDER — TRIAMCINOLONE ACETONIDE 0.5 % EX OINT: 1.0000 | TOPICAL_OINTMENT | Freq: Two times a day (BID) | CUTANEOUS | 0 refills | Status: DC

## 2022-01-05 MED ORDER — COLESTIPOL HCL 1 G PO TABS: 1.0000 g | ORAL_TABLET | Freq: Two times a day (BID) | ORAL | 5 refills | Status: DC

## 2022-01-05 MED ORDER — ALPRAZOLAM 0.25 MG PO TABS: 0.2500 mg | ORAL_TABLET | Freq: Every day | ORAL | 5 refills | Status: DC

## 2022-01-05 NOTE — Assessment & Plan Note (Signed)
Chronic, uncontrolled Will reach out to [place] to see what he recommends for next steps Was changed from atenolol to coreg 12.5 BID Continue cozaar 50; and verapamil 120 mg at this time

## 2022-01-05 NOTE — Assessment & Plan Note (Signed)
Chronic, previously on 112 mcg Repeat TSH/Free t4

## 2022-01-05 NOTE — Assessment & Plan Note (Signed)
Chronic, stable LDL at 100 with zetia 10 mg and diet modifications

## 2022-01-05 NOTE — Assessment & Plan Note (Signed)
Chronic, stable Associated with CLL Request for additional labs to assist with given fatigue

## 2022-01-05 NOTE — Assessment & Plan Note (Signed)
Consented; VIS made available; no immediate side effects following administration; plan to repeat annually   

## 2022-01-05 NOTE — Assessment & Plan Note (Signed)
Acute, stable R ear; not improved with OTC anti-itch cream Trial of kenalog for 2 weeks with plan to see derm if necessary

## 2022-01-05 NOTE — Assessment & Plan Note (Signed)
Chronic, worsening Request refill of norco to assist 5-325; was able to keep last 90# for 17 months Referral to sports med and pain clinic to assist Continue to recommend use of APAP for assistance

## 2022-01-05 NOTE — Progress Notes (Signed)
Established patient visit   [name]   DOB: [birth year]   81 y.o. Female  MRN: [id number] Visit Date: 01/05/2022  Today's healthcare provider: [name], FNP   I,Tiffany J Bragg,acting as a scribe for [name], FNP.,have documented all relevant documentation on the behalf of [name], FNP,as directed by  [name], FNP while in the presence of [name], FNP.   Chief Complaint  Patient presents with   Hypertension   Subjective    HPI  Hypertension, follow-up  BP Readings from Last 3 Encounters:  01/05/22 (!) 171/67  10/15/21 (!) 133/57  10/13/21 136/64   Wt Readings from Last 3 Encounters:  01/05/22 202 lb (91.6 kg)  10/03/21 201 lb 6.4 oz (91.4 kg)  09/10/21 201 lb (91.2 kg)     She was last seen for hypertension 3 months ago.  BP at that visit was 139/60. Management since that visit includes continue Coreg 12.5 BID, Losartan 50 mg qHS, Verapamil 120 mg 1/day  She reports excellent compliance with treatment. She is not having side effects.  She is following a Regular diet. She is not exercising. She does not smoke.  Use of agents associated with hypertension: none.   Outside blood pressures are around 160/80's. Symptoms: No chest pain No chest pressure  No palpitations No syncope  No dyspnea No orthopnea  No paroxysmal nocturnal dyspnea No lower extremity edema   Pertinent labs Lab Results  Component Value Date   CHOL 183 07/01/2020   HDL 48 07/01/2020   LDLCALC 100 (H) 07/01/2020   TRIG 173 (H) 07/01/2020   CHOLHDL 3.8 07/01/2020   Lab Results  Component Value Date   NA 136 06/16/2021   K 3.7 06/16/2021   CREATININE 1.22 (H) 06/16/2021   GFRNONAA 45 (L) 06/16/2021   GLUCOSE 98 06/16/2021   TSH 0.924 08/04/2021     The 10-year ASCVD risk score (Arnett DK, et al., 2019) is: 48.1%  ---------------------------------------------------------------------------------------------------    Medications: Outpatient Medications Prior to Visit  Medication Sig   carvedilol (COREG) 12.5 MG tablet Take 1 tablet (12.5 mg total) by mouth 2 (two) times daily with a meal.   Cholecalciferol (VITAMIN D3) 1.25 MG (50000 UT) CAPS Take 1 capsule by mouth once a week.   cyclobenzaprine (FLEXERIL) 10 MG tablet Take 1 tablet (10 mg total) by mouth at bedtime as needed for muscle spasms.   donepezil (ARICEPT) 10 MG tablet Take 1 tablet (10 mg total) by mouth at bedtime.   ELIQUIS 5 MG TABS tablet Take 5 mg by mouth 2 (two) times daily.   ezetimibe (ZETIA) 10 MG tablet TAKE ONE TABLET BY MOUTH AT BEDTIME   gabapentin (NEURONTIN) 600 MG tablet TAKE ONE TABLET BY MOUTH TWICE A DAY   levothyroxine (SYNTHROID) 112 MCG tablet TAKE ONE TABLET BY MOUTH ONE TIME DAILY BEFORE BREAKFAST   losartan (COZAAR) 50 MG tablet TAKE ONE TABLET BY MOUTH AT BEDTIME   Magnesium 250 MG TABS Take 1 tablet by mouth daily.   pantoprazole (PROTONIX) 40 MG tablet Take 1 tablet (40 mg total) by mouth in the morning.   potassium chloride SA (K-DUR) 20 MEQ tablet TAKE 1 TABLET BY MOUTH TWICE DAILY   solifenacin (VESICARE) 10 MG tablet TAKE ONE TABLET BY MOUTH ONE TIME DAILY   triamterene-hydrochlorothiazide (MAXZIDE) 75-50 MG tablet Take 0.5 tablets by mouth daily.   verapamil (VERELAN PM) 120 MG 24 hr capsule TAKE ONE CAPSULE  BY MOUTH ONE TIME DAILY   [DISCONTINUED] ALPRAZolam (XANAX) 0.25 MG tablet TAKE ONE TABLET BY MOUTH AT BEDTIME   [DISCONTINUED] HYDROcodone-acetaminophen (NORCO/VICODIN) 5-325 MG tablet Take 1 tablet by mouth every 6 (six) hours as needed for moderate pain.   [DISCONTINUED] zolpidem (AMBIEN) 10 MG tablet TAKE ONE TABLET BY MOUTH AT BEDTIME   No facility-administered medications prior to visit.    Review of Systems    Objective    BP (!) 171/67 (BP Location: Left Arm, Patient Position: Sitting, Cuff Size: Normal)   Pulse 72   Resp 16   Ht '5\' 5"'$  (1.651 m)   Wt 202 lb (91.6 kg)   SpO2 98%   BMI  33.61 kg/m   Physical Exam Vitals and nursing note reviewed.  Constitutional:      General: She is not in acute distress.    Appearance: Normal appearance. She is obese. She is not ill-appearing, toxic-appearing or diaphoretic.  HENT:     Head: Normocephalic and atraumatic.  Cardiovascular:     Rate and Rhythm: Normal rate and regular rhythm.     Pulses: Normal pulses.     Heart sounds: Normal heart sounds. No murmur heard.    No friction rub. No gallop.  Pulmonary:     Effort: Pulmonary effort is normal. No respiratory distress.     Breath sounds: Normal breath sounds. No stridor. No wheezing, rhonchi or rales.  Chest:     Chest wall: No tenderness.  Musculoskeletal:        General: No swelling, tenderness, deformity or signs of injury. Normal range of motion.     Right lower leg: No edema.     Left lower leg: No edema.  Skin:    General: Skin is warm and dry.     Capillary Refill: Capillary refill takes less than 2 seconds.     Coloration: Skin is not jaundiced or pale.     Findings: No bruising, erythema, lesion or rash.  Neurological:     General: No focal deficit present.     Mental Status: She is alert and oriented to person, place, and time. Mental status is at baseline.     Cranial Nerves: No cranial nerve deficit.     Sensory: No sensory deficit.     Motor: No weakness.     Coordination: Coordination normal.  Psychiatric:        Mood and Affect: Mood normal.        Behavior: Behavior normal.        Thought Content: Thought content normal.        Judgment: Judgment normal.      No results found for any visits on 01/05/22.  Assessment & Plan     Problem List Items Addressed This Visit       Cardiovascular and Mediastinum   Essential (primary) hypertension    Chronic, uncontrolled Will reach out to [place] to see what he recommends for next steps Was changed from atenolol to coreg 12.5 BID Continue cozaar 50; and verapamil 120 mg at this time        Relevant Medications   ELIQUIS 5 MG TABS tablet   colestipol (COLESTID) 1 g tablet   Other Relevant Orders   Comprehensive metabolic panel   Lipid panel   CBC with Differential/Platelet     Endocrine   Hypothyroidism due to acquired atrophy of thyroid    Chronic, previously on 112 mcg Repeat TSH/Free t4      Relevant Orders  TSH + free T4     Musculoskeletal and Integument   Dermatitis    Acute, stable R ear; not improved with OTC anti-itch cream Trial of kenalog for 2 weeks with plan to see derm if necessary       Relevant Medications   triamcinolone ointment (KENALOG) 0.5 %     Other   Acute midline low back pain without sciatica - Primary    Chronic, worsening Request refill of norco to assist 5-325; was able to keep last 90# for 17 months Referral to sports med and pain clinic to assist Continue to recommend use of APAP for assistance       Relevant Medications   HYDROcodone-acetaminophen (NORCO/VICODIN) 5-325 MG tablet   RESOLVED: Chronic right-sided thoracic back pain   Relevant Medications   HYDROcodone-acetaminophen (NORCO/VICODIN) 5-325 MG tablet   Hypercholesterolemia    Chronic, stable LDL at 100 with zetia 10 mg and diet modifications       Relevant Medications   ELIQUIS 5 MG TABS tablet   colestipol (COLESTID) 1 g tablet   Other Relevant Orders   Lipid panel   Iron deficiency anemia    Chronic, stable Associated with CLL Request for additional labs to assist with given fatigue       Relevant Orders   CBC with Differential/Platelet   Need for influenza vaccination    Consented; VIS made available; no immediate side effects following administration; plan to repeat annually        Relevant Orders   Flu Vaccine QUAD High Dose(Fluad) (Completed)   Prediabetes    Chronic, stable Repeat A1c Continue to recommend balanced, lower carb meals. Smaller meal size, adding snacks. Choosing water as drink of choice and increasing purposeful  exercise.       Relevant Orders   Hemoglobin A1c   Psychophysiologic insomnia    Chronic, stable Continue ambien 10 mg and xanaz 0.25 mg Pt is aware of risks of psychoactive medication use to include increased sedation, respiratory suppression, falls, extrapyramidal movements,  dependence and cardiovascular events.  Pt would like to continue treatment as benefit determined to outweigh risk.         Relevant Medications   zolpidem (AMBIEN) 10 MG tablet   ALPRAZolam (XANAX) 0.25 MG tablet     Return in about 6 months (around 07/06/2022).      Vonna Kotyk, FNP, have reviewed all documentation for this visit. The documentation on 01/05/22 for the exam, diagnosis, procedures, and orders are all accurate and complete.    [name], [place] [phone] (phone) [phone] (fax)  [place]

## 2022-01-05 NOTE — Assessment & Plan Note (Signed)
Chronic, stable Repeat A1c Continue to recommend balanced, lower carb meals. Smaller meal size, adding snacks. Choosing water as drink of choice and increasing purposeful exercise.  

## 2022-01-05 NOTE — Assessment & Plan Note (Signed)
Chronic, stable Continue ambien 10 mg and xanaz 0.25 mg Pt is aware of risks of psychoactive medication use to include increased sedation, respiratory suppression, falls, extrapyramidal movements,  dependence and cardiovascular events.  Pt would like to continue treatment as benefit determined to outweigh risk.

## 2022-01-06 LAB — LIPID PANEL
Chol/HDL Ratio: 4.1 ratio (ref 0.0–4.4)
Cholesterol, Total: 180 mg/dL (ref 100–199)
HDL: 44 mg/dL (ref >39)
LDL Chol Calc (NIH): 103 mg/dL — ABNORMAL HIGH (ref 0–99)
Triglycerides: 188 mg/dL — ABNORMAL HIGH (ref 0–149)
VLDL Cholesterol Cal: 33 mg/dL (ref 5–40)

## 2022-01-06 LAB — CBC WITH DIFFERENTIAL/PLATELET
Basophils Absolute: 0.1 10*3/uL (ref 0.0–0.2)
Basos: 1 %
EOS (ABSOLUTE): 0.6 10*3/uL — ABNORMAL HIGH (ref 0.0–0.4)
Eos: 4 %
Hematocrit: 44.3 % (ref 34.0–46.6)
Hemoglobin: 15.1 g/dL (ref 11.1–15.9)
Immature Grans (Abs): 0 10*3/uL (ref 0.0–0.1)
Immature Granulocytes: 0 %
Lymphocytes Absolute: 9.1 10*3/uL — ABNORMAL HIGH (ref 0.7–3.1)
Lymphs: 62 %
MCH: 30.4 pg (ref 26.6–33.0)
MCHC: 34.1 g/dL (ref 31.5–35.7)
MCV: 89 fL (ref 79–97)
Monocytes Absolute: 0.9 10*3/uL (ref 0.1–0.9)
Monocytes: 6 %
Neutrophils Absolute: 4 10*3/uL (ref 1.4–7.0)
Neutrophils: 27 %
Platelets: 260 10*3/uL (ref 150–450)
RBC: 4.96 x10E6/uL (ref 3.77–5.28)
RDW: 14 % (ref 11.7–15.4)
WBC: 14.7 10*3/uL — ABNORMAL HIGH (ref 3.4–10.8)

## 2022-01-06 LAB — COMPREHENSIVE METABOLIC PANEL
ALT: 37 IU/L — ABNORMAL HIGH (ref 0–32)
AST: 29 IU/L (ref 0–40)
Albumin/Globulin Ratio: 2.2 (ref 1.2–2.2)
Albumin: 4.4 g/dL (ref 3.8–4.8)
Alkaline Phosphatase: 91 IU/L (ref 44–121)
BUN/Creatinine Ratio: 17 (ref 12–28)
BUN: 18 mg/dL (ref 8–27)
Bilirubin Total: 0.3 mg/dL (ref 0.0–1.2)
CO2: 26 mmol/L (ref 20–29)
Calcium: 9.8 mg/dL (ref 8.7–10.3)
Chloride: 101 mmol/L (ref 96–106)
Creatinine, Ser: 1.07 mg/dL — ABNORMAL HIGH (ref 0.57–1.00)
Globulin, Total: 2 g/dL (ref 1.5–4.5)
Glucose: 110 mg/dL — ABNORMAL HIGH (ref 70–99)
Potassium: 4.1 mmol/L (ref 3.5–5.2)
Sodium: 141 mmol/L (ref 134–144)
Total Protein: 6.4 g/dL (ref 6.0–8.5)
eGFR: 53 mL/min/{1.73_m2} — ABNORMAL LOW (ref >59)

## 2022-01-06 LAB — HEMOGLOBIN A1C
Est. average glucose Bld gHb Est-mCnc: 128 mg/dL
Hgb A1c MFr Bld: 6.1 % — ABNORMAL HIGH (ref 4.8–5.6)

## 2022-01-06 LAB — TSH+FREE T4
Free T4: 1.57 ng/dL (ref 0.82–1.77)
TSH: 1.83 u[IU]/mL (ref 0.450–4.500)

## 2022-01-07 NOTE — Progress Notes (Signed)
Blood chemistry shows slightly improved kidney function; this is reassuring. Continue to recommend water as drink of choice.   Cholesterol remains stable. I recommend diet low in saturated fat and regular exercise - 30 min at least 5 times per week The 10-year ASCVD risk score (Arnett DK, et al., 2019) is: 47.6%   Values used to calculate the score:     Age: 81 years     Sex: Female     Is Non-Hispanic African American: No     Diabetic: No     Tobacco smoker: No     Systolic Blood Pressure: 694 mmHg     Is BP treated: Yes     HDL Cholesterol: 44 mg/dL     Total Cholesterol: 180 mg/dL Heart attack and stroke risk is 48% estimated within the next 10 years which is very high.   A1c remains stable in pre-diabetic range. Continue to recommend balanced, lower carb meals. Smaller meal size, adding snacks. Choosing water as drink of choice and increasing purposeful exercise.  Thyroid remains stable.  Blood counts are stable.  [name], [address] [place], [place] [address] [phone] (phone) [phone] (fax) [place]

## 2022-01-08 ENCOUNTER — Other Ambulatory Visit: Payer: Self-pay | Admitting: Family Medicine

## 2022-01-08 ENCOUNTER — Telehealth: Payer: Self-pay

## 2022-01-08 DIAGNOSIS — I10 Essential (primary) hypertension: Secondary | ICD-10-CM

## 2022-01-08 MED ORDER — AMLODIPINE BESYLATE 5 MG PO TABS: 5.0000 mg | ORAL_TABLET | Freq: Every day | ORAL | 2 refills | Status: DC

## 2022-01-08 NOTE — Telephone Encounter (Signed)
-----   Message from [name], FNP sent at 01/08/2022  4:42 PM EDT ----- Regarding: FW: HTN- uncontrolled New Rx for Norvasc; this will replace verapamil. Continue all other medications as previously prescribed.  Patient should be contacted from Arida's office for f/u ----- Message ----- From: Wellington Hampshire, MD Sent: 01/08/2022  11:19 AM EDT To: Althia Forts, FNP Subject: RE: HTN- uncontrolled                          Carvedilol is better than atenolol and blood pressure control.  I recommend switching verapamil to amlodipine 5 mg once daily.  Thanks.  Gabriel Cirri, please schedule the patient for a follow-up visit in the next few weeks to recheck Blood pressure issues.   ----- Message ----- From: [name], FNP Sent: 01/05/2022   4:17 PM EDT To: Wellington Hampshire, MD Subject: HTN- uncontrolled                              BP was elevated in office today; appears to not be well controlled since change to coreg from atenolol. What do you want to change?  Thanks, [name], [address] [place], [place] [address] [phone] (phone) [phone] (fax) [place]

## 2022-01-14 DIAGNOSIS — M545 Low back pain, unspecified: Secondary | ICD-10-CM | POA: Diagnosis not present

## 2022-01-23 ENCOUNTER — Ambulatory Visit: Payer: Medicare PPO | Attending: Cardiovascular Disease | Admitting: Cardiovascular Disease

## 2022-01-23 ENCOUNTER — Encounter: Payer: Self-pay | Admitting: Cardiovascular Disease

## 2022-01-23 VITALS — BP 152/90 | HR 66 | Ht 65.0 in | Wt 194.4 lb

## 2022-01-23 DIAGNOSIS — I10 Essential (primary) hypertension: Secondary | ICD-10-CM

## 2022-01-23 DIAGNOSIS — E785 Hyperlipidemia, unspecified: Secondary | ICD-10-CM

## 2022-01-23 DIAGNOSIS — R0609 Other forms of dyspnea: Secondary | ICD-10-CM | POA: Diagnosis not present

## 2022-01-23 MED ORDER — AMLODIPINE BESYLATE 5 MG PO TABS: 5.0000 mg | ORAL_TABLET | Freq: Every day | ORAL | 3 refills | Status: DC

## 2022-01-23 MED ORDER — CARVEDILOL 12.5 MG PO TABS: 12.5000 mg | ORAL_TABLET | Freq: Two times a day (BID) | ORAL | 3 refills | Status: DC

## 2022-01-23 NOTE — Patient Instructions (Signed)
Medication Instructions:  Your physician recommends that you continue on your current medications as directed. Please refer to the Current Medication list given to you today.  *If you need a refill on your cardiac medications before your next appointment, please call your pharmacy*   Lab Work: None ordered If you have labs (blood work) drawn today and your tests are completely normal, you will receive your results only by: [place] (if you have MyChart) OR A paper copy in the mail If you have any lab test that is abnormal or we need to change your treatment, we will call you to review the results.   Testing/Procedures: Your physician has requested that you have a renal artery duplex. During this test, an ultrasound is used to evaluate blood flow to the kidneys. Allow one hour for this exam. Do not eat after midnight the day before and avoid carbonated beverages. Take your medications as you usually do.    Follow-Up: At [facility], you and your health needs are our priority.  As part of our continuing mission to provide you with exceptional heart care, we have created designated Provider Care Teams.  These Care Teams include your primary Cardiologist (physician) and Advanced Practice Providers (APPs -  Physician Assistants and Nurse Practitioners) who all work together to provide you with the care you need, when you need it.  We recommend signing up for the patient portal called "MyChart".  Sign up information is provided on this After Visit Summary.  MyChart is used to connect with patients for Virtual Visits (Telemedicine).  Patients are able to view lab/test results, encounter notes, upcoming appointments, etc.  Non-urgent messages can be sent to your provider as well.   To learn more about what you can do with MyChart, go to [email or web address].    Your next appointment:   3 month(s)  The format for your next appointment:   In Person  Provider:   You may see  Kathlyn Sacramento, MD or one of the following Advanced Practice Providers on your designated Care Team:   Murray Hodgkins, NP Christell Faith, PA-C Cadence Kathlen Mody, PA-C Gerrie Nordmann, NP    Other Instructions N/A  Important Information About Sugar

## 2022-01-23 NOTE — Progress Notes (Signed)
Cardiology Office Note   Date:  01/23/2022   ID:  Cathy Barnes, DOB 11/30/1940, MRN [id number]  PCP:  [name], FNP  Cardiologist:   Kathlyn Sacramento, MD   Chief Complaint  Patient presents with   Other    BP issues. Meds reviewed verbally with pt.      History of Present Illness: Cathy Barnes is a 81 y.o. female who is here today for follow-up visit regarding uncontrolled hypertension.   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 She was seen by me in 2021 for dyspnea with minimal exertion and atypical right-sided chest discomfort.  She underwent a stress test which showed no evidence of ischemia or infarct.  Echocardiogram showed normal LV systolic function with grade 1 diastolic dysfunction no significant valvular abnormalities.  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  She has been in anticoagulation with Eliquis since then.  The patient has known history of difficult to control hypertension.  She was on atenolol in the past which was switched to carvedilol.  Also stopped verapamil and decided to start amlodipine recently.  She continues to be on losartan and hydrochlorothiazide triamterene.  Blood pressure slightly improved after the recent changes but still not optimal.  She denies chest pain or shortness of breath.  Past Medical History:  Diagnosis Date   Allergy    some   Cataract    bilaterally both eyes    Chest pain    a. 07/2019 MV: EF 77%, no ischemia/infarct.   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 08/2019 Echo: EF 55-60%, no rwma, Gr1 DD, nl RV size/fxn.   Gallstones    GERD (gastroesophageal reflux disease)    H/O blood clots    Hydrocephalus (HCC)    [facility]    Hyperglycemia    Hyperlipidemia     Hypertension    Lymphocytosis    monoclonal b cell   Migraine, unspecified, without mention of intractable migraine without mention of status migrainosus 12/14/2012   Osteoarthritis, chronic    Osteoporosis    Peptic ulcer    some bleeding with ulcers as well     Past Surgical History:  Procedure Laterality Date   ABDOMINAL HYSTERECTOMY     BREAST CYST ASPIRATION Left 1993   Removed   breast cyst removed Right    benign    BREAST LUMPECTOMY Bilateral    COLONOSCOPY     ERCP N/A 02/04/2018   Procedure: ENDOSCOPIC RETROGRADE CHOLANGIOPANCREATOGRAPHY (ERCP);  Surgeon: Lucilla Lame, MD;  Location[facility] ENDOSCOPY;  Service: Endoscopy;  Laterality: N/A;   ERCP N/A 02/28/2018   Procedure: ENDOSCOPIC RETROGRADE CHOLANGIOPANCREATOGRAPHY (ERCP) STENT REMOVAL;  Surgeon: Lucilla Lame, MD;  Location: [facility] ENDOSCOPY;  Service: Endoscopy;  Laterality: N/A;   EYE SURGERY     growth removal Left 2019   L wrist growth removed, abnormal cells   History of Cataract surgery Right 2011   left 2011   PARTIAL HYSTERECTOMY     POLYPECTOMY     ROBOTIC ASSISTED LAPAROSCOPIC CHOLECYSTECTOMY-MULTI SITE N/A 03/08/2018   Procedure: ROBOTIC ASSISTED LAPAROSCOPIC CHOLECYSTECTOMY-MULTI SITE;  Surgeon: Jules Husbands, MD;  Location: [facility] ORS;  Service: General;  Laterality: N/A;   S/P ACL repair Left    knee   TONSILLECTOMY  TUBAL LIGATION  1975   Bilateral     Current Outpatient Medications  Medication Sig Dispense Refill   ALPRAZolam (XANAX) 0.25 MG tablet Take 1 tablet (0.25 mg total) by mouth at bedtime. 30 tablet 5   amLODipine (NORVASC) 5 MG tablet Take 1 tablet (5 mg total) by mouth daily. 30 tablet 2   carvedilol (COREG) 12.5 MG tablet Take 1 tablet (12.5 mg total) by mouth 2 (two) times daily with a meal. 180 tablet 3   Cholecalciferol (VITAMIN D3) 1.25 MG (50000 UT) CAPS Take 1 capsule by mouth once a week. 12 capsule 3   colestipol (COLESTID) 1 g tablet Take 1 tablet (1 g total) by mouth 2  (two) times daily. 180 tablet 5   cyclobenzaprine (FLEXERIL) 10 MG tablet Take 1 tablet (10 mg total) by mouth at bedtime as needed for muscle spasms. 90 tablet 3   donepezil (ARICEPT) 10 MG tablet Take 1 tablet (10 mg total) by mouth at bedtime. 90 tablet 4   ezetimibe (ZETIA) 10 MG tablet TAKE ONE TABLET BY MOUTH AT BEDTIME 90 tablet 0   gabapentin (NEURONTIN) 600 MG tablet TAKE ONE TABLET BY MOUTH TWICE A DAY 180 tablet 0   HYDROcodone-acetaminophen (NORCO/VICODIN) 5-325 MG tablet Take 1 tablet by mouth daily as needed for severe pain. 30 tablet 0   levothyroxine (SYNTHROID) 112 MCG tablet TAKE ONE TABLET BY MOUTH ONE TIME DAILY BEFORE BREAKFAST 90 tablet 3   losartan (COZAAR) 50 MG tablet TAKE ONE TABLET BY MOUTH AT BEDTIME 30 tablet 3   Magnesium 250 MG TABS Take 1 tablet by mouth daily.     potassium chloride SA (K-DUR) 20 MEQ tablet TAKE 1 TABLET BY MOUTH TWICE DAILY 180 tablet 1   solifenacin (VESICARE) 10 MG tablet TAKE ONE TABLET BY MOUTH ONE TIME DAILY 30 tablet 2   triamcinolone ointment (KENALOG) 0.5 % Apply 1 Application topically 2 (two) times daily. Apply to R external ear twice daily for max of 14 days. 30 g 0   triamterene-hydrochlorothiazide (MAXZIDE) 75-50 MG tablet Take 0.5 tablets by mouth daily. 30 tablet 0   zolpidem (AMBIEN) 10 MG tablet Take 1 tablet (10 mg total) by mouth at bedtime. 30 tablet 5   No current facility-administered medications for this visit.    Allergies:   Shellfish-derived products, Shellfish allergy, and Latex    Social History:  The patient  reports that she has never smoked. She has never used smokeless tobacco. She reports current alcohol use of about 1.0 - 3.0 standard drink of alcohol per week. She reports that she does not use drugs.   Family History:  The patient's family history includes Colon cancer in her father and mother; Heart disease in her father; Hypertension in her father and mother.    ROS:  Please see the history of present  illness.   Otherwise, review of systems are positive for none.   All other systems are reviewed and negative.    PHYSICAL EXAM: VS:  BP (!) 152/90 (BP Location: Left Arm, Patient Position: Sitting, Cuff Size: Normal)   Pulse 66   Ht '5\' 5"'$  (1.651 m)   Wt 194 lb 6 oz (88.2 kg)   SpO2 95%   BMI 32.35 kg/m  , BMI Body mass index is 32.35 kg/m. GEN: Well nourished, well developed, in no acute distress  HEENT: normal  Neck: no JVD, carotid bruits, or masses Cardiac: RRR; no murmurs, rubs, or gallops,no edema  Respiratory:  clear to auscultation  bilaterally, normal work of breathing GI: soft, nontender, nondistended, + BS MS: no deformity or atrophy  Skin: warm and dry, no rash Neuro:  Strength and sensation are intact Psych: euthymic mood, full affect   EKG:  EKG is ordered today. The ekg ordered today demonstrates normal sinus rhythm with poor R wave progression in the precordial leads.   Recent Labs: 01/05/2022: ALT 37; BUN 18; Creatinine, Ser 1.07; Hemoglobin 15.1; Platelets 260; Potassium 4.1; Sodium 141; TSH 1.830    Lipid Panel    Component Value Date/Time   CHOL 180 01/05/2022 1445   TRIG 188 (H) 01/05/2022 1445   HDL 44 01/05/2022 1445   CHOLHDL 4.1 01/05/2022 1445   CHOLHDL 3.8 07/01/2020 1359   VLDL 35 07/01/2020 1359   LDLCALC 103 (H) 01/05/2022 1445      Wt Readings from Last 3 Encounters:  01/23/22 194 lb 6 oz (88.2 kg)  01/05/22 202 lb (91.6 kg)  10/03/21 201 lb 6.4 oz (91.4 kg)          08/01/2019    2:58 PM  PAD Screen  Previous PAD dx? No  Previous surgical procedure? No  Pain with walking? Yes  Subsides with rest? Yes  Feet/toe relief with dangling? No  Painful, non-healing ulcers? No  Extremities discolored? No      ASSESSMENT AND PLAN:  1.  Refractory hypertension: Blood pressure is still not controlled and spite of 4 antihypertensive medications including a diuretic.  Due to that, I requested renal artery duplex to evaluate for  possible renal artery stenosis.  Continue current medications for now and we can consider increasing losartan in the near future.  2.  Exertional dyspnea: Negative cardiac work-up in 2021 including nuclear stress test and echocardiogram.  3.  Hyperlipidemia: Currently on Zetia 10 mg daily.  Most recent lipid profile showed an LDL of 100.    Disposition:   FU in 3 months   Signed,  Kathlyn Sacramento, MD  01/23/2022 10:11 AM    [place]

## 2022-01-27 ENCOUNTER — Other Ambulatory Visit: Payer: Self-pay | Admitting: Family Medicine

## 2022-01-27 DIAGNOSIS — I10 Essential (primary) hypertension: Secondary | ICD-10-CM

## 2022-01-27 DIAGNOSIS — E034 Atrophy of thyroid (acquired): Secondary | ICD-10-CM

## 2022-02-02 ENCOUNTER — Other Ambulatory Visit: Payer: Self-pay

## 2022-02-02 ENCOUNTER — Other Ambulatory Visit: Payer: Self-pay | Admitting: Family Medicine

## 2022-02-02 DIAGNOSIS — N3281 Overactive bladder: Secondary | ICD-10-CM

## 2022-02-02 DIAGNOSIS — D508 Other iron deficiency anemias: Secondary | ICD-10-CM

## 2022-02-03 ENCOUNTER — Encounter: Payer: Self-pay | Admitting: Oncology

## 2022-02-03 ENCOUNTER — Inpatient Hospital Stay: Payer: Medicare PPO | Admitting: Oncology

## 2022-02-03 ENCOUNTER — Inpatient Hospital Stay: Payer: Medicare PPO | Attending: Oncology

## 2022-02-03 VITALS — BP 141/65 | HR 68 | Temp 96.4°F | Resp 18 | Wt 200.5 lb

## 2022-02-03 DIAGNOSIS — C9110 Chronic lymphocytic leukemia of B-cell type not having achieved remission: Secondary | ICD-10-CM

## 2022-02-03 DIAGNOSIS — D509 Iron deficiency anemia, unspecified: Secondary | ICD-10-CM | POA: Diagnosis not present

## 2022-02-03 DIAGNOSIS — Z86718 Personal history of other venous thrombosis and embolism: Secondary | ICD-10-CM | POA: Insufficient documentation

## 2022-02-03 DIAGNOSIS — D508 Other iron deficiency anemias: Secondary | ICD-10-CM

## 2022-02-03 DIAGNOSIS — C9111 Chronic lymphocytic leukemia of B-cell type in remission: Secondary | ICD-10-CM | POA: Diagnosis not present

## 2022-02-03 LAB — CBC WITH DIFFERENTIAL/PLATELET
Abs Immature Granulocytes: 0.05 10*3/uL (ref 0.00–0.07)
Basophils Absolute: 0.1 10*3/uL (ref 0.0–0.1)
Basophils Relative: 0 %
Eosinophils Absolute: 0.4 10*3/uL (ref 0.0–0.5)
Eosinophils Relative: 2 %
HCT: 44.2 % (ref 36.0–46.0)
Hemoglobin: 14.7 g/dL (ref 12.0–15.0)
Immature Granulocytes: 0 %
Lymphocytes Relative: 66 %
Lymphs Abs: 10 10*3/uL — ABNORMAL HIGH (ref 0.7–4.0)
MCH: 30.7 pg (ref 26.0–34.0)
MCHC: 33.3 g/dL (ref 30.0–36.0)
MCV: 92.3 fL (ref 80.0–100.0)
Monocytes Absolute: 0.7 10*3/uL (ref 0.1–1.0)
Monocytes Relative: 5 %
Neutro Abs: 4.1 10*3/uL (ref 1.7–7.7)
Neutrophils Relative %: 27 %
Platelets: 247 10*3/uL (ref 150–400)
RBC: 4.79 MIL/uL (ref 3.87–5.11)
RDW: 13.6 % (ref 11.5–15.5)
WBC: 15.3 10*3/uL — ABNORMAL HIGH (ref 4.0–10.5)
nRBC: 0 % (ref 0.0–0.2)

## 2022-02-03 LAB — IRON AND TIBC
Iron: 108 ug/dL (ref 28–170)
Saturation Ratios: 32 % — ABNORMAL HIGH (ref 10.4–31.8)
TIBC: 342 ug/dL (ref 250–450)
UIBC: 234 ug/dL

## 2022-02-03 LAB — FERRITIN: Ferritin: 190 ng/mL (ref 11–307)

## 2022-02-04 ENCOUNTER — Encounter: Payer: Self-pay | Admitting: Oncology

## 2022-02-04 NOTE — Progress Notes (Signed)
Hematology/Oncology Consult note [facility]  [phone] [phone]  Patient Care Team: [name], FNP as PCP - General (Family Medicine) Wellington Hampshire, MD as PCP - Cardiology (Cardiology) Monna Fam, MD as Consulting Physician (Ophthalmology) [name], MD as Consulting Physician (Neurology)   Name of the [name]  [id number]  07/08/1940   Date of visit: 02/04/22  Diagnosis-  1.  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  Moderate eosinophilia possibly secondary to CLL etiology unclear  3. LLE DVT in October 2022  Chief complaint/ Reason for visit-routine follow-up of CLL and iron deficiency anemia  Heme/Onc history: patient is a 81 year-old female with a past medical history significant for hypertension, vitamin D deficiency and hypothyroidism. She has been sent to [place] for evaluation of leukocytosis. Over the last 1 year patient's white count has been around 12 with predominantly lymphocytosis and monocytosis as well as eosinophilia. No evidence of anemia or thrombocytopenia. Overall patient is doing well and denies any complaints of fevers, chills, unintentional weight loss, fatigue or night sweats. Denies any lumps or bumps anywhere   Further blood work from 07-2016 was as follows: CBC showed white count of 12.9 with an absolute lymphocyte count of 6.6 and an eosinophilia of 1.1. H&H was 13.4/41.6 and a platelet count of 270. CMP was unremarkable. Review of peripheral smear showed mild leukocytosis with absolute lymphocytosis and eosinophilia. BCR abl testing was negative for CML. Flow cytometry showed CD5 positive, CD23 positive clonal B-cell population CLL/SLL phenotype CD38 negative. 36% of leukocytes are less than 5000/mcL. Eosinophilia   Patient found to have left lower extremity DVT in October 2022 when she presented with left calf pain and swelling.  Occlusive DVT noted in the left popliteal vein  posterior tibial vein and peroneal vein.  She is on Eliquis since then.  Patient noted to be iron deficient based on labs in June 2023 and received IV iron  Interval history-patient did not notice much of a difference after receiving IV iron in June 2023.  Presently she is doing well.  Appetite and weight have remained stable.  ECOG PS- 1 Pain scale- 0  Review of systems- Review of Systems  Constitutional:  Negative for chills, fever, malaise/fatigue and weight loss.  HENT:  Negative for congestion, ear discharge and nosebleeds.   Eyes:  Negative for blurred vision.  Respiratory:  Negative for cough, hemoptysis, sputum production, shortness of breath and wheezing.   Cardiovascular:  Negative for chest pain, palpitations, orthopnea and claudication.  Gastrointestinal:  Negative for abdominal pain, blood in stool, constipation, diarrhea, heartburn, melena, nausea and vomiting.  Genitourinary:  Negative for dysuria, flank pain, frequency, hematuria and urgency.  Musculoskeletal:  Negative for back pain, joint pain and myalgias.  Skin:  Negative for rash.  Neurological:  Negative for dizziness, tingling, focal weakness, seizures, weakness and headaches.  Endo/Heme/Allergies:  Does not bruise/bleed easily.  Psychiatric/Behavioral:  Negative for depression and suicidal ideas. The patient does not have insomnia.       Allergies  Allergen Reactions   Shellfish-Derived Products Anaphylaxis    Throat closes, rash   Shellfish Allergy     Throat closes, rash   Latex Rash    Tapes,adhesives     Past Medical History:  Diagnosis Date   Allergy    some   Cataract    bilaterally both eyes    Chest pain    a. 07/2019 MV: EF 77%, no  ischemia/infarct.   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 08/2019 Echo: EF 55-60%, no rwma, Gr1 DD, nl RV size/fxn.   Gallstones    GERD (gastroesophageal reflux disease)    H/O blood clots    Hydrocephalus (HCC)     [facility]    Hyperglycemia    Hyperlipidemia    Hypertension    Lymphocytosis    monoclonal b cell   Migraine, unspecified, without mention of intractable migraine without mention of status migrainosus 12/14/2012   Osteoarthritis, chronic    Osteoporosis    Peptic ulcer    some bleeding with ulcers as well      Past Surgical History:  Procedure Laterality Date   ABDOMINAL HYSTERECTOMY     BREAST CYST ASPIRATION Left 1993   Removed   breast cyst removed Right    benign    BREAST LUMPECTOMY Bilateral    COLONOSCOPY     ERCP N/A 02/04/2018   Procedure: ENDOSCOPIC RETROGRADE CHOLANGIOPANCREATOGRAPHY (ERCP);  Surgeon: Lucilla Lame, MD;  Location: [facility] ENDOSCOPY;  Service: Endoscopy;  Laterality: N/A;   ERCP N/A 02/28/2018   Procedure: ENDOSCOPIC RETROGRADE CHOLANGIOPANCREATOGRAPHY (ERCP) STENT REMOVAL;  Surgeon: Lucilla Lame, MD;  Location: [facility] ENDOSCOPY;  Service: Endoscopy;  Laterality: N/A;   EYE SURGERY     growth removal Left 2019   L wrist growth removed, abnormal cells   History of Cataract surgery Right 2011   left 2011   PARTIAL HYSTERECTOMY     POLYPECTOMY     ROBOTIC ASSISTED LAPAROSCOPIC CHOLECYSTECTOMY-MULTI SITE N/A 03/08/2018   Procedure: ROBOTIC ASSISTED LAPAROSCOPIC CHOLECYSTECTOMY-MULTI SITE;  Surgeon: Jules Husbands, MD;  Location: [facility] ORS;  Service: General;  Laterality: N/A;   S/P ACL repair Left    knee   TONSILLECTOMY     TUBAL LIGATION  1975   Bilateral    Social History   Socioeconomic History   Marital status: Divorced    Spouse name: Not on file   Number of children: 1   Years of education: college   Highest education level: Not on file  Occupational History   Occupation: book Production designer, theatre/television/film: OTHER    Comment: Paediatric nurse  Tobacco Use   Smoking status: Never   Smokeless tobacco: Never  Vaping Use   Vaping Use: Never used  Substance and Sexual Activity   Alcohol use: Yes    Alcohol/week: 1.0 - 3.0 standard  drink of alcohol    Types: 1 - 3 Glasses of wine per week    Comment: ocassionally   Drug use: No   Sexual activity: Not Currently  Other Topics Concern   Not on file  Social History Narrative   Patient is right handed, resides with daughter and grandson in her home. Divorced.      Social Determinants of Health   Financial Resource Strain: Low Risk  (07/31/2021)   Overall Financial Resource Strain (CARDIA)    Difficulty of Paying Living Expenses: Not hard at all  Food Insecurity: No Food Insecurity (07/31/2021)   Hunger Vital Sign    Worried About Running Out of Food in the Last Year: Never true    Ran Out of Food in the Last Year: Never true  Transportation Needs: No Transportation Needs (07/31/2021)   PRAPARE - Hydrologist (Medical): No    Lack of Transportation (Non-Medical): No  Physical Activity: Insufficiently Active (07/31/2021)   Exercise  Vital Sign    Days of Exercise per Week: 2 days    Minutes of Exercise per Session: 20 min  Stress: No Stress Concern Present (07/31/2021)   [place]    Feeling of Stress : Only a little  Social Connections: Socially Isolated (07/31/2021)   Social Connection and Isolation Panel [NHANES]    Frequency of Communication with Friends and Family: More than three times a week    Frequency of Social Gatherings with Friends and Family: Three times a week    Attends Religious Services: Never    Active Member of Clubs or Organizations: No    Attends Archivist Meetings: Never    Marital Status: Widowed  Intimate Partner Violence: Not At Risk (07/31/2021)   Humiliation, Afraid, Rape, and Kick questionnaire    Fear of Current or Ex-Partner: No    Emotionally Abused: No    Physically Abused: No    Sexually Abused: No    Family History  Problem Relation Age of Onset   Colon cancer Mother    Hypertension Mother    Colon cancer Father     Heart disease Father    Hypertension Father    Colon polyps Neg Hx    Rectal cancer Neg Hx    Stomach cancer Neg Hx    Esophageal cancer Neg Hx      Current Outpatient Medications:    ALPRAZolam (XANAX) 0.25 MG tablet, Take 1 tablet (0.25 mg total) by mouth at bedtime., Disp: 30 tablet, Rfl: 5   amLODipine (NORVASC) 5 MG tablet, Take 1 tablet (5 mg total) by mouth daily., Disp: 90 tablet, Rfl: 3   carvedilol (COREG) 12.5 MG tablet, Take 1 tablet (12.5 mg total) by mouth 2 (two) times daily with a meal., Disp: 180 tablet, Rfl: 3   Cholecalciferol (VITAMIN D3) 1.25 MG (50000 UT) CAPS, Take 1 capsule by mouth once a week., Disp: 12 capsule, Rfl: 3   colestipol (COLESTID) 1 g tablet, Take 1 tablet (1 g total) by mouth 2 (two) times daily., Disp: 180 tablet, Rfl: 5   donepezil (ARICEPT) 10 MG tablet, Take 1 tablet (10 mg total) by mouth at bedtime., Disp: 90 tablet, Rfl: 4   ezetimibe (ZETIA) 10 MG tablet, TAKE ONE TABLET BY MOUTH AT BEDTIME, Disp: 90 tablet, Rfl: 0   gabapentin (NEURONTIN) 600 MG tablet, TAKE ONE TABLET BY MOUTH TWICE A DAY, Disp: 180 tablet, Rfl: 0   levothyroxine (SYNTHROID) 112 MCG tablet, TAKE ONE TABLET BY MOUTH ONE TIME DAILY BEFORE BREAKFAST, Disp: 90 tablet, Rfl: 3   losartan (COZAAR) 50 MG tablet, TAKE ONE TABLET BY MOUTH AT BEDTIME, Disp: 30 tablet, Rfl: 3   Magnesium 250 MG TABS, Take 1 tablet by mouth daily., Disp: , Rfl:    pantoprazole (PROTONIX) 40 MG tablet, Take 1 tablet by mouth every morning., Disp: , Rfl:    potassium chloride SA (K-DUR) 20 MEQ tablet, TAKE 1 TABLET BY MOUTH TWICE DAILY, Disp: 180 tablet, Rfl: 1   solifenacin (VESICARE) 10 MG tablet, TAKE ONE TABLET BY MOUTH ONE TIME DAILY, Disp: 30 tablet, Rfl: 2   triamterene-hydrochlorothiazide (MAXZIDE) 75-50 MG tablet, Take 0.5 tablets by mouth daily., Disp: 30 tablet, Rfl: 0   zolpidem (AMBIEN) 10 MG tablet, Take 1 tablet (10 mg total) by mouth at bedtime., Disp: 30 tablet, Rfl: 5   cyclobenzaprine  (FLEXERIL) 10 MG tablet, Take 1 tablet (10 mg total) by mouth at bedtime as needed  for muscle spasms. (Patient not taking: Reported on 02/03/2022), Disp: 90 tablet, Rfl: 3   ELIQUIS 5 MG TABS tablet, Take 5 mg by mouth 2 (two) times daily. (Patient not taking: Reported on 02/03/2022), Disp: , Rfl:    HYDROcodone-acetaminophen (NORCO/VICODIN) 5-325 MG tablet, Take 1 tablet by mouth daily as needed for severe pain. (Patient not taking: Reported on 02/03/2022), Disp: 30 tablet, Rfl: 0   triamcinolone ointment (KENALOG) 0.5 %, Apply 1 Application topically 2 (two) times daily. Apply to R external ear twice daily for max of 14 days., Disp: 30 g, Rfl: 0  Physical exam:  Vitals:   02/03/22 1348  BP: (!) 141/65  Pulse: 68  Resp: 18  Temp: (!) 96.4 F (35.8 C)  SpO2: 97%  Weight: 200 lb 8 oz (90.9 kg)   Physical Exam Constitutional:      General: She is not in acute distress. Cardiovascular:     Rate and Rhythm: Normal rate and regular rhythm.     Heart sounds: Normal heart sounds.  Pulmonary:     Effort: Pulmonary effort is normal.     Breath sounds: Normal breath sounds.  Abdominal:     General: Bowel sounds are normal.     Palpations: Abdomen is soft.  Lymphadenopathy:     Comments: No palpable cervical, supraclavicular, axillary or inguinal adenopathy    Skin:    General: Skin is warm and dry.  Neurological:     Mental Status: She is alert and oriented to person, place, and time.         Latest Ref Rng & Units 01/05/2022    2:45 PM  CMP  Glucose 70 - 99 mg/dL 110   BUN 8 - 27 mg/dL 18   Creatinine 0.57 - 1.00 mg/dL 1.07   Sodium 134 - 144 mmol/L 141   Potassium 3.5 - 5.2 mmol/L 4.1   Chloride 96 - 106 mmol/L 101   CO2 20 - 29 mmol/L 26   Calcium 8.7 - 10.3 mg/dL 9.8   Total Protein 6.0 - 8.5 g/dL 6.4   Total Bilirubin 0.0 - 1.2 mg/dL 0.3   Alkaline Phos 44 - 121 IU/L 91   AST 0 - 40 IU/L 29   ALT 0 - 32 IU/L 37       Latest Ref Rng & Units 02/03/2022    1:28 PM   CBC  WBC 4.0 - 10.5 K/uL 15.3   Hemoglobin 12.0 - 15.0 g/dL 14.7   Hematocrit 36.0 - 46.0 % 44.2   Platelets 150 - 400 K/uL 247      Assessment and plan- Patient is a 81 y.o. female who is here for follow-up of following issues:  Stage 0 CLL: White cell count fluctuates between 12-18 and is presently within the same range.  No evidence of anemia or thrombocytopenia.  This does not require any treatment at this time and can be monitored conservatively.    History of iron deficiency: Patient received IV iron in June 2023.  Presently iron studies are within normal limits.  We will repeat CBC ferritin and iron studies in 6 months in 1 year and I will see her back in 1 year.  History of left lower extremity DVT: Patient is now off Eliquis with no concerning signs and symptoms of recurrence.  Continue to monitor   Visit Diagnosis 1. CLL (chronic lymphocytic leukemia) ([place])   2. Iron deficiency anemia, unspecified iron deficiency anemia type      Dr. Randa Evens,  MD, MPH [place] at [facility] [phone] 02/04/2022 12:56 PM

## 2022-02-10 DIAGNOSIS — M545 Low back pain, unspecified: Secondary | ICD-10-CM | POA: Diagnosis not present

## 2022-02-18 DIAGNOSIS — M545 Low back pain, unspecified: Secondary | ICD-10-CM | POA: Diagnosis not present

## 2022-02-26 ENCOUNTER — Other Ambulatory Visit: Payer: Self-pay | Admitting: Family Medicine

## 2022-02-26 DIAGNOSIS — E559 Vitamin D deficiency, unspecified: Secondary | ICD-10-CM

## 2022-03-03 ENCOUNTER — Other Ambulatory Visit: Payer: Self-pay | Admitting: Family Medicine

## 2022-03-03 DIAGNOSIS — I10 Essential (primary) hypertension: Secondary | ICD-10-CM

## 2022-03-08 ENCOUNTER — Other Ambulatory Visit: Payer: Self-pay | Admitting: Family Medicine

## 2022-03-08 DIAGNOSIS — G588 Other specified mononeuropathies: Secondary | ICD-10-CM

## 2022-03-10 DIAGNOSIS — M545 Low back pain, unspecified: Secondary | ICD-10-CM | POA: Diagnosis not present

## 2022-03-11 DIAGNOSIS — H40013 Open angle with borderline findings, low risk, bilateral: Secondary | ICD-10-CM | POA: Diagnosis not present

## 2022-03-18 DIAGNOSIS — M545 Low back pain, unspecified: Secondary | ICD-10-CM | POA: Diagnosis not present

## 2022-03-18 DIAGNOSIS — M5416 Radiculopathy, lumbar region: Secondary | ICD-10-CM | POA: Diagnosis not present

## 2022-03-20 ENCOUNTER — Ambulatory Visit: Payer: Medicare PPO | Attending: Cardiovascular Disease

## 2022-03-20 DIAGNOSIS — I10 Essential (primary) hypertension: Secondary | ICD-10-CM | POA: Diagnosis not present

## 2022-03-26 ENCOUNTER — Encounter: Payer: Self-pay | Admitting: *Deleted

## 2022-04-06 ENCOUNTER — Other Ambulatory Visit: Payer: Self-pay | Admitting: Family Medicine

## 2022-04-06 DIAGNOSIS — I10 Essential (primary) hypertension: Secondary | ICD-10-CM

## 2022-04-06 DIAGNOSIS — E7800 Pure hypercholesterolemia, unspecified: Secondary | ICD-10-CM

## 2022-04-07 NOTE — Telephone Encounter (Signed)
Requested Prescriptions  Pending Prescriptions Disp Refills   ezetimibe (ZETIA) 10 MG tablet [Pharmacy Med Name: EZETIMIBE 10 MG TAB[*]] 90 tablet 0    Sig: TAKE ONE TABLET BY MOUTH ONE TIME DAILY AT BEDTIME     Cardiovascular:  Antilipid - Sterol Transport Inhibitors Failed - 04/07/2022 11:37 AM      Failed - ALT in normal range and within 360 days    ALT  Date Value Ref Range Status  01/05/2022 37 (H) 0 - 32 IU/L Final         Failed - Lipid Panel in normal range within the last 12 months    Cholesterol, Total  Date Value Ref Range Status  01/05/2022 180 100 - 199 mg/dL Final   LDL Chol Calc (NIH)  Date Value Ref Range Status  01/05/2022 103 (H) 0 - 99 mg/dL Final   HDL  Date Value Ref Range Status  01/05/2022 44 >39 mg/dL Final   Triglycerides  Date Value Ref Range Status  01/05/2022 188 (H) 0 - 149 mg/dL Final         Passed - AST in normal range and within 360 days    AST  Date Value Ref Range Status  01/05/2022 29 0 - 40 IU/L Final         Passed - Patient is not pregnant      Passed - Valid encounter within last 12 months    Recent Outpatient Visits           3 months ago Chronic bilateral low back pain without sciatica   [facility] [name], FNP   6 months ago Essential (primary) hypertension   [facility] Tally Joe T, FNP   1 year ago Acute deep vein thrombosis (DVT) of proximal vein of left lower extremity [facility])   [facility] [name], FNP   1 year ago [place] Tally Joe T, FNP   1 year ago Acute midline low back pain without sciatica   [facility], Clearnce Sorrel, [place]       Future Appointments             In 3 weeks Sharolyn Douglas, Clance Boll, NP [place]. [facility]             amLODipine (NORVASC) 5 MG tablet [Pharmacy Med Name: AMLODIPINE 5 MG TAB[*]] 30 tablet 2    Sig: TAKE ONE  TABLET BY MOUTH ONE TIME DAILY     Cardiovascular: Calcium Channel Blockers 2 Failed - 04/06/2022  8:04 PM      Failed - Last BP in normal range    BP Readings from Last 1 Encounters:  02/03/22 (!) 141/65         Passed - Last Heart Rate in normal range    Pulse Readings from Last 1 Encounters:  02/03/22 68         Passed - Valid encounter within last 6 months    Recent Outpatient Visits           3 months ago Chronic bilateral low back pain without sciatica   [facility] [name], FNP   6 months ago Essential (primary) hypertension   [facility] Tally Joe T, FNP   1 year ago Acute deep vein thrombosis (DVT) of proximal vein of left lower extremity [facility])   [facility] [name],  FNP   1 year ago [place] Tally Joe T, FNP   1 year ago Acute midline low back pain without sciatica   [facility], Clearnce Sorrel, [place]       Future Appointments             In 3 weeks Sharolyn Douglas, Clance Boll, NP [place]. [place]

## 2022-04-07 NOTE — Telephone Encounter (Signed)
Requested medication (s) are due for refill today - no  Requested medication (s) are on the active medication list -yes  Future visit scheduled -no  Last refill: 01/23/22 #90 3RF  Notes to clinic: outside provider Rx  Requested Prescriptions  Pending Prescriptions Disp Refills   amLODipine (NORVASC) 5 MG tablet [Pharmacy Med Name: AMLODIPINE 5 MG TAB[*]] 30 tablet 2    Sig: TAKE ONE TABLET BY MOUTH ONE TIME DAILY     Cardiovascular: Calcium Channel Blockers 2 Failed - 04/06/2022  8:04 PM      Failed - Last BP in normal range    BP Readings from Last 1 Encounters:  02/03/22 (!) 141/65         Passed - Last Heart Rate in normal range    Pulse Readings from Last 1 Encounters:  02/03/22 68         Passed - Valid encounter within last 6 months    Recent Outpatient Visits           3 months ago Chronic bilateral low back pain without sciatica   [facility] [name], FNP   6 months ago Essential (primary) hypertension   [facility] Tally Joe T, FNP   1 year ago Acute deep vein thrombosis (DVT) of proximal vein of left lower extremity [facility])   [facility] [name], FNP   1 year ago [place] Tally Joe T, FNP   1 year ago Acute midline low back pain without sciatica   [facility], Clearnce Sorrel, [place]       Future Appointments             In 3 weeks Sharolyn Douglas, Clance Boll, NP [place]. [facility]            Signed Prescriptions Disp Refills   ezetimibe (ZETIA) 10 MG tablet 90 tablet 0    Sig: TAKE ONE TABLET BY MOUTH ONE TIME DAILY AT BEDTIME     Cardiovascular:  Antilipid - Sterol Transport Inhibitors Failed - 04/07/2022 11:37 AM      Failed - ALT in normal range and within 360 days    ALT  Date Value Ref Range Status  01/05/2022 37 (H) 0 - 32 IU/L Final         Failed - Lipid Panel in normal range within  the last 12 months    Cholesterol, Total  Date Value Ref Range Status  01/05/2022 180 100 - 199 mg/dL Final   LDL Chol Calc (NIH)  Date Value Ref Range Status  01/05/2022 103 (H) 0 - 99 mg/dL Final   HDL  Date Value Ref Range Status  01/05/2022 44 >39 mg/dL Final   Triglycerides  Date Value Ref Range Status  01/05/2022 188 (H) 0 - 149 mg/dL Final         Passed - AST in normal range and within 360 days    AST  Date Value Ref Range Status  01/05/2022 29 0 - 40 IU/L Final         Passed - Patient is not pregnant      Passed - Valid encounter within last 12 months    Recent Outpatient Visits           3 months ago Chronic bilateral low back pain without sciatica   [facility] [name], FNP   6 months ago  Essential (primary) hypertension   [facility] Tally Joe T, FNP   1 year ago Acute deep vein thrombosis (DVT) of proximal vein of left lower extremity [facility])   [facility] [name], FNP   1 year ago [place] Tally Joe T, FNP   1 year ago Acute midline low back pain without sciatica   [facility], Clearnce Sorrel, PA-C       Future Appointments             In 3 weeks Sharolyn Douglas, Clance Boll, NP [place]. [facility]               Requested Prescriptions  Pending Prescriptions Disp Refills   amLODipine (NORVASC) 5 MG tablet [Pharmacy Med Name: AMLODIPINE 5 MG TAB[*]] 30 tablet 2    Sig: TAKE ONE TABLET BY MOUTH ONE TIME DAILY     Cardiovascular: Calcium Channel Blockers 2 Failed - 04/06/2022  8:04 PM      Failed - Last BP in normal range    BP Readings from Last 1 Encounters:  02/03/22 (!) 141/65         Passed - Last Heart Rate in normal range    Pulse Readings from Last 1 Encounters:  02/03/22 68         Passed - Valid encounter within last 6 months    Recent Outpatient Visits           3  months ago Chronic bilateral low back pain without sciatica   [facility] [name], FNP   6 months ago Essential (primary) hypertension   [facility] Tally Joe T, FNP   1 year ago Acute deep vein thrombosis (DVT) of proximal vein of left lower extremity [facility])   [facility] [name], FNP   1 year ago [place] Tally Joe T, FNP   1 year ago Acute midline low back pain without sciatica   [facility], Clearnce Sorrel, [place]       Future Appointments             In 3 weeks Sharolyn Douglas, Clance Boll, NP [employer]. [facility]            Signed Prescriptions Disp Refills   ezetimibe (ZETIA) 10 MG tablet 90 tablet 0    Sig: TAKE ONE TABLET BY MOUTH ONE TIME DAILY AT BEDTIME     Cardiovascular:  Antilipid - Sterol Transport Inhibitors Failed - 04/07/2022 11:37 AM      Failed - ALT in normal range and within 360 days    ALT  Date Value Ref Range Status  01/05/2022 37 (H) 0 - 32 IU/L Final         Failed - Lipid Panel in normal range within the last 12 months    Cholesterol, Total  Date Value Ref Range Status  01/05/2022 180 100 - 199 mg/dL Final   LDL Chol Calc (NIH)  Date Value Ref Range Status  01/05/2022 103 (H) 0 - 99 mg/dL Final   HDL  Date Value Ref Range Status  01/05/2022 44 >39 mg/dL Final   Triglycerides  Date Value Ref Range Status  01/05/2022 188 (H) 0 - 149 mg/dL Final         Passed - AST in normal range and  within 360 days    AST  Date Value Ref Range Status  01/05/2022 29 0 - 40 IU/L Final         Passed - Patient is not pregnant      Passed - Valid encounter within last 12 months    Recent Outpatient Visits           3 months ago Chronic bilateral low back pain without sciatica   [facility] [name], FNP   6 months ago Essential (primary) hypertension   [facility] Tally Joe T, FNP   1 year ago Acute deep vein thrombosis (DVT) of proximal vein of left lower extremity [facility])   [facility] [name], FNP   1 year ago [place] Tally Joe T, FNP   1 year ago Acute midline low back pain without sciatica   [facility], Clearnce Sorrel, [place]       Future Appointments             In 3 weeks Sharolyn Douglas, Clance Boll, NP [place]. [place]

## 2022-04-08 ENCOUNTER — Other Ambulatory Visit: Payer: Self-pay | Admitting: Family Medicine

## 2022-04-08 DIAGNOSIS — G588 Other specified mononeuropathies: Secondary | ICD-10-CM

## 2022-04-20 DIAGNOSIS — M5416 Radiculopathy, lumbar region: Secondary | ICD-10-CM | POA: Diagnosis not present

## 2022-04-27 ENCOUNTER — Other Ambulatory Visit: Payer: Self-pay | Admitting: Family Medicine

## 2022-04-27 DIAGNOSIS — I10 Essential (primary) hypertension: Secondary | ICD-10-CM

## 2022-04-28 NOTE — Telephone Encounter (Signed)
Requested Prescriptions  Pending Prescriptions Disp Refills   verapamil (VERELAN PM) 120 MG 24 hr capsule [Pharmacy Med Name: VERAPAMIL ER 120 MG CAP] 90 capsule 3    Sig: TAKE ONE CAPSULE BY MOUTH ONE TIME DAILY     Cardiovascular: Calcium Channel Blockers 3 Failed - 04/27/2022  2:24 AM      Failed - ALT in normal range and within 360 days    ALT  Date Value Ref Range Status  01/05/2022 37 (H) 0 - 32 IU/L Final         Failed - Cr in normal range and within 360 days    Creatinine, Ser  Date Value Ref Range Status  01/05/2022 1.07 (H) 0.57 - 1.00 mg/dL Final         Failed - Last BP in normal range    BP Readings from Last 1 Encounters:  02/03/22 (!) 141/65         Passed - AST in normal range and within 360 days    AST  Date Value Ref Range Status  01/05/2022 29 0 - 40 IU/L Final         Passed - Last Heart Rate in normal range    Pulse Readings from Last 1 Encounters:  02/03/22 68         Passed - Valid encounter within last 6 months    Recent Outpatient Visits           3 months ago Chronic bilateral low back pain without sciatica   [facility] [name], FNP   6 months ago Essential (primary) hypertension   [facility] Tally Joe T, FNP   1 year ago Acute deep vein thrombosis (DVT) of proximal vein of left lower extremity [facility])   [facility] [name], FNP   1 year ago [place] Tally Joe T, FNP   1 year ago Acute midline low back pain without sciatica   [facility], Clearnce Sorrel, PA-C       Future Appointments             In 3 days Sharolyn Douglas, Clance Boll, NP [place]. [place]

## 2022-05-01 ENCOUNTER — Ambulatory Visit: Payer: Medicare PPO | Attending: Nurse Practitioner | Admitting: Nurse Practitioner

## 2022-05-01 ENCOUNTER — Encounter: Payer: Self-pay | Admitting: Nurse Practitioner

## 2022-05-01 VITALS — BP 135/70 | HR 71 | Ht 65.0 in | Wt 201.0 lb

## 2022-05-01 DIAGNOSIS — I10 Essential (primary) hypertension: Secondary | ICD-10-CM | POA: Diagnosis not present

## 2022-05-01 DIAGNOSIS — R0609 Other forms of dyspnea: Secondary | ICD-10-CM

## 2022-05-01 DIAGNOSIS — E785 Hyperlipidemia, unspecified: Secondary | ICD-10-CM

## 2022-05-01 DIAGNOSIS — M5416 Radiculopathy, lumbar region: Secondary | ICD-10-CM | POA: Diagnosis not present

## 2022-05-01 DIAGNOSIS — N183 Chronic kidney disease, stage 3 unspecified: Secondary | ICD-10-CM | POA: Diagnosis not present

## 2022-05-01 MED ORDER — LOSARTAN POTASSIUM 100 MG PO TABS: 100.0000 mg | ORAL_TABLET | Freq: Every day | ORAL | 3 refills | Status: DC

## 2022-05-01 NOTE — Progress Notes (Signed)
Office Visit    Patient Name: Cathy Barnes Date of Encounter: 05/01/2022  Primary Care Provider:  [name], FNP Primary Cardiologist:  Kathlyn Sacramento, MD  Chief Complaint    82 year old female with history of hypertension, hyperlipidemia, borderline diabetes, hypothyroidism, CKD III, and chronic lymphocytic leukemia, presents for follow-up related to HTN.  Past Medical History    Past Medical History:  Diagnosis Date   Allergy    some   Cataract    bilaterally both eyes    Chest pain    a. 07/2019 MV: EF 77%, no ischemia/infarct.   CKD (chronic kidney disease), stage III (HCC)    CLL (chronic lymphocytic leukemia) (HCC)    stage 0   Diastolic dysfunction    a. 08/2019 Echo: EF 55-60%, no rwma, Gr1 DD, nl RV size/fxn.   Gallstones    GERD (gastroesophageal reflux disease)    H/O blood clots    Hydrocephalus ([place])    [facility]    Hyperglycemia    Hyperlipidemia    Hypertension    a. 03/2022 Renal Duplex: No RAS.   Lymphocytosis    monoclonal b cell   Migraine, unspecified, without mention of intractable migraine without mention of status migrainosus 12/14/2012   Osteoarthritis, chronic    Osteoporosis    Peptic ulcer    some bleeding with ulcers as well    Past Surgical History:  Procedure Laterality Date   ABDOMINAL HYSTERECTOMY     BREAST CYST ASPIRATION Left 1993   Removed   breast cyst removed Right    benign    BREAST LUMPECTOMY Bilateral    COLONOSCOPY     ERCP N/A 02/04/2018   Procedure: ENDOSCOPIC RETROGRADE CHOLANGIOPANCREATOGRAPHY (ERCP);  Surgeon: Lucilla Lame, MD;  Location: [facility] ENDOSCOPY;  Service: Endoscopy;  Laterality: N/A;   ERCP N/A 02/28/2018   Procedure: ENDOSCOPIC RETROGRADE CHOLANGIOPANCREATOGRAPHY (ERCP) STENT REMOVAL;  Surgeon: Lucilla Lame, MD;  Location: [facility] ENDOSCOPY;  Service: Endoscopy;  Laterality: N/A;   EYE SURGERY     growth removal Left 2019   L wrist growth removed, abnormal cells   History of Cataract  surgery Right 2011   left 2011   PARTIAL HYSTERECTOMY     POLYPECTOMY     ROBOTIC ASSISTED LAPAROSCOPIC CHOLECYSTECTOMY-MULTI SITE N/A 03/08/2018   Procedure: ROBOTIC ASSISTED LAPAROSCOPIC CHOLECYSTECTOMY-MULTI SITE;  Surgeon: Jules Husbands, MD;  Location: [facility] ORS;  Service: General;  Laterality: N/A;   S/P ACL repair Left    knee   TONSILLECTOMY     TUBAL LIGATION  1975   Bilateral   Allergies  Allergies  Allergen Reactions   Shellfish-Derived Products Anaphylaxis    Throat closes, rash   Shellfish Allergy     Throat closes, rash   Latex Rash    Tapes,adhesives    History of Present Illness    82 year old female with the above past medical history including hypertension, hyperlipidemia, borderline diabetes, hypothyroidism, CKD III, and chronic lymphocytic leukemia.  She was previously evaluated by cardiology almost 20 years ago in the setting of chest pain and reportedly underwent stress testing and echocardiography which showed no significant abnormalities.  In April 2021, she was seen by Dr. Fletcher Anon in the setting of dyspnea occurring with minimal activity as well as occasional right-sided chest squeezing and discomfort with exertion.  EKG was unremarkable.  Stress testing was performed and showed no ischemia or infarct.  Echo showed normal LV function with grade 1 diastolic dysfunction and no significant valvular abnormalities.  In October 2022, she was diagnosed with left popliteal DVT and was treated w/ 12 months of eliquis - she is no longer taking.  Ms. Kiger was last seen in cardiology clinic in October 2023 in the setting of difficult to control hypertension.  Though she had historically been managed with atenolol and verapamil, at that point, she was taking carvedilol, amlodipine, losartan, and triamterene-HCTZ.  Pressure was elevated at that visit and renal artery duplex was performed in December 2023, which showed no evidence of renal artery stenosis.  Since her last visit,  she has done well from a cardiac standpoint.  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  She says she can only walk about 10 minutes before symptoms become too severe.  She has an appoint with orthopedics after this visit today.  Her blood pressures at home have often been trending in the 140s and 150s, though she is 135/70 today.  She denies palpitations, PND, orthopnea, dizziness, syncope, or early satiety.  She does have some mild lower extremity edema that progresses throughout the day and resolves overnight.  Home Medications    Prior to Admission medications   Medication Sig Start Date End Date Taking? Authorizing Provider  ALPRAZolam (XANAX) 0.25 MG tablet Take 1 tablet (0.25 mg total) by mouth at bedtime. 01/05/22  Yes [name], FNP  amLODipine (NORVASC) 5 MG tablet Take 1 tablet (5 mg total) by mouth daily. 01/23/22  Yes Wellington Hampshire, MD  carvedilol (COREG) 12.5 MG tablet Take 1 tablet (12.5 mg total) by mouth 2 (two) times daily with a meal. 01/23/22  Yes Wellington Hampshire, MD  Cholecalciferol (VITAMIN D3) 1.25 MG (50000 UT) CAPS TAKE ONE CAPSULE BY MOUTH ONCE A WEEK 02/26/22  Yes [name], FNP  colestipol (COLESTID) 1 g tablet Take 1 tablet (1 g total) by mouth 2 (two) times daily. 01/05/22  Yes [name], FNP  cyclobenzaprine (FLEXERIL) 10 MG tablet Take 1 tablet (10 mg total) by mouth at bedtime as needed for muscle spasms. 09/22/21  Yes [name], MD  donepezil (ARICEPT) 10 MG tablet Take 1 tablet (10 mg total) by mouth at bedtime. 09/22/21  Yes [name], MD  ezetimibe (ZETIA) 10 MG tablet TAKE ONE TABLET BY MOUTH ONE TIME DAILY AT BEDTIME 04/07/22  Yes [name], FNP  gabapentin (NEURONTIN) 600 MG tablet TAKE ONE TABLET BY MOUTH TWICE A DAY 04/09/22  Yes [name], FNP  HYDROcodone-acetaminophen (NORCO/VICODIN)  5-325 MG tablet Take 1 tablet by mouth daily as needed for severe pain. 01/05/22  Yes [name], FNP  levothyroxine (SYNTHROID) 112 MCG tablet TAKE ONE TABLET BY MOUTH ONE TIME DAILY BEFORE BREAKFAST 01/28/22  Yes Tally Joe T, FNP  Magnesium 250 MG TABS Take 1 tablet by mouth daily.   Yes [provider]  pantoprazole (PROTONIX) 40 MG tablet Take 1 tablet by mouth every morning.   Yes [provider]  potassium chloride SA (K-DUR) 20 MEQ tablet TAKE 1 TABLET BY MOUTH TWICE DAILY 01/02/19  Yes Fenton Malling M, PA-C  solifenacin (VESICARE) 10 MG tablet TAKE ONE TABLET BY MOUTH ONE TIME DAILY 02/02/22  Yes Tally Joe T, FNP  triamterene-hydrochlorothiazide (MAXZIDE) 75-50 MG tablet TAKE ONE-HALF TABLET BY MOUTH ONE TIME DAILY 03/03/22  Yes [name], FNP  zolpidem (AMBIEN) 10 MG  tablet Take 1 tablet (10 mg total) by mouth at bedtime. 01/05/22  Yes [name], FNP  triamcinolone ointment (KENALOG) 0.5 % Apply 1 Application topically 2 (two) times daily. Apply to R external ear twice daily for max of 14 days. Patient not taking: Reported on 05/01/2022 01/05/22   Tally Joe T, FNP       Review of Systems    Left hip and low back pain as outlined above.  Mild dependent edema/venous insufficiency.  She denies chest pain, palpitations, dyspnea, pnd, orthopnea, n, v, dizziness, syncope, weight gain, or early satiety.  All other systems reviewed and are otherwise negative except as noted above.    Physical Exam    VS:  BP 135/70 (BP Location: Left Arm, Patient Position: Sitting, Cuff Size: Normal)   Pulse 71   Ht '5\' 5"'$  (1.651 m)   Wt 201 lb (91.2 kg)   SpO2 95%   BMI 33.45 kg/m  , BMI Body mass index is 33.45 kg/m.     GEN: Well nourished, well developed, in no acute distress. HEENT: normal. Neck: Supple, no JVD, carotid bruits, or masses. Cardiac: RRR, no murmurs, rubs, or gallops. No clubbing, cyanosis, trace bilat LE edema.  Radials 2+/PT 2+ and equal  bilaterally.  Respiratory:  Respirations regular and unlabored, clear to auscultation bilaterally. GI: Soft, nontender, nondistended, BS + x 4. MS: no deformity or atrophy. Skin: warm and dry, no rash. Neuro:  Strength and sensation are intact. Psych: Normal affect.  Accessory Clinical Findings    Lab Results  Component Value Date   WBC 15.3 (H) 02/03/2022   HGB 14.7 02/03/2022   HCT 44.2 02/03/2022   MCV 92.3 02/03/2022   PLT 247 02/03/2022   Lab Results  Component Value Date   CREATININE 1.07 (H) 01/05/2022   BUN 18 01/05/2022   NA 141 01/05/2022   K 4.1 01/05/2022   CL 101 01/05/2022   CO2 26 01/05/2022   Lab Results  Component Value Date   ALT 37 (H) 01/05/2022   AST 29 01/05/2022   ALKPHOS 91 01/05/2022   BILITOT 0.3 01/05/2022   Lab Results  Component Value Date   CHOL 180 01/05/2022   HDL 44 01/05/2022   LDLCALC 103 (H) 01/05/2022   TRIG 188 (H) 01/05/2022   CHOLHDL 4.1 01/05/2022    Lab Results  Component Value Date   HGBA1C 6.1 (H) 01/05/2022    Assessment & Plan    1.  Refractory hypertension: Patient with a long history of difficult to manage hypertension.  She is currently on amlodipine, carvedilol, losartan, and Maxide.  Pressure today 135/70 however, she reports that at home she is typically trending in the 140s to 150s.  Renal arterial duplex in December 2022 was without any significant renal artery stenosis.  She does have a history of stage III chronic kidney disease with stable creatinine in September.  I am going to increase her losartan to 100 mg daily with plan for follow-up basic metabolic panel next week.  2.  History of exertional dyspnea/diastolic dysfunction: Activity has been limited in the setting of low back and left hip pain related to bulging disks however, she denies dyspnea on exertion today.  Prior workup in 2021 notable for nonischemic stress testing and an echocardiogram which showed normal LV function and grade 1 diastolic  dysfunction.  She has only trace ankle edema in the setting of a history of venous insufficiency as well as amlodipine therapy.  Otherwise euvolemic on  examination.  Adjusting losartan as above to address hypertension.  Otherwise continue current regimen.  3.  Hyperlipidemia: On Zetia 10 mg daily with an LDL of 103 in September.  Pending resolution of current back issues, would like to see increase activity and weight loss.  4.  Stage III chronic kidney disease: Stable creatinine September.  Plan to follow-up basic metabolic profile in 1 week in the setting of increasing losartan.  5.  Disposition: Follow-up basic metabolic panel in 1 week.  Follow-up in 4 to 6 months or sooner if necessary.   Murray Hodgkins, NP 05/01/2022, 9:59 AM

## 2022-05-01 NOTE — Patient Instructions (Signed)
Medication Instructions:  - Your physician has recommended you make the following change in your medication:   1) INCREASE Losartan to 100 mg: - take 1 tablet by mouth once daily   *If you need a refill on your cardiac medications before your next appointment, please call your pharmacy*   Lab Work: - Your physician recommends that you return for lab work in: 1 week (around 05/08/22)  [place] at [facility] 1st desk on the right to check in (REGISTRATION)  Lab hours: Monday- Friday (7:30 am- 5:30 pm)   If you have labs (blood work) drawn today and your tests are completely normal, you will receive your results only by: MyChart Message (if you have MyChart) OR A paper copy in the mail If you have any lab test that is abnormal or we need to change your treatment, we will call you to review the results.   Testing/Procedures: - none ordered   Follow-Up: At [facility], you and your health needs are our priority.  As part of our continuing mission to provide you with exceptional heart care, we have created designated Provider Care Teams.  These Care Teams include your primary Cardiologist (physician) and Advanced Practice Providers (APPs -  Physician Assistants and Nurse Practitioners) who all work together to provide you with the care you need, when you need it.  We recommend signing up for the patient portal called "MyChart".  Sign up information is provided on this After Visit Summary.  MyChart is used to connect with patients for Virtual Visits (Telemedicine).  Patients are able to view lab/test results, encounter notes, upcoming appointments, etc.  Non-urgent messages can be sent to your provider as well.   To learn more about what you can do with MyChart, go to [email or web address].    Your next appointment:   4-6  month(s)  Provider:   You may see Kathlyn Sacramento, MD or one of the following Advanced Practice Providers on your designated Care Team:    Murray Hodgkins, NP    Other Instructions N/a

## 2022-05-05 ENCOUNTER — Other Ambulatory Visit: Payer: Self-pay | Admitting: Family Medicine

## 2022-05-05 DIAGNOSIS — I10 Essential (primary) hypertension: Secondary | ICD-10-CM

## 2022-05-05 NOTE — Telephone Encounter (Signed)
Requested Prescriptions  Pending Prescriptions Disp Refills   triamterene-hydrochlorothiazide (MAXZIDE) 75-50 MG tablet [Pharmacy Med Name: TRIAMTERENE-HCTZ 75-50 TAB] 30 tablet 0    Sig: TAKE ONE-HALF TABLET BY MOUTH ONE TIME DAILY     Cardiovascular: Diuretic Combos Failed - 05/05/2022 11:57 AM      Failed - Cr in normal range and within 180 days    Creatinine, Ser  Date Value Ref Range Status  01/05/2022 1.07 (H) 0.57 - 1.00 mg/dL Final         Passed - K in normal range and within 180 days    Potassium  Date Value Ref Range Status  01/05/2022 4.1 3.5 - 5.2 mmol/L Final         Passed - Na in normal range and within 180 days    Sodium  Date Value Ref Range Status  01/05/2022 141 134 - 144 mmol/L Final         Passed - Last BP in normal range    BP Readings from Last 1 Encounters:  05/01/22 135/70         Passed - Valid encounter within last 6 months    Recent Outpatient Visits           4 months ago Chronic bilateral low back pain without sciatica   [facility] [name], FNP   7 months ago Essential (primary) hypertension   [facility] Tally Joe T, FNP   1 year ago Acute deep vein thrombosis (DVT) of proximal vein of left lower extremity [facility])   [facility] [name], FNP   1 year ago [place] Tally Joe T, FNP   1 year ago Acute midline low back pain without sciatica   [facility], Clearnce Sorrel, [place]       Future Appointments             In 4 months Sharolyn Douglas, Clance Boll, NP [place]. [place]

## 2022-05-08 DIAGNOSIS — M5416 Radiculopathy, lumbar region: Secondary | ICD-10-CM | POA: Diagnosis not present

## 2022-05-10 ENCOUNTER — Other Ambulatory Visit: Payer: Self-pay | Admitting: Family Medicine

## 2022-05-10 DIAGNOSIS — N3281 Overactive bladder: Secondary | ICD-10-CM

## 2022-05-26 ENCOUNTER — Other Ambulatory Visit
Admission: RE | Admit: 2022-05-26 | Discharge: 2022-05-26 | Disposition: A | Discharge status: Home / Self Care | Payer: Medicare PPO | Attending: Nurse Practitioner | Admitting: Nurse Practitioner

## 2022-05-26 DIAGNOSIS — I10 Essential (primary) hypertension: Secondary | ICD-10-CM

## 2022-05-26 DIAGNOSIS — N183 Chronic kidney disease, stage 3 unspecified: Secondary | ICD-10-CM

## 2022-05-26 LAB — BASIC METABOLIC PANEL
Anion gap: 12 (ref 5–15)
BUN: 24 mg/dL — ABNORMAL HIGH (ref 8–23)
CO2: 27 mmol/L (ref 22–32)
Calcium: 9 mg/dL (ref 8.9–10.3)
Chloride: 101 mmol/L (ref 98–111)
Creatinine, Ser: 1.14 mg/dL — ABNORMAL HIGH (ref 0.44–1.00)
GFR, Estimated: 49 mL/min — ABNORMAL LOW (ref >60)
Glucose, Bld: 134 mg/dL — ABNORMAL HIGH (ref 70–99)
Potassium: 3.6 mmol/L (ref 3.5–5.1)
Sodium: 140 mmol/L (ref 135–145)

## 2022-05-27 DIAGNOSIS — M5416 Radiculopathy, lumbar region: Secondary | ICD-10-CM | POA: Diagnosis not present

## 2022-05-27 DIAGNOSIS — M25551 Pain in right hip: Secondary | ICD-10-CM | POA: Diagnosis not present

## 2022-05-27 DIAGNOSIS — M25552 Pain in left hip: Secondary | ICD-10-CM | POA: Diagnosis not present

## 2022-05-29 ENCOUNTER — Encounter: Payer: Self-pay | Admitting: *Deleted

## 2022-06-06 ENCOUNTER — Other Ambulatory Visit: Payer: Self-pay | Admitting: Gastroenterology

## 2022-06-09 DIAGNOSIS — L738 Other specified follicular disorders: Secondary | ICD-10-CM | POA: Diagnosis not present

## 2022-06-09 DIAGNOSIS — L821 Other seborrheic keratosis: Secondary | ICD-10-CM | POA: Diagnosis not present

## 2022-06-09 DIAGNOSIS — Z85828 Personal history of other malignant neoplasm of skin: Secondary | ICD-10-CM | POA: Diagnosis not present

## 2022-06-09 DIAGNOSIS — D485 Neoplasm of uncertain behavior of skin: Secondary | ICD-10-CM | POA: Diagnosis not present

## 2022-06-09 DIAGNOSIS — L720 Epidermal cyst: Secondary | ICD-10-CM | POA: Diagnosis not present

## 2022-06-17 ENCOUNTER — Other Ambulatory Visit: Payer: Medicare PPO

## 2022-06-17 ENCOUNTER — Ambulatory Visit: Payer: Medicare PPO | Admitting: Oncology

## 2022-06-30 ENCOUNTER — Other Ambulatory Visit: Payer: Self-pay | Admitting: Family Medicine

## 2022-06-30 DIAGNOSIS — G588 Other specified mononeuropathies: Secondary | ICD-10-CM

## 2022-07-01 NOTE — Telephone Encounter (Signed)
Requested Prescriptions  Pending Prescriptions Disp Refills   gabapentin (NEURONTIN) 600 MG tablet [Pharmacy Med Name: GABAPENTIN 600 MG TABLET] 180 tablet 0    Sig: TAKE ONE TABLET BY MOUTH TWICE A DAY     Neurology: Anticonvulsants - gabapentin Failed - 06/30/2022  2:26 PM      Failed - Cr in normal range and within 360 days    Creatinine, Ser  Date Value Ref Range Status  05/26/2022 1.14 (H) 0.44 - 1.00 mg/dL Final         Passed - Completed PHQ-2 or PHQ-9 in the last 360 days      Passed - Valid encounter within last 12 months    Recent Outpatient Visits           5 months ago Chronic bilateral low back pain without sciatica   [facility] [name], FNP   9 months ago Essential (primary) hypertension   [place] Tally Joe T, FNP   1 year ago Acute deep vein thrombosis (DVT) of proximal vein of left lower extremity [facility])   [place] [name], FNP   1 year ago [place] Tally Joe T, FNP   2 years ago Acute midline low back pain without sciatica   [facility] [place], Clearnce Sorrel, [place]       Future Appointments             In 3 months Sharolyn Douglas, Clance Boll, NP [place] at [facility]

## 2022-07-07 ENCOUNTER — Other Ambulatory Visit: Payer: Self-pay | Admitting: Family Medicine

## 2022-07-07 DIAGNOSIS — F5104 Psychophysiologic insomnia: Secondary | ICD-10-CM

## 2022-07-07 DIAGNOSIS — E7800 Pure hypercholesterolemia, unspecified: Secondary | ICD-10-CM

## 2022-07-25 ENCOUNTER — Other Ambulatory Visit: Payer: Self-pay | Admitting: Family Medicine

## 2022-07-25 DIAGNOSIS — I10 Essential (primary) hypertension: Secondary | ICD-10-CM

## 2022-07-27 NOTE — Telephone Encounter (Signed)
Requested medication (s) are due for refill today: yes  Requested medication (s) are on the active medication list: yes  Last refill:  05/05/22 and 02/03/22  Future visit scheduled: no  Notes to clinic:  Unable to refill per protocol, courtesy refill already given for Maxzide and Eliquis was last refilled by historical provider, routing for approval.      Requested Prescriptions  Pending Prescriptions Disp Refills   triamterene-hydrochlorothiazide (MAXZIDE) 75-50 MG tablet [Pharmacy Med Name: TRIAMTERENE-HCTZ 75-50 TAB] 30 tablet 0    Sig: TAKE ONE-HALF TABLET BY MOUTH ONE TIME DAILY     Cardiovascular: Diuretic Combos Failed - 07/25/2022  4:34 PM      Failed - Cr in normal range and within 180 days    Creatinine, Ser  Date Value Ref Range Status  05/26/2022 1.14 (H) 0.44 - 1.00 mg/dL Final         Failed - Valid encounter within last 6 months    Recent Outpatient Visits           6 months ago Chronic bilateral low back pain without sciatica   [facility] Jacky Kindle, FNP   9 months ago Essential (primary) hypertension   [place] [facility] Merita Norton T, FNP   1 year ago Acute deep vein thrombosis (DVT) of proximal vein of left lower extremity [facility])   [place] [facility] Merita Norton T, FNP   1 year ago COVID-19   [facility] Merita Norton T, FNP   2 years ago Acute midline low back pain without sciatica   [facility] [place], Alessandra [place], [place]       Future Appointments             In 2 months Brion Aliment, Dois [place], NP [place] [facility] - K in normal range and within 180 days    Potassium  Date Value Ref Range Status  05/26/2022 3.6 3.5 - 5.1 mmol/L Final         Passed - Na in normal range and within 180 days    Sodium  Date Value Ref Range Status  05/26/2022 140 135 - 145 mmol/L Final  01/05/2022  141 134 - 144 mmol/L Final         Passed - Last BP in normal range    BP Readings from Last 1 Encounters:  05/01/22 135/70          ELIQUIS 5 MG TABS tablet [Pharmacy Med Name: ELIQUIS 5 MG TAB[$]] 180 tablet 3    Sig: TAKE ONE TABLET BY MOUTH TWICE A DAY     Hematology:  Anticoagulants - apixaban Failed - 07/25/2022  4:34 PM      Failed - Cr in normal range and within 360 days    Creatinine, Ser  Date Value Ref Range Status  05/26/2022 1.14 (H) 0.44 - 1.00 mg/dL Final         Failed - ALT in normal range and within 360 days    ALT  Date Value Ref Range Status  01/05/2022 37 (H) 0 - 32 IU/L Final         Passed - PLT in normal range and within 360 days    Platelets  Date Value Ref Range Status  02/03/2022 247 150 - 400 K/uL Final  01/05/2022 260 150 - 450 x10E3/uL Final  Passed - HGB in normal range and within 360 days    Hemoglobin  Date Value Ref Range Status  02/03/2022 14.7 12.0 - 15.0 g/dL Final  28/00/3491 79.1 11.1 - 15.9 g/dL Final         Passed - HCT in normal range and within 360 days    HCT  Date Value Ref Range Status  02/03/2022 44.2 36.0 - 46.0 % Final   Hematocrit  Date Value Ref Range Status  01/05/2022 44.3 34.0 - 46.6 % Final         Passed - AST in normal range and within 360 days    AST  Date Value Ref Range Status  01/05/2022 29 0 - 40 IU/L Final         Passed - Valid encounter within last 12 months    Recent Outpatient Visits           6 months ago Chronic bilateral low back pain without sciatica   [facility] Jacky Kindle, FNP   9 months ago Essential (primary) hypertension   [place] [facility] Merita Norton T, FNP   1 year ago Acute deep vein thrombosis (DVT) of proximal vein of left lower extremity [facility])   [place] [facility] Jacky Kindle, FNP   1 year ago COVID-19   [facility] Merita Norton T, FNP   2 years ago Acute  midline low back pain without sciatica    [facility] [place], Alessandra [place], [place]       Future Appointments             In 2 months Brion Aliment, Dois [place], NP [facility]

## 2022-07-28 NOTE — Telephone Encounter (Signed)
Appointment scheduled for Thursday 07/30/2022 at 10:40AM.

## 2022-07-30 ENCOUNTER — Ambulatory Visit: Payer: Medicare PPO | Admitting: Family Medicine

## 2022-07-30 ENCOUNTER — Encounter: Payer: Self-pay | Admitting: Family Medicine

## 2022-07-30 VITALS — BP 132/53 | HR 64 | Temp 98.8°F | Ht 64.0 in | Wt 199.7 lb

## 2022-07-30 DIAGNOSIS — C9110 Chronic lymphocytic leukemia of B-cell type not having achieved remission: Secondary | ICD-10-CM

## 2022-07-30 DIAGNOSIS — E559 Vitamin D deficiency, unspecified: Secondary | ICD-10-CM | POA: Diagnosis not present

## 2022-07-30 DIAGNOSIS — R200 Anesthesia of skin: Secondary | ICD-10-CM

## 2022-07-30 DIAGNOSIS — R7303 Prediabetes: Secondary | ICD-10-CM

## 2022-07-30 DIAGNOSIS — R202 Paresthesia of skin: Secondary | ICD-10-CM | POA: Diagnosis not present

## 2022-07-30 DIAGNOSIS — I10 Essential (primary) hypertension: Secondary | ICD-10-CM

## 2022-07-30 DIAGNOSIS — R252 Cramp and spasm: Secondary | ICD-10-CM

## 2022-07-30 DIAGNOSIS — E7800 Pure hypercholesterolemia, unspecified: Secondary | ICD-10-CM | POA: Diagnosis not present

## 2022-07-30 DIAGNOSIS — N1831 Chronic kidney disease, stage 3a: Secondary | ICD-10-CM

## 2022-07-30 DIAGNOSIS — E034 Atrophy of thyroid (acquired): Secondary | ICD-10-CM | POA: Diagnosis not present

## 2022-07-30 MED ORDER — AMLODIPINE BESYLATE 10 MG PO TABS: 10.0000 mg | ORAL_TABLET | Freq: Every day | ORAL | 0 refills | Status: DC

## 2022-07-30 NOTE — Assessment & Plan Note (Signed)
Chronic, at goal Goal of <139/<79 Repeat CBC and CMP Previously maintained on 5 mg of norvasc; however, given concern for cramping will increase to 10 mg today with plan for 1 month BP f/u Please stop your potassium and maxzide at this time; continue losartan at 100 mg We will monitor further concerns for numbness/tingling and cramping/edema of bilateral lower extremities. Previously f/b cardiology

## 2022-07-30 NOTE — Assessment & Plan Note (Signed)
Acute on chronic, presumed ortho in nature Will recommend nerve work up and plan to adjust blood pressure medications to assist with fluid status Has not been evaluated by neurology for this complaint

## 2022-07-30 NOTE — Assessment & Plan Note (Signed)
Chronic, previously stable Will repeat labs at this time given neurological complaints Previously mained on 112 mcg daily

## 2022-07-30 NOTE — Assessment & Plan Note (Signed)
Chronic, previously elevated at LDL >100 Repeat LP Given age, recommend diet low in saturated fat and regular exercise - 30 min at least 5 times per week Reduced risk with medication combination of clestid 2 g daily as well as zetia 10 mg at bedtime

## 2022-07-30 NOTE — Progress Notes (Signed)
I,J'ya E Hunter,acting as a scribe for Jacky Kindle, FNP.,have documented all relevant documentation on the behalf of Jacky Kindle, FNP,as directed by  Jacky Kindle, FNP while in the presence of Jacky Kindle, FNP.  Established patient visit   [name]   DOB: 1941/04/12   82 y.o. Female  MRN: [id number] Visit Date: 07/30/2022  Today's healthcare provider: Jacky Kindle, FNP  Re Introduced to nurse practitioner role and practice setting.  All questions answered.  Discussed provider/patient relationship and expectations.  Subjective    HPI Back Pain  She reports recurrent back pain. There was not an injury that may have caused the pain. The most recent episode started several months ago and is worsening. The pain is located in the across the lower backto the left foot. It is described as sharp, is 8/10 in intensity, occurring intermittently. Symptoms are worse with position changes.  Aggravating factors: bending backwards, bending forwards, standing, walking, and walking uphill Relieving factors: recumbency.  She has tried acetaminophen, NSAIDs, prescription pain relievers, and physical thearpy with little relief.   Associated symptoms: No abdominal pain No bowel incontinence  No chest pain No dysuria   No fever Yes headaches  Yes joint pains Yes pelvic pain  Yes weakness in leg Left leg  Yes tingling in lower extremities  No urinary incontinence No weight loss     -----------------------------------------------------------------------------------------  ------------------------------------------------------------------------   Medications: Outpatient Medications Prior to Visit  Medication Sig   ALPRAZolam (XANAX) 0.25 MG tablet Take 1 tablet (0.25 mg total) by mouth at bedtime.   carvedilol (COREG) 12.5 MG tablet Take 1 tablet (12.5 mg total) by mouth 2 (two) times daily with a meal.   Cholecalciferol (VITAMIN D3) 1.25 MG (50000 UT) CAPS TAKE ONE CAPSULE  BY MOUTH ONCE A WEEK   colestipol (COLESTID) 1 g tablet Take 1 tablet (1 g total) by mouth 2 (two) times daily.   cyclobenzaprine (FLEXERIL) 10 MG tablet Take 1 tablet (10 mg total) by mouth at bedtime as needed for muscle spasms.   donepezil (ARICEPT) 10 MG tablet Take 1 tablet (10 mg total) by mouth at bedtime.   ELIQUIS 5 MG TABS tablet TAKE ONE TABLET BY MOUTH TWICE A DAY   ezetimibe (ZETIA) 10 MG tablet Take 1 tablet (10 mg total) by mouth at bedtime.   gabapentin (NEURONTIN) 600 MG tablet TAKE ONE TABLET BY MOUTH TWICE A DAY   HYDROcodone-acetaminophen (NORCO/VICODIN) 5-325 MG tablet Take 1 tablet by mouth daily as needed for severe pain.   levothyroxine (SYNTHROID) 112 MCG tablet TAKE ONE TABLET BY MOUTH ONE TIME DAILY BEFORE BREAKFAST   losartan (COZAAR) 100 MG tablet Take 1 tablet (100 mg total) by mouth daily.   Magnesium 250 MG TABS Take 1 tablet by mouth daily.   pantoprazole (PROTONIX) 40 MG tablet TAKE ONE TABLET BY MOUTH EVERY MORNING   zolpidem (AMBIEN) 10 MG tablet Take 1 tablet (10 mg total) by mouth at bedtime.   [DISCONTINUED] amLODipine (NORVASC) 5 MG tablet Take 1 tablet (5 mg total) by mouth daily.   [DISCONTINUED] potassium chloride SA (K-DUR) 20 MEQ tablet TAKE 1 TABLET BY MOUTH TWICE DAILY   [DISCONTINUED] triamterene-hydrochlorothiazide (MAXZIDE) 75-50 MG tablet TAKE ONE-HALF TABLET BY MOUTH ONE TIME DAILY   [DISCONTINUED] solifenacin (VESICARE) 10 MG tablet TAKE ONE TABLET BY MOUTH ONE TIME DAILY (Patient not taking: Reported on 07/30/2022)   [DISCONTINUED] triamcinolone ointment (KENALOG) 0.5 % Apply 1 Application topically 2 (two)  times daily. Apply to R external ear twice daily for max of 14 days. (Patient not taking: Reported on 05/01/2022)   No facility-administered medications prior to visit.    Review of Systems    Objective    BP (!) 132/53 (BP Location: Right Arm, Patient Position: Sitting, Cuff Size: Large)   Pulse 64   Temp 98.8 F (37.1 C) (Oral)    Ht 5\' 4"  (1.626 m)   Wt 199 lb 11.2 oz (90.6 kg)   BMI 34.28 kg/m   Physical Exam Vitals and nursing note reviewed.  Constitutional:      General: She is not in acute distress.    Appearance: Normal appearance. She is obese. She is not ill-appearing, toxic-appearing or diaphoretic.  HENT:     Head: Normocephalic and atraumatic.  Cardiovascular:     Rate and Rhythm: Normal rate and regular rhythm.     Pulses: Normal pulses.     Heart sounds: Normal heart sounds. No murmur heard.    No friction rub. No gallop.  Pulmonary:     Effort: Pulmonary effort is normal. No respiratory distress.     Breath sounds: Normal breath sounds. No stridor. No wheezing, rhonchi or rales.  Chest:     Chest wall: No tenderness.  Abdominal:     General: Bowel sounds are normal.     Palpations: Abdomen is soft.  Musculoskeletal:        General: No swelling, tenderness, deformity or signs of injury. Normal range of motion.     Right lower leg: No edema.     Left lower leg: No edema.  Skin:    General: Skin is warm and dry.     Capillary Refill: Capillary refill takes less than 2 seconds.     Coloration: Skin is not jaundiced or pale.     Findings: No bruising, erythema, lesion or rash.  Neurological:     General: No focal deficit present.     Mental Status: She is alert and oriented to person, place, and time. Mental status is at baseline.     Cranial Nerves: No cranial nerve deficit.     Sensory: Sensory deficit present.     Motor: Weakness present.     Coordination: Coordination normal.     Gait: Gait abnormal.     Comments: +SLR L>R with subsequent cramping in contralateral side when tested; limited rotation of L leg and hip; partial limited rotation of R leg and hip. No focal deficits or edema noted on exam  Psychiatric:        Mood and Affect: Mood normal.        Behavior: Behavior normal.        Thought Content: Thought content normal.        Judgment: Judgment normal.     No results  found for any visits on 07/30/22.  Assessment & Plan     Problem List Items Addressed This Visit       Cardiovascular and Mediastinum   Essential (primary) hypertension    Chronic, at goal Goal of <139/<79 Repeat CBC and CMP Previously maintained on 5 mg of norvasc; however, given concern for cramping will increase to 10 mg today with plan for 1 month BP f/u Please stop your potassium and maxzide at this time; continue losartan at 100 mg We will monitor further concerns for numbness/tingling and cramping/edema of bilateral lower extremities. Previously f/b cardiology      Relevant Medications   amLODipine (NORVASC) 10 MG  tablet   Other Relevant Orders   Comprehensive Metabolic Panel (CMET)   CBC with Differential/Platelet     Endocrine   Hypothyroidism due to acquired atrophy of thyroid    Chronic, previously stable Will repeat labs at this time given neurological complaints Previously mained on 112 mcg daily       Relevant Orders   TSH + free T4     Genitourinary   Stage 3a chronic kidney disease    Chronic, previously medications revised to reflect Continue to emphasize water intake with goal of 48 oz/day or more Repeat CMP      Relevant Orders   Comprehensive Metabolic Panel (CMET)     Other   Avitaminosis D    Chronic, previously low and on prescription supplementation Repeat labs Endorses numbness and tingling; primarily stemming from L hip down and across L leg and back      Relevant Orders   Vitamin D (25 hydroxy)   CLL (chronic lymphocytic leukemia)    Chronic, previously stage 0 followed by Dr Smith Robert Will repeat CBC and CMP at this time in addition to other labs Patient denies concerns for infections or inflammations      Hypercholesterolemia    Chronic, previously elevated at LDL >100 Repeat LP Given age, recommend diet low in saturated fat and regular exercise - 30 min at least 5 times per week Reduced risk with medication combination of clestid  2 g daily as well as zetia 10 mg at bedtime      Relevant Medications   amLODipine (NORVASC) 10 MG tablet   Other Relevant Orders   Lipid panel   Leg cramping    Acte on chronic, worsening in past 6 months Has been working with [facility] to assist with stretching and imaging as well as injections to assist with pain control Patient notes poor improvement with use of previous injection; however, may consider another later this month if exacerbation remains Recommend full electrolytes including use of magnesium and phosphorus       Relevant Orders   Comprehensive Metabolic Panel (CMET)   Magnesium   Phosphorus   Vitamin D (25 hydroxy)   B12 and Folate Panel   Iron, TIBC and Ferritin Panel   Numbness and tingling of left leg - Primary    Acute on chronic, presumed ortho in nature Will recommend nerve work up and plan to adjust blood pressure medications to assist with fluid status Has not been evaluated by neurology for this complaint      Relevant Orders   B12 and Folate Panel   Vitamin B6   Vitamin B1   Prediabetes    Chronic, previously stable with diet and exercise Repeat A1c given concern for possible neurological advancements as well as worsening leg pain Continue to recommend balanced, lower carb meals. Smaller meal size, adding snacks. Choosing water as drink of choice and increasing purposeful exercise.       Relevant Orders   Hemoglobin A1c   Return in about 4 weeks (around 08/27/2022), or if symptoms worsen or fail to improve, for blood pressure check, HTN management.     Leilani Merl, FNP, have reviewed all documentation for this visit. The documentation on 07/30/22 for the exam, diagnosis, procedures, and orders are all accurate and complete.  Jacky Kindle, FNP  [facility] [phone] (phone) [phone] (fax)  [facility]

## 2022-07-30 NOTE — Assessment & Plan Note (Signed)
Chronic, previously stage 0 followed by Dr Smith Robert Will repeat CBC and CMP at this time in addition to other labs Patient denies concerns for infections or inflammations

## 2022-07-30 NOTE — Assessment & Plan Note (Signed)
Chronic, previously medications revised to reflect Continue to emphasize water intake with goal of 48 oz/day or more Repeat CMP

## 2022-07-30 NOTE — Patient Instructions (Addendum)
Increase norvasc from 5 mg to 10 mg; follow up in 1 month for blood pressure  Stop maxzide and potassium at this time.

## 2022-07-30 NOTE — Assessment & Plan Note (Signed)
Chronic, previously stable with diet and exercise Repeat A1c given concern for possible neurological advancements as well as worsening leg pain Continue to recommend balanced, lower carb meals. Smaller meal size, adding snacks. Choosing water as drink of choice and increasing purposeful exercise.

## 2022-07-30 NOTE — Assessment & Plan Note (Signed)
Acte on chronic, worsening in past 6 months Has been working with [facility] to assist with stretching and imaging as well as injections to assist with pain control Patient notes poor improvement with use of previous injection; however, may consider another later this month if exacerbation remains Recommend full electrolytes including use of magnesium and phosphorus

## 2022-07-30 NOTE — Assessment & Plan Note (Signed)
Chronic, previously low and on prescription supplementation Repeat labs Endorses numbness and tingling; primarily stemming from L hip down and across L leg and back

## 2022-07-31 ENCOUNTER — Other Ambulatory Visit: Payer: Self-pay

## 2022-07-31 DIAGNOSIS — M545 Low back pain, unspecified: Secondary | ICD-10-CM

## 2022-07-31 NOTE — Progress Notes (Signed)
Slight increase in LDL/bad cholesterol. I continue to recommend diet low in saturated fat and regular exercise - 30 min at least 5 times per week  A1c is also increased; remains in pre-diabetic range. Continue to recommend balanced, lower carb meals. Smaller meal size, adding snacks. Choosing water as drink of choice and increasing purposeful exercise.  Thyroid is slightly under producing; continue to monitor symptoms. Defer changes at this time to levothyroxine as we made changes to BP regimen (stop maxzide/increase norvasc).   Vit D and other vitamins are stable; no impairment/correlation with cramping patterns.

## 2022-08-03 ENCOUNTER — Ambulatory Visit: Payer: Medicare PPO | Admitting: Oncology

## 2022-08-03 ENCOUNTER — Other Ambulatory Visit: Payer: Medicare PPO

## 2022-08-04 ENCOUNTER — Inpatient Hospital Stay: Payer: Medicare PPO | Attending: Oncology

## 2022-08-04 DIAGNOSIS — C9110 Chronic lymphocytic leukemia of B-cell type not having achieved remission: Secondary | ICD-10-CM

## 2022-08-04 LAB — LIPID PANEL
Chol/HDL Ratio: 3.9 ratio (ref 0.0–4.4)
Cholesterol, Total: 187 mg/dL (ref 100–199)
HDL: 48 mg/dL (ref >39)
LDL Chol Calc (NIH): 112 mg/dL — ABNORMAL HIGH (ref 0–99)
Triglycerides: 155 mg/dL — ABNORMAL HIGH (ref 0–149)
VLDL Cholesterol Cal: 27 mg/dL (ref 5–40)

## 2022-08-04 LAB — B12 AND FOLATE PANEL
Folate: 7.8 ng/mL (ref >3.0)
Vitamin B-12: 607 pg/mL (ref 232–1245)

## 2022-08-04 LAB — CBC WITH DIFFERENTIAL/PLATELET
Abs Immature Granulocytes: 0.05 10*3/uL (ref 0.00–0.07)
Basophils Absolute: 0.1 10*3/uL (ref 0.0–0.2)
Basophils Absolute: 0.1 10*3/uL (ref 0.0–0.1)
Basophils Relative: 1 %
Basos: 0 %
EOS (ABSOLUTE): 0.5 10*3/uL — ABNORMAL HIGH (ref 0.0–0.4)
Eos: 3 %
Eosinophils Absolute: 0.5 10*3/uL (ref 0.0–0.5)
Eosinophils Relative: 3 %
HCT: 42.3 % (ref 36.0–46.0)
Hematocrit: 42.8 % (ref 34.0–46.6)
Hemoglobin: 14.7 g/dL (ref 11.1–15.9)
Hemoglobin: 14.1 g/dL (ref 12.0–15.0)
Immature Grans (Abs): 0 10*3/uL (ref 0.0–0.1)
Immature Granulocytes: 0 %
Immature Granulocytes: 0 %
Lymphocytes Absolute: 10 10*3/uL — ABNORMAL HIGH (ref 0.7–3.1)
Lymphocytes Relative: 62 %
Lymphs Abs: 9.3 10*3/uL — ABNORMAL HIGH (ref 0.7–4.0)
Lymphs: 61 %
MCH: 31.1 pg (ref 26.6–33.0)
MCH: 30.9 pg (ref 26.0–34.0)
MCHC: 34.3 g/dL (ref 31.5–35.7)
MCHC: 33.3 g/dL (ref 30.0–36.0)
MCV: 91 fL (ref 79–97)
MCV: 92.8 fL (ref 80.0–100.0)
Monocytes Absolute: 1.8 10*3/uL — ABNORMAL HIGH (ref 0.1–0.9)
Monocytes Absolute: 1 10*3/uL (ref 0.1–1.0)
Monocytes Relative: 6 %
Monocytes: 10 %
Neutro Abs: 4.2 10*3/uL (ref 1.7–7.7)
Neutrophils Absolute: 4.4 10*3/uL (ref 1.4–7.0)
Neutrophils Relative %: 28 %
Neutrophils: 26 %
Platelets: 300 10*3/uL (ref 150–450)
Platelets: 293 10*3/uL (ref 150–400)
RBC: 4.72 x10E6/uL (ref 3.77–5.28)
RBC: 4.56 MIL/uL (ref 3.87–5.11)
RDW: 13.3 % (ref 11.7–15.4)
RDW: 13.7 % (ref 11.5–15.5)
Smear Review: NORMAL
WBC: 16.8 10*3/uL — ABNORMAL HIGH (ref 3.4–10.8)
WBC: 15 10*3/uL — ABNORMAL HIGH (ref 4.0–10.5)
nRBC: 0 % (ref 0.0–0.2)

## 2022-08-04 LAB — VITAMIN B1: Thiamine: 117.5 nmol/L (ref 66.5–200.0)

## 2022-08-04 LAB — IRON,TIBC AND FERRITIN PANEL
Ferritin: 161 ng/mL — ABNORMAL HIGH (ref 15–150)
Iron Saturation: 21 % (ref 15–55)
Iron: 74 ug/dL (ref 27–139)
Total Iron Binding Capacity: 358 ug/dL (ref 250–450)
UIBC: 284 ug/dL (ref 118–369)

## 2022-08-04 LAB — COMPREHENSIVE METABOLIC PANEL
ALT: 21 IU/L (ref 0–32)
AST: 22 IU/L (ref 0–40)
Albumin/Globulin Ratio: 1.9 (ref 1.2–2.2)
Albumin: 4.4 g/dL (ref 3.8–4.8)
Alkaline Phosphatase: 106 IU/L (ref 44–121)
BUN/Creatinine Ratio: 18 (ref 12–28)
BUN: 19 mg/dL (ref 8–27)
Bilirubin Total: 0.3 mg/dL (ref 0.0–1.2)
CO2: 24 mmol/L (ref 20–29)
Calcium: 9.7 mg/dL (ref 8.7–10.3)
Chloride: 100 mmol/L (ref 96–106)
Creatinine, Ser: 1.05 mg/dL — ABNORMAL HIGH (ref 0.57–1.00)
Globulin, Total: 2.3 g/dL (ref 1.5–4.5)
Glucose: 103 mg/dL — ABNORMAL HIGH (ref 70–99)
Potassium: 4.4 mmol/L (ref 3.5–5.2)
Sodium: 139 mmol/L (ref 134–144)
Total Protein: 6.7 g/dL (ref 6.0–8.5)
eGFR: 54 mL/min/{1.73_m2} — ABNORMAL LOW (ref >59)

## 2022-08-04 LAB — HEMOGLOBIN A1C
Est. average glucose Bld gHb Est-mCnc: 137 mg/dL
Hgb A1c MFr Bld: 6.4 % — ABNORMAL HIGH (ref 4.8–5.6)

## 2022-08-04 LAB — TSH+FREE T4
Free T4: 1.84 ng/dL — ABNORMAL HIGH (ref 0.82–1.77)
TSH: 5.2 u[IU]/mL — ABNORMAL HIGH (ref 0.450–4.500)

## 2022-08-04 LAB — MAGNESIUM: Magnesium: 1.9 mg/dL (ref 1.6–2.3)

## 2022-08-04 LAB — PHOSPHORUS: Phosphorus: 4.3 mg/dL (ref 3.0–4.3)

## 2022-08-04 LAB — FERRITIN: Ferritin: 76 ng/mL (ref 11–307)

## 2022-08-04 LAB — IRON AND TIBC
Iron: 92 ug/dL (ref 28–170)
Saturation Ratios: 26 % (ref 10.4–31.8)
TIBC: 360 ug/dL (ref 250–450)
UIBC: 268 ug/dL

## 2022-08-04 LAB — VITAMIN D 25 HYDROXY (VIT D DEFICIENCY, FRACTURES): Vit D, 25-Hydroxy: 77.1 ng/mL (ref 30.0–100.0)

## 2022-08-04 LAB — VITAMIN B6: Vitamin B6: 5.9 ug/L (ref 3.4–65.2)

## 2022-08-05 ENCOUNTER — Ambulatory Visit (INDEPENDENT_AMBULATORY_CARE_PROVIDER_SITE_OTHER): Payer: Medicare PPO

## 2022-08-05 VITALS — Ht 64.5 in | Wt 199.0 lb

## 2022-08-05 DIAGNOSIS — Z0000 Encounter for general adult medical examination without abnormal findings: Secondary | ICD-10-CM | POA: Diagnosis not present

## 2022-08-05 NOTE — Progress Notes (Signed)
I connected with  Excell Seltzer on 08/05/22 by a audio enabled telemedicine application and verified that I am speaking with the correct person using two identifiers.  Patient Location: Home  Provider Location: Office/Clinic  I discussed the limitations of evaluation and management by telemedicine. The patient expressed understanding and agreed to proceed.  Subjective:   Cathy Barnes is a 82 y.o. female who presents for Medicare Annual (Subsequent) preventive examination.  Review of Systems     Cardiac Risk Factors include: hypertension;advanced age (>26men, >32 women);obesity (BMI >30kg/m2);sedentary lifestyle     Objective:    Today's Vitals   08/05/22 1440  Weight: 199 lb (90.3 kg)  Height: 5' 4.5" (1.638 m)  PainSc: 7    Body mass index is 33.63 kg/m.     08/05/2022    2:52 PM 02/03/2022    1:39 PM 10/15/2021    1:00 PM 08/04/2021    1:39 PM 07/31/2021    3:37 PM 06/16/2021   10:03 AM 01/24/2021   10:38 AM  Advanced Directives  Does Patient Have a Medical Advance Directive? Yes Yes Yes Yes No Yes No  Type of Special educational needs teacher of [place];Living will  Healthcare Power of [place];Living will  Healthcare Power of [place];Living will   Does patient want to make changes to medical advance directive?   No - Patient declined No - Patient declined     Would patient like information on creating a medical advance directive?     No - Patient declined  No - Patient declined    Current Medications (verified) Outpatient Encounter Medications as of 08/05/2022  Medication Sig   ALPRAZolam (XANAX) 0.25 MG tablet Take 1 tablet (0.25 mg total) by mouth at bedtime.   amLODipine (NORVASC) 10 MG tablet Take 1 tablet (10 mg total) by mouth daily.   carvedilol (COREG) 12.5 MG tablet Take 1 tablet (12.5 mg total) by mouth 2 (two) times daily with a meal.   Cholecalciferol (VITAMIN D3) 1.25 MG (50000 UT) CAPS TAKE ONE CAPSULE BY MOUTH ONCE A WEEK   colestipol (COLESTID) 1  g tablet Take 1 tablet (1 g total) by mouth 2 (two) times daily.   cyclobenzaprine (FLEXERIL) 10 MG tablet Take 1 tablet (10 mg total) by mouth at bedtime as needed for muscle spasms.   donepezil (ARICEPT) 10 MG tablet Take 1 tablet (10 mg total) by mouth at bedtime.   ezetimibe (ZETIA) 10 MG tablet Take 1 tablet (10 mg total) by mouth at bedtime.   gabapentin (NEURONTIN) 600 MG tablet TAKE ONE TABLET BY MOUTH TWICE A DAY   HYDROcodone-acetaminophen (NORCO/VICODIN) 5-325 MG tablet Take 1 tablet by mouth daily as needed for severe pain.   levothyroxine (SYNTHROID) 112 MCG tablet TAKE ONE TABLET BY MOUTH ONE TIME DAILY BEFORE BREAKFAST   losartan (COZAAR) 100 MG tablet Take 1 tablet (100 mg total) by mouth daily.   Magnesium 250 MG TABS Take 1 tablet by mouth daily.   pantoprazole (PROTONIX) 40 MG tablet TAKE ONE TABLET BY MOUTH EVERY MORNING   zolpidem (AMBIEN) 10 MG tablet Take 1 tablet (10 mg total) by mouth at bedtime.   ELIQUIS 5 MG TABS tablet TAKE ONE TABLET BY MOUTH TWICE A DAY (Patient not taking: Reported on 08/05/2022)   No facility-administered encounter medications on file as of 08/05/2022.    Allergies (verified) Shellfish-derived products, Shellfish allergy, and Latex   History: Past Medical History:  Diagnosis Date   Allergy    some  Cataract    bilaterally both eyes    Chest pain    a. 07/2019 MV: EF 77%, no ischemia/infarct.   CKD (chronic kidney disease), stage III    CLL (chronic lymphocytic leukemia)    stage 0   Diastolic dysfunction    a. 08/2019 Echo: EF 55-60%, no rwma, Gr1 DD, nl RV size/fxn.   Gallstones    GERD (gastroesophageal reflux disease)    H/O blood clots    Hydrocephalus    [facility]    Hyperglycemia    Hyperlipidemia    Hypertension    a. 03/2022 Renal Duplex: No RAS.   Lymphocytosis    monoclonal b cell   Migraine, unspecified, without mention of intractable migraine without mention of status migrainosus 12/14/2012   Osteoarthritis,  chronic    Osteoporosis    Peptic ulcer    some bleeding with ulcers as well    Past Surgical History:  Procedure Laterality Date   ABDOMINAL HYSTERECTOMY     BREAST CYST ASPIRATION Left 1993   Removed   breast cyst removed Right    benign    BREAST LUMPECTOMY Bilateral    COLONOSCOPY     ERCP N/A 02/04/2018   Procedure: ENDOSCOPIC RETROGRADE CHOLANGIOPANCREATOGRAPHY (ERCP);  Surgeon: Midge Minium, MD;  Location: [facility] ENDOSCOPY;  Service: Endoscopy;  Laterality: N/A;   ERCP N/A 02/28/2018   Procedure: ENDOSCOPIC RETROGRADE CHOLANGIOPANCREATOGRAPHY (ERCP) STENT REMOVAL;  Surgeon: Midge Minium, MD;  Location: [facility] ENDOSCOPY;  Service: Endoscopy;  Laterality: N/A;   EYE SURGERY     growth removal Left 2019   L wrist growth removed, abnormal cells   History of Cataract surgery Right 2011   left 2011   PARTIAL HYSTERECTOMY     POLYPECTOMY     ROBOTIC ASSISTED LAPAROSCOPIC CHOLECYSTECTOMY-MULTI SITE N/A 03/08/2018   Procedure: ROBOTIC ASSISTED LAPAROSCOPIC CHOLECYSTECTOMY-MULTI SITE;  Surgeon: Leafy Ro, MD;  Location: [facility] ORS;  Service: General;  Laterality: N/A;   S/P ACL repair Left    knee   TONSILLECTOMY     TUBAL LIGATION  1975   Bilateral   Family History  Problem Relation Age of Onset   Colon cancer Mother    Hypertension Mother    Colon cancer Father    Heart disease Father    Hypertension Father    Colon polyps Neg Hx    Rectal cancer Neg Hx    Stomach cancer Neg Hx    Esophageal cancer Neg Hx    Social History   Socioeconomic History   Marital status: Divorced    Spouse name: Not on file   Number of children: 1   Years of education: college   Highest education level: Not on file  Occupational History   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 Divorced.      Social Determinants of Health   Financial Resource Strain: Low  Risk  (08/05/2022)   Overall Financial Resource Strain (CARDIA)    Difficulty of Paying Living Expenses: Not hard at all  Food Insecurity: No Food Insecurity (08/05/2022)   Hunger Vital Sign    Worried About Running Out of Food in the Last Year: Never true    Ran Out of Food in the Last Year: Never true  Transportation Needs: No Transportation Needs (08/05/2022)   PRAPARE - Administrator, Civil Service (Medical): No    Lack of Transportation (Non-Medical): No  Physical Activity: Inactive (08/05/2022)   Exercise Vital Sign    Days of Exercise per Week: 0 days    Minutes of Exercise per Session: 0 min  Stress: No Stress Concern Present (08/05/2022)   [employer] of Occupational Health - Occupational Stress Questionnaire    Feeling of Stress : Not at all  Social Connections: Socially Isolated (08/05/2022)   Social Connection and Isolation Panel [NHANES]    Frequency of Communication with Friends and Family: More than three times a week    Frequency of Social Gatherings with Friends and Family: Three times a week    Attends Religious Services: Never    Active Member of Clubs or Organizations: No    Attends Banker Meetings: Never    Marital Status: Widowed    Tobacco Counseling Counseling given: Not Answered   Clinical Intake:  Pre-visit preparation completed: Yes  Pain : 0-10 Pain Score: 7  Pain Type: Acute pain Pain Location: Leg Pain Orientation: Left Pain Descriptors / Indicators: Aching Pain Onset: 1 to 4 weeks ago Pain Frequency: Intermittent Pain Relieving Factors: heating  pad and off of leg  Pain Relieving Factors: heating pad and off of leg  BMI - recorded: 33.63 Nutritional Status: BMI > 30  Obese Nutritional Risks: None Diabetes: No  How often do you need to have someone help you when you read instructions, pamphlets, or other written materials from your doctor or pharmacy?: 1 - Never  Diabetic?no  Interpreter Needed?: No  Comments: daughter and grandson lives with her Information entered by :: B.Vincie Linn,LPN   Activities of Daily Living    08/05/2022    2:52 PM 07/30/2022   10:55 AM  In your present state of health, do you have any difficulty performing the following activities:  Hearing? 0 0  Vision? 0 0  Difficulty concentrating or making decisions? 0 0  Walking or climbing stairs? 1 1  Dressing or bathing? 0 0  Doing errands, shopping? 0 0  Preparing Food and eating ? N   Using the Toilet? N   In the past six months, have you accidently leaked urine? N   Do you have problems with loss of bowel control? N   Managing your Medications? N   Managing your Finances? N   Housekeeping or managing your Housekeeping? N     Patient Care Team: Jacky Kindle, FNP as PCP - General (Family Medicine) Iran Ouch, MD as PCP - Cardiology (Cardiology) Mateo Flow, MD as Consulting Physician (Ophthalmology) Anson Fret, MD as Consulting Physician (Neurology) Creig Hines, MD as Consulting Physician (Oncology)  Indicate any recent Medical Services you may have received from other than [facility] providers in the past year (date may be approximate).     Assessment:   This is a routine wellness examination for [facility].  Hearing/Vision screen Hearing Screening - Comments:: Adequate hearing Vision Screening - Comments:: Adequate vision w/glasses Dr Argentina Donovan  Dietary issues and exercise activities  discussed: Current Exercise Habits: The patient does not participate in regular exercise at present, Exercise limited by: orthopedic  condition(s)   Goals Addressed             This Visit's Progress    DIET - EAT MORE FRUITS AND VEGETABLES   On track    Increase water intake   On track    Recommend increasing water intake to 3 glasses a day.       Depression Screen    08/05/2022    2:48 PM 07/30/2022   10:55 AM 01/05/2022    2:04 PM 10/03/2021   10:06 AM 07/31/2021    3:35 PM 07/25/2020    3:59 PM 06/26/2020    3:10 PM  PHQ 2/9 Scores  PHQ - 2 Score 0 1 1 1  0 0 1  PHQ- 9 Score 0 3 3 3  1 3     Fall Risk    08/05/2022    2:44 PM 07/30/2022   10:55 AM 01/05/2022    2:04 PM 10/03/2021   10:06 AM 07/31/2021    3:37 PM  Fall Risk   Falls in the past year? 0 0 0 0 0  Number falls in past yr: 0 0 0 0 0  Injury with Fall? 0 0 0 0 0  Risk for fall due to : No Fall Risks No Fall Risks No Fall Risks  No Fall Risks  Follow up Education provided;Falls prevention discussed  Falls evaluation completed  Falls evaluation completed    FALL RISK PREVENTION PERTAINING TO THE HOME:  Any stairs in or around the home? Yes  If so, are there any without handrails? Yes  Home free of loose throw rugs in walkways, pet beds, electrical cords, etc? Yes  Adequate lighting in your home to reduce risk of falls? Yes   ASSISTIVE DEVICES UTILIZED TO PREVENT FALLS:  Life alert? No  Use of a cane, walker or w/c? No  Grab bars in the bathroom? Yes  Shower chair or bench in shower? Yes  Elevated toilet seat or a handicapped toilet? Yes    Cognitive Function:    09/18/2020    8:37 AM 07/05/2017    1:37 PM 05/18/2016    1:52 PM 11/14/2015    4:02 PM  MMSE - Mini Mental State Exam  Orientation to time 5 5 5 5   Orientation to Place 4 5 5 5   Registration 3 3 3 3   Attention/ Calculation 5 5 5 5   Recall 3 3 3 3   Language- name 2 objects 2 2 2 2   Language- repeat 1 1 1 1   Language- follow 3 step command 3 3 3 3   Language- read & follow direction 1 1 1 1   Write a sentence 1 1 1 1   Copy design 1 1 1 1   Total score 29 30 30 30        02/07/2015    9:50 AM  Montreal Cognitive Assessment   Visuospatial/ Executive (0/5) 3  Naming (0/3) 2  Attention: Read list of digits (0/2) 2  Attention: Read list of letters (0/1) 1  Attention: Serial 7 subtraction starting at 100 (0/3) 1  Language: Repeat phrase (0/2) 0  Language : Fluency (0/1) 1  Abstraction (0/2) 2  Delayed Recall (0/5) 5  Orientation (0/6) 6  Total 23  Adjusted Score (based on education) 23      08/05/2022    2:56 PM 07/02/2016    1:50 PM  6CIT Screen  What  Year? 0 points 0 points  What month? 0 points 0 points  What time? 0 points 0 points  Count back from 20 0 points 0 points  Months in reverse 0 points 0 points  Repeat phrase 0 points 0 points  Total Score 0 points 0 points    Immunizations Immunization History  Administered Date(s) Administered   Fluad Quad(high Dose 65+) 01/28/2021, 01/05/2022   Influenza Split 01/09/2010, 12/24/2011   Influenza, High Dose Seasonal PF 01/07/2014, 01/03/2018, 12/13/2018   Influenza-Unspecified 02/01/2017   PFIZER(Purple Top)SARS-COV-2 Vaccination 05/10/2019, 06/03/2019, 03/04/2020   Pneumococcal Conjugate-13 06/25/2014   Pneumococcal Polysaccharide-23 05/04/2011   Td 04/18/2004   Tdap 11/09/2012    TDAP status: Up to date  Flu Vaccine status: Up to date  Pneumococcal vaccine status: Up to date  Covid-19 vaccine status: Completed vaccines  Qualifies for Shingles Vaccine? Yes   Zostavax completed No   Shingrix Completed?: No.    Education has been provided regarding the importance of this vaccine. Patient has been advised to call insurance company to determine out of pocket expense if they have not yet received this vaccine. Advised may also receive vaccine at local pharmacy or Health Dept. Verbalized acceptance and understanding.  Screening Tests Health Maintenance  Topic Date Due   Zoster Vaccines- Shingrix (1 of 2) Never done   MAMMOGRAM  04/08/2019   DEXA SCAN  05/01/2019   COVID-19 Vaccine (4  - 2023-24 season) 12/19/2021   DTaP/Tdap/Td (3 - Td or Tdap) 11/10/2022   INFLUENZA VACCINE  11/19/2022   Medicare Annual Wellness (AWV)  08/05/2023   COLONOSCOPY (Pts 45-83yrs Insurance coverage will need to be confirmed)  09/10/2024   Pneumonia Vaccine 29+ Years old  Completed   HPV VACCINES  Aged Out    Health Maintenance  Health Maintenance Due  Topic Date Due   Zoster Vaccines- Shingrix (1 of 2) Never done   MAMMOGRAM  04/08/2019   DEXA SCAN  05/01/2019   COVID-19 Vaccine (4 - 2023-24 season) 12/19/2021    Colorectal cancer screening: No longer required.   Mammogram status: No longer required due to age.  Bone Density status: Completed yes. Results reflect: Bone density results: NORMAL. Repeat every 5 years.  Lung Cancer Screening: (Low Dose CT Chest recommended if Age 52-80 years, 30 pack-year currently smoking OR have quit w/in 15years.) does not qualify.   Lung Cancer Screening Referral: no  Additional Screening:  Hepatitis C Screening: does not qualify; Completed yes  Vision Screening: Recommended annual ophthalmology exams for early detection of glaucoma and other disorders of the eye. Is the patient up to date with their annual eye exam?  Yes  Who is the provider or what is the name of the office in which the patient attends annual eye exams? Dr Lovie Macadamia If pt is not established with a provider, would they like to be referred to a provider to establish care? No .   Dental Screening: Recommended annual dental exams for proper oral hygiene  Community Resource Referral / Chronic Care Management: CRR required this visit?  No   CCM required this visit?  No      Plan:     I have personally reviewed and noted the following in the patient's chart:   Medical and social history Use of alcohol, tobacco or illicit drugs  Current medications and supplements including opioid prescriptions. Patient is currently taking opioid prescriptions. Information provided to  patient regarding non-opioid alternatives. Patient advised to discuss non-opioid treatment plan with their provider.  Functional ability and status Nutritional status Physical activity Advanced directives List of other physicians Hospitalizations, surgeries, and ER visits in previous 12 months Vitals Screenings to include cognitive, depression, and falls Referrals and appointments  In addition, I have reviewed and discussed with patient certain preventive protocols, quality metrics, and best practice recommendations. A written personalized care plan for preventive services as well as general preventive health recommendations were provided to patient.     Sue Lush, LPN   1/61/0960   Nurse Notes: pt relays she is still experiencing left leg pain (seen last week). She relays she was to follow up in one month. *appt made for one month to f/u leg pain.

## 2022-08-05 NOTE — Patient Instructions (Signed)
Cathy Barnes , Thank you for taking time to come for your Medicare Wellness Visit. I appreciate your ongoing commitment to your health goals. Please review the following plan we discussed and let me know if I can assist you in the future.   These are the goals we discussed:  Goals      DIET - EAT MORE FRUITS AND VEGETABLES     Increase water intake     Recommend increasing water intake to 3 glasses a day.        This is a list of the screening recommended for you and due dates:  Health Maintenance  Topic Date Due   Zoster (Shingles) Vaccine (1 of 2) Never done   Mammogram  04/08/2019   DEXA scan (bone density measurement)  05/01/2019   COVID-19 Vaccine (4 - 2023-24 season) 12/19/2021   DTaP/Tdap/Td vaccine (3 - Td or Tdap) 11/10/2022   Flu Shot  11/19/2022   Medicare Annual Wellness Visit  08/05/2023   Colon Cancer Screening  09/10/2024   Pneumonia Vaccine  Completed   HPV Vaccine  Aged Out    Advanced directives: yes  Conditions/risks identified: low falls risk  Next appointment: Follow up in one year for your annual wellness visit 08/10/2023 :30pm telephone   Preventive Care 65 Years and Older, Female Preventive care refers to lifestyle choices and visits with your health care provider that can promote health and wellness. What does preventive care include? A yearly physical exam. This is also called an annual well check. Dental exams once or twice a year. Routine eye exams. Ask your health care provider how often you should have your eyes checked. Personal lifestyle choices, including: Daily care of your teeth and gums. Regular physical activity. Eating a healthy diet. Avoiding tobacco and drug use. Limiting alcohol use. Practicing safe sex. Taking low-dose aspirin every day. Taking vitamin and mineral supplements as recommended by your health care provider. What happens during an annual well check? The services and screenings done by your health care provider  during your annual well check will depend on your age, overall health, lifestyle risk factors, and family history of disease. Counseling  Your health care provider may ask you questions about your: Alcohol use. Tobacco use. Drug use. Emotional well-being. Home and relationship well-being. Sexual activity. Eating habits. History of falls. Memory and ability to understand (cognition). Work and work Astronomer. Reproductive health. Screening  You may have the following tests or measurements: Height, weight, and BMI. Blood pressure. Lipid and cholesterol levels. These may be checked every 5 years, or more frequently if you are over 16 years old. Skin check. Lung cancer screening. You may have this screening every year starting at age 19 if you have a 30-pack-year history of smoking and currently smoke or have quit within the past 15 years. Fecal occult blood test (FOBT) of the stool. You may have this test every year starting at age 37. Flexible sigmoidoscopy or colonoscopy. You may have a sigmoidoscopy every 5 years or a colonoscopy every 10 years starting at age 2. Hepatitis C blood test. Hepatitis B blood test. Sexually transmitted disease (STD) testing. Diabetes screening. This is done by checking your blood sugar (glucose) after you have not eaten for a while (fasting). You may have this done every 1-3 years. Bone density scan. This is done to screen for osteoporosis. You may have this done starting at age 53. Mammogram. This may be done every 1-2 years. Talk to your health care provider about  how often you should have regular mammograms. Talk with your health care provider about your test results, treatment options, and if necessary, the need for more tests. Vaccines  Your health care provider may recommend certain vaccines, such as: Influenza vaccine. This is recommended every year. Tetanus, diphtheria, and acellular pertussis (Tdap, Td) vaccine. You may need a Td booster every  10 years. Zoster vaccine. You may need this after age 39. Pneumococcal 13-valent conjugate (PCV13) vaccine. One dose is recommended after age 102. Pneumococcal polysaccharide (PPSV23) vaccine. One dose is recommended after age 81. Talk to your health care provider about which screenings and vaccines you need and how often you need them. This information is not intended to replace advice given to you by your health care provider. Make sure you discuss any questions you have with your health care provider. Document Released: 05/03/2015 Document Revised: 12/25/2015 Document Reviewed: 02/05/2015 Elsevier Interactive Patient Education  2017 [employer] Prevention in the Home Falls can cause injuries. They can happen to people of all ages. There are many things you can do to make your home safe and to help prevent falls. What can I do on the outside of my home? Regularly fix the edges of walkways and driveways and fix any cracks. Remove anything that might make you trip as you walk through a door, such as a raised step or threshold. Trim any bushes or trees on the path to your home. Use bright outdoor lighting. Clear any walking paths of anything that might make someone trip, such as rocks or tools. Regularly check to see if handrails are loose or broken. Make sure that both sides of any steps have handrails. Any raised decks and porches should have guardrails on the edges. Have any leaves, snow, or ice cleared regularly. Use sand or salt on walking paths during winter. Clean up any spills in your garage right away. This includes oil or grease spills. What can I do in the bathroom? Use night lights. Install grab bars by the toilet and in the tub and shower. Do not use towel bars as grab bars. Use non-skid mats or decals in the tub or shower. If you need to sit down in the shower, use a plastic, non-slip stool. Keep the floor dry. Clean up any water that spills on the floor as soon as it  happens. Remove soap buildup in the tub or shower regularly. Attach bath mats securely with double-sided non-slip rug tape. Do not have throw rugs and other things on the floor that can make you trip. What can I do in the bedroom? Use night lights. Make sure that you have a light by your bed that is easy to reach. Do not use any sheets or blankets that are too big for your bed. They should not hang down onto the floor. Have a firm chair that has side arms. You can use this for support while you get dressed. Do not have throw rugs and other things on the floor that can make you trip. What can I do in the kitchen? Clean up any spills right away. Avoid walking on wet floors. Keep items that you use a lot in easy-to-reach places. If you need to reach something above you, use a strong step stool that has a grab bar. Keep electrical cords out of the way. Do not use floor polish or wax that makes floors slippery. If you must use wax, use non-skid floor wax. Do not have throw rugs and other  things on the floor that can make you trip. What can I do with my stairs? Do not leave any items on the stairs. Make sure that there are handrails on both sides of the stairs and use them. Fix handrails that are broken or loose. Make sure that handrails are as long as the stairways. Check any carpeting to make sure that it is firmly attached to the stairs. Fix any carpet that is loose or worn. Avoid having throw rugs at the top or bottom of the stairs. If you do have throw rugs, attach them to the floor with carpet tape. Make sure that you have a light switch at the top of the stairs and the bottom of the stairs. If you do not have them, ask someone to add them for you. What else can I do to help prevent falls? Wear shoes that: Do not have high heels. Have rubber bottoms. Are comfortable and fit you well. Are closed at the toe. Do not wear sandals. If you use a stepladder: Make sure that it is fully opened.  Do not climb a closed stepladder. Make sure that both sides of the stepladder are locked into place. Ask someone to hold it for you, if possible. Clearly mark and make sure that you can see: Any grab bars or handrails. First and last steps. Where the edge of each step is. Use tools that help you move around (mobility aids) if they are needed. These include: Canes. Walkers. Scooters. Crutches. Turn on the lights when you go into a dark area. Replace any light bulbs as soon as they burn out. Set up your furniture so you have a clear path. Avoid moving your furniture around. If any of your floors are uneven, fix them. If there are any pets around you, be aware of where they are. Review your medicines with your doctor. Some medicines can make you feel dizzy. This can increase your chance of falling. Ask your doctor what other things that you can do to help prevent falls. This information is not intended to replace advice given to you by your health care provider. Make sure you discuss any questions you have with your health care provider. Document Released: 01/31/2009 Document Revised: 09/12/2015 Document Reviewed: 05/11/2014 Elsevier Interactive Patient Education  2017 [employer].

## 2022-08-17 ENCOUNTER — Telehealth: Payer: Self-pay | Admitting: Cardiovascular Disease

## 2022-08-17 DIAGNOSIS — I10 Essential (primary) hypertension: Secondary | ICD-10-CM

## 2022-08-17 NOTE — Telephone Encounter (Signed)
Pt c/o swelling: STAT is pt has developed SOB within 24 hours  How much weight have you gained and in what time span? 2lbs in about 1.5 wk  If swelling, where is the swelling located? Legs, feet, ankles  Are you currently taking a fluid pill? She was, but PCP took pt off of the medication 1.5 wk ago. PCP states she called someone, pt is unsure of who, and that doctor approved of pt coming off of it. Diuretic was stopped, but amlodipine was increased to 10mg .   Are you currently SOB? No, not at all  Do you have a log of your daily weights (if so, list)?   Have you gained 3 pounds in a day or 5 pounds in a week?   Have you traveled recently? No

## 2022-08-19 NOTE — Telephone Encounter (Signed)
Pt calling for an update  Lenor Derrick, RN  Antonieta Iba, MDYesterday (9:24 AM)    Patient called stating that she is having some bilateral lower extremity and pedal edema after increase in amlodipine and discontinuation of diuretic. Patient would like to be prescribed diuretic again of triamterene-hydrochlorothiazide (MAXZIDE) 75-50 MG tablet 0.5 tablet Daily. Advise please and thank you.

## 2022-08-19 NOTE — Telephone Encounter (Signed)
Patient called again to follow-up on next steps regarding swelling in her feet and legs.

## 2022-08-20 NOTE — Telephone Encounter (Signed)
Call placed to the patient. Apologized to her for the delay in a return phone call as the message was sent to the wrong provider.   The patient's PCP had discontinued the Maxzide and increased the Amlodipine to 10 mg once daily on 4/11. Since that the time, the patient has been having increased bilateral, lower extremity swelling. The swelling does get better somewhat overnight. She elevates her legs when she can and does watch her sodium intake.  Current medications: Amlodipine 10 mg once daily Carvedilol 12.5 mg twice daily Losartan 100 mg once daily (half in the morning and half at night)  Blood pressure while on the phone was 169/104 88. She stated that it normally runs lower. Blood pressure at her office visit with PCP was 132/53. She stated that her blood pressure has increased since the swelling has increased.   She has not followed up with PCP since cardiology normally monitors her blood pressure and medications.

## 2022-08-20 NOTE — Telephone Encounter (Signed)
Patient is calling to follow up. Please advise.

## 2022-08-21 ENCOUNTER — Telehealth: Payer: Self-pay | Admitting: Family Medicine

## 2022-08-21 MED ORDER — HYDROCHLOROTHIAZIDE 12.5 MG PO CAPS: 12.5000 mg | ORAL_CAPSULE | Freq: Every day | ORAL | 3 refills | Status: DC

## 2022-08-21 MED ORDER — AMLODIPINE BESYLATE 5 MG PO TABS: 5.0000 mg | ORAL_TABLET | Freq: Every day | ORAL | 0 refills | Status: DC

## 2022-08-21 NOTE — Telephone Encounter (Signed)
Patient called saying she was taken off triamterene-hydrochlorothiazide (MAXZIDE) 75-50 MG tablet and her amLODipine (NORVASC) 10 MG tablet was decreased to 5mg  by Merita Norton. Patient now has a lot of swelling to the point that she can barely walk. She has been advised to to decrease the Amlodipine to 5mg  and add the Maxzide back to her med list but she doesn't have anymore. Patient is now confused and wants to know what she should be doing to get the swelling down. Please reference notes from Cardiologist on 4/29 and f/u with patient.

## 2022-08-21 NOTE — Telephone Encounter (Signed)
Patient has been made aware to decrease the Amlodipine to 5 mg once daily and Hydrochlorothiazide 12.5 mg once daily has been sent in for her.   Lab work has been ordered.  She will keep the office updated on the swelling.

## 2022-08-21 NOTE — Telephone Encounter (Signed)
Decreased amlodipine back to 5 mg once daily.  Add small dose hydrochlorothiazide 12.5 mg once daily.  Check basic metabolic profile in 1 week.

## 2022-08-31 ENCOUNTER — Encounter: Payer: Self-pay | Admitting: Family Medicine

## 2022-08-31 ENCOUNTER — Ambulatory Visit: Payer: Medicare PPO | Admitting: Family Medicine

## 2022-08-31 VITALS — BP 128/60 | HR 72 | Ht 64.0 in | Wt 200.0 lb

## 2022-08-31 DIAGNOSIS — G8929 Other chronic pain: Secondary | ICD-10-CM | POA: Diagnosis not present

## 2022-08-31 DIAGNOSIS — M545 Low back pain, unspecified: Secondary | ICD-10-CM | POA: Diagnosis not present

## 2022-08-31 DIAGNOSIS — L03119 Cellulitis of unspecified part of limb: Secondary | ICD-10-CM | POA: Diagnosis not present

## 2022-08-31 DIAGNOSIS — R600 Localized edema: Secondary | ICD-10-CM | POA: Diagnosis not present

## 2022-08-31 MED ORDER — HYDROCODONE-ACETAMINOPHEN 5-325 MG PO TABS: 1.0000 | ORAL_TABLET | Freq: Every day | ORAL | 0 refills | Status: DC | PRN

## 2022-08-31 MED ORDER — DOXYCYCLINE HYCLATE 100 MG PO TABS: 100.0000 mg | ORAL_TABLET | Freq: Two times a day (BID) | ORAL | 0 refills | Status: DC

## 2022-08-31 NOTE — Assessment & Plan Note (Signed)
Acute, with associated edema; not improved with use of topicals or increased water intake Recommend doxy to assist BID dosing x 10 days Continue to monitor hydration, edema, and redness

## 2022-08-31 NOTE — Assessment & Plan Note (Signed)
Chronic, improved in mornings; worse in afternoons Will check BMP today given ongoing BP medication titration under Dr Kirke Corin BP remains stable today <130/<80 On HCTZ 12.5 once and Norvasc 5 mg, down from 10 mg Also on coreg 12.5 mg BID and cozaar 100 mg

## 2022-08-31 NOTE — Assessment & Plan Note (Signed)
Chronic, ongoing flare since d/c of NSAIDs; will restart norco to assist with ongoing pain s/p previous injection at [facility]; previously tolerated the medication last Fall

## 2022-08-31 NOTE — Progress Notes (Signed)
Established patient visit  [name]   DOB: Nov 10, 1940   82 y.o. Female  MRN: [id number] Visit Date: 08/31/2022  Today's healthcare provider: Jacky Kindle, FNP  Re Introduced to nurse practitioner role and practice setting.  All questions answered.  Discussed provider/patient relationship and expectations.  Subjective    HPI HPI   Pt since--since the change of the b/p and diuretic meds--both legs/feet-swollen, painful, purple discoloration--1 month Last edited by Shelly Bombard, CMA on 08/31/2022  1:35 PM.      Medications: Outpatient Medications Prior to Visit  Medication Sig   ALPRAZolam (XANAX) 0.25 MG tablet Take 1 tablet (0.25 mg total) by mouth at bedtime.   amLODipine (NORVASC) 5 MG tablet Take 1 tablet (5 mg total) by mouth daily.   carvedilol (COREG) 12.5 MG tablet Take 1 tablet (12.5 mg total) by mouth 2 (two) times daily with a meal.   Cholecalciferol (VITAMIN D3) 1.25 MG (50000 UT) CAPS TAKE ONE CAPSULE BY MOUTH ONCE A WEEK   colestipol (COLESTID) 1 g tablet Take 1 tablet (1 g total) by mouth 2 (two) times daily.   cyclobenzaprine (FLEXERIL) 10 MG tablet Take 1 tablet (10 mg total) by mouth at bedtime as needed for muscle spasms.   donepezil (ARICEPT) 10 MG tablet Take 1 tablet (10 mg total) by mouth at bedtime.   ezetimibe (ZETIA) 10 MG tablet Take 1 tablet (10 mg total) by mouth at bedtime.   gabapentin (NEURONTIN) 600 MG tablet TAKE ONE TABLET BY MOUTH TWICE A DAY   hydrochlorothiazide (MICROZIDE) 12.5 MG capsule Take 1 capsule (12.5 mg total) by mouth daily.   levothyroxine (SYNTHROID) 112 MCG tablet TAKE ONE TABLET BY MOUTH ONE TIME DAILY BEFORE BREAKFAST   losartan (COZAAR) 100 MG tablet Take 1 tablet (100 mg total) by mouth daily.   Magnesium 250 MG TABS Take 1 tablet by mouth daily.   pantoprazole (PROTONIX) 40 MG tablet TAKE ONE TABLET BY MOUTH EVERY MORNING   zolpidem (AMBIEN) 10 MG tablet Take 1 tablet (10 mg total) by mouth at bedtime.    [DISCONTINUED] HYDROcodone-acetaminophen (NORCO/VICODIN) 5-325 MG tablet Take 1 tablet by mouth daily as needed for severe pain.   [DISCONTINUED] ELIQUIS 5 MG TABS tablet TAKE ONE TABLET BY MOUTH TWICE A DAY (Patient not taking: Reported on 08/05/2022)   No facility-administered medications prior to visit.   Review of Systems  Last CBC Lab Results  Component Value Date   WBC 15.0 (H) 08/04/2022   HGB 14.1 08/04/2022   HCT 42.3 08/04/2022   MCV 92.8 08/04/2022   MCH 30.9 08/04/2022   RDW 13.7 08/04/2022   PLT 293 08/04/2022   Last metabolic panel Lab Results  Component Value Date   GLUCOSE 103 (H) 07/30/2022   NA 139 07/30/2022   K 4.4 07/30/2022   CL 100 07/30/2022   CO2 24 07/30/2022   BUN 19 07/30/2022   CREATININE 1.05 (H) 07/30/2022   EGFR 54 (L) 07/30/2022   CALCIUM 9.7 07/30/2022   PHOS 4.3 07/30/2022   PROT 6.7 07/30/2022   ALBUMIN 4.4 07/30/2022   LABGLOB 2.3 07/30/2022   AGRATIO 1.9 07/30/2022   BILITOT 0.3 07/30/2022   ALKPHOS 106 07/30/2022   AST 22 07/30/2022   ALT 21 07/30/2022   ANIONGAP 12 05/26/2022   Last lipids Lab Results  Component Value Date   CHOL 187 07/30/2022   HDL 48 07/30/2022   LDLCALC 112 (H) 07/30/2022   TRIG 155 (H) 07/30/2022  CHOLHDL 3.9 07/30/2022   Last hemoglobin A1c Lab Results  Component Value Date   HGBA1C 6.4 (H) 07/30/2022   Last thyroid functions Lab Results  Component Value Date   TSH 5.200 (H) 07/30/2022   T4TOTAL 8.4 07/01/2020   Last vitamin D Lab Results  Component Value Date   VD25OH 77.1 07/30/2022   Last vitamin B12 and Folate Lab Results  Component Value Date   VITAMINB12 607 07/30/2022   FOLATE 7.8 07/30/2022       Objective    BP 128/60 (BP Location: Right Arm, Patient Position: Sitting, Cuff Size: Large)   Pulse 72   Ht 5\' 4"  (1.626 m)   Wt 200 lb (90.7 kg)   SpO2 97%   BMI 34.33 kg/m   BP Readings from Last 3 Encounters:  08/31/22 128/60  07/30/22 (!) 132/53  05/01/22 135/70    Wt Readings from Last 3 Encounters:  08/31/22 200 lb (90.7 kg)  08/05/22 199 lb (90.3 kg)  07/30/22 199 lb 11.2 oz (90.6 kg)   SpO2 Readings from Last 3 Encounters:  08/31/22 97%  05/01/22 95%  02/03/22 97%   Physical Exam Vitals and nursing note reviewed.  Constitutional:      General: She is not in acute distress.    Appearance: Normal appearance. She is obese. She is not ill-appearing, toxic-appearing or diaphoretic.  HENT:     Head: Normocephalic and atraumatic.  Cardiovascular:     Rate and Rhythm: Normal rate and regular rhythm.     Pulses: Normal pulses.     Heart sounds: Normal heart sounds. No murmur heard.    No friction rub. No gallop.  Pulmonary:     Effort: Pulmonary effort is normal. No respiratory distress.     Breath sounds: Normal breath sounds. No stridor. No wheezing, rhonchi or rales.  Chest:     Chest wall: No tenderness.  Abdominal:     General: Bowel sounds are normal.     Palpations: Abdomen is soft.     Tenderness: There is no abdominal tenderness. There is no right CVA tenderness or left CVA tenderness.  Musculoskeletal:        General: Tenderness present. No swelling, deformity or signs of injury. Normal range of motion.     Right lower leg: No edema.     Left lower leg: No edema.     Comments: Chronic back pain; occ flares on L side  Skin:    General: Skin is warm and dry.     Capillary Refill: Capillary refill takes less than 2 seconds.     Coloration: Skin is not jaundiced or pale.     Findings: No bruising, erythema, lesion or rash.  Neurological:     General: No focal deficit present.     Mental Status: She is alert and oriented to person, place, and time. Mental status is at baseline.     Cranial Nerves: No cranial nerve deficit.     Sensory: No sensory deficit.     Motor: No weakness.     Coordination: Coordination normal.  Psychiatric:        Mood and Affect: Mood normal.        Behavior: Behavior normal.        Thought  Content: Thought content normal.        Judgment: Judgment normal.    No results found for any visits on 08/31/22.  Assessment & Plan     Problem List Items Addressed This Visit  Other   Bilateral leg edema - Primary    Chronic, improved in mornings; worse in afternoons Will check BMP today given ongoing BP medication titration under Dr Kirke Corin BP remains stable today <130/<80 On HCTZ 12.5 once and Norvasc 5 mg, down from 10 mg Also on coreg 12.5 mg BID and cozaar 100 mg       Relevant Orders   Basic Metabolic Panel (BMET)   Cellulitis of lower extremity    Acute, with associated edema; not improved with use of topicals or increased water intake Recommend doxy to assist BID dosing x 10 days Continue to monitor hydration, edema, and redness      Chronic bilateral low back pain without sciatica    Chronic, ongoing flare since d/c of NSAIDs; will restart norco to assist with ongoing pain s/p previous injection at [facility]; previously tolerated the medication last Fall      Relevant Medications   HYDROcodone-acetaminophen (NORCO/VICODIN) 5-325 MG tablet   Other Relevant Orders   Basic Metabolic Panel (BMET)   Return if symptoms worsen or fail to improve.     Leilani Merl, FNP, have reviewed all documentation for this visit. The documentation on 08/31/22 for the exam, diagnosis, procedures, and orders are all accurate and complete.  Jacky Kindle, FNP  [facility] [phone] (phone) [phone] (fax)  [facility]

## 2022-09-01 LAB — BASIC METABOLIC PANEL
BUN/Creatinine Ratio: 17 (ref 12–28)
BUN: 18 mg/dL (ref 8–27)
CO2: 22 mmol/L (ref 20–29)
Calcium: 9.6 mg/dL (ref 8.7–10.3)
Chloride: 100 mmol/L (ref 96–106)
Creatinine, Ser: 1.09 mg/dL — ABNORMAL HIGH (ref 0.57–1.00)
Glucose: 109 mg/dL — ABNORMAL HIGH (ref 70–99)
Potassium: 3.7 mmol/L (ref 3.5–5.2)
Sodium: 138 mmol/L (ref 134–144)
eGFR: 51 mL/min/{1.73_m2} — ABNORMAL LOW (ref >59)

## 2022-09-01 NOTE — Progress Notes (Signed)
Labs relatively stable -glucose elevation remains low 100s -creatinine elevation remains 1-1.1 -consistent eGFR in low 50s.  Continue 48 oz/water/daily

## 2022-09-02 ENCOUNTER — Other Ambulatory Visit: Payer: Self-pay | Admitting: Family Medicine

## 2022-09-02 DIAGNOSIS — I10 Essential (primary) hypertension: Secondary | ICD-10-CM

## 2022-09-02 MED ORDER — HYDROCHLOROTHIAZIDE 12.5 MG PO CAPS: 12.5000 mg | ORAL_CAPSULE | Freq: Two times a day (BID) | ORAL | 3 refills | Status: DC

## 2022-09-02 MED ORDER — AMLODIPINE BESYLATE 5 MG PO TABS: 2.5000 mg | ORAL_TABLET | Freq: Every day | ORAL | 0 refills | Status: DC

## 2022-09-02 MED ORDER — AMLODIPINE BESYLATE 2.5 MG PO TABS: 2.5000 mg | ORAL_TABLET | Freq: Every day | ORAL | 3 refills | Status: DC

## 2022-09-02 NOTE — Telephone Encounter (Signed)
Her primary care provider sent me a message regarding this and I communicated with her regarding increasing hydrochlorothiazide and decreasing amlodipine.  Her primary care provider will reach out to her.

## 2022-09-03 ENCOUNTER — Telehealth: Payer: Self-pay

## 2022-09-03 NOTE — Telephone Encounter (Signed)
-----   Message from Jacky Kindle, FNP sent at 09/02/2022  5:06 PM EDT ----- Regarding: FW: BP/Edema Please let patient know  ----- Message ----- From: Iran Ouch, MD Sent: 09/02/2022   4:50 PM EDT To: Jacky Kindle, FNP Subject: RE: BP/Edema                                   Yes I recommend increasing hydrochlorothiazide to 25 mg once daily and decreasing amlodipine to 2.5 mg once daily. Thanks.     ----- Message ----- From: Jacky Kindle, FNP Sent: 08/31/2022   2:02 PM EDT To: Iran Ouch, MD; Suzie Portela Nurse Subject: BP/Edema                                       Ms Peet in today; ongoing edema in LE. Improved in AM with use of HCTZ 12.5; worse in the afternoon to the point she cannot get her shoes on/off easily. Will repeat BMP today; however, wanted to see if you wanted to revise HCTZ from 12.5 to 25 vs 12.5 BID. Could still be side effects from Norvasc titration from 10 mg to 5 mg. However, reddened legs also consistent with cellulitis which I planned to treat today.  Jacky Kindle, FNP  [facility] [address] [place], [place] [address] [phone] (phone) [phone] (fax) [facility]

## 2022-09-03 NOTE — Telephone Encounter (Signed)
Patient advised and verbalized understanding 

## 2022-09-16 ENCOUNTER — Ambulatory Visit: Payer: Self-pay | Admitting: *Deleted

## 2022-09-16 NOTE — Telephone Encounter (Signed)
Summary: Side effects to medication, concerned   Medications not working, experiencing side effects. Blood pressure + fluid medication. Has muscle cramps. Painful.   amLODipine (NORVASC) 2.5 MG tablet hydrochlorothiazide (MICROZIDE) 12.5 MG capsule       Chief Complaint: swelling feet/ankles, muscle spasms, possible SE from medications norvasc and microzide Symptoms: started medication dose changes and meds 08/21/22 and has noticed swelling in feet and ankles can walk but has been painful. Not sleeping . Dizziness only when getting up from laying down not from seated position or standing. Muscle cramps, spasms noted getting worsen in multiple areas of body lasting greater than a few seconds before going away.  Frequency: last week Pertinent Negatives: Patient denies chest pain no difficulty breathing  Disposition: [] ED /[] Urgent Care (no appt availability in office) / [x] Appointment(In office/virtual)/ []  [facility]/ [] Home Care/ [] Refused Recommended Disposition /[] [place] [facility]/ []  Follow-up with PCP Additional Notes:   Please advise . Appt scheduled for tomorrow with PCP. Please advise if appt needed for today or if patient needs to stop medications.     Reason for Disposition  [1] Caller has URGENT medicine question about med that PCP or specialist prescribed AND [2] triager unable to answer question  Answer Assessment - Initial Assessment Questions 1. NAME of MEDICINE: "What medicine(s) are you calling about?"     Microzide 12.5 mg and norvasc 2.5 mg 2. QUESTION: "What is your question?" (e.g., double dose of medicine, side effect)     Can medications cause side effects of feet and ankle swelling and muscle spasms/ cramps in different areas? 3. PRESCRIBER: "Who prescribed the medicine?" Reason: if prescribed by specialist, call should be referred to that group.     PCP 4. SYMPTOMS: "Do you have any symptoms?" If Yes, ask: "What symptoms are you having?"  "How  bad are the symptoms (e.g., mild, moderate, severe)     Yes muscle aches, cramps , spasms, swelling feet and ankles  5. PREGNANCY:  "Is there any chance that you are pregnant?" "When was your last menstrual period?"     na  Protocols used: Medication Question Call-A-AH

## 2022-09-17 ENCOUNTER — Ambulatory Visit: Payer: Medicare PPO | Admitting: Family Medicine

## 2022-09-17 ENCOUNTER — Encounter: Payer: Self-pay | Admitting: Family Medicine

## 2022-09-17 VITALS — BP 143/73 | HR 70 | Ht 64.5 in | Wt 197.0 lb

## 2022-09-17 DIAGNOSIS — R600 Localized edema: Secondary | ICD-10-CM | POA: Diagnosis not present

## 2022-09-17 DIAGNOSIS — R252 Cramp and spasm: Secondary | ICD-10-CM | POA: Diagnosis not present

## 2022-09-17 DIAGNOSIS — I10 Essential (primary) hypertension: Secondary | ICD-10-CM | POA: Diagnosis not present

## 2022-09-17 NOTE — Progress Notes (Signed)
Established patient visit   [name]   DOB: Sep 05, 1940   82 y.o. Female  MRN: [id number] Visit Date: 09/17/2022  Today's healthcare provider: Jacky Kindle, FNP  Re Introduced to nurse practitioner role and practice setting.  All questions answered.  Discussed provider/patient relationship and expectations.  Chief Complaint  Patient presents with   Follow-up    Pt stated--Rx HydroChlorothiaziad --causes muscle aching radiated different area after taking for 2 days. Both legs looking better.   Subjective    HPI HPI     Follow-up    Additional comments: Pt stated--Rx HydroChlorothiaziad --causes muscle aching radiated different area after taking for 2 days. Both legs looking better.      Last edited by Shelly Bombard, CMA on 09/17/2022  1:09 PM.      Medications: Outpatient Medications Prior to Visit  Medication Sig   ALPRAZolam (XANAX) 0.25 MG tablet Take 1 tablet (0.25 mg total) by mouth at bedtime.   amLODipine (NORVASC) 2.5 MG tablet Take 1 tablet (2.5 mg total) by mouth daily.   carvedilol (COREG) 12.5 MG tablet Take 1 tablet (12.5 mg total) by mouth 2 (two) times daily with a meal.   Cholecalciferol (VITAMIN D3) 1.25 MG (50000 UT) CAPS TAKE ONE CAPSULE BY MOUTH ONCE A WEEK   colestipol (COLESTID) 1 g tablet Take 1 tablet (1 g total) by mouth 2 (two) times daily.   cyclobenzaprine (FLEXERIL) 10 MG tablet Take 1 tablet (10 mg total) by mouth at bedtime as needed for muscle spasms.   donepezil (ARICEPT) 10 MG tablet Take 1 tablet (10 mg total) by mouth at bedtime.   doxycycline (VIBRA-TABS) 100 MG tablet Take 1 tablet (100 mg total) by mouth 2 (two) times daily.   ezetimibe (ZETIA) 10 MG tablet Take 1 tablet (10 mg total) by mouth at bedtime.   gabapentin (NEURONTIN) 600 MG tablet TAKE ONE TABLET BY MOUTH TWICE A DAY   hydrochlorothiazide (MICROZIDE) 12.5 MG capsule Take 1 capsule (12.5 mg total) by mouth 2 (two) times daily.   HYDROcodone-acetaminophen  (NORCO/VICODIN) 5-325 MG tablet Take 1-2 tablets by mouth daily as needed for severe pain.   levothyroxine (SYNTHROID) 112 MCG tablet TAKE ONE TABLET BY MOUTH ONE TIME DAILY BEFORE BREAKFAST   losartan (COZAAR) 100 MG tablet Take 1 tablet (100 mg total) by mouth daily.   Magnesium 250 MG TABS Take 1 tablet by mouth daily.   pantoprazole (PROTONIX) 40 MG tablet TAKE ONE TABLET BY MOUTH EVERY MORNING   zolpidem (AMBIEN) 10 MG tablet Take 1 tablet (10 mg total) by mouth at bedtime.   [DISCONTINUED] amLODipine (NORVASC) 5 MG tablet Take 0.5 tablets (2.5 mg total) by mouth daily.   No facility-administered medications prior to visit.    Review of Systems  Last CBC Lab Results  Component Value Date   WBC 15.0 (H) 08/04/2022   HGB 14.1 08/04/2022   HCT 42.3 08/04/2022   MCV 92.8 08/04/2022   MCH 30.9 08/04/2022   RDW 13.7 08/04/2022   PLT 293 08/04/2022   Last metabolic panel Lab Results  Component Value Date   GLUCOSE 109 (H) 08/31/2022   NA 138 08/31/2022   K 3.7 08/31/2022   CL 100 08/31/2022   CO2 22 08/31/2022   BUN 18 08/31/2022   CREATININE 1.09 (H) 08/31/2022   EGFR 51 (L) 08/31/2022   CALCIUM 9.6 08/31/2022   PHOS 4.3 07/30/2022   PROT 6.7 07/30/2022   ALBUMIN 4.4 07/30/2022  LABGLOB 2.3 07/30/2022   AGRATIO 1.9 07/30/2022   BILITOT 0.3 07/30/2022   ALKPHOS 106 07/30/2022   AST 22 07/30/2022   ALT 21 07/30/2022   ANIONGAP 12 05/26/2022   Last lipids Lab Results  Component Value Date   CHOL 187 07/30/2022   HDL 48 07/30/2022   LDLCALC 112 (H) 07/30/2022   TRIG 155 (H) 07/30/2022   CHOLHDL 3.9 07/30/2022   Last hemoglobin A1c Lab Results  Component Value Date   HGBA1C 6.4 (H) 07/30/2022   Last thyroid functions Lab Results  Component Value Date   TSH 5.200 (H) 07/30/2022   T4TOTAL 8.4 07/01/2020   Last vitamin D Lab Results  Component Value Date   VD25OH 77.1 07/30/2022   Last vitamin B12 and Folate Lab Results  Component Value Date    VITAMINB12 607 07/30/2022   FOLATE 7.8 07/30/2022       Objective    BP (!) 143/73 (BP Location: Right Arm, Patient Position: Sitting, Cuff Size: Large)   Pulse 70   Ht 5' 4.5" (1.638 m)   Wt 197 lb (89.4 kg)   SpO2 98%   BMI 33.29 kg/m   BP Readings from Last 3 Encounters:  09/17/22 (!) 143/73  08/31/22 128/60  07/30/22 (!) 132/53   Wt Readings from Last 3 Encounters:  09/17/22 197 lb (89.4 kg)  08/31/22 200 lb (90.7 kg)  08/05/22 199 lb (90.3 kg)   SpO2 Readings from Last 3 Encounters:  09/17/22 98%  08/31/22 97%  05/01/22 95%      Physical Exam Vitals and nursing note reviewed.  Constitutional:      General: She is not in acute distress.    Appearance: Normal appearance. She is obese. She is not ill-appearing, toxic-appearing or diaphoretic.  HENT:     Head: Normocephalic and atraumatic.  Cardiovascular:     Rate and Rhythm: Normal rate and regular rhythm.     Pulses: Normal pulses.     Heart sounds: Normal heart sounds. No murmur heard.    No friction rub. No gallop.     Comments: LLE warm to touch; continue to monitor fluid intake, elevation and c/f edema. Use of compression hose warranted- defer additional abx at this time  Pulmonary:     Effort: Pulmonary effort is normal. No respiratory distress.     Breath sounds: Normal breath sounds. No stridor. No wheezing, rhonchi or rales.  Chest:     Chest wall: No tenderness.  Abdominal:     General: Bowel sounds are normal.     Palpations: Abdomen is soft.  Musculoskeletal:        General: No swelling, tenderness, deformity or signs of injury. Normal range of motion.     Right lower leg: 1+ Pitting Edema present.     Left lower leg: 1+ Pitting Edema present.  Skin:    General: Skin is warm and dry.     Capillary Refill: Capillary refill takes less than 2 seconds.     Coloration: Skin is not jaundiced or pale.     Findings: No bruising, erythema, lesion or rash.  Neurological:     General: No focal deficit  present.     Mental Status: She is alert and oriented to person, place, and time. Mental status is at baseline.     Cranial Nerves: No cranial nerve deficit.     Sensory: No sensory deficit.     Motor: No weakness.     Coordination: Coordination normal.  Psychiatric:  Mood and Affect: Mood normal.        Behavior: Behavior normal.        Thought Content: Thought content normal.        Judgment: Judgment normal.     No results found for any visits on 09/17/22.  Assessment & Plan     Problem List Items Addressed This Visit       Cardiovascular and Mediastinum   Essential (primary) hypertension    Acute, elevated Multi-modal BP measurement Has been titrated down on norvasc from 10 mg to 5 mg now to 2.5 mg Also titrated up on losartan, currently at 100 Titrate off maxzide to hctz 12.5 bid; with complaints Also on coreg 12.5 mg- could titrate to 25 mg if labs indicate hyponatremia, hypokalemia or hypomagnesia       Relevant Orders   Basic Metabolic Panel (BMET)   Magnesium     Other   Bilateral leg edema    Acute, improved Continue to monitor fluid intake, sodium intake and activity level      Relevant Orders   Basic Metabolic Panel (BMET)   Magnesium   Leg cramping - Primary    Acute worsening Repeat BMP and Mg Hold HCTZ at this time       Relevant Orders   Basic Metabolic Panel (BMET)   Magnesium   Return if symptoms worsen or fail to improve.     Leilani Merl, FNP, have reviewed all documentation for this visit. The documentation on 09/17/22 for the exam, diagnosis, procedures, and orders are all accurate and complete.  Jacky Kindle, FNP  [facility] [phone] (phone) [phone] (fax)  [facility]

## 2022-09-17 NOTE — Assessment & Plan Note (Signed)
Acute worsening Repeat BMP and Mg Hold HCTZ at this time

## 2022-09-17 NOTE — Assessment & Plan Note (Signed)
Acute, elevated Multi-modal BP measurement Has been titrated down on norvasc from 10 mg to 5 mg now to 2.5 mg Also titrated up on losartan, currently at 100 Titrate off maxzide to hctz 12.5 bid; with complaints Also on coreg 12.5 mg- could titrate to 25 mg if labs indicate hyponatremia, hypokalemia or hypomagnesia

## 2022-09-17 NOTE — Patient Instructions (Signed)
LABS PENDING- Will decide on next steps with HCTZ dosing and Norvasc, Amlodipine Likely drinking too much fluids- reports 128 oz water/day as goal- adjust to 48-64 oz/water Continue to monitor cramping, home BP and home HR

## 2022-09-17 NOTE — Assessment & Plan Note (Signed)
Acute, improved Continue to monitor fluid intake, sodium intake and activity level

## 2022-09-18 LAB — MAGNESIUM: Magnesium: 1.8 mg/dL (ref 1.6–2.3)

## 2022-09-18 LAB — BASIC METABOLIC PANEL
BUN/Creatinine Ratio: 21 (ref 12–28)
BUN: 24 mg/dL (ref 8–27)
CO2: 22 mmol/L (ref 20–29)
Calcium: 9.5 mg/dL (ref 8.7–10.3)
Chloride: 100 mmol/L (ref 96–106)
Creatinine, Ser: 1.13 mg/dL — ABNORMAL HIGH (ref 0.57–1.00)
Glucose: 113 mg/dL — ABNORMAL HIGH (ref 70–99)
Potassium: 3.7 mmol/L (ref 3.5–5.2)
Sodium: 139 mmol/L (ref 134–144)
eGFR: 49 mL/min/{1.73_m2} — ABNORMAL LOW (ref >59)

## 2022-09-18 NOTE — Progress Notes (Signed)
Labs are relatively stable; could benefit from MVI or increased magnesium intake in diet to assist.

## 2022-09-22 ENCOUNTER — Ambulatory Visit: Payer: Self-pay | Admitting: *Deleted

## 2022-09-22 NOTE — Telephone Encounter (Signed)
Summary: diarrhea with medication   Pt states that she is still having issues with taking her medication: hydrochlorothiazide (MICROZIDE) 12.5 MG capsule.  Pt states that her medication was reduced from taking 2 pills a day to taking 1 pill a day and she is still having issues with diarrhea. Pt states if she does not take the medicine she does not have diarrhea but when she takes it she keep having diarrhea. Please advise.           Chief Complaint: Medication Reaction Symptoms: Pt states has taken the 1 tab HCTZ since last Thursday, has had multiple diarrheal stools daily each day. States did not take med today, no diarrhea.  Frequency: 09/17/22 when went to one tab. Pertinent Negatives: Patient denies  Disposition: [] ED /[] Urgent Care (no appt availability in office) / [] Appointment(In office/virtual)/ []  [place] [facility]/ [] Home Care/ [] Refused Recommended Disposition /[] [place] [facility]/ [x]  Follow-up with PCP Additional Notes: Please advise pt regarding medication  Reason for Disposition  [1] Caller has URGENT medicine question about med that PCP or specialist prescribed AND [2] triager unable to answer question  Answer Assessment - Initial Assessment Questions 1. NAME of MEDICINE: "What medicine(s) are you calling about?"     HCTZ 2. QUESTION: "What is your question?" (e.g., double dose of medicine, side effect)     Still with diarrhea after down to one tab 3. PRESCRIBER: "Who prescribed the medicine?" Reason: if prescribed by specialist, call should be referred to that group.     PCP 4. SYMPTOMS: "Do you have any symptoms?" If Yes, ask: "What symptoms are you having?"  "How bad are the symptoms (e.g., mild, moderate, severe)     Diarrhea  Protocols used: Medication Question Call-A-AH

## 2022-09-23 ENCOUNTER — Other Ambulatory Visit: Payer: Self-pay | Admitting: Neurology

## 2022-09-23 NOTE — Telephone Encounter (Signed)
Called, spoke with patient and scheduled an OV to discuss concerns with diarrhea and medication management.

## 2022-09-23 NOTE — Telephone Encounter (Signed)
Lmtcb to schedule appt.

## 2022-09-24 ENCOUNTER — Ambulatory Visit: Payer: Medicare PPO | Admitting: Family Medicine

## 2022-09-24 ENCOUNTER — Encounter: Payer: Self-pay | Admitting: Family Medicine

## 2022-09-24 VITALS — BP 128/65 | HR 69 | Temp 97.7°F | Wt 200.0 lb

## 2022-09-24 DIAGNOSIS — R197 Diarrhea, unspecified: Secondary | ICD-10-CM | POA: Diagnosis not present

## 2022-09-24 DIAGNOSIS — Z1231 Encounter for screening mammogram for malignant neoplasm of breast: Secondary | ICD-10-CM | POA: Insufficient documentation

## 2022-09-24 DIAGNOSIS — G588 Other specified mononeuropathies: Secondary | ICD-10-CM | POA: Diagnosis not present

## 2022-09-24 DIAGNOSIS — Z13820 Encounter for screening for osteoporosis: Secondary | ICD-10-CM | POA: Diagnosis not present

## 2022-09-24 DIAGNOSIS — R600 Localized edema: Secondary | ICD-10-CM | POA: Diagnosis not present

## 2022-09-24 MED ORDER — GABAPENTIN 600 MG PO TABS: 600.0000 mg | ORAL_TABLET | Freq: Two times a day (BID) | ORAL | 3 refills | Status: DC

## 2022-09-24 MED ORDER — POTASSIUM CHLORIDE CRYS ER 20 MEQ PO TBCR: EXTENDED_RELEASE_TABLET | ORAL | 0 refills | Status: DC

## 2022-09-24 MED ORDER — FUROSEMIDE 20 MG PO TABS: 20.0000 mg | ORAL_TABLET | Freq: Every day | ORAL | 0 refills | Status: DC

## 2022-09-24 MED ORDER — CYCLOBENZAPRINE HCL 10 MG PO TABS: 10.0000 mg | ORAL_TABLET | Freq: Every evening | ORAL | 3 refills | Status: DC | PRN

## 2022-09-24 NOTE — Assessment & Plan Note (Signed)
Hx of breast cysts; no current complaints/symptoms Order placed for mammogram; pt wishes to continue with screening given previous hx of cysts

## 2022-09-24 NOTE — Assessment & Plan Note (Signed)
Chronic, stable Request for refills of gabapentin 600 mg BID

## 2022-09-24 NOTE — Progress Notes (Signed)
Established patient visit  [name]   DOB: 04/27/1940   82 y.o. Female  MRN: [id number] Visit Date: 09/24/2022  Today's healthcare provider: Jacky Kindle, FNP  Re Introduced to nurse practitioner role and practice setting.  All questions answered.  Discussed provider/patient relationship and expectations.  Subjective    HPI   Diarrhea: Patient complains of diarrhea. Symptoms have been present for approximately 1 week. The symptoms are gradually improving. Stool frequency is approximately several per day. Patient estimates stool volume to be patient is unable to estimate stool volume. Diarrhea does occur at night. The patient has noted no bleeding associated with bowel movements. S/S started following increase in HCTZ.  Medications: Outpatient Medications Prior to Visit  Medication Sig   ALPRAZolam (XANAX) 0.25 MG tablet Take 1 tablet (0.25 mg total) by mouth at bedtime.   amLODipine (NORVASC) 2.5 MG tablet Take 1 tablet (2.5 mg total) by mouth daily.   carvedilol (COREG) 12.5 MG tablet Take 1 tablet (12.5 mg total) by mouth 2 (two) times daily with a meal.   Cholecalciferol (VITAMIN D3) 1.25 MG (50000 UT) CAPS TAKE ONE CAPSULE BY MOUTH ONCE A WEEK   colestipol (COLESTID) 1 g tablet Take 1 tablet (1 g total) by mouth 2 (two) times daily.   donepezil (ARICEPT) 10 MG tablet Take 1 tablet (10 mg total) by mouth at bedtime.   doxycycline (VIBRA-TABS) 100 MG tablet Take 1 tablet (100 mg total) by mouth 2 (two) times daily.   ezetimibe (ZETIA) 10 MG tablet Take 1 tablet (10 mg total) by mouth at bedtime.   HYDROcodone-acetaminophen (NORCO/VICODIN) 5-325 MG tablet Take 1-2 tablets by mouth daily as needed for severe pain.   levothyroxine (SYNTHROID) 112 MCG tablet TAKE ONE TABLET BY MOUTH ONE TIME DAILY BEFORE BREAKFAST   losartan (COZAAR) 100 MG tablet Take 1 tablet (100 mg total) by mouth daily.   Magnesium 250 MG TABS Take 1 tablet by mouth daily.   pantoprazole (PROTONIX)  40 MG tablet TAKE ONE TABLET BY MOUTH EVERY MORNING   zolpidem (AMBIEN) 10 MG tablet Take 1 tablet (10 mg total) by mouth at bedtime.   [DISCONTINUED] cyclobenzaprine (FLEXERIL) 10 MG tablet Take 1 tablet (10 mg total) by mouth at bedtime as needed for muscle spasms.   [DISCONTINUED] gabapentin (NEURONTIN) 600 MG tablet TAKE ONE TABLET BY MOUTH TWICE A DAY   [DISCONTINUED] hydrochlorothiazide (MICROZIDE) 12.5 MG capsule Take 1 capsule (12.5 mg total) by mouth 2 (two) times daily. (Patient not taking: Reported on 09/24/2022)   No facility-administered medications prior to visit.    Review of Systems     Objective    BP 128/65 (BP Location: Right Arm, Patient Position: Sitting, Cuff Size: Large)   Pulse 69   Temp 97.7 F (36.5 C) (Oral)   Wt 200 lb (90.7 kg)   SpO2 98%   BMI 33.80 kg/m   Vitals:   09/24/22 1105 09/24/22 1107  BP: (!) 160/60 128/65  Pulse: 69   Temp: 97.7 F (36.5 C)   TempSrc: Oral   SpO2: 98%   Weight: 200 lb (90.7 kg)    Physical Exam Vitals and nursing note reviewed.  Constitutional:      General: She is not in acute distress.    Appearance: Normal appearance. She is overweight. She is not ill-appearing, toxic-appearing or diaphoretic.  HENT:     Head: Normocephalic and atraumatic.  Cardiovascular:     Rate and Rhythm: Normal rate.  Pulses: Normal pulses.  Pulmonary:     Effort: Pulmonary effort is normal.  Abdominal:     Comments: BM frequency has improved  Musculoskeletal:        General: No swelling, tenderness, deformity or signs of injury. Normal range of motion.     Right lower leg: No edema.     Left lower leg: No edema.     Comments: At baseline   Skin:    General: Skin is warm and dry.     Capillary Refill: Capillary refill takes less than 2 seconds.     Coloration: Skin is not jaundiced or pale.     Findings: No bruising, erythema, lesion or rash.  Neurological:     General: No focal deficit present.     Mental Status: She is  alert and oriented to person, place, and time. Mental status is at baseline.     Cranial Nerves: No cranial nerve deficit.     Sensory: No sensory deficit.     Motor: No weakness.     Coordination: Coordination normal.  Psychiatric:        Mood and Affect: Mood normal.        Behavior: Behavior normal.        Thought Content: Thought content normal.        Judgment: Judgment normal.     No results found for any visits on 09/24/22.  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, worsening since stopping HCTZ d/t side effects Will encourage daily weights- will change to lasix and Kclor supps to assist Continue to f/u as needed      Relevant Medications   furosemide (LASIX) 20 MG tablet   potassium chloride SA (KLOR-CON M) 20 MEQ tablet   Intercostal neuralgia    Chronic, stable Request for refills of gabapentin 600 mg BID      Relevant Medications   cyclobenzaprine (FLEXERIL) 10 MG tablet   gabapentin (NEURONTIN) 600 MG tablet   Screening for osteoporosis    57 female; r/f osteoporosis Recommend DEXA      Relevant Orders   DG Bone Density   Screening mammogram for breast cancer    Hx of breast cysts; no current complaints/symptoms Order placed for mammogram; pt wishes to continue with screening given previous hx of cysts      Relevant Orders   MM 3D SCREENING MAMMOGRAM BILATERAL BREAST   Return if symptoms worsen or fail to improve.     Leilani Merl, FNP, have reviewed all documentation for this visit. The documentation on 09/24/22 for the exam, diagnosis, procedures, and orders are all accurate and complete.  Jacky Kindle, FNP  [facility] [phone] (phone) [phone]  (fax)  [facility]

## 2022-09-24 NOTE — Assessment & Plan Note (Signed)
Chronic, worsening since stopping HCTZ d/t side effects Will encourage daily weights- will change to lasix and Kclor supps to assist Continue to f/u as needed

## 2022-09-24 NOTE — Assessment & Plan Note (Signed)
Acute, thought to be SE of HCTZ increase iso edema worsening Episodes improving w/o use of HCTZ No illness, other s/s, recent travel No known sick contacts

## 2022-09-24 NOTE — Assessment & Plan Note (Signed)
55 female; r/f osteoporosis Recommend DEXA

## 2022-09-24 NOTE — Patient Instructions (Addendum)
Stop previous medication- HCTZ  Use fluid medication and potassium supplement as needed -weigh each day -if weight goes up >2 lbs in 1 day or >5 lbs in 7 day period--> use fluid pill and potassium supplement  Can follow up as needed  Please call and schedule your mammogram:  [facility]  [address], [address] [facility] [place],  [place]  [address] Get Driving Directions Main: [phone]  Sunday:Closed Monday:7:20 AM - 5:00 PM Tuesday:7:20 AM - 5:00 PM Wednesday:7:20 AM - 5:00 PM Thursday:7:20 AM - 5:00 PM Friday:7:20 AM - 4:30 PM Saturday:Closed

## 2022-09-25 ENCOUNTER — Other Ambulatory Visit: Payer: Self-pay | Admitting: Neurology

## 2022-09-28 ENCOUNTER — Other Ambulatory Visit: Payer: Self-pay

## 2022-09-28 ENCOUNTER — Ambulatory Visit: Payer: Medicare PPO | Admitting: Neurology

## 2022-09-28 ENCOUNTER — Telehealth: Payer: Self-pay | Admitting: Family Medicine

## 2022-09-28 NOTE — Telephone Encounter (Signed)
[facility] pharmacy faxed refill request for the following medications:     donepezil (ARICEPT) 10 MG tablet    Please advise

## 2022-09-30 ENCOUNTER — Ambulatory Visit: Payer: Medicare PPO | Attending: Nurse Practitioner | Admitting: Nurse Practitioner

## 2022-09-30 ENCOUNTER — Encounter: Payer: Self-pay | Admitting: Nurse Practitioner

## 2022-09-30 VITALS — BP 130/70 | HR 73 | Ht 65.0 in | Wt 200.1 lb

## 2022-09-30 DIAGNOSIS — I5189 Other ill-defined heart diseases: Secondary | ICD-10-CM

## 2022-09-30 DIAGNOSIS — E782 Mixed hyperlipidemia: Secondary | ICD-10-CM | POA: Diagnosis not present

## 2022-09-30 DIAGNOSIS — N183 Chronic kidney disease, stage 3 unspecified: Secondary | ICD-10-CM | POA: Diagnosis not present

## 2022-09-30 DIAGNOSIS — R600 Localized edema: Secondary | ICD-10-CM

## 2022-09-30 DIAGNOSIS — I10 Essential (primary) hypertension: Secondary | ICD-10-CM | POA: Diagnosis not present

## 2022-09-30 NOTE — Patient Instructions (Addendum)
Medication Instructions:  Stop the Amlodipine  *If you need a refill on your cardiac medications before your next appointment, please call your pharmacy*   Lab Work: None ordered If you have labs (blood work) drawn today and your tests are completely normal, you will receive your results only by: MyChart Message (if you have MyChart) OR A paper copy in the mail If you have any lab test that is abnormal or we need to change your treatment, we will call you to review the results.   Testing/Procedures: Your physician has requested that you have an echocardiogram. Echocardiography is a painless test that uses sound waves to create images of your heart. It provides your doctor with information about the size and shape of your heart and how well your heart's chambers and valves are working.   You may receive an ultrasound enhancing agent through an IV if needed to better visualize your heart during the echo. This procedure takes approximately one hour.  There are no restrictions for this procedure.  This will take place at [address] [address], [place] [address]    Follow-Up: At [facility], you and your health needs are our priority.  As part of our continuing mission to provide you with exceptional heart care, we have created designated Provider Care Teams.  These Care Teams include your primary Cardiologist (physician) and Advanced Practice Providers (APPs -  Physician Assistants and Nurse Practitioners) who all work together to provide you with the care you need, when you need it.  We recommend signing up for the patient portal called "MyChart".  Sign up information is provided on this After Visit Summary.  MyChart is used to connect with patients for Virtual Visits (Telemedicine).  Patients are able to view lab/test results, encounter notes, upcoming appointments, etc.  Non-urgent messages can be sent to your provider as well.   To learn more about  what you can do with MyChart, go to [email or web address].    Your next appointment:   1 month(s) or after the echo  Provider:   You may see Lorine Bears, MD or one of the following Advanced Practice Providers on your designated Care Team:   Nicolasa Ducking, NP

## 2022-09-30 NOTE — Progress Notes (Signed)
Office Visit    Patient Name: Cathy Barnes Date of Encounter: 09/30/2022  Primary Care Provider:  Jacky Kindle, FNP Primary Cardiologist:  Lorine Bears, MD  Chief Complaint    female with history of hypertension, hyperlipidemia, borderline diabetes, hypothyroidism, CKD III, and chronic lymphocytic leukemia, presents for follow-up related to HTN.   Past Medical History    Past Medical History:  Diagnosis Date   Allergy    some   Cataract    bilaterally both eyes    Chest pain    a. 07/2019 MV: EF 77%, no ischemia/infarct.   CKD (chronic kidney disease), stage III (HCC)    CLL (chronic lymphocytic leukemia) (HCC)    stage 0   Diastolic dysfunction    a. 08/2019 Echo: EF 55-60%, no rwma, Gr1 DD, nl RV size/fxn.   Gallstones    GERD (gastroesophageal reflux disease)    H/O blood clots    Hydrocephalus (HCC)    [facility]    Hyperglycemia    Hyperlipidemia    Hypertension    a. 03/2022 Renal Duplex: No RAS.   Lymphocytosis    monoclonal b cell   Migraine, unspecified, without mention of intractable migraine without mention of status migrainosus 12/14/2012   Osteoarthritis, chronic    Osteoporosis    Peptic ulcer    some bleeding with ulcers as well    Past Surgical History:  Procedure Laterality Date   ABDOMINAL HYSTERECTOMY     BREAST CYST ASPIRATION Left 1993   Removed   breast cyst removed Right    benign    BREAST LUMPECTOMY Bilateral    COLONOSCOPY     ERCP N/A 02/04/2018   Procedure: ENDOSCOPIC RETROGRADE CHOLANGIOPANCREATOGRAPHY (ERCP);  Surgeon: Midge Minium, MD;  Location: [facility] ENDOSCOPY;  Service: Endoscopy;  Laterality: N/A;   ERCP N/A 02/28/2018   Procedure: ENDOSCOPIC RETROGRADE CHOLANGIOPANCREATOGRAPHY (ERCP) STENT REMOVAL;  Surgeon: Midge Minium, MD;  Location: [facility] ENDOSCOPY;  Service: Endoscopy;  Laterality: N/A;   EYE SURGERY     growth removal Left 2019   L wrist growth removed, abnormal cells   History of Cataract surgery Right  2011   left 2011   PARTIAL HYSTERECTOMY     POLYPECTOMY     ROBOTIC ASSISTED LAPAROSCOPIC CHOLECYSTECTOMY-MULTI SITE N/A 03/08/2018   Procedure: ROBOTIC ASSISTED LAPAROSCOPIC CHOLECYSTECTOMY-MULTI SITE;  Surgeon: Leafy Ro, MD;  Location: [facility] ORS;  Service: General;  Laterality: N/A;   S/P ACL repair Left    knee   TONSILLECTOMY     TUBAL LIGATION  1975   Bilateral    Allergies  Allergies  Allergen Reactions   Shellfish-Derived Products Anaphylaxis    Throat closes, rash   Hydrochlorothiazide Diarrhea    Microzide branding    Shellfish Allergy     Throat closes, rash   Latex Rash    Tapes,adhesives    History of Present Illness      82 y.o. y/o female with history of hypertension, hyperlipidemia, borderline diabetes, hypothyroidism, CKD III, and chronic lymphocytic leukemia.  She was previously evaluated by cardiology almost 20 years ago in the setting of chest pain and reportedly underwent stress testing and echocardiography which showed no significant abnormalities.  In April 2021, she was seen by Dr. Kirke Corin in the setting of dyspnea occurring with minimal activity as well as occasional right-sided chest squeezing and discomfort with exertion.  EKG was unremarkable.  Stress testing was performed and showed no ischemia or infarct.  Echo showed normal LV  function with grade 1 diastolic dysfunction and no significant valvular abnormalities.  In October 2022, she was diagnosed with left popliteal DVT and was treated w/ 12 months of eliquis - she is no longer taking.  In the setting of poorly controlled hypertension, she underwent renal artery duplex in December 2023, which was negative for renal artery stenosis.   His creatinine was last in cardiology clinic in January 2024, at which time losartan was increased to 100 mg daily in setting of ongoing hypertension.  Renal function and electrolytes have remained stable.  Unfortunately, throughout the Spring, she has been experiencing  dependent edema that progresses throughout the day.  She does find herself sitting for long periods of time during the day, but she will try and prop her feet up some.  She believes that she is careful w/ salt intake.  She has not been using compression.  Her amlodipine dose has been reduced to 2.5 mg daily.  Though she prev tolerated triamterene/hydrochlorothiazide, when placed on hydrochlorothiazide alone, she felt worse and this was subsequently discontinued and switched to Lasix.  In the setting of lower extremity swelling, her legs have been somewhat tender.  She denies chest pain, dyspnea, palpitations, PND, orthopnea, dizziness, syncope, or early satiety.  She has not noted any significant change in weight.  Blood pressures trend mildly elevated at home though typically in a normal range when seen in clinic.  Home Medications    Current Outpatient Medications  Medication Sig Dispense Refill   ALPRAZolam (XANAX) 0.25 MG tablet Take 1 tablet (0.25 mg total) by mouth at bedtime. 90 tablet 1   carvedilol (COREG) 12.5 MG tablet Take 1 tablet (12.5 mg total) by mouth 2 (two) times daily with a meal. 180 tablet 3   Cholecalciferol (VITAMIN D3) 1.25 MG (50000 UT) CAPS TAKE ONE CAPSULE BY MOUTH ONCE A WEEK 12 capsule 3   colestipol (COLESTID) 1 g tablet Take 1 tablet (1 g total) by mouth 2 (two) times daily. 180 tablet 5   cyclobenzaprine (FLEXERIL) 10 MG tablet Take 1 tablet (10 mg total) by mouth at bedtime as needed for muscle spasms. 90 tablet 3   donepezil (ARICEPT) 10 MG tablet TAKE ONE TABLET BY MOUTH AT BEDTIME 90 tablet 4   ezetimibe (ZETIA) 10 MG tablet Take 1 tablet (10 mg total) by mouth at bedtime. 90 tablet 1   furosemide (LASIX) 20 MG tablet Take 1 tablet (20 mg total) by mouth daily. 90 tablet 0   gabapentin (NEURONTIN) 600 MG tablet Take 1 tablet (600 mg total) by mouth 2 (two) times daily. 180 tablet 3   HYDROcodone-acetaminophen (NORCO/VICODIN) 5-325 MG tablet Take 1-2 tablets by  mouth daily as needed for severe pain. 60 tablet 0   levothyroxine (SYNTHROID) 112 MCG tablet TAKE ONE TABLET BY MOUTH ONE TIME DAILY BEFORE BREAKFAST 90 tablet 3   losartan (COZAAR) 100 MG tablet Take 1 tablet (100 mg total) by mouth daily. 90 tablet 3   Magnesium 250 MG TABS Take 1 tablet by mouth daily.     pantoprazole (PROTONIX) 40 MG tablet TAKE ONE TABLET BY MOUTH EVERY MORNING 90 tablet 0   potassium chloride SA (KLOR-CON M) 20 MEQ tablet Take 40 MEQ with use of Lasix 20 mg to prevent low potassium 180 tablet 0   zolpidem (AMBIEN) 10 MG tablet Take 1 tablet (10 mg total) by mouth at bedtime. 30 tablet 5   No current facility-administered medications for this visit.     Review of  Systems    Dependent edema over the past 6+ weeks that is not present first thing in the morning and progresses throughout the day.  She denies chest pain, dyspnea, palpitations, PND, orthopnea, dizziness, syncope, or early satiety.  All other systems reviewed and are otherwise negative except as noted above.    Physical Exam    VS:  BP 130/70 (BP Location: Right Arm, Patient Position: Sitting, Cuff Size: Large)   Pulse 73   Ht 5\' 5"  (1.651 m)   Wt 200 lb 2 oz (90.8 kg)   SpO2 96%   BMI 33.30 kg/m  , BMI Body mass index is 33.3 kg/m.     GEN: Well nourished, well developed, in no acute distress. HEENT: normal. Neck: Supple, no JVD, carotid bruits, or masses. Cardiac: RRR, no murmurs, rubs, or gallops. No clubbing, cyanosis, 1+ bilateral lower extremity edema.  Radials 2+/PT 2+ and equal bilaterally.  Respiratory:  Respirations regular and unlabored, clear to auscultation bilaterally. GI: Soft, nontender, nondistended, BS + x 4. MS: no deformity or atrophy. Skin: warm and dry, no rash. Neuro:  Strength and sensation are intact. Psych: Normal affect.  Accessory Clinical Findings    ECG personally reviewed by me today -regular sinus rhythm, 73, delayed R wave progression- no acute changes.  Lab  Results  Component Value Date   WBC 15.0 (H) 08/04/2022   HGB 14.1 08/04/2022   HCT 42.3 08/04/2022   MCV 92.8 08/04/2022   PLT 293 08/04/2022   Lab Results  Component Value Date   CREATININE 1.13 (H) 09/17/2022   BUN 24 09/17/2022   NA 139 09/17/2022   K 3.7 09/17/2022   CL 100 09/17/2022   CO2 22 09/17/2022   Lab Results  Component Value Date   ALT 21 07/30/2022   AST 22 07/30/2022   ALKPHOS 106 07/30/2022   BILITOT 0.3 07/30/2022   Lab Results  Component Value Date   CHOL 187 07/30/2022   HDL 48 07/30/2022   LDLCALC 112 (H) 07/30/2022   TRIG 155 (H) 07/30/2022   CHOLHDL 3.9 07/30/2022    Lab Results  Component Value Date   HGBA1C 6.4 (H) 07/30/2022    Assessment & Plan    1.  Dependent edema/diastolic dysfunction: Over the past 6+ weeks, patient has been experiencing dependent edema that is not present first thing in the morning but progresses throughout the day.  She initially tried HCTZ, which she did not tolerate and this was subsequently switched to Lasix.  Concurrently, amlodipine dose was reduced from 5 mg daily to 2.5 mg daily.  She has not been using compression and though she elevates her legs sometimes, she notes that this is not always consistent.  I am going to discontinue amlodipine as it may still be contributing.  She remains on Lasix 20 mg daily.  I encouraged her to be very careful with her sodium intake, keep her legs elevated when sitting, and also to use compression socks, which she seems amenable to.  We discussed that her blood pressure may rise of amlodipine and she will contact us in the next week or so if that is the case, at which time, we can increase her carvedilol to 18.75 mg twice daily.  Given somewhat, will follow-up echo.  Notably, she does not experience dyspnea or weight gain.  2.  Refractory hypertension: Blood pressure is actually stable today at 130/70.  As above, discontinuing amlodipine in the setting of lower extremity swelling.   May need to  increase carvedilol to 18.75 mg twice daily and patient is aware that she is to contact us if pressures start trending higher at home.  Continue losartan and Lasix.  3.  Hyperlipidemia: LDL 103 in September on Zetia 10 mg daily.  4.  Stage III chronic kidney disease: Creatinine stable at 1.13 May 30 with normal electrolytes.  Will need closer monitoring furosemide therapy.  Continue ARB.  5.  Disposition: Follow-up echo.  Follow-up in clinic in approximately 3 to 4 weeks or sooner if necessary.  Nicolasa Ducking, NP 09/30/2022, 4:30 PM

## 2022-10-08 ENCOUNTER — Ambulatory Visit: Payer: Medicare PPO | Attending: Nurse Practitioner

## 2022-10-08 DIAGNOSIS — R600 Localized edema: Secondary | ICD-10-CM | POA: Diagnosis not present

## 2022-10-08 LAB — ECHOCARDIOGRAM COMPLETE
Area-P 1/2: 2.83 cm2
S' Lateral: 2.6 cm

## 2022-10-14 ENCOUNTER — Inpatient Hospital Stay
Admission: RE | Admit: 2022-10-14 | Discharge: 2022-10-14 | Disposition: A | Discharge status: Home / Self Care | Payer: Self-pay | Source: Ambulatory Visit | Attending: Family Medicine | Admitting: Family Medicine

## 2022-10-14 ENCOUNTER — Other Ambulatory Visit: Payer: Self-pay | Admitting: *Deleted

## 2022-10-14 DIAGNOSIS — Z1231 Encounter for screening mammogram for malignant neoplasm of breast: Secondary | ICD-10-CM

## 2022-10-23 ENCOUNTER — Other Ambulatory Visit: Payer: Self-pay | Admitting: Cardiovascular Disease

## 2022-10-30 ENCOUNTER — Ambulatory Visit: Payer: Medicare PPO | Attending: Nurse Practitioner | Admitting: Nurse Practitioner

## 2022-10-30 ENCOUNTER — Encounter: Payer: Self-pay | Admitting: Nurse Practitioner

## 2022-10-30 VITALS — BP 146/59 | HR 72 | Ht 64.0 in | Wt 200.2 lb

## 2022-10-30 DIAGNOSIS — N183 Chronic kidney disease, stage 3 unspecified: Secondary | ICD-10-CM

## 2022-10-30 DIAGNOSIS — R600 Localized edema: Secondary | ICD-10-CM | POA: Diagnosis not present

## 2022-10-30 DIAGNOSIS — I10 Essential (primary) hypertension: Secondary | ICD-10-CM | POA: Diagnosis not present

## 2022-10-30 DIAGNOSIS — E782 Mixed hyperlipidemia: Secondary | ICD-10-CM | POA: Diagnosis not present

## 2022-10-30 MED ORDER — CARVEDILOL 12.5 MG PO TABS: 18.7500 mg | ORAL_TABLET | Freq: Two times a day (BID) | ORAL | 4 refills | Status: DC

## 2022-10-30 NOTE — Progress Notes (Signed)
Office Visit    Patient Name: Cathy Barnes Date of Encounter: 10/30/2022  Primary Care Provider:  Jacky Kindle, FNP Primary Cardiologist:  Cathy Bears, MD  Chief Complaint    82 y.o. female with a history of hypertension, hyperlipidemia, borderline diabetes, hypothyroidism, CKD 3, and chronic lymphocytic leukemia, who presents for follow-up related to hypertension.  Past Medical History    Past Medical History:  Diagnosis Date   (HFpEF) heart failure with preserved ejection fraction (HCC)    a. 08/2019 Echo: EF 55-60%, no rwma, Gr1 DD, nl RV size/fxn; b. 09/2022 Echo: Ef 60-65%, no rwma, GrI Dd, nl RV fxn, mildly dil LA. no significant valvular disease.   Allergy    some   Cataract    bilaterally both eyes    Chest pain    a. 07/2019 MV: EF 77%, no ischemia/infarct.   CKD (chronic kidney disease), stage III (HCC)    CLL (chronic lymphocytic leukemia) (HCC)    stage 0   Gallstones    GERD (gastroesophageal reflux disease)    H/O blood clots    Hydrocephalus (HCC)    Cathy Barnes    Hyperglycemia    Hyperlipidemia    Hypertension    a. 03/2022 Renal Duplex: No RAS.   Lymphocytosis    monoclonal b cell   Migraine, unspecified, without mention of intractable migraine without mention of status migrainosus 12/14/2012   Osteoarthritis, chronic    Osteoporosis    Peptic ulcer    some bleeding with ulcers as well    Past Surgical History:  Procedure Laterality Date   ABDOMINAL HYSTERECTOMY     BREAST CYST ASPIRATION Left 1993   Removed   breast cyst removed Right    benign    BREAST LUMPECTOMY Bilateral    COLONOSCOPY     ERCP N/A 02/04/2018   Procedure: ENDOSCOPIC RETROGRADE CHOLANGIOPANCREATOGRAPHY (ERCP);  Surgeon: Cathy Minium, MD;  Location[facility] ENDOSCOPY;  Service: Endoscopy;  Laterality: N/A;   ERCP N/A 02/28/2018   Procedure: ENDOSCOPIC RETROGRADE CHOLANGIOPANCREATOGRAPHY (ERCP) STENT REMOVAL;  Surgeon: Cathy Minium, MD;  Location: [facility] ENDOSCOPY;   Service: Endoscopy;  Laterality: N/A;   EYE SURGERY     growth removal Left 2019   L wrist growth removed, abnormal cells   History of Cataract surgery Right 2011   left 2011   PARTIAL HYSTERECTOMY     POLYPECTOMY     ROBOTIC ASSISTED LAPAROSCOPIC CHOLECYSTECTOMY-MULTI SITE N/A 03/08/2018   Procedure: ROBOTIC ASSISTED LAPAROSCOPIC CHOLECYSTECTOMY-MULTI SITE;  Surgeon: Cathy Ro, MD;  Location: [facility] ORS;  Service: General;  Laterality: N/A;   S/P ACL repair Left    knee   TONSILLECTOMY     TUBAL LIGATION  1975   Bilateral    Allergies  Allergies  Allergen Reactions   Shellfish-Derived Products Anaphylaxis    Throat closes, rash   Hydrochlorothiazide Diarrhea    Microzide branding    Amlodipine     Lower ext edema   Lasix [Furosemide]     Profound itching   Shellfish Allergy     Throat closes, rash   Latex Rash    Tapes,adhesives    History of Present Illness      82 y.o. y/o female with history of hypertension, hyperlipidemia, borderline diabetes, hypothyroidism, CKD III, and chronic lymphocytic leukemia.  She was previously evaluated by cardiology almost 20 years ago in the setting of chest pain and reportedly underwent stress testing and echocardiography which showed no significant abnormalities.  In  April 2021, she was seen by Cathy Barnes in the setting of dyspnea occurring with minimal activity as well as occasional right-sided chest squeezing and discomfort with exertion.  EKG was unremarkable.  Stress testing was performed and showed no ischemia or infarct.  Echo showed normal LV function with grade 1 diastolic dysfunction and no significant valvular abnormalities.  In October 2022, she was diagnosed with left popliteal DVT and was treated w/ 12 months of eliquis - she is no longer taking.  In the setting of poorly controlled hypertension, she underwent renal artery duplex in December 2023, which was negative for renal artery stenosis.    Cathy Barnes was last seen in  June 2024, which time she reported worsening lower extremity swelling that progressed throughout the day.  As result of this, amlodipine dose was previously reduced from 5 mg to 2.5 mg daily.  I discontinued her amlodipine and continue Lasix 20 mg daily and we also discussed that she might require further titration carvedilol off of amlodipine.  Echo was obtained and showed normal LV function with grade 1 diastolic dysfunction and no significant valvular disease.  Since stopping amlodipine, she has been able to come completely off of lasix, as lower ext edema has more or less resolved.  She notes that while on Lasix, she had significant and somewhat unrelenting itching.  This has since resolved.  She occasionally notes mild swelling just above her heel at the end of the day, but has been using compression socks and is quite pleased with the result.  She denies chest pain, dyspnea, palpitations, PND, orthopnea, dizziness, syncope, or early satiety.  Home Medications    Current Outpatient Medications  Medication Sig Dispense Refill   ALPRAZolam (XANAX) 0.25 MG tablet Take 1 tablet (0.25 mg total) by mouth at bedtime. 90 tablet 1   Cholecalciferol (VITAMIN D3) 1.25 MG (50000 UT) CAPS TAKE ONE CAPSULE BY MOUTH ONCE A WEEK 12 capsule 3   colestipol (COLESTID) 1 g tablet Take 1 tablet (1 g total) by mouth 2 (two) times daily. 180 tablet 5   cyclobenzaprine (FLEXERIL) 10 MG tablet Take 1 tablet (10 mg total) by mouth at bedtime as needed for muscle spasms. 90 tablet 3   donepezil (ARICEPT) 10 MG tablet TAKE ONE TABLET BY MOUTH AT BEDTIME 90 tablet 4   ezetimibe (ZETIA) 10 MG tablet Take 1 tablet (10 mg total) by mouth at bedtime. 90 tablet 1   gabapentin (NEURONTIN) 600 MG tablet Take 1 tablet (600 mg total) by mouth 2 (two) times daily. 180 tablet 3   HYDROcodone-acetaminophen (NORCO/VICODIN) 5-325 MG tablet Take 1-2 tablets by mouth daily as needed for severe pain. 60 tablet 0   levothyroxine (SYNTHROID)  112 MCG tablet TAKE ONE TABLET BY MOUTH ONE TIME DAILY BEFORE BREAKFAST 90 tablet 3   losartan (COZAAR) 100 MG tablet Take 1 tablet (100 mg total) by mouth daily. 90 tablet 3   Magnesium 250 MG TABS Take 1 tablet by mouth daily.     pantoprazole (PROTONIX) 40 MG tablet TAKE ONE TABLET BY MOUTH EVERY MORNING 90 tablet 0   potassium chloride SA (KLOR-CON M) 20 MEQ tablet Take 40 MEQ with use of Lasix 20 mg to prevent low potassium 180 tablet 0   zolpidem (AMBIEN) 10 MG tablet Take 1 tablet (10 mg total) by mouth at bedtime. 30 tablet 5   carvedilol (COREG) 12.5 MG tablet Take 1.5 tablets (18.75 mg total) by mouth 2 (two) times daily with a meal. 90  tablet 4   No current facility-administered medications for this visit.     Review of Systems    Significant improvement in lower extremity swelling-more or less resolved.  Had itching on Lasix but this has resolved.  She denies chest pain, dyspnea, palpitations, PND, orthopnea, dizziness, syncope, or early satiety.  All other systems reviewed and are otherwise negative except as noted above.    Physical Exam    VS:  BP (!) 142/70 (BP Location: Right Arm, Patient Position: Sitting)   Pulse 72   Ht 5\' 4"  (1.626 m)   Wt 200 lb 3.2 oz (90.8 kg)   SpO2 98%   BMI 34.36 kg/m  , BMI Body mass index is 34.36 kg/m.     Vitals:   10/30/22 1329 10/30/22 1416  BP: (!) 142/70 (!) 146/59  Pulse: 72   SpO2: 98%     GEN: Well nourished, well developed, in no acute distress. HEENT: normal. Neck: Supple, no JVD, carotid bruits, or masses. Cardiac: RRR, no murmurs, rubs, or gallops. No clubbing, cyanosis, edema.  Radials 2+/PT 2+ and equal bilaterally.  Respiratory:  Respirations regular and unlabored, clear to auscultation bilaterally. GI: Soft, nontender, nondistended, BS + x 4. MS: no deformity or atrophy. Skin: warm and dry, no rash. Barnes:  Strength and sensation are intact. Psych: Normal affect.  Accessory Clinical Findings   Lab Results   Component Value Date   WBC 15.0 (H) 08/04/2022   HGB 14.1 08/04/2022   HCT 42.3 08/04/2022   MCV 92.8 08/04/2022   PLT 293 08/04/2022   Lab Results  Component Value Date   CREATININE 1.13 (H) 09/17/2022   BUN 24 09/17/2022   NA 139 09/17/2022   K 3.7 09/17/2022   CL 100 09/17/2022   CO2 22 09/17/2022   Lab Results  Component Value Date   ALT 21 07/30/2022   AST 22 07/30/2022   ALKPHOS 106 07/30/2022   BILITOT 0.3 07/30/2022   Lab Results  Component Value Date   CHOL 187 07/30/2022   HDL 48 07/30/2022   LDLCALC 112 (H) 07/30/2022   TRIG 155 (H) 07/30/2022   CHOLHDL 3.9 07/30/2022    Lab Results  Component Value Date   HGBA1C 6.4 (H) 07/30/2022    Assessment & Plan    1.  Dependent edema/diastolic dysfunction: Recent progression of lower extremity swelling with intolerance to HCTZ and more recently Lasix (itching).  At her last visit, we discontinued amlodipine therapy and she also started using compression socks.  Echo with normal LV function and grade 1 diastolic dysfunction.  Lower extremity swelling essentially resolved, and she has no edema today.  She sometimes notes mild swelling between her lateral malleolus and heel but overall, she has noted significant improvement.  She will continue to use compression socks and is been more careful with salt in her diet.  I have added amlodipine and Lasix to her intolerances.  2.  Essential hypertension: She has checked blood pressure at home off of amlodipine and trends have been somewhat variable.  Pressure higher today at 142/70.  Increasing carvedilol to 18.75 mg twice daily.  3.  Hyperlipidemia: She remains on Zetia with an LDL of 112 and April.  LFTs normal at that time.  4.  Stage III chronic kidney disease: Creatinine of 1.13 in May in the setting of diuretic therapy.  Now off of Lasix altogether.  Continue ARB.  5.  Disposition: Follow-up in 3 months or sooner if necessary.  Informed Consent  Nicolasa Ducking,  NP 10/30/2022, 2:10 PM

## 2022-10-30 NOTE — Patient Instructions (Signed)
Medication Instructions:  INCREASE the Carvedilol to 18.75 mg twice daily (one and a half tablets)   *If you need a refill on your cardiac medications before your next appointment, please call your pharmacy*   Lab Work: None ordered If you have labs (blood work) drawn today and your tests are completely normal, you will receive your results only by: MyChart Message (if you have MyChart) OR A paper copy in the mail If you have any lab test that is abnormal or we need to change your treatment, we will call you to review the results.   Testing/Procedures: None ordered   Follow-Up: At [facility], you and your health needs are our priority.  As part of our continuing mission to provide you with exceptional heart care, we have created designated Provider Care Teams.  These Care Teams include your primary Cardiologist (physician) and Advanced Practice Providers (APPs -  Physician Assistants and Nurse Practitioners) who all work together to provide you with the care you need, when you need it.  We recommend signing up for the patient portal called "MyChart".  Sign up information is provided on this After Visit Summary.  MyChart is used to connect with patients for Virtual Visits (Telemedicine).  Patients are able to view lab/test results, encounter notes, upcoming appointments, etc.  Non-urgent messages can be sent to your provider as well.   To learn more about what you can do with MyChart, go to [email or web address].    Your next appointment:   3 month(s)  Provider:   You may see Lorine Bears, MD or one of the following Advanced Practice Providers on your designated Care Team:   Nicolasa Ducking, NP

## 2022-11-09 DIAGNOSIS — Z85828 Personal history of other malignant neoplasm of skin: Secondary | ICD-10-CM | POA: Diagnosis not present

## 2022-11-09 DIAGNOSIS — D225 Melanocytic nevi of trunk: Secondary | ICD-10-CM | POA: Diagnosis not present

## 2022-11-09 DIAGNOSIS — L821 Other seborrheic keratosis: Secondary | ICD-10-CM | POA: Diagnosis not present

## 2022-12-11 ENCOUNTER — Ambulatory Visit
Admission: RE | Admit: 2022-12-11 | Discharge: 2022-12-11 | Disposition: A | Discharge status: Home / Self Care | Payer: Medicare PPO | Source: Ambulatory Visit | Attending: Family Medicine | Admitting: Family Medicine

## 2022-12-11 ENCOUNTER — Telehealth: Payer: Self-pay | Admitting: Gastroenterology

## 2022-12-11 DIAGNOSIS — Z780 Asymptomatic menopausal state: Secondary | ICD-10-CM | POA: Diagnosis not present

## 2022-12-11 DIAGNOSIS — M8589 Other specified disorders of bone density and structure, multiple sites: Secondary | ICD-10-CM | POA: Diagnosis not present

## 2022-12-11 DIAGNOSIS — Z1231 Encounter for screening mammogram for malignant neoplasm of breast: Secondary | ICD-10-CM | POA: Insufficient documentation

## 2022-12-11 DIAGNOSIS — Z13820 Encounter for screening for osteoporosis: Secondary | ICD-10-CM | POA: Diagnosis not present

## 2022-12-11 DIAGNOSIS — M8588 Other specified disorders of bone density and structure, other site: Secondary | ICD-10-CM | POA: Diagnosis not present

## 2022-12-11 MED ORDER — PANTOPRAZOLE SODIUM 40 MG PO TBEC: 40.0000 mg | DELAYED_RELEASE_TABLET | Freq: Every morning | ORAL | 0 refills | Status: DC

## 2022-12-11 NOTE — Telephone Encounter (Signed)
Prescription sent to patient's pharmacy until scheduled appt. 

## 2022-12-11 NOTE — Telephone Encounter (Signed)
Inbound call from patient stating that she needs a refill for Protonix. Patient was scheduled for 11/11 at 2:10

## 2022-12-12 ENCOUNTER — Other Ambulatory Visit: Payer: Self-pay | Admitting: Gastroenterology

## 2022-12-15 NOTE — Progress Notes (Signed)
Hi Devynne  Normal mammogram; repeat in 1 year.  Please let us know if you have any questions.  Thank you,  Elise Payne, FNP 

## 2022-12-17 ENCOUNTER — Encounter: Payer: Self-pay | Admitting: Family Medicine

## 2022-12-17 ENCOUNTER — Ambulatory Visit: Payer: Medicare PPO | Admitting: Family Medicine

## 2022-12-17 VITALS — BP 136/70 | HR 68 | Temp 98.2°F | Resp 16 | Ht 64.0 in | Wt 200.0 lb

## 2022-12-17 DIAGNOSIS — R7303 Prediabetes: Secondary | ICD-10-CM

## 2022-12-17 DIAGNOSIS — E7800 Pure hypercholesterolemia, unspecified: Secondary | ICD-10-CM

## 2022-12-17 DIAGNOSIS — M8589 Other specified disorders of bone density and structure, multiple sites: Secondary | ICD-10-CM | POA: Diagnosis not present

## 2022-12-17 DIAGNOSIS — I10 Essential (primary) hypertension: Secondary | ICD-10-CM | POA: Diagnosis not present

## 2022-12-17 DIAGNOSIS — E559 Vitamin D deficiency, unspecified: Secondary | ICD-10-CM

## 2022-12-17 DIAGNOSIS — Z0000 Encounter for general adult medical examination without abnormal findings: Secondary | ICD-10-CM

## 2022-12-17 DIAGNOSIS — E034 Atrophy of thyroid (acquired): Secondary | ICD-10-CM | POA: Diagnosis not present

## 2022-12-17 DIAGNOSIS — C9110 Chronic lymphocytic leukemia of B-cell type not having achieved remission: Secondary | ICD-10-CM | POA: Diagnosis not present

## 2022-12-17 DIAGNOSIS — Z23 Encounter for immunization: Secondary | ICD-10-CM

## 2022-12-17 DIAGNOSIS — Z0001 Encounter for general adult medical examination with abnormal findings: Secondary | ICD-10-CM

## 2022-12-17 DIAGNOSIS — K529 Noninfective gastroenteritis and colitis, unspecified: Secondary | ICD-10-CM | POA: Diagnosis not present

## 2022-12-17 DIAGNOSIS — K5792 Diverticulitis of intestine, part unspecified, without perforation or abscess without bleeding: Secondary | ICD-10-CM

## 2022-12-17 DIAGNOSIS — N1831 Chronic kidney disease, stage 3a: Secondary | ICD-10-CM

## 2022-12-17 MED ORDER — AMOXICILLIN-POT CLAVULANATE 875-125 MG PO TABS: 1.0000 | ORAL_TABLET | Freq: Three times a day (TID) | ORAL | 0 refills | Status: DC

## 2022-12-17 NOTE — Assessment & Plan Note (Signed)
Chronic, borderline Continue coreg 18.75 mg BID, losartan 100 mg

## 2022-12-17 NOTE — Assessment & Plan Note (Signed)
[facility] dental and vision No hearing complaints Chronic pain wax/wanes Things to do to keep yourself healthy  - Exercise at least 30-45 minutes a day, 3-4 days a week.  - Eat a low-fat diet with lots of fruits and vegetables, up to 7-9 servings per day.  - Seatbelts can save your life. Wear them always.  - Smoke detectors on every level of your home, check batteries every year.  - Eye Doctor - have an eye exam every 1-2 years  - Safe sex - if you may be exposed to STDs, use a condom.  - Alcohol -  If you drink, do it moderately, less than 2 drinks per day.  - Health Care Power of Attorney. Choose someone to speak for you if you are not able.  - Depression is common in our stressful world.If you're feeling down or losing interest in things you normally enjoy, please come in for a visit.  - Violence - If anyone is threatening or hurting you, please call immediately.

## 2022-12-17 NOTE — Assessment & Plan Note (Signed)
Chronic, repeat LP given ongoing GI complaints and use of zetia 10, Colestid 1g BID as well as heart healthy diet

## 2022-12-17 NOTE — Progress Notes (Signed)
Complete physical exam  [name]   DOB: May 10, 1940   82 y.o. Female  MRN: [id number] Visit Date: 12/17/2022  Today's healthcare provider: Jacky Kindle, FNP  Re-introduced to nurse practitioner role and practice setting.  All questions answered.  Discussed provider/patient relationship and expectations.  Chief Complaint  Patient presents with   Annual Exam   Subjective    Cathy Barnes is a 82 y.o. female who presents today for a complete physical exam.  She reports consuming a general diet. The patient does not participate in regular exercise at present. She generally feels fairly well. She reports sleeping fairly well. She does have additional problems to discuss today.   HPI  Patient reports worsening GI complaints since June. LLQ pain present with known diverticula disease on last colon cancer screening; cannot be seen by her GI until 11/11, however, she is on a cancelled appt call list to assist in getting an earlier appt.   Past Medical History:  Diagnosis Date   (HFpEF) heart failure with preserved ejection fraction (HCC)    a. 08/2019 Echo: EF 55-60%, no rwma, Gr1 DD, nl RV size/fxn; b. 09/2022 Echo: Ef 60-65%, no rwma, GrI Dd, nl RV fxn, mildly dil LA. no significant valvular disease.   Allergy    some   Cataract    bilaterally both eyes    Chest pain    a. 07/2019 MV: EF 77%, no ischemia/infarct.   CKD (chronic kidney disease), stage III (HCC)    CLL (chronic lymphocytic leukemia) (HCC)    stage 0   Gallstones    GERD (gastroesophageal reflux disease)    H/O blood clots    Hydrocephalus (HCC)    [facility]    Hyperglycemia    Hyperlipidemia    Hypertension    a. 03/2022 Renal Duplex: No RAS.   Lymphocytosis    monoclonal b cell   Migraine, unspecified, without mention of intractable migraine without mention of status migrainosus 12/14/2012   Osteoarthritis, chronic    Osteoporosis    Peptic ulcer    some bleeding with ulcers as well     Past Surgical History:  Procedure Laterality Date   ABDOMINAL HYSTERECTOMY     BREAST CYST ASPIRATION Left 1993   Removed   breast cyst removed Right    benign    BREAST LUMPECTOMY Bilateral    COLONOSCOPY     ERCP N/A 02/04/2018   Procedure: ENDOSCOPIC RETROGRADE CHOLANGIOPANCREATOGRAPHY (ERCP);  Surgeon: Midge Minium, MD;  Location: [facility] ENDOSCOPY;  Service: Endoscopy;  Laterality: N/A;   ERCP N/A 02/28/2018   Procedure: ENDOSCOPIC RETROGRADE CHOLANGIOPANCREATOGRAPHY (ERCP) STENT REMOVAL;  Surgeon: Midge Minium, MD;  Location: [facility] ENDOSCOPY;  Service: Endoscopy;  Laterality: N/A;   EYE SURGERY     growth removal Left 2019   L wrist growth removed, abnormal cells   History of Cataract surgery Right 2011   left 2011   PARTIAL HYSTERECTOMY     POLYPECTOMY     ROBOTIC ASSISTED LAPAROSCOPIC CHOLECYSTECTOMY-MULTI SITE N/A 03/08/2018   Procedure: ROBOTIC ASSISTED LAPAROSCOPIC CHOLECYSTECTOMY-MULTI SITE;  Surgeon: Leafy Ro, MD;  Location: [facility] ORS;  Service: General;  Laterality: N/A;   S/P ACL repair Left    knee   TONSILLECTOMY     TUBAL LIGATION  1975   Bilateral   Social History   Socioeconomic History   Marital status: Divorced    Spouse name: Not on file   Number of children: 1  Years of education: college   Highest education level: Some college, no degree  Occupational History   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 Divorced.      Social Determinants of  Health   Financial Resource Strain: Low Risk  (12/16/2022)   Overall Financial Resource Strain (CARDIA)    Difficulty of Paying Living Expenses: Not hard at all  Food Insecurity: No Food Insecurity (12/16/2022)   Hunger Vital Sign    Worried About Running Out of Food in the Last Year: Never true    Ran Out of Food in the Last Year: Never true  Transportation Needs: No Transportation Needs (12/16/2022)   PRAPARE - Administrator, Civil Service (Medical): No    Lack of Transportation (Non-Medical): No  Physical Activity: Inactive (12/16/2022)   Exercise Vital Sign    Days of Exercise per Week: 0 days    Minutes of Exercise per Session: 0 min  Stress: Stress Concern Present (12/16/2022)   [employer] of Occupational Health - Occupational Stress Questionnaire    Feeling of Stress : To some extent  Social Connections: Moderately Isolated (12/16/2022)   Social Connection and Isolation Panel [NHANES]    Frequency of Communication with Friends and Family: More than three times a week    Frequency of Social Gatherings with Friends and Family: Twice a week    Attends Religious Services: 1 to 4 times per year    Active Member of [employer] or Organizations: No    Attends Banker Meetings: Never    Marital Status: Divorced  Catering manager Violence: Not At Risk (08/05/2022)   Humiliation, Afraid, Rape, and Kick questionnaire    Fear of Current or Ex-Partner: No    Emotionally Abused: No    Physically Abused: No    Sexually Abused: No   Family Status  Relation Name Status   Mother  Deceased at age 52       colon cancer   Father  Deceased at age 19       colon cancer   MGM  Deceased       Died from old age   PGM  Deceased at age 67       died from old age   PGF  Deceased       Brain Tumor   Neg Hx  (Not Specified)  No partnership data on file   Family History  Problem Relation Age of Onset   Colon cancer Mother    Hypertension Mother    Colon cancer Father     Heart disease Father    Hypertension Father    Colon polyps Neg Hx    Rectal cancer Neg Hx    Stomach cancer Neg Hx    Esophageal cancer Neg Hx    Allergies  Allergen Reactions   Shellfish-Derived Products Anaphylaxis    Throat  closes, rash   Hydrochlorothiazide Diarrhea    Microzide branding    Amlodipine     Lower ext edema   Lasix [Furosemide]     Profound itching   Shellfish Allergy     Throat closes, rash   Latex Rash    Tapes,adhesives    Patient Care Team: Jacky Kindle, FNP as PCP - General (Family Medicine) Iran Ouch, MD as PCP - Cardiology (Cardiology) Mateo Flow, MD as Consulting Physician (Ophthalmology) Anson Fret, MD as Consulting Physician (Neurology) Creig Hines, MD as Consulting Physician (Oncology)   Medications: Outpatient Medications Prior to Visit  Medication Sig   ALPRAZolam (XANAX) 0.25 MG tablet Take 1 tablet (0.25 mg total) by mouth at bedtime.   carvedilol (COREG) 12.5 MG tablet Take 1.5 tablets (18.75 mg total) by mouth 2 (two) times daily with a meal.   Cholecalciferol (VITAMIN D3) 1.25 MG (50000 UT) CAPS TAKE ONE CAPSULE BY MOUTH ONCE A WEEK   colestipol (COLESTID) 1 g tablet Take 1 tablet (1 g total) by mouth 2 (two) times daily.   cyclobenzaprine (FLEXERIL) 10 MG tablet Take 1 tablet (10 mg total) by mouth at bedtime as needed for muscle spasms.   donepezil (ARICEPT) 10 MG tablet TAKE ONE TABLET BY MOUTH AT BEDTIME   ezetimibe (ZETIA) 10 MG tablet Take 1 tablet (10 mg total) by mouth at bedtime.   gabapentin (NEURONTIN) 600 MG tablet Take 1 tablet (600 mg total) by mouth 2 (two) times daily.   HYDROcodone-acetaminophen (NORCO/VICODIN) 5-325 MG tablet Take 1-2 tablets by mouth daily as needed for severe pain.   levothyroxine (SYNTHROID) 112 MCG tablet TAKE ONE TABLET BY MOUTH ONE TIME DAILY BEFORE BREAKFAST   losartan (COZAAR) 100 MG tablet Take 1 tablet (100 mg total) by mouth daily.   Magnesium 250 MG TABS Take 1  tablet by mouth daily.   pantoprazole (PROTONIX) 40 MG tablet Take 1 tablet (40 mg total) by mouth every morning.   potassium chloride SA (KLOR-CON M) 20 MEQ tablet Take 40 MEQ with use of Lasix 20 mg to prevent low potassium   zolpidem (AMBIEN) 10 MG tablet Take 1 tablet (10 mg total) by mouth at bedtime.   No facility-administered medications prior to visit.    Review of Systems   Objective    BP 136/70 (BP Location: Left Arm, Patient Position: Sitting, Cuff Size: Large)   Pulse 68   Temp 98.2 F (36.8 C) (Temporal)   Resp 16   Ht 5\' 4"  (1.626 m)   Wt 200 lb (90.7 kg)   SpO2 96%   BMI 34.33 kg/m   Physical Exam Vitals and nursing note reviewed.  Constitutional:      General: She is awake. She is not in acute distress.    Appearance: Normal appearance. She is well-developed and well-groomed. She is obese. She is not ill-appearing, toxic-appearing or diaphoretic.  HENT:     Head: Normocephalic and atraumatic.     Jaw: There is normal jaw occlusion. No trismus, tenderness, swelling or pain on movement.     Right Ear: Hearing, tympanic membrane, ear canal and external ear normal. There is no impacted cerumen.     Left Ear: Hearing, tympanic membrane, ear canal and external ear normal. There is no impacted cerumen.     Nose: Nose normal. No congestion or rhinorrhea.     Right Turbinates: Not enlarged, swollen or pale.     Left Turbinates: Not enlarged, swollen or pale.  Right Sinus: No maxillary sinus tenderness or frontal sinus tenderness.     Left Sinus: No maxillary sinus tenderness or frontal sinus tenderness.     Mouth/Throat:     Lips: Pink.     Mouth: Mucous membranes are moist. No injury.     Tongue: No lesions.     Pharynx: Oropharynx is clear. Uvula midline. No pharyngeal swelling, oropharyngeal exudate, posterior oropharyngeal erythema or uvula swelling.     Tonsils: No tonsillar exudate or tonsillar abscesses.  Eyes:     General: Lids are normal. Lids are  everted, no foreign bodies appreciated. Vision grossly intact. Gaze aligned appropriately. No allergic shiner or visual field deficit.       Right eye: No discharge.        Left eye: No discharge.     Extraocular Movements: Extraocular movements intact.     Conjunctiva/sclera: Conjunctivae normal.     Right eye: Right conjunctiva is not injected. No exudate.    Left eye: Left conjunctiva is not injected. No exudate.    Pupils: Pupils are equal, round, and reactive to light.  Neck:     Thyroid: No thyroid mass, thyromegaly or thyroid tenderness.     Vascular: No carotid bruit.     Trachea: Trachea normal.  Cardiovascular:     Rate and Rhythm: Normal rate and regular rhythm.     Pulses: Normal pulses.          Carotid pulses are 2+ on the right side and 2+ on the left side.      Radial pulses are 2+ on the right side and 2+ on the left side.       Dorsalis pedis pulses are 2+ on the right side and 2+ on the left side.       Posterior tibial pulses are 2+ on the right side and 2+ on the left side.     Heart sounds: Normal heart sounds, S1 normal and S2 normal. No murmur heard.    No friction rub. No gallop.  Pulmonary:     Effort: Pulmonary effort is normal. No respiratory distress.     Breath sounds: Normal breath sounds and air entry. No stridor. No wheezing, rhonchi or rales.  Chest:     Chest wall: No tenderness.  Abdominal:     General: Abdomen is flat. Bowel sounds are normal. There is no distension.     Palpations: Abdomen is soft. There is no mass.     Tenderness: There is abdominal tenderness. There is no right CVA tenderness, left CVA tenderness, guarding or rebound.     Hernia: No hernia is present.  Genitourinary:    Comments: Exam deferred; denies complaints Musculoskeletal:        General: No swelling, tenderness, deformity or signs of injury. Normal range of motion.     Cervical back: Full passive range of motion without pain, normal range of motion and neck supple. No  edema, rigidity or tenderness. No muscular tenderness.     Right lower leg: No edema.     Left lower leg: No edema.  Lymphadenopathy:     Cervical: No cervical adenopathy.     Right cervical: No superficial, deep or posterior cervical adenopathy.    Left cervical: No superficial, deep or posterior cervical adenopathy.  Skin:    General: Skin is warm and dry.     Capillary Refill: Capillary refill takes less than 2 seconds.     Coloration: Skin is not jaundiced or pale.  Findings: No bruising, erythema, lesion or rash.  Neurological:     General: No focal deficit present.     Mental Status: She is alert and oriented to person, place, and time. Mental status is at baseline.     GCS: GCS eye subscore is 4. GCS verbal subscore is 5. GCS motor subscore is 6.     Sensory: Sensation is intact. No sensory deficit.     Motor: Motor function is intact. No weakness.     Coordination: Coordination is intact. Coordination normal.     Gait: Gait is intact. Gait normal.  Psychiatric:        Attention and Perception: Attention and perception normal.        Mood and Affect: Mood and affect normal.        Speech: Speech normal.        Behavior: Behavior normal. Behavior is cooperative.        Thought Content: Thought content normal.        Cognition and Memory: Cognition and memory normal.        Judgment: Judgment normal.     Last depression screening scores    12/17/2022    1:13 PM 09/24/2022   11:06 AM 09/17/2022    1:13 PM  PHQ 2/9 Scores  PHQ - 2 Score 1 1 1   PHQ- 9 Score 3 2 6    Last fall risk screening    12/17/2022    1:13 PM  Fall Risk   Falls in the past year? 0  Number falls in past yr: 0  Injury with Fall? 0  Risk for fall due to : No Fall Risks  Follow up Falls evaluation completed   Last Audit-C alcohol use screening    12/16/2022    8:58 AM  Alcohol Use Disorder Test (AUDIT)  1. How often do you have a drink containing alcohol? 2  2. How many drinks containing  alcohol do you have on a typical day when you are drinking? 0  3. How often do you have six or more drinks on one occasion? 0  AUDIT-C Score 2   A score of 3 or more in women, and 4 or more in men indicates increased risk for alcohol abuse, EXCEPT if all of the points are from question 1   No results found for any visits on 12/17/22.  Assessment & Plan    Routine Health Maintenance and Physical Exam  Exercise Activities and Dietary recommendations  Goals      DIET - EAT MORE FRUITS AND VEGETABLES     Increase water intake     Recommend increasing water intake to 3 glasses a day.        Immunization History  Administered Date(s) Administered   Fluad Quad(high Dose 65+) 01/28/2021, 01/05/2022   Fluad Trivalent(High Dose 65+) 12/17/2022   Influenza Split 01/09/2010, 12/24/2011   Influenza, High Dose Seasonal PF 01/07/2014, 01/03/2018, 12/13/2018   Influenza-Unspecified 02/01/2017   PFIZER(Purple Top)SARS-COV-2 Vaccination 05/10/2019, 06/03/2019, 03/04/2020   Pneumococcal Conjugate-13 06/25/2014   Pneumococcal Polysaccharide-23 05/04/2011   Td 04/18/2004   Tdap 11/09/2012    Health Maintenance  Topic Date Due   Zoster Vaccines- Shingrix (1 of 2) Never done   COVID-19 Vaccine (4 - 2023-24 season) 12/19/2021   DTaP/Tdap/Td (3 - Td or Tdap) 11/10/2022   Medicare Annual Wellness (AWV)  08/05/2023   MAMMOGRAM  12/11/2023   Colonoscopy  09/10/2024   DEXA SCAN  12/11/2027   Pneumonia Vaccine 65+ Years  old  Completed   INFLUENZA VACCINE  Completed   HPV VACCINES  Aged Out   Hepatitis C Screening  Discontinued    Discussed health benefits of physical activity, and encouraged her to engage in regular exercise appropriate for her age and condition.  Problem List Items Addressed This Visit       Cardiovascular and Mediastinum   Essential (primary) hypertension    Chronic, borderline Continue coreg 18.75 mg BID, losartan 100 mg       Relevant Orders   Comprehensive  Metabolic Panel (CMET)   CBC   TSH     Digestive   Chronic diarrhea of unknown origin    Worsening since June; not improved with previous trial of Colestid or imodium Has modified diet Denies concerns for water or food contamination; lives with daughter and grandson who are both without concerns LLQ tender; recommend treatment for diverticula and will recommend we get amylase and GGT given stool reported color       Relevant Orders   Amylase   Gamma GT     Endocrine   Hypothyroidism due to acquired atrophy of thyroid    Chronic, previously stable Repeat labs given GI complaints Previously on synthroid 112 mcg       Relevant Orders   TSH     Musculoskeletal and Integument   Osteopenia of multiple sites    Chronic, stable Remains on Vit D supplement Unable to walk given concerns for chronic back pain      Relevant Orders   Vitamin D (25 hydroxy)     Genitourinary   Stage 3a chronic kidney disease (HCC)    Chronic, stable Repeat CMP given GI losses for concern for pre renal fluid status possibly worsening CKD Bp remains stable       Relevant Orders   Comprehensive Metabolic Panel (CMET)     Other   Annual physical exam - Primary    UTD dental and vision No hearing complaints Chronic pain wax/wanes Things to do to keep yourself healthy  - Exercise at least 30-45 minutes a day, 3-4 days a week.  - Eat a low-fat diet with lots of fruits and vegetables, up to 7-9 servings per day.  - Seatbelts can save your life. Wear them always.  - Smoke detectors on every level of your home, check batteries every year.  - Eye Doctor - have an eye exam every 1-2 years  - Safe sex - if you may be exposed to STDs, use a condom.  - Alcohol -  If you drink, do it moderately, less than 2 drinks per day.  - Health Care Power of Attorney. Choose someone to speak for you if you are not able.  - Depression is common in our stressful world.If you're feeling down or losing interest in  things you normally enjoy, please come in for a visit.  - Violence - If anyone is threatening or hurting you, please call immediately.       Avitaminosis D    Chronic, on supplementation Repeat labs given known osteopenia       Relevant Orders   Vitamin D (25 hydroxy)   CLL (chronic lymphocytic leukemia) (HCC)    Chronic; stable Repeat CBC at this time Pt followed by oncology       Relevant Medications   amoxicillin-clavulanate (AUGMENTIN) 875-125 MG tablet   Diverticulitis    Acute tenderness on abdominal exam to LLQ Recommend treatment with ABX at this time Continue to monitor s/s  Encounter for immunization   Relevant Orders   Flu Vaccine Trivalent High Dose (Fluad) (Completed)   Hypercholesterolemia    Chronic, repeat LP given ongoing GI complaints and use of zetia 10, Colestid 1g BID as well as heart healthy diet        Relevant Orders   Lipid panel   Prediabetes    Chronic, stable Repeat labs Continue to recommend balanced, lower carb meals. Smaller meal size, adding snacks. Choosing water as drink of choice and increasing purposeful exercise.       Relevant Orders   Hemoglobin A1c   Return in about 3 months (around 03/19/2023), or if symptoms worsen or fail to improve, for chonic disease management.    Leilani Merl, FNP, have reviewed all documentation for this visit. The documentation on 12/17/22 for the exam, diagnosis, procedures, and orders are all accurate and complete.  Jacky Kindle, FNP  [facility] [phone] (phone) [phone] (fax)  [facility]

## 2022-12-17 NOTE — Assessment & Plan Note (Signed)
Chronic, stable Repeat CMP given GI losses for concern for pre renal fluid status possibly worsening CKD Bp remains stable

## 2022-12-17 NOTE — Assessment & Plan Note (Signed)
Chronic, stable Repeat labs Continue to recommend balanced, lower carb meals. Smaller meal size, adding snacks. Choosing water as drink of choice and increasing purposeful exercise.

## 2022-12-17 NOTE — Assessment & Plan Note (Signed)
Acute tenderness on abdominal exam to LLQ Recommend treatment with ABX at this time Continue to monitor s/s

## 2022-12-17 NOTE — Assessment & Plan Note (Signed)
Chronic, on supplementation Repeat labs given known osteopenia

## 2022-12-17 NOTE — Assessment & Plan Note (Signed)
Chronic, stable Remains on Vit D supplement Unable to walk given concerns for chronic back pain

## 2022-12-17 NOTE — Assessment & Plan Note (Signed)
Chronic, previously stable Repeat labs given GI complaints Previously on synthroid 112 mcg

## 2022-12-17 NOTE — Assessment & Plan Note (Signed)
Chronic; stable Repeat CBC at this time Pt followed by oncology

## 2022-12-17 NOTE — Assessment & Plan Note (Signed)
Worsening since June; not improved with previous trial of Colestid or imodium Has modified diet Denies concerns for water or food contamination; lives with daughter and grandson who are both without concerns LLQ tender; recommend treatment for diverticula and will recommend we get amylase and GGT given stool reported color

## 2022-12-17 NOTE — Patient Instructions (Signed)
The CDC recommends two doses of Shingrix (the new shingles vaccine) separated by 2 to 6 months for adults age 81 years and older. I recommend checking with your insurance plan regarding coverage for this vaccine.    

## 2022-12-18 ENCOUNTER — Other Ambulatory Visit: Payer: Self-pay

## 2022-12-18 DIAGNOSIS — E1169 Type 2 diabetes mellitus with other specified complication: Secondary | ICD-10-CM

## 2022-12-18 LAB — LIPID PANEL
Chol/HDL Ratio: 4.2 ratio (ref 0.0–4.4)
Cholesterol, Total: 174 mg/dL (ref 100–199)
HDL: 41 mg/dL (ref >39)
LDL Chol Calc (NIH): 94 mg/dL (ref 0–99)
Triglycerides: 232 mg/dL — ABNORMAL HIGH (ref 0–149)
VLDL Cholesterol Cal: 39 mg/dL (ref 5–40)

## 2022-12-18 LAB — COMPREHENSIVE METABOLIC PANEL
ALT: 24 IU/L (ref 0–32)
AST: 24 IU/L (ref 0–40)
Albumin: 4 g/dL (ref 3.8–4.8)
Alkaline Phosphatase: 112 IU/L (ref 44–121)
BUN/Creatinine Ratio: 16 (ref 12–28)
BUN: 18 mg/dL (ref 8–27)
Bilirubin Total: 0.3 mg/dL (ref 0.0–1.2)
CO2: 23 mmol/L (ref 20–29)
Calcium: 9.5 mg/dL (ref 8.7–10.3)
Chloride: 105 mmol/L (ref 96–106)
Creatinine, Ser: 1.16 mg/dL — ABNORMAL HIGH (ref 0.57–1.00)
Globulin, Total: 2.6 g/dL (ref 1.5–4.5)
Glucose: 121 mg/dL — ABNORMAL HIGH (ref 70–99)
Potassium: 4.7 mmol/L (ref 3.5–5.2)
Sodium: 143 mmol/L (ref 134–144)
Total Protein: 6.6 g/dL (ref 6.0–8.5)
eGFR: 48 mL/min/{1.73_m2} — ABNORMAL LOW (ref >59)

## 2022-12-18 LAB — CBC
Hematocrit: 43.3 % (ref 34.0–46.6)
Hemoglobin: 14.4 g/dL (ref 11.1–15.9)
MCH: 29.6 pg (ref 26.6–33.0)
MCHC: 33.3 g/dL (ref 31.5–35.7)
MCV: 89 fL (ref 79–97)
Platelets: 203 10*3/uL (ref 150–450)
RBC: 4.86 x10E6/uL (ref 3.77–5.28)
RDW: 12.9 % (ref 11.7–15.4)
WBC: 15.2 10*3/uL — ABNORMAL HIGH (ref 3.4–10.8)

## 2022-12-18 LAB — HEMOGLOBIN A1C
Est. average glucose Bld gHb Est-mCnc: 140 mg/dL
Hgb A1c MFr Bld: 6.5 % — ABNORMAL HIGH (ref 4.8–5.6)

## 2022-12-18 LAB — TSH: TSH: 2.19 u[IU]/mL (ref 0.450–4.500)

## 2022-12-18 LAB — VITAMIN D 25 HYDROXY (VIT D DEFICIENCY, FRACTURES): Vit D, 25-Hydroxy: 73.8 ng/mL (ref 30.0–100.0)

## 2022-12-18 LAB — GAMMA GT: GGT: 24 IU/L (ref 0–60)

## 2022-12-18 LAB — AMYLASE: Amylase: 22 U/L — ABNORMAL LOW (ref 31–110)

## 2022-12-18 MED ORDER — ROSUVASTATIN CALCIUM 10 MG PO TABS
10.0000 mg | ORAL_TABLET | Freq: Every day | ORAL | 1 refills | Status: DC
Start: 2022-12-18 — End: 2023-06-24

## 2022-12-18 NOTE — Progress Notes (Signed)
Patient advised. Verbalized understanding 

## 2022-12-18 NOTE — Progress Notes (Signed)
Please call per pt request -A1c has increased; pt is now diabetic -Cholesterol is improved; LDL recommendation is now <16 given diabetic. Recommend new trial of Crestor 10 mg to assist Zetia as well as lower fat diet and exercise as able -All other labs are stable *recommend GI follow up as previously planned

## 2022-12-25 ENCOUNTER — Other Ambulatory Visit: Payer: Medicare PPO

## 2022-12-25 ENCOUNTER — Ambulatory Visit: Payer: Medicare PPO | Admitting: Gastroenterology

## 2022-12-25 ENCOUNTER — Encounter: Payer: Self-pay | Admitting: Gastroenterology

## 2022-12-25 VITALS — BP 136/78 | HR 65 | Ht 64.0 in | Wt 197.1 lb

## 2022-12-25 DIAGNOSIS — R197 Diarrhea, unspecified: Secondary | ICD-10-CM | POA: Diagnosis not present

## 2022-12-25 DIAGNOSIS — R194 Change in bowel habit: Secondary | ICD-10-CM

## 2022-12-25 NOTE — Progress Notes (Signed)
Chief Complaint: Diarrhea Primary GI MD: Dr. Russella Dar  HPI: 82 year old female history of CLL, hypertension, presents for evaluation of diarrhea.  Last seen by Dr. Russella Dar 08/2021 for iron deficiency without anemia (ferritin 10).  Underwent EGD/colonoscopy as below.  Seen by PCP 09/24/22 for acute onset diarrhea after increasing dose of hydrochlorothiazide. PCP recommended stopping medication and noted that diarrhea improved but has not resolved.   Recent lab work with PCP 12/17/2022 - WBC 15.2 (appears baseline since 2021 at least) - Hgb 14.4, MCV 89 - BUN 18, creatinine 1.16 - Normal LFTs - Normal GGT - Amylase 22 - TSH 2.19  Patient states she has had diarrhea ongoing since early June when her HCTZ was increased.  States it has gotten worse over the last few weeks.  Reports 3-5 bowel movements per day that are loose/mushy.  Denies abdominal pain.  States sometimes her stools are yellowish. Denies steatorrhea.   Has tried colestipol with no relief.  Recently just completed a course of Augmentin for suspected diverticulitis as she said her PCP was concerned for diverticulitis with her elevated white count and change in bowel habits.  She states Augmentin made her symptoms significantly worse and has increased her bowel urgency and frequency.  Patient has baseline leukocytosis of around 15.  Patient does report having antibiotics earlier this year.  Patient denies abdominal pain.  Denies nausea/vomiting.  Denies melena/hematochezia.  Denies NSAID use.  Family history of colon cancer in both her mother and her father.   PREVIOUS GI WORKUP   EGD 09/10/21 for IDA  - LA Grade B reflux esophagitis with no bleeding.  - Erythematous mucosa in the gastric body and antrum. Biopsied.  - Normal duodenal bulb and second portion of the duodenum. Biopsied.  Colonoscopy 09/10/21 for IDA - Five 6 to 10 mm polyps in the rectum, in the transverse colon and in the cecum, removed with a cold snare. Resected  and retrieved. - Mild diverticulosis in the left colon. - The examination was otherwise normal on direct and retroflexion views.  Diagnosis 1. Surgical [P], colon, rectum, transverse, and cecum, polyp (5) SESSILE SERRATED POLYP WITHOUT CYTOLOGIC DYSPLASIA, TUBULAR ADENOMAS AND HYPERPLASTIC POLYP. NEGATIVE FOR HIGH-GRADE DYSPLASIA. 2. Surgical [P], duodenal bxs FRAGMENTS OF NORMAL DUODENAL MUCOSA. THERE ARE NO DIAGNOSTIC FEATURES OF CELIAC DISEASE. 3. Surgical [P], gastric bxs FRAGMENTS OF GASTRIC MUCOSA WITH NO SIGNIFICANT MICROSCOPIC ABNORMALITIES. H. PYLORI, INTESTINAL METAPLASIA, ATROPHY AND DYSPLASIA ARE NOT IDENTIFIED.  Colonoscopy 10/2016 - Mild diverticulosis in the left colon. There was no evidence of diverticular bleeding. - The examination was otherwise normal on direct and retroflexion views. - No specimens collected.  Past Medical History:  Diagnosis Date   (HFpEF) heart failure with preserved ejection fraction (HCC)    a. 08/2019 Echo: EF 55-60%, no rwma, Gr1 DD, nl RV size/fxn; b. 09/2022 Echo: Ef 60-65%, no rwma, GrI Dd, nl RV fxn, mildly dil LA. no significant valvular disease.   Allergy    some   Cataract    bilaterally both eyes    Chest pain    a. 07/2019 MV: EF 77%, no ischemia/infarct.   CKD (chronic kidney disease), stage III (HCC)    CLL (chronic lymphocytic leukemia) (HCC)    stage 0   Gallstones    GERD (gastroesophageal reflux disease)    H/O blood clots    Hydrocephalus (HCC)    [facility]    Hyperglycemia    Hyperlipidemia    Hypertension    a. 03/2022 Renal  Duplex: No RAS.   Lymphocytosis    monoclonal b cell   Migraine, unspecified, without mention of intractable migraine without mention of status migrainosus 12/14/2012   Osteoarthritis, chronic    Osteoporosis    Peptic ulcer    some bleeding with ulcers as well     Past Surgical History:  Procedure Laterality Date   ABDOMINAL HYSTERECTOMY     BREAST CYST ASPIRATION Left 1993    Removed   breast cyst removed Right    benign    BREAST LUMPECTOMY Bilateral    COLONOSCOPY     ERCP N/A 02/04/2018   Procedure: ENDOSCOPIC RETROGRADE CHOLANGIOPANCREATOGRAPHY (ERCP);  Surgeon: Midge Minium, MD;  Location: [facility] ENDOSCOPY;  Service: Endoscopy;  Laterality: N/A;   ERCP N/A 02/28/2018   Procedure: ENDOSCOPIC RETROGRADE CHOLANGIOPANCREATOGRAPHY (ERCP) STENT REMOVAL;  Surgeon: Midge Minium, MD;  Location: [facility] ENDOSCOPY;  Service: Endoscopy;  Laterality: N/A;   EYE SURGERY     growth removal Left 2019   L wrist growth removed, abnormal cells   History of Cataract surgery Right 2011   left 2011   PARTIAL HYSTERECTOMY     POLYPECTOMY     ROBOTIC ASSISTED LAPAROSCOPIC CHOLECYSTECTOMY-MULTI SITE N/A 03/08/2018   Procedure: ROBOTIC ASSISTED LAPAROSCOPIC CHOLECYSTECTOMY-MULTI SITE;  Surgeon: Leafy Ro, MD;  Location: [facility] ORS;  Service: General;  Laterality: N/A;   S/P ACL repair Left    knee   TONSILLECTOMY     TUBAL LIGATION  1975   Bilateral    Current Outpatient Medications  Medication Sig Dispense Refill   ALPRAZolam (XANAX) 0.25 MG tablet Take 1 tablet (0.25 mg total) by mouth at bedtime. 90 tablet 1   amoxicillin-clavulanate (AUGMENTIN) 875-125 MG tablet Take 1 tablet by mouth in the morning, at noon, and at bedtime. ~0600/1400/2200 21 tablet 0   carvedilol (COREG) 12.5 MG tablet Take 1.5 tablets (18.75 mg total) by mouth 2 (two) times daily with a meal. 90 tablet 4   Cholecalciferol (VITAMIN D3) 1.25 MG (50000 UT) CAPS TAKE ONE CAPSULE BY MOUTH ONCE A WEEK 12 capsule 3   colestipol (COLESTID) 1 g tablet Take 1 tablet (1 g total) by mouth 2 (two) times daily. 180 tablet 5   cyclobenzaprine (FLEXERIL) 10 MG tablet Take 1 tablet (10 mg total) by mouth at bedtime as needed for muscle spasms. 90 tablet 3   donepezil (ARICEPT) 10 MG tablet TAKE ONE TABLET BY MOUTH AT BEDTIME 90 tablet 4   ezetimibe (ZETIA) 10 MG tablet Take 1 tablet (10 mg total) by mouth at bedtime. 90  tablet 1   gabapentin (NEURONTIN) 600 MG tablet Take 1 tablet (600 mg total) by mouth 2 (two) times daily. 180 tablet 3   HYDROcodone-acetaminophen (NORCO/VICODIN) 5-325 MG tablet Take 1-2 tablets by mouth daily as needed for severe pain. 60 tablet 0   levothyroxine (SYNTHROID) 112 MCG tablet TAKE ONE TABLET BY MOUTH ONE TIME DAILY BEFORE BREAKFAST 90 tablet 3   losartan (COZAAR) 100 MG tablet Take 1 tablet (100 mg total) by mouth daily. 90 tablet 3   Magnesium 250 MG TABS Take 1 tablet by mouth daily.     pantoprazole (PROTONIX) 40 MG tablet Take 1 tablet (40 mg total) by mouth every morning. 90 tablet 0   potassium chloride SA (KLOR-CON M) 20 MEQ tablet Take 40 MEQ with use of Lasix 20 mg to prevent low potassium 180 tablet 0   rosuvastatin (CRESTOR) 10 MG tablet Take 1 tablet (10 mg total) by mouth daily. 90  tablet 1   zolpidem (AMBIEN) 10 MG tablet Take 1 tablet (10 mg total) by mouth at bedtime. 30 tablet 5   No current facility-administered medications for this visit.    Allergies as of 12/25/2022 - Review Complete 12/17/2022  Allergen Reaction Noted   Shellfish-derived products Anaphylaxis 12/17/2014   Hydrochlorothiazide Diarrhea 09/24/2022   Amlodipine  10/30/2022   Lasix [furosemide]  10/30/2022   Shellfish allergy  12/17/2014   Latex Rash 07/20/2016    Family History  Problem Relation Age of Onset   Colon cancer Mother    Hypertension Mother    Colon cancer Father    Heart disease Father    Hypertension Father    Colon polyps Neg Hx    Rectal cancer Neg Hx    Stomach cancer Neg Hx    Esophageal cancer Neg Hx     Social History   Socioeconomic History   Marital status: Divorced    Spouse name: Not on file   Number of children: 1   Years of education: college   Highest education level: Some college, no degree  Occupational History   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 Divorced.      Social Determinants of Health   Financial Resource Strain: Low Risk  (12/16/2022)   Overall Financial Resource Strain (CARDIA)    Difficulty of Paying Living Expenses: Not hard at all  Food Insecurity: No Food Insecurity (12/16/2022)   Hunger Vital Sign    Worried About Running Out of Food in the Last Year: Never true    Ran Out of Food in the Last Year: Never true  Transportation Needs: No Transportation Needs (12/16/2022)   PRAPARE - Administrator, Civil Service (Medical): No    Lack of Transportation (Non-Medical): No  Physical Activity: Inactive (12/16/2022)   Exercise Vital Sign    Days of Exercise per Week: 0 days    Minutes of Exercise per Session: 0 min  Stress: Stress Concern Present (12/16/2022)   [employer] of Occupational Health - Occupational Stress Questionnaire    Feeling of Stress : To some extent  Social Connections: Moderately Isolated (12/16/2022)   Social Connection and Isolation Panel [NHANES]    Frequency of Communication with Friends and Family: More than three times a week    Frequency of Social Gatherings with Friends and Family: Twice a week    Attends Religious Services: 1 to 4 times per year    Active Member of [employer] or Organizations: No    Attends Banker Meetings: Never    Marital Status: Divorced  Catering manager Violence: Not At Risk (08/05/2022)   Humiliation, Afraid, Rape, and Kick questionnaire    Fear of Current or Ex-Partner: No    Emotionally Abused: No    Physically Abused: No    Sexually  Abused: No    Review  of Systems:    Constitutional: No weight loss, fever, chills, weakness or fatigue HEENT: Eyes: No change in vision               Ears, Nose, Throat:  No change in hearing or congestion Skin: No rash or itching Cardiovascular: No chest pain, chest pressure or palpitations   Respiratory: No SOB or cough Gastrointestinal: See HPI and otherwise negative Genitourinary: No dysuria or change in urinary frequency Neurological: No headache, dizziness or syncope Musculoskeletal: No new muscle or joint pain Hematologic: No bleeding or bruising Psychiatric: No history of depression or anxiety    Physical Exam:  Vital signs: There were no vitals taken for this visit.  Constitutional: NAD, Well developed, Well nourished, alert and cooperative Head:  Normocephalic and atraumatic. Eyes:   PEERL, EOMI. No icterus. Conjunctiva pink. Respiratory: Respirations even and unlabored. Lungs clear to auscultation bilaterally.   No wheezes, crackles, or rhonchi.  Cardiovascular:  Regular rate and rhythm. No peripheral edema, cyanosis or pallor.  Gastrointestinal:  Soft, nondistended, nontender. No rebound or guarding. Mildly hypoactive bowel sounds. No appreciable masses or hepatomegaly. Rectal:  Not performed.  Msk:  Symmetrical without gross deformities. Without edema, no deformity or joint abnormality.  Neurologic:  Alert and  oriented x4;  grossly normal neurologically.  Skin:   Dry and intact without significant lesions or rashes. Psychiatric: Oriented to person, place and time. Demonstrates good judgement and reason without abnormal affect or behaviors.   RELEVANT LABS AND IMAGING: CBC    Component Value Date/Time   WBC 15.2 (H) 12/17/2022 1335   WBC 15.0 (H) 08/04/2022 1306   RBC 4.86 12/17/2022 1335   RBC 4.56 08/04/2022 1306   HGB 14.4 12/17/2022 1335   HCT 43.3 12/17/2022 1335   PLT 203 12/17/2022 1335   MCV 89 12/17/2022 1335   MCH 29.6 12/17/2022 1335   MCH 30.9  08/04/2022 1306   MCHC 33.3 12/17/2022 1335   MCHC 33.3 08/04/2022 1306   RDW 12.9 12/17/2022 1335   LYMPHSABS 9.3 (H) 08/04/2022 1306   LYMPHSABS 10.0 (H) 07/30/2022 1128   MONOABS 1.0 08/04/2022 1306   EOSABS 0.5 08/04/2022 1306   EOSABS 0.5 (H) 07/30/2022 1128   BASOSABS 0.1 08/04/2022 1306   BASOSABS 0.1 07/30/2022 1128    CMP     Component Value Date/Time   NA 143 12/17/2022 1335   K 4.7 12/17/2022 1335   CL 105 12/17/2022 1335   CO2 23 12/17/2022 1335   GLUCOSE 121 (H) 12/17/2022 1335   GLUCOSE 134 (H) 05/26/2022 1401   BUN 18 12/17/2022 1335   CREATININE 1.16 (H) 12/17/2022 1335   CALCIUM 9.5 12/17/2022 1335   PROT 6.6 12/17/2022 1335   ALBUMIN 4.0 12/17/2022 1335   AST 24 12/17/2022 1335   ALT 24 12/17/2022 1335   ALKPHOS 112 12/17/2022 1335   BILITOT 0.3 12/17/2022 1335   GFRNONAA 49 (L) 05/26/2022 1401   GFRAA 46 (L) 12/15/2019 0956     Assessment/Plan:   Diarrhea Change in bowel habits Acute onset diarrhea, 3-5 times per day mushy/yellow ongoing since increasing dose of HCTZ and worsened with recent course of Augmentin given for presumed diverticulitis (absence of abdominal pain).  Recent colonoscopy last year is reassuring, however, no biopsies were done for microscopic colitis since procedure was done for IDA at the time and patient was asymptomatic. --- GI pathogen panel and C. difficile to rule out infectious etiology --- Pancreatic fecal elastase to rule out pancreatic insufficiency with presence  of yellowish stools --- Recommend getting on a fiber supplement daily (Benefiber/Metamucil) --- If the above are negative, would recommend KUB to rule out stool burden/possible overflow diarrhea  History of IDA Iron studies April 2024 with ferritin 76, iron 92, TIBC 360, saturation 26% -- Recent EGD/colonoscopy in 2023 -- Iron studies stable  [facility] 12/25/2022, 12:39 PM  Cc: Jacky Kindle, FNP

## 2022-12-25 NOTE — Patient Instructions (Addendum)
Please purchase the following medications over the counter and take as directed: Metamucil Benfiber                     TAKE BOTH DAILY  _______________________________________________________  Cathy Barnes have a follow up with Dr Russella Dar on 03/01/2023 2:10pm   Your provider has requested that you go to the basement level for lab work before leaving today. Press "B" on the elevator. The lab is located at the first door on the left as you exit the elevator.   If your blood pressure at your visit was 140/90 or greater, please contact your primary care physician to follow up on this.  _______________________________________________________  If you are age 54 or older, your body mass index should be between 23-30. Your Body mass index is 33.84 kg/m. If this is out of the aforementioned range listed, please consider follow up with your Primary Care Provider.  If you are age 30 or younger, your body mass index should be between 19-25. Your Body mass index is 33.84 kg/m. If this is out of the aformentioned range listed, please consider follow up with your Primary Care Provider.   ________________________________________________________  The [facility] providers would like to encourage you to use [facility] to communicate with providers for non-urgent requests or questions.  Due to long hold times on the telephone, sending your provider a message by [facility] may be a faster and more efficient way to get a response.  Please allow 48 business hours for a response.  Please remember that this is for non-urgent requests.  _______________________________________________________ It was a pleasure to see you today!  Thank you for trusting me with your gastrointestinal care!

## 2022-12-30 ENCOUNTER — Other Ambulatory Visit: Payer: Medicare PPO

## 2022-12-30 DIAGNOSIS — R194 Change in bowel habit: Secondary | ICD-10-CM

## 2022-12-30 DIAGNOSIS — R197 Diarrhea, unspecified: Secondary | ICD-10-CM

## 2022-12-31 ENCOUNTER — Other Ambulatory Visit: Payer: Self-pay | Admitting: Family Medicine

## 2022-12-31 DIAGNOSIS — E7800 Pure hypercholesterolemia, unspecified: Secondary | ICD-10-CM

## 2022-12-31 DIAGNOSIS — F5104 Psychophysiologic insomnia: Secondary | ICD-10-CM

## 2023-01-01 LAB — GASTROINTESTINAL PATHOGEN PNL
CampyloBacter Group: NOT DETECTED
Norovirus GI/GII: NOT DETECTED
Rotavirus A: NOT DETECTED
Salmonella species: NOT DETECTED
Shiga Toxin 1: NOT DETECTED
Shiga Toxin 2: NOT DETECTED
Shigella Species: NOT DETECTED
Vibrio Group: NOT DETECTED
Yersinia enterocolitica: NOT DETECTED

## 2023-01-01 LAB — GI PROFILE, STOOL, PCR

## 2023-01-05 ENCOUNTER — Telehealth: Payer: Self-pay | Admitting: Gastroenterology

## 2023-01-05 NOTE — Telephone Encounter (Signed)
PT is calling to get a script for her diarrhea until she gets her lab results. Immodium is not working and she has to go out of town. Script should be sent to [facility] in [place]

## 2023-01-06 ENCOUNTER — Other Ambulatory Visit: Payer: Self-pay | Admitting: *Deleted

## 2023-01-06 DIAGNOSIS — R197 Diarrhea, unspecified: Secondary | ICD-10-CM

## 2023-01-06 NOTE — Telephone Encounter (Signed)
Patient is advised she may increase imodium to up to 8 mg daily for now as previously directed. I contacted [facility] regarding fecal elastase lab. Current turn around time is 8 business days. I advised patient that this test should return within the next couple of days. In addition, advised patient we will place an order for abdominal xray just for confirmation that she does not have a large stool burden causing overflow diarrhea. Patient verbalizes understanding.

## 2023-01-06 NOTE — Telephone Encounter (Signed)
Patient calls with c/o continued diarrhea multiple times daily. Patient states that she has taken imodium 2 tablets daily but this does not seem to make any difference in symptoms for her. Patient also says that she has started metamucil daily but again has noticed no change in symptoms. She explains that she has had multiple episodes of near incontinence and has urgency to get to restroom. Denies abdominal pain associated with diarrhea; no bleeding. I have advised that we are awaiting one stool study to return (fecal elastase) that will help [place] determine cause of diarrhea, but have explained that it appears infectious causes have been ruled out. I advised she may take imodium up to 8 mg (=4 tablets) daily for now and I would reach back out once I have additional recommendations from [facility]. She verbalizes understanding.

## 2023-01-07 ENCOUNTER — Ambulatory Visit
Admission: RE | Admit: 2023-01-07 | Discharge: 2023-01-07 | Disposition: A | Discharge status: Home / Self Care | Payer: Medicare PPO | Source: Ambulatory Visit | Attending: Gastroenterology | Admitting: Gastroenterology

## 2023-01-07 DIAGNOSIS — R197 Diarrhea, unspecified: Secondary | ICD-10-CM | POA: Diagnosis not present

## 2023-01-07 DIAGNOSIS — K529 Noninfective gastroenteritis and colitis, unspecified: Secondary | ICD-10-CM | POA: Diagnosis not present

## 2023-01-07 LAB — PANCREATIC ELASTASE, FECAL: Pancreatic Elastase-1, Stool: 194 mcg/g — ABNORMAL LOW

## 2023-01-08 ENCOUNTER — Other Ambulatory Visit: Payer: Self-pay | Admitting: *Deleted

## 2023-01-08 MED ORDER — PANCRELIPASE (LIP-PROT-AMYL) 36000-114000 UNITS PO CPEP: ORAL_CAPSULE | ORAL | 0 refills | Status: DC

## 2023-01-28 ENCOUNTER — Telehealth: Payer: Self-pay | Admitting: Gastroenterology

## 2023-01-28 NOTE — Telephone Encounter (Signed)
Inbound call from patient stating she is still having diarrhea. Patient is also requesting to speak about magnesium she is taking. Patient requesting a call back to discuss. Please advise, thank you.

## 2023-01-29 NOTE — Telephone Encounter (Signed)
Patient states that she has been taking creon 36,000 units, 2 capsules with meals, 1 capsule with snacks over the last couple of weeks. She states that this has not helped her diarrhea at all. I asked patient if she was still taking PRN imodium. Patient states that she has not taken any since beginning pancreatic enzymes. I advised that she may add imodium back into her regimen. In addition, patient is taking magnesium 500 mg OTC supplement. I advised that this could also contribute somewhat to her diarrhea. Patient will try holding this supplement for a few days as well to see if there is any improvement in her symptoms. Patient to call us back if these measures are ineffective.

## 2023-02-01 ENCOUNTER — Other Ambulatory Visit: Payer: Self-pay | Admitting: Family Medicine

## 2023-02-01 DIAGNOSIS — E034 Atrophy of thyroid (acquired): Secondary | ICD-10-CM

## 2023-02-02 ENCOUNTER — Encounter: Payer: Self-pay | Admitting: Nurse Practitioner

## 2023-02-02 ENCOUNTER — Ambulatory Visit: Payer: Medicare PPO | Attending: Nurse Practitioner | Admitting: Nurse Practitioner

## 2023-02-02 VITALS — BP 131/63 | HR 66 | Ht 64.0 in | Wt 200.2 lb

## 2023-02-02 DIAGNOSIS — I10 Essential (primary) hypertension: Secondary | ICD-10-CM | POA: Diagnosis not present

## 2023-02-02 DIAGNOSIS — I5189 Other ill-defined heart diseases: Secondary | ICD-10-CM | POA: Diagnosis not present

## 2023-02-02 DIAGNOSIS — E782 Mixed hyperlipidemia: Secondary | ICD-10-CM | POA: Diagnosis not present

## 2023-02-02 DIAGNOSIS — N183 Chronic kidney disease, stage 3 unspecified: Secondary | ICD-10-CM | POA: Diagnosis not present

## 2023-02-02 NOTE — Progress Notes (Signed)
Office Visit    Patient Name: Cathy Barnes Date of Encounter: 02/02/2023  Primary Care Provider:  Jacky Kindle, FNP Primary Cardiologist:  Lorine Bears, MD  Chief Complaint    82 y.o. female with a history of hypertension, hyperlipidemia, borderline diabetes, hypothyroidism, CKD 3, and chronic lymphocytic leukemia, who presents for follow-up related to hypertension.   Past Medical History    Past Medical History:  Diagnosis Date   (HFpEF) heart failure with preserved ejection fraction (HCC)    a. 08/2019 Echo: EF 55-60%, no rwma, Gr1 DD, nl RV size/fxn; b. 09/2022 Echo: EF 60-65%, no rwma, GrI Dd, nl RV fxn, mildly dil LA. no significant valvular disease.   Allergy    some   Cataract    bilaterally both eyes    Chest pain    a. 07/2019 MV: EF 77%, no ischemia/infarct.   CKD (chronic kidney disease), stage III (HCC)    CLL (chronic lymphocytic leukemia) (HCC)    stage 0   Gallstones    GERD (gastroesophageal reflux disease)    H/O blood clots    Hydrocephalus (HCC)    [facility]    Hyperglycemia    Hyperlipidemia    Hypertension    a. 03/2022 Renal Duplex: No RAS.   Lymphocytosis    monoclonal b cell   Migraine, unspecified, without mention of intractable migraine without mention of status migrainosus 12/14/2012   Osteoarthritis, chronic    Osteoporosis    Peptic ulcer    some bleeding with ulcers as well    Past Surgical History:  Procedure Laterality Date   ABDOMINAL HYSTERECTOMY     BREAST CYST ASPIRATION Left 1993   Removed   breast cyst removed Right    benign    BREAST LUMPECTOMY Bilateral    COLONOSCOPY     ERCP N/A 02/04/2018   Procedure: ENDOSCOPIC RETROGRADE CHOLANGIOPANCREATOGRAPHY (ERCP);  Surgeon: Midge Minium, MD;  Location: [facility] ENDOSCOPY;  Service: Endoscopy;  Laterality: N/A;   ERCP N/A 02/28/2018   Procedure: ENDOSCOPIC RETROGRADE CHOLANGIOPANCREATOGRAPHY (ERCP) STENT REMOVAL;  Surgeon: Midge Minium, MD;  Location: [facility] ENDOSCOPY;   Service: Endoscopy;  Laterality: N/A;   EYE SURGERY     growth removal Left 2019   L wrist growth removed, abnormal cells   History of Cataract surgery Right 2011   left 2011   PARTIAL HYSTERECTOMY     POLYPECTOMY     ROBOTIC ASSISTED LAPAROSCOPIC CHOLECYSTECTOMY-MULTI SITE N/A 03/08/2018   Procedure: ROBOTIC ASSISTED LAPAROSCOPIC CHOLECYSTECTOMY-MULTI SITE;  Surgeon: Leafy Ro, MD;  Location: [facility] ORS;  Service: General;  Laterality: N/A;   S/P ACL repair Left    knee   TONSILLECTOMY     TUBAL LIGATION  1975   Bilateral    Allergies  Allergies  Allergen Reactions   Shellfish-Derived Products Anaphylaxis    Throat closes, rash   Hydrochlorothiazide Diarrhea    Microzide branding    Amlodipine     Lower ext edema   Lasix [Furosemide]     Profound itching   Shellfish Allergy     Throat closes, rash   Latex Rash    Tapes,adhesives    History of Present Illness      82 y.o. y/o female with history of hypertension, hyperlipidemia, borderline diabetes, hypothyroidism, CKD III, and chronic lymphocytic leukemia.  She was previously evaluated by cardiology almost 20 years ago in the setting of chest pain and reportedly underwent stress testing and echocardiography which showed no significant abnormalities.  In April 2021, she was seen by Dr. Kirke Corin in the setting of dyspnea occurring with minimal activity as well as occasional right-sided chest squeezing and discomfort with exertion.  EKG was unremarkable.  Stress testing was performed and showed no ischemia or infarct.  Echo showed normal LV function with grade 1 diastolic dysfunction and no significant valvular abnormalities.  In October 2022, she was diagnosed with left popliteal DVT and was treated w/ 12 months of eliquis - she is no longer taking.  In the setting of poorly controlled hypertension, she underwent renal artery duplex in December 2023, which was negative for renal artery stenosis.   In the setting of lower  extremity edema in the summer of 2024, she required dose reduction and subsequent discontinuation of amlodipine therapy, with improvement in lower extremity swelling.  She did not tolerate Lasix secondary to itching.  An echo was performed and showed normal LV function with grade 1 diastolic dysfunction and no significant valvular disease.    [name] was last seen in cardiology clinic in July 2024.  Carvedilol was titrated to 18.75 mg twice daily at that time due to ongoing hypertension in the absence of amlodipine therapy.  Since her last visit, Cathy Barnes has done well from a cardiac standpoint.  Her blood pressure has been typically running in the 130/70 range when seen at outpatient visits.  She has not had any recurrence of lower extremity edema and is quite pleased with the lack of swelling and improvement in blood pressure off of amlodipine.  She has been experiencing diarrhea since July and was recently placed on Creon after pancreatic elastase-1 returned mildly abnormal at 194.  She thinks diarrhea may have improved a little bit but has not resolved completely.  She denies chest pain, dyspnea, palpitations, PND, orthopnea, dizziness, syncope, edema, or early satiety.  Activity is limited by chronic low back pain.  She is looking into potentially joining the [facility] for water based activities. Objective   Home Medications    Current Outpatient Medications  Medication Sig Dispense Refill   ALPRAZolam (XANAX) 0.25 MG tablet TAKE ONE TABLET BY MOUTH AT BEDTIME 90 tablet 1   carvedilol (COREG) 12.5 MG tablet Take 1.5 tablets (18.75 mg total) by mouth 2 (two) times daily with a meal. 90 tablet 4   Cholecalciferol (VITAMIN D3) 1.25 MG (50000 UT) CAPS TAKE ONE CAPSULE BY MOUTH ONCE A WEEK 12 capsule 3   cyclobenzaprine (FLEXERIL) 10 MG tablet Take 1 tablet (10 mg total) by mouth at bedtime as needed for muscle spasms. 90 tablet 3   donepezil (ARICEPT) 10 MG tablet TAKE ONE TABLET BY MOUTH AT BEDTIME  90 tablet 4   ezetimibe (ZETIA) 10 MG tablet TAKE ONE TABLET BY MOUTH AT BEDTIME 90 tablet 1   gabapentin (NEURONTIN) 600 MG tablet Take 1 tablet (600 mg total) by mouth 2 (two) times daily. 180 tablet 3   HYDROcodone-acetaminophen (NORCO/VICODIN) 5-325 MG tablet Take 1-2 tablets by mouth daily as needed for severe pain. 60 tablet 0   levothyroxine (SYNTHROID) 112 MCG tablet TAKE ONE TABLET BY MOUTH ONE TIME DAILY BEFORE BREAKFAST 90 tablet 3   lipase/protease/amylase (CREON) 36000 UNITS CPEP capsule Take 2 capsules (72,000 Units total) by mouth 3 (three) times daily before meals AND 1 capsule (36,000 Units total) with snacks. 300 capsule 0   losartan (COZAAR) 100 MG tablet Take 1 tablet (100 mg total) by mouth daily. 90 tablet 3   pantoprazole (PROTONIX) 40 MG tablet Take 1  tablet (40 mg total) by mouth every morning. 90 tablet 0   potassium chloride SA (KLOR-CON M) 20 MEQ tablet Take 40 MEQ with use of Lasix 20 mg to prevent low potassium 180 tablet 0   rosuvastatin (CRESTOR) 10 MG tablet Take 1 tablet (10 mg total) by mouth daily. 90 tablet 1   zolpidem (AMBIEN) 10 MG tablet TAKE ONE TABLET BY MOUTH AT BEDTIME 30 tablet 5   No current facility-administered medications for this visit.     Review of Systems    Diarrhea as outlined above.  She denies chest pain, palpitations, dyspnea, pnd, orthopnea, n, v, dizziness, syncope, edema, weight gain, or early satiety.  All other systems reviewed and are otherwise negative except as noted above.    Physical Exam    VS:  BP 131/63   Pulse 66   Ht 5\' 4"  (1.626 m)   Wt 200 lb 3.2 oz (90.8 kg)   SpO2 95%   BMI 34.36 kg/m  , BMI Body mass index is 34.36 kg/m.     GEN: Well nourished, well developed, in no acute distress. HEENT: normal. Neck: Supple, no JVD, carotid bruits, or masses. Cardiac: RRR, no murmurs, rubs, or gallops. No clubbing, cyanosis, edema.  Radials 2+/PT 2+ and equal bilaterally.  Respiratory:  Respirations regular and  unlabored, clear to auscultation bilaterally. GI: Soft, nontender, nondistended, BS + x 4. MS: no deformity or atrophy. Skin: warm and dry, no rash. Neuro:  Strength and sensation are intact. Psych: Normal affect.  Accessory Clinical Findings    Lab Results  Component Value Date   WBC 15.2 (H) 12/17/2022   HGB 14.4 12/17/2022   HCT 43.3 12/17/2022   MCV 89 12/17/2022   PLT 203 12/17/2022   Lab Results  Component Value Date   CREATININE 1.16 (H) 12/17/2022   BUN 18 12/17/2022   NA 143 12/17/2022   K 4.7 12/17/2022   CL 105 12/17/2022   CO2 23 12/17/2022   Lab Results  Component Value Date   ALT 24 12/17/2022   AST 24 12/17/2022   GGT 24 12/17/2022   ALKPHOS 112 12/17/2022   BILITOT 0.3 12/17/2022   Lab Results  Component Value Date   CHOL 174 12/17/2022   HDL 41 12/17/2022   LDLCALC 94 12/17/2022   TRIG 232 (H) 12/17/2022   CHOLHDL 4.2 12/17/2022    Lab Results  Component Value Date   HGBA1C 6.5 (H) 12/17/2022      Assessment & Plan    1.  Primary hypertension: Blood pressure has been stable in the 130/70 range ever since discontinue amlodipine and titrating carvedilol over the summer.  She also remains on losartan 100 mg daily.  2.  Dependent edema/diastolic dysfunction: This resolved with discontinuation of amlodipine and she no longer requires diuretics, which she did not tolerate anyway.  3.  Hyperlipidemia: Remains on Zetia with an LDL of 94 in August of this year.  LFTs were normal at that time.  4.  Stage III chronic kidney disease: Creatinine stable at 1.16 in August 2024.  She remains on ARB therapy.  5.  Diarrhea: Ongoing since mid July.  She has been evaluated by GI and recently placed on Creon with slight improvement though not resolution.  She will continue to follow-up with GI.  6.  Disposition: Follow-up in 6 months or sooner if necessary.  Nicolasa Ducking, NP 02/02/2023, 1:48 PM

## 2023-02-02 NOTE — Patient Instructions (Signed)
Medication Instructions:  Your physician recommends that you continue on your current medications as directed. Please refer to the Current Medication list given to you today.  *If you need a refill on your cardiac medications before your next appointment, please call your pharmacy*   Lab Work: NONE If you have labs (blood work) drawn today and your tests are completely normal, you will receive your results only by: MyChart Message (if you have MyChart) OR A paper copy in the mail If you have any lab test that is abnormal or we need to change your treatment, we will call you to review the results.   Testing/Procedures: NONE   Follow-Up: At [facility], you and your health needs are our priority.  As part of our continuing mission to provide you with exceptional heart care, we have created designated Provider Care Teams.  These Care Teams include your primary Cardiologist (physician) and Advanced Practice Providers (APPs -  Physician Assistants and Nurse Practitioners) who all work together to provide you with the care you need, when you need it.  We recommend signing up for the patient portal called "MyChart".  Sign up information is provided on this After Visit Summary.  MyChart is used to connect with patients for Virtual Visits (Telemedicine).  Patients are able to view lab/test results, encounter notes, upcoming appointments, etc.  Non-urgent messages can be sent to your provider as well.   To learn more about what you can do with MyChart, go to [email or web address].    Your next appointment:   6 month(s)  Provider:   You may see Lorine Bears, MD or one of the following Advanced Practice Providers on your designated Care Team:   Nicolasa Ducking, NP

## 2023-02-02 NOTE — Telephone Encounter (Signed)
Requested Prescriptions  Pending Prescriptions Disp Refills   levothyroxine (SYNTHROID) 112 MCG tablet [Pharmacy Med Name: LEVOTHYROXINE 112 MCG TAB[*]] 90 tablet 3    Sig: TAKE ONE TABLET BY MOUTH ONE TIME DAILY BEFORE BREAKFAST     Endocrinology:  Hypothyroid Agents Passed - 02/01/2023  9:19 AM      Passed - TSH in normal range and within 360 days    TSH  Date Value Ref Range Status  12/17/2022 2.190 0.450 - 4.500 uIU/mL Final         Passed - Valid encounter within last 12 months    Recent Outpatient Visits           1 month ago Annual physical exam   [facility] Merita Norton T, FNP   4 months ago Diarrhea, unspecified type   [facility] Jacky Kindle, FNP   4 months ago Leg cramping   [facility] Merita Norton T, FNP   5 months ago Bilateral leg edema   [facility] Merita Norton T, FNP   6 months ago Numbness and tingling of left leg   [facility] Jacky Kindle, [place]       Future Appointments             Today Brion Aliment, Dois Davenport, NP [facility]

## 2023-02-08 ENCOUNTER — Other Ambulatory Visit: Payer: Self-pay

## 2023-02-08 DIAGNOSIS — C9110 Chronic lymphocytic leukemia of B-cell type not having achieved remission: Secondary | ICD-10-CM

## 2023-02-09 ENCOUNTER — Inpatient Hospital Stay: Payer: Medicare PPO | Attending: Oncology

## 2023-02-09 ENCOUNTER — Encounter: Payer: Self-pay | Admitting: Oncology

## 2023-02-09 ENCOUNTER — Inpatient Hospital Stay: Payer: Medicare PPO | Admitting: Oncology

## 2023-02-09 VITALS — BP 148/61 | HR 67 | Temp 96.7°F | Wt 208.6 lb

## 2023-02-09 DIAGNOSIS — R197 Diarrhea, unspecified: Secondary | ICD-10-CM | POA: Insufficient documentation

## 2023-02-09 DIAGNOSIS — D508 Other iron deficiency anemias: Secondary | ICD-10-CM

## 2023-02-09 DIAGNOSIS — D7219 Other eosinophilia: Secondary | ICD-10-CM | POA: Diagnosis not present

## 2023-02-09 DIAGNOSIS — Z86718 Personal history of other venous thrombosis and embolism: Secondary | ICD-10-CM | POA: Diagnosis not present

## 2023-02-09 DIAGNOSIS — Z800 Family history of malignant neoplasm of digestive organs: Secondary | ICD-10-CM | POA: Insufficient documentation

## 2023-02-09 DIAGNOSIS — C9110 Chronic lymphocytic leukemia of B-cell type not having achieved remission: Secondary | ICD-10-CM | POA: Diagnosis not present

## 2023-02-09 DIAGNOSIS — Z862 Personal history of diseases of the blood and blood-forming organs and certain disorders involving the immune mechanism: Secondary | ICD-10-CM | POA: Insufficient documentation

## 2023-02-09 DIAGNOSIS — Z79899 Other long term (current) drug therapy: Secondary | ICD-10-CM | POA: Diagnosis not present

## 2023-02-09 LAB — CBC WITH DIFFERENTIAL (CANCER CENTER ONLY)
Abs Immature Granulocytes: 0.06 10*3/uL (ref 0.00–0.07)
Basophils Absolute: 0.1 10*3/uL (ref 0.0–0.1)
Basophils Relative: 1 %
Eosinophils Absolute: 0.7 10*3/uL — ABNORMAL HIGH (ref 0.0–0.5)
Eosinophils Relative: 4 %
HCT: 41 % (ref 36.0–46.0)
Hemoglobin: 13.7 g/dL (ref 12.0–15.0)
Immature Granulocytes: 0 %
Lymphocytes Relative: 61 %
Lymphs Abs: 9.9 10*3/uL — ABNORMAL HIGH (ref 0.7–4.0)
MCH: 29.8 pg (ref 26.0–34.0)
MCHC: 33.4 g/dL (ref 30.0–36.0)
MCV: 89.1 fL (ref 80.0–100.0)
Monocytes Absolute: 1.7 10*3/uL — ABNORMAL HIGH (ref 0.1–1.0)
Monocytes Relative: 11 %
Neutro Abs: 3.8 10*3/uL (ref 1.7–7.7)
Neutrophils Relative %: 23 %
Platelet Count: 203 10*3/uL (ref 150–400)
RBC: 4.6 MIL/uL (ref 3.87–5.11)
RDW: 13.4 % (ref 11.5–15.5)
WBC Count: 16.3 10*3/uL — ABNORMAL HIGH (ref 4.0–10.5)
nRBC: 0 % (ref 0.0–0.2)

## 2023-02-09 LAB — IRON AND TIBC
Iron: 109 ug/dL (ref 28–170)
Saturation Ratios: 31 % (ref 10.4–31.8)
TIBC: 356 ug/dL (ref 250–450)
UIBC: 247 ug/dL

## 2023-02-09 LAB — FERRITIN: Ferritin: 68 ng/mL (ref 11–307)

## 2023-02-09 NOTE — Progress Notes (Signed)
Hematology/Oncology Consult note [facility]  Telephone:([phone] [phone]  Patient Care Team: Jacky Kindle, FNP as PCP - General (Family Medicine) Iran Ouch, MD as PCP - Cardiology (Cardiology) Mateo Flow, MD as Consulting Physician (Ophthalmology) Anson Fret, MD as Consulting Physician (Neurology) Creig Hines, MD as Consulting Physician (Oncology)   Name of the [name]  [id number]  1940-10-20   Date of visit: 02/09/23  Diagnosis-   1.  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  Moderate eosinophilia possibly secondary to CLL etiology unclear  3. LLE DVT in October 2022  Chief complaint/ Reason for visit-routine follow-up of CLL  Heme/Onc history: patient is a 82 year-old female with a past medical history significant for hypertension, vitamin D deficiency and hypothyroidism. She has been sent to [place] for evaluation of leukocytosis. Over the last 1 year patient's white count has been around 12 with predominantly lymphocytosis and monocytosis as well as eosinophilia. No evidence of anemia or thrombocytopenia.  Further blood work from 07-2016 was as follows: CBC showed white count of 12.9 with an absolute lymphocyte count of 6.6 and an eosinophilia of 1.1. H&H was 13.4/41.6 and a platelet count of 270. CMP was unremarkable. Review of peripheral smear showed mild leukocytosis with absolute lymphocytosis and eosinophilia. BCR abl testing was negative for CML. Flow cytometry showed CD5 positive, CD23 positive clonal B-cell population CLL/SLL phenotype CD38 negative. 36% of leukocytes are less than 5000/mcL. Eosinophilia   Patient found to have left lower extremity DVT in October 2022 when she presented with left calf pain and swelling.  Occlusive DVT noted in the left popliteal vein posterior tibial vein and peroneal vein.  She took Eliquis for 6 months and then stopped it   Patient noted to be iron deficient based  on labs in June 2023 and received IV iron    Interval history-she is doing well overall with no significant changes in her appetite or weight.  Denies any lumps or bumps anywhere.  She occasionally gets migrating skin rash which is painful for a few days and then disappears on its own.  She is on Creon for her GI symptoms which has been helping her with her diarrhea.  ECOG PS- 1 Pain scale- 0   Review of systems- Review of Systems  Constitutional:  Negative for chills, fever, malaise/fatigue and weight loss.  HENT:  Negative for congestion, ear discharge and nosebleeds.   Eyes:  Negative for blurred vision.  Respiratory:  Negative for cough, hemoptysis, sputum production, shortness of breath and wheezing.   Cardiovascular:  Negative for chest pain, palpitations, orthopnea and claudication.  Gastrointestinal:  Negative for abdominal pain, blood in stool, constipation, diarrhea, heartburn, melena, nausea and vomiting.  Genitourinary:  Negative for dysuria, flank pain, frequency, hematuria and urgency.  Musculoskeletal:  Negative for back pain, joint pain and myalgias.  Skin:  Negative for rash.  Neurological:  Negative for dizziness, tingling, focal weakness, seizures, weakness and headaches.  Endo/Heme/Allergies:  Does not bruise/bleed easily.  Psychiatric/Behavioral:  Negative for depression and suicidal ideas. The patient does not have insomnia.       Allergies  Allergen Reactions   Shellfish-Derived Products Anaphylaxis    Throat closes, rash   Hydrochlorothiazide Diarrhea    Microzide branding    Amlodipine     Lower ext edema   Lasix [Furosemide]     Profound itching   Shellfish Allergy     Throat closes, rash  Latex Rash    Tapes,adhesives     Past Medical History:  Diagnosis Date   (HFpEF) heart failure with preserved ejection fraction (HCC)    a. 08/2019 Echo: EF 55-60%, no rwma, Gr1 DD, nl RV size/fxn; b. 09/2022 Echo: EF 60-65%, no rwma, GrI Dd, nl RV fxn, mildly  dil LA. no significant valvular disease.   Allergy    some   Cataract    bilaterally both eyes    Chest pain    a. 07/2019 MV: EF 77%, no ischemia/infarct.   CKD (chronic kidney disease), stage III (HCC)    CLL (chronic lymphocytic leukemia) (HCC)    stage 0   Gallstones    GERD (gastroesophageal reflux disease)    H/O blood clots    Hydrocephalus (HCC)    [facility]    Hyperglycemia    Hyperlipidemia    Hypertension    a. 03/2022 Renal Duplex: No RAS.   Lymphocytosis    monoclonal b cell   Migraine, unspecified, without mention of intractable migraine without mention of status migrainosus 12/14/2012   Osteoarthritis, chronic    Osteoporosis    Peptic ulcer    some bleeding with ulcers as well      Past Surgical History:  Procedure Laterality Date   ABDOMINAL HYSTERECTOMY     BREAST CYST ASPIRATION Left 1993   Removed   breast cyst removed Right    benign    BREAST LUMPECTOMY Bilateral    COLONOSCOPY     ERCP N/A 02/04/2018   Procedure: ENDOSCOPIC RETROGRADE CHOLANGIOPANCREATOGRAPHY (ERCP);  Surgeon: Midge Minium, MD;  Location: [facility] ENDOSCOPY;  Service: Endoscopy;  Laterality: N/A;   ERCP N/A 02/28/2018   Procedure: ENDOSCOPIC RETROGRADE CHOLANGIOPANCREATOGRAPHY (ERCP) STENT REMOVAL;  Surgeon: Midge Minium, MD;  Location: [facility] ENDOSCOPY;  Service: Endoscopy;  Laterality: N/A;   EYE SURGERY     growth removal Left 2019   L wrist growth removed, abnormal cells   History of Cataract surgery Right 2011   left 2011   PARTIAL HYSTERECTOMY     POLYPECTOMY     ROBOTIC ASSISTED LAPAROSCOPIC CHOLECYSTECTOMY-MULTI SITE N/A 03/08/2018   Procedure: ROBOTIC ASSISTED LAPAROSCOPIC CHOLECYSTECTOMY-MULTI SITE;  Surgeon: Leafy Ro, MD;  Location: [facility] ORS;  Service: General;  Laterality: N/A;   S/P ACL repair Left    knee   TONSILLECTOMY     TUBAL LIGATION  1975   Bilateral    Social History   Socioeconomic History   Marital status: Divorced    Spouse name: Not on  file   Number of children: 1   Years of education: college   Highest education level: Some college, no degree  Occupational History   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 Divorced.      Social Determinants of Health   Financial Resource Strain: Low Risk  (12/16/2022)   Overall Physicist, medical Strain (  CARDIA)    Difficulty of Paying Living Expenses: Not hard at all  Food Insecurity: No Food Insecurity (12/16/2022)   Hunger Vital Sign    Worried About Running Out of Food in the Last Year: Never true    Ran Out of Food in the Last Year: Never true  Transportation Needs: No Transportation Needs (12/16/2022)   PRAPARE - Administrator, Civil Service (Medical): No    Lack of Transportation (Non-Medical): No  Physical Activity: Inactive (12/16/2022)   Exercise Vital Sign    Days of Exercise per Week: 0 days    Minutes of Exercise per Session: 0 min  Stress: Stress Concern Present (12/16/2022)   [employer] of Occupational Health - Occupational Stress Questionnaire    Feeling of Stress : To some extent  Social Connections: Moderately Isolated (12/16/2022)   Social Connection and Isolation Panel [NHANES]    Frequency of Communication with Friends and Family: More than three times a week    Frequency of Social Gatherings with Friends and Family: Twice a week    Attends Religious Services: 1 to 4 times per year    Active Member of [employer] or Organizations: No    Attends Occupational hygienist Meetings: Never    Marital Status: Divorced  Catering manager Violence: Not At Risk (08/05/2022)   Humiliation, Afraid, Rape, and Kick questionnaire    Fear of Current or Ex-Partner: No    Emotionally Abused: No    Physically Abused: No    Sexually Abused: No    Family History  Problem Relation Age of Onset   Colon cancer Mother    Hypertension Mother    Colon cancer Father    Heart disease Father    Hypertension Father    Colon polyps Neg Hx    Rectal cancer Neg Hx    Stomach cancer Neg Hx    Esophageal cancer Neg Hx      Current Outpatient Medications:    ALPRAZolam (XANAX) 0.25 MG tablet, TAKE ONE TABLET BY MOUTH AT BEDTIME, Disp: 90 tablet, Rfl: 1   carvedilol (COREG) 12.5 MG tablet, Take 1.5 tablets (18.75 mg total) by mouth 2 (two) times daily with a meal., Disp: 90 tablet, Rfl: 4   Cholecalciferol (VITAMIN D3) 1.25 MG (50000 UT) CAPS, TAKE ONE CAPSULE BY MOUTH ONCE A WEEK, Disp: 12 capsule, Rfl: 3   cyclobenzaprine (FLEXERIL) 10 MG tablet, Take 1 tablet (10 mg total) by mouth at bedtime as needed for muscle spasms., Disp: 90 tablet, Rfl: 3   donepezil (ARICEPT) 10 MG tablet, TAKE ONE TABLET BY MOUTH AT BEDTIME, Disp: 90 tablet, Rfl: 4   ezetimibe (ZETIA) 10 MG tablet, TAKE ONE TABLET BY MOUTH AT BEDTIME, Disp: 90 tablet, Rfl: 1   gabapentin (NEURONTIN) 600 MG tablet, Take 1 tablet (600 mg total) by mouth 2 (two) times daily., Disp: 180 tablet, Rfl: 3   HYDROcodone-acetaminophen (NORCO/VICODIN) 5-325 MG tablet, Take 1-2 tablets by mouth daily as needed for severe pain., Disp: 60 tablet, Rfl: 0   levothyroxine (SYNTHROID) 112 MCG tablet, TAKE ONE TABLET BY MOUTH ONE TIME DAILY BEFORE BREAKFAST, Disp: 90 tablet, Rfl: 3   lipase/protease/amylase (CREON) 36000 UNITS CPEP capsule, Take 2 capsules (72,000 Units total) by mouth 3 (three) times daily before meals AND 1 capsule (36,000 Units total) with snacks., Disp: 300 capsule, Rfl: 0   losartan (COZAAR) 100 MG  tablet, Take 1 tablet (100 mg total) by mouth daily., Disp: 90 tablet, Rfl: 3  pantoprazole (PROTONIX) 40 MG tablet, Take 1 tablet (40 mg total) by mouth every morning., Disp: 90 tablet, Rfl: 0   potassium chloride SA (KLOR-CON M) 20 MEQ tablet, Take 40 MEQ with use of Lasix 20 mg to prevent low potassium, Disp: 180 tablet, Rfl: 0   rosuvastatin (CRESTOR) 10 MG tablet, Take 1 tablet (10 mg total) by mouth daily., Disp: 90 tablet, Rfl: 1   zolpidem (AMBIEN) 10 MG tablet, TAKE ONE TABLET BY MOUTH AT BEDTIME, Disp: 30 tablet, Rfl: 5  Physical exam: There were no vitals filed for this visit. Physical Exam Cardiovascular:     Rate and Rhythm: Normal rate and regular rhythm.     Heart sounds: Normal heart sounds.  Pulmonary:     Effort: Pulmonary effort is normal.     Breath sounds: Normal breath sounds.  Abdominal:     General: Bowel sounds are normal.     Palpations: Abdomen is soft.     Comments: No palpable hepatosplenomegaly  Lymphadenopathy:     Comments: No palpable cervical, supraclavicular, axillary or inguinal adenopathy    Skin:    General: Skin is warm and dry.  Neurological:     Mental Status: She is alert and oriented to person, place, and time.         Latest Ref Rng & Units 12/17/2022    1:35 PM  CMP  Glucose 70 - 99 mg/dL 629   BUN 8 - 27 mg/dL 18   Creatinine 5.28 - 1.00 mg/dL 4.13   Sodium 244 - 010 mmol/L 143   Potassium 3.5 - 5.2 mmol/L 4.7   Chloride 96 - 106 mmol/L 105   CO2 20 - 29 mmol/L 23   Calcium 8.7 - 10.3 mg/dL 9.5   Total Protein 6.0 - 8.5 g/dL 6.6   Total Bilirubin 0.0 - 1.2 mg/dL 0.3   Alkaline Phos 44 - 121 IU/L 112   AST 0 - 40 IU/L 24   ALT 0 - 32 IU/L 24       Latest Ref Rng & Units 12/17/2022    1:35 PM  CBC  WBC 3.4 - 10.8 x10E3/uL 15.2   Hemoglobin 11.1 - 15.9 g/dL 27.2   Hematocrit 53.6 - 46.6 % 43.3   Platelets 150 - 450 x10E3/uL 203      Assessment and plan- Patient is a 82 y.o. female who is here for follow-up of  following issues:  Rai stage 0 CLL: Mild lymphocytosis overall stable.  No B symptoms or palpable adenopathy or splenomegaly.  No other cytopenias.  CLL does not require any treatment at this time.  History of iron deficiency anemia: Received IV iron over a year ago.  Presently not anemic and iron studies are pending.  CBC ferritin and iron studies in 6 months in 1 year and I will see her back in 1 year   Visit Diagnosis 1. CLL (chronic lymphocytic leukemia) (HCC)   2. Other iron deficiency anemia      Dr. Owens Shark, MD, MPH [facility] [phone] 02/09/2023 12:51 PM

## 2023-02-09 NOTE — Addendum Note (Signed)
Addended by: Corene Cornea on: 02/09/2023 02:12 PM   Modules accepted: Orders

## 2023-02-25 ENCOUNTER — Other Ambulatory Visit: Payer: Self-pay | Admitting: Family Medicine

## 2023-02-25 DIAGNOSIS — M545 Low back pain, unspecified: Secondary | ICD-10-CM

## 2023-02-25 NOTE — Telephone Encounter (Signed)
Requested medications are due for refill today.  yes  Requested medications are on the active medications list.  yes  Last refill. 08/31/2022 #60 0 rf  Future visit scheduled.   yes  Notes to clinic.  Refill not delegated.    Requested Prescriptions  Pending Prescriptions Disp Refills   HYDROcodone-acetaminophen (NORCO/VICODIN) 5-325 MG tablet 60 tablet 0    Sig: Take 1-2 tablets by mouth daily as needed for severe pain (pain score 7-10).     Not Delegated - Analgesics:  Opioid Agonist Combinations Failed - 02/25/2023  2:05 PM      Failed - This refill cannot be delegated      Failed - Urine Drug Screen completed in last 360 days      Passed - Valid encounter within last 3 months    Recent Outpatient Visits           2 months ago Annual physical exam   [facility] Merita Norton T, FNP   5 months ago Diarrhea, unspecified type   [facility] Jacky Kindle, FNP   5 months ago Leg cramping   [facility] Merita Norton T, FNP   5 months ago Bilateral leg edema   [facility] Merita Norton T, FNP   7 months ago Numbness and tingling of left leg   [facility] Jacky Kindle, [place]

## 2023-02-25 NOTE — Telephone Encounter (Signed)
Medication Refill -  Most Recent Primary Care Visit:  Provider: Merita Norton T  Department: [facility]  Visit Type: OFFICE VISIT  Date: 12/17/2022  Medication: HYDROcodone-acetaminophen (NORCO/VICODIN) 5-325 MG tablet   Has the patient contacted their pharmacy? Yes (Agent: If no, request that the patient contact the pharmacy for the refill. If patient does not wish to contact the pharmacy document the reason why and proceed with request.) (Agent: If yes, when and what did the pharmacy advise?)  Is this the correct pharmacy for this prescription? Yes If no, delete pharmacy and type the correct one.  This is the patient's preferred pharmacy:  [facility] [address] [facility] - [place], [place] - [address] [facility] [address] [facility] [address] [place] [address] Phone: [phone] Fax: [phone]   Has the prescription been filled recently? Yes  Is the patient out of the medication? No  Has the patient been seen for an appointment in the last year OR does the patient have an upcoming appointment? Yes  Can we respond through MyChart? Yes  Agent: Please be advised that Rx refills may take up to 3 business days. We ask that you follow-up with your pharmacy.

## 2023-03-01 ENCOUNTER — Encounter: Payer: Self-pay | Admitting: Gastroenterology

## 2023-03-01 ENCOUNTER — Ambulatory Visit: Payer: Medicare PPO | Admitting: Gastroenterology

## 2023-03-01 ENCOUNTER — Other Ambulatory Visit: Payer: Self-pay | Admitting: Gastroenterology

## 2023-03-01 VITALS — BP 130/70 | HR 67 | Ht 64.5 in | Wt 199.0 lb

## 2023-03-01 DIAGNOSIS — K2100 Gastro-esophageal reflux disease with esophagitis, without bleeding: Secondary | ICD-10-CM | POA: Diagnosis not present

## 2023-03-01 DIAGNOSIS — Z860101 Personal history of adenomatous and serrated colon polyps: Secondary | ICD-10-CM

## 2023-03-01 DIAGNOSIS — Z800 Family history of malignant neoplasm of digestive organs: Secondary | ICD-10-CM

## 2023-03-01 DIAGNOSIS — K8689 Other specified diseases of pancreas: Secondary | ICD-10-CM | POA: Diagnosis not present

## 2023-03-01 MED ORDER — PANCRELIPASE (LIP-PROT-AMYL) 36000-114000 UNITS PO CPEP: ORAL_CAPSULE | ORAL | 5 refills | Status: DC

## 2023-03-01 MED ORDER — PANTOPRAZOLE SODIUM 40 MG PO TBEC: 40.0000 mg | DELAYED_RELEASE_TABLET | Freq: Every morning | ORAL | 3 refills | Status: DC

## 2023-03-01 NOTE — Progress Notes (Addendum)
Assessment     Pancreatic insufficiency - working diagnosis. R/O false result d/t watery stool IDA, resolved GERD with LA Grade B esophagitis Personal history of sessile serrated and adenomatous colon polyps, family history of colon cancer   Recommendations    Continue Creon 36,000 units 2 with meals and 1 with snacks Continue Imodium 1-2 TID prn  Low FODMAP diet If diarrhea is not well controlled consider further evaluation including colonoscopy to evaluate for microscopic colitis Continue pantoprazole 40 mg daily, follow antireflux measures Consider surveillance colonoscopy in May 2026 REV in 2 months with Dr. Doy Hutching   HPI    This is an 82 year old female for follow up of diarrhea with a low fecal elastase at 194.  Concerned this may have been a false result due to watery stool submitted.  Since beginning Creon her bowel movements have decreased from 4-5 loose stools/day to 2 loose stools/day.  They are less urgent.  They are still not well-formed.  She takes Imodium occasionally which is effective in controlling diarrhea.  Her weight has remained stable.  GI pathogen panel was negative.  Colonoscopy and EGD were performed in May 2023 including duodenal biopsies which were normal.  See December 25, 2022 office note [name], [place].   Labs / Imaging       Latest Ref Rng & Units 12/17/2022    1:35 PM 07/30/2022   11:28 AM 01/05/2022    2:45 PM  Hepatic Function  Total Protein 6.0 - 8.5 g/dL 6.6  6.7  6.4   Albumin 3.8 - 4.8 g/dL 4.0  4.4  4.4   AST 0 - 40 IU/L 24  22  29    ALT 0 - 32 IU/L 24  21  37   Alk Phosphatase 44 - 121 IU/L 112  106  91   Total Bilirubin 0.0 - 1.2 mg/dL 0.3  0.3  0.3        Latest Ref Rng & Units 02/09/2023   12:51 PM 12/17/2022    1:35 PM 08/04/2022    1:06 PM  CBC  WBC 4.0 - 10.5 K/uL 16.3  15.2  15.0   Hemoglobin 12.0 - 15.0 g/dL 28.4  13.2  44.0   Hematocrit 36.0 - 46.0 % 41.0  43.3  42.3   Platelets 150 - 400 K/uL 203  203  293       DG Abd 1 View CLINICAL DATA:  Chronic diarrhea  EXAM: ABDOMEN - 1 VIEW  COMPARISON:  CT 01/12/2018  FINDINGS: Stomach and small bowel decompressed. Moderate proximal colonic fecal material, relatively decompressed distally. No abnormal abdominal calcifications. Facet DJD L5-S1.  IMPRESSION: Nonobstructive bowel gas pattern.  Electronically Signed   By: Corlis Leak M.D.   On: 01/15/2023 16:39   Current Medications, Allergies, Past Medical History, Past Surgical History, Family History and Social History were reviewed in [employer] record.   Physical Exam: General: Well developed, well nourished, no acute distress Head: Normocephalic and atraumatic Eyes: Sclerae anicteric, EOMI Ears: Normal auditory acuity Mouth: No deformities or lesions noted Lungs: Clear throughout to auscultation Heart: Regular rate and rhythm; No murmurs, rubs or bruits Abdomen: Soft, non tender and non distended. No masses, hepatosplenomegaly or hernias noted. Normal Bowel sounds Rectal: Not done Musculoskeletal: Symmetrical with no gross deformities  Pulses:  Normal pulses noted Extremities: No edema or deformities noted Neurological: Alert oriented x 4, grossly nonfocal Psychological:  Alert and cooperative. Normal mood and affect   Cathy Barnes  Dellie Burns, MD 03/01/2023, 2:14 PM

## 2023-03-01 NOTE — Patient Instructions (Addendum)
We have sent the following medications to your pharmacy for you to pick up at your convenience: pantoprazole and Creon.   Call our office in a few weeks to schedule an appointment to see Dr. Doy Hutching in January when her schedule becomes available.   The [facility] providers would like to encourage you to use [facility] to communicate with providers for non-urgent requests or questions.  Due to long hold times on the telephone, sending your provider a message by [facility] may be a faster and more efficient way to get a response.  Please allow 48 business hours for a response.  Please remember that this is for non-urgent requests.   Thank you for choosing me and [facility].  Venita Lick. Pleas Koch., MD., Clementeen Graham

## 2023-03-02 ENCOUNTER — Other Ambulatory Visit: Payer: Self-pay | Admitting: Family Medicine

## 2023-03-02 DIAGNOSIS — M545 Low back pain, unspecified: Secondary | ICD-10-CM

## 2023-03-02 NOTE — Telephone Encounter (Signed)
Requested medication (s) are due for refill today - yes  Requested medication (s) are on the active medication list -yes  Future visit scheduled -no  Last refill: 08/31/22 #60  Notes to clinic: non delegated Rx  Requested Prescriptions  Pending Prescriptions Disp Refills   HYDROcodone-acetaminophen (NORCO/VICODIN) 5-325 MG tablet 60 tablet 0    Sig: Take 1-2 tablets by mouth daily as needed for severe pain (pain score 7-10).     Not Delegated - Analgesics:  Opioid Agonist Combinations Failed - 03/02/2023 11:02 AM      Failed - This refill cannot be delegated      Failed - Urine Drug Screen completed in last 360 days      Passed - Valid encounter within last 3 months    Recent Outpatient Visits           2 months ago Annual physical exam   [facility] Merita Norton T, FNP   5 months ago Diarrhea, unspecified type   [facility] Jacky Kindle, FNP   5 months ago Leg cramping   [facility] Merita Norton T, FNP   6 months ago Bilateral leg edema   [facility] Merita Norton T, FNP   7 months ago Numbness and tingling of left leg   [facility] Merita Norton T, [place]                 Requested Prescriptions  Pending Prescriptions Disp Refills   HYDROcodone-acetaminophen (NORCO/VICODIN) 5-325 MG tablet 60 tablet 0    Sig: Take 1-2 tablets by mouth daily as needed for severe pain (pain score 7-10).     Not Delegated - Analgesics:  Opioid Agonist Combinations Failed - 03/02/2023 11:02 AM      Failed - This refill cannot be delegated      Failed - Urine Drug Screen completed in last 360 days      Passed - Valid encounter within last 3 months    Recent Outpatient Visits           2 months ago Annual physical exam   [facility] Merita Norton T, FNP   5 months ago Diarrhea, unspecified type   [facility] Jacky Kindle, FNP   5 months ago Leg cramping   [facility] Merita Norton T, FNP   6 months ago Bilateral leg edema   [facility] Merita Norton T, FNP   7 months ago Numbness and tingling of left leg   [facility] Jacky Kindle, [place]

## 2023-03-02 NOTE — Telephone Encounter (Signed)
Medication Refill -  Most Recent Primary Care Visit:  Provider: Merita Norton T  Department: [facility]  Visit Type: OFFICE VISIT  Date: 12/17/2022  Medication: HYDROcodone-acetaminophen (NORCO/VICODIN) 5-325 MG tablet   Has the patient contacted their pharmacy? No  Is this the correct pharmacy for this prescription? Yes If no, delete pharmacy and type the correct one.  This is the patient's preferred pharmacy:  [facility] [address] [facility] - [place], [place] - [address] [facility] [address] [facility] [address] [place] [address] Phone: [phone] Fax: [phone]   Has the prescription been filled recently? Yes  Is the patient out of the medication? Yes  Has the patient been seen for an appointment in the last year OR does the patient have an upcoming appointment? Yes  Can we respond through MyChart? Yes  Agent: Please be advised that Rx refills may take up to 3 business days. We ask that you follow-up with your pharmacy.

## 2023-03-03 MED ORDER — HYDROCODONE-ACETAMINOPHEN 5-325 MG PO TABS
1.0000 | ORAL_TABLET | Freq: Every day | ORAL | 0 refills | Status: DC | PRN
Start: 2023-03-03 — End: 2023-12-10

## 2023-03-04 DIAGNOSIS — H43393 Other vitreous opacities, bilateral: Secondary | ICD-10-CM | POA: Diagnosis not present

## 2023-03-25 DIAGNOSIS — M25552 Pain in left hip: Secondary | ICD-10-CM | POA: Insufficient documentation

## 2023-03-25 DIAGNOSIS — M1612 Unilateral primary osteoarthritis, left hip: Secondary | ICD-10-CM | POA: Diagnosis not present

## 2023-04-09 DIAGNOSIS — M1612 Unilateral primary osteoarthritis, left hip: Secondary | ICD-10-CM | POA: Diagnosis not present

## 2023-04-16 ENCOUNTER — Other Ambulatory Visit: Payer: Self-pay | Admitting: Family Medicine

## 2023-04-16 DIAGNOSIS — R600 Localized edema: Secondary | ICD-10-CM

## 2023-04-21 HISTORY — PX: TOTAL HIP ARTHROPLASTY: SHX124

## 2023-04-22 ENCOUNTER — Encounter: Payer: Self-pay | Admitting: Oncology

## 2023-05-02 ENCOUNTER — Other Ambulatory Visit: Payer: Self-pay | Admitting: Nurse Practitioner

## 2023-05-03 ENCOUNTER — Telehealth: Payer: Self-pay | Admitting: Family Medicine

## 2023-05-03 ENCOUNTER — Telehealth: Payer: Self-pay | Admitting: Cardiovascular Disease

## 2023-05-03 DIAGNOSIS — M1612 Unilateral primary osteoarthritis, left hip: Secondary | ICD-10-CM | POA: Diagnosis not present

## 2023-05-03 DIAGNOSIS — Z0189 Encounter for other specified special examinations: Secondary | ICD-10-CM | POA: Diagnosis not present

## 2023-05-03 NOTE — Telephone Encounter (Signed)
 I tried to call the pt back to schedule tele, got her vm.

## 2023-05-03 NOTE — Telephone Encounter (Signed)
 Left message to call back to schedule tele pre op appt.

## 2023-05-03 NOTE — Telephone Encounter (Signed)
 Patient returned Pre-op call to schedule tele-visit.

## 2023-05-03 NOTE — Telephone Encounter (Signed)
   Pre-operative Risk Assessment    Patient Name: Cathy Barnes  DOB: May 11, 1940 MRN: [id number]   Date of last office visit: 02/02/23 Date of next office visit: n/a   Request for Surgical Clearance    Procedure:   Lt THA anterior hip at [facility]  Date of Surgery:  Clearance 07/15/23                                Surgeon:  Dr Lynwood Sick Surgeon's Group or Practice Name:  [facility] Phone number:  [phone] Fax number:  [phone]   Type of Clearance Requested:   - Pharmacy:  Hold Aspirin 81 mg instructions   Type of Anesthesia:  Local [place]   Additional requests/questions:    Bonney Rosina Stamps   05/03/2023, 2:26 PM

## 2023-05-03 NOTE — Telephone Encounter (Signed)
 [facility] faxed refill request for the following medications:   Cholecalciferol (VITAMIN D3) 1.25 MG (50000 UT) CAPS    Please advise.

## 2023-05-03 NOTE — Telephone Encounter (Signed)
   Name: CANDID BOVEY  DOB: [birth year]  MRN: [id number]  Primary Cardiologist: Deatrice Cage, MD Last seen on 02/02/2023  Preoperative team, please contact this patient and set up a phone call appointment for further preoperative risk assessment. Please obtain consent and complete medication review. Thank you for your help.  I confirm that guidance regarding antiplatelet and oral anticoagulation therapy has been completed and, if necessary, noted below.Patient is not on anticoagulation therapy or antiplatelet therapy per notes and med list.   I also confirmed the patient resides in the state of [place] . As per [facility] Medical Board telemedicine laws, the patient must reside in the state in which the provider is licensed.   Lamarr Satterfield, NP 05/03/2023, 3:52 PM [facility]

## 2023-05-04 ENCOUNTER — Telehealth: Payer: Self-pay

## 2023-05-04 ENCOUNTER — Other Ambulatory Visit: Payer: Self-pay | Admitting: Family Medicine

## 2023-05-04 DIAGNOSIS — E559 Vitamin D deficiency, unspecified: Secondary | ICD-10-CM

## 2023-05-04 MED ORDER — VITAMIN D3 1.25 MG (50000 UT) PO CAPS: 1.0000 | ORAL_CAPSULE | ORAL | 3 refills | Status: AC

## 2023-05-04 NOTE — Telephone Encounter (Signed)
 Patient called back and was scheduled for preop clearance telephone visit

## 2023-05-04 NOTE — Telephone Encounter (Signed)
 Patient called back and was scheduled for surgical clearance telephone visit med rec and consent done     Patient Consent for Virtual Visit         Cathy Barnes has provided verbal consent on 05/04/2023 for a virtual visit (video or telephone).   CONSENT FOR VIRTUAL VISIT FOR:  Cathy Barnes  By participating in this virtual visit I agree to the following:  I hereby voluntarily request, consent and authorize [facility] and its employed or contracted physicians, physician assistants, nurse practitioners or other licensed health care professionals (the Practitioner), to provide me with telemedicine health care services (the "Services) as deemed necessary by the treating Practitioner. I acknowledge and consent to receive the Services by the Practitioner via telemedicine. I understand that the telemedicine visit will involve communicating with the Practitioner through live audiovisual communication technology and the disclosure of certain medical information by electronic transmission. I acknowledge that I have been given the opportunity to request an in-person assessment or other available alternative prior to the telemedicine visit and am voluntarily participating in the telemedicine visit.  I understand that I have the right to withhold or withdraw my consent to the use of telemedicine in the course of my care at any time, without affecting my right to future care or treatment, and that the Practitioner or I may terminate the telemedicine visit at any time. I understand that I have the right to inspect all information obtained and/or recorded in the course of the telemedicine visit and may receive copies of available information for a reasonable fee.  I understand that some of the potential risks of receiving the Services via telemedicine include:  Delay or interruption in medical evaluation due to technological equipment failure or disruption; Information transmitted may not be  sufficient (e.g. poor resolution of images) to allow for appropriate medical decision making by the Practitioner; and/or  In rare instances, security protocols could fail, causing a breach of personal health information.  Furthermore, I acknowledge that it is my responsibility to provide information about my medical history, conditions and care that is complete and accurate to the best of my ability. I acknowledge that Practitioner's advice, recommendations, and/or decision may be based on factors not within their control, such as incomplete or inaccurate data provided by me or distortions of diagnostic images or specimens that may result from electronic transmissions. I understand that the practice of medicine is not an exact science and that Practitioner makes no warranties or guarantees regarding treatment outcomes. I acknowledge that a copy of this consent can be made available to me via my patient portal [facility] MyChart), or I can request a printed copy by calling the office of [facility].    I understand that my insurance will be billed for this visit.   I have read or had this consent read to me. I understand the contents of this consent, which adequately explains the benefits and risks of the Services being provided via telemedicine.  I have been provided ample opportunity to ask questions regarding this consent and the Services and have had my questions answered to my satisfaction. I give my informed consent for the services to be provided through the use of telemedicine in my medical care

## 2023-05-14 DIAGNOSIS — Z0189 Encounter for other specified special examinations: Secondary | ICD-10-CM | POA: Diagnosis not present

## 2023-06-16 ENCOUNTER — Ambulatory Visit: Payer: Medicare PPO | Attending: Nurse Practitioner | Admitting: Nurse Practitioner

## 2023-06-16 ENCOUNTER — Encounter: Payer: Self-pay | Admitting: Nurse Practitioner

## 2023-06-16 VITALS — BP 152/85 | HR 73 | Ht 64.5 in | Wt 204.2 lb

## 2023-06-16 DIAGNOSIS — Z01810 Encounter for preprocedural cardiovascular examination: Secondary | ICD-10-CM

## 2023-06-16 DIAGNOSIS — N183 Chronic kidney disease, stage 3 unspecified: Secondary | ICD-10-CM

## 2023-06-16 DIAGNOSIS — I10 Essential (primary) hypertension: Secondary | ICD-10-CM

## 2023-06-16 DIAGNOSIS — E782 Mixed hyperlipidemia: Secondary | ICD-10-CM | POA: Diagnosis not present

## 2023-06-16 MED ORDER — CARVEDILOL 25 MG PO TABS: 25.0000 mg | ORAL_TABLET | Freq: Two times a day (BID) | ORAL | 1 refills | Status: DC

## 2023-06-16 NOTE — Patient Instructions (Addendum)
 Medication Instructions:  INCREASE the Carvedilol to 25 mg twice daily  *If you need a refill on your cardiac medications before your next appointment, please call your pharmacy*   Lab Work: None ordered If you have labs (blood work) drawn today and your tests are completely normal, you will receive your results only by: MyChart Message (if you have MyChart) OR A paper copy in the mail If you have any lab test that is abnormal or we need to change your treatment, we will call you to review the results.   Testing/Procedures: None ordered   Follow-Up: At [facility], you and your health needs are our priority.  As part of our continuing mission to provide you with exceptional heart care, we have created designated Provider Care Teams.  These Care Teams include your primary Cardiologist (physician) and Advanced Practice Providers (APPs -  Physician Assistants and Nurse Practitioners) who all work together to provide you with the care you need, when you need it.  We recommend signing up for the patient portal called "MyChart".  Sign up information is provided on this After Visit Summary.  MyChart is used to connect with patients for Virtual Visits (Telemedicine).  Patients are able to view lab/test results, encounter notes, upcoming appointments, etc.  Non-urgent messages can be sent to your provider as well.   To learn more about what you can do with MyChart, go to [email or web address].    Your next appointment:   6 month(s)  Provider:   Lorine Bears, MD

## 2023-06-16 NOTE — Progress Notes (Signed)
 Office Visit    Patient Name: Cathy Barnes Date of Encounter: 06/16/2023  Primary Care Provider:  Sherlyn Hay, DO Primary Cardiologist:  Lorine Bears, MD  Chief Complaint    83 y.o. female with a history of hypertension, hyperlipidemia, borderline diabetes, hypothyroidism, CKD 3, and chronic lymphocytic leukemia, who presents for follow-up related to hypertension and preop eval.  Past Medical History  Subjective   Past Medical History:  Diagnosis Date   (HFpEF) heart failure with preserved ejection fraction (HCC)    a. 08/2019 Echo: EF 55-60%, no rwma, Gr1 DD, nl RV size/fxn; b. 09/2022 Echo: EF 60-65%, no rwma, GrI Dd, nl RV fxn, mildly dil LA. no significant valvular disease.   Allergy    some   Cataract    bilaterally both eyes    Chest pain    a. 07/2019 MV: EF 77%, no ischemia/infarct.   CKD (chronic kidney disease), stage III (HCC)    CLL (chronic lymphocytic leukemia) (HCC)    stage 0   Gallstones    GERD (gastroesophageal reflux disease)    H/O blood clots    Hydrocephalus (HCC)    [facility]    Hyperglycemia    Hyperlipidemia    Hypertension    a. 03/2022 Renal Duplex: No RAS.   Lymphocytosis    monoclonal b cell   Migraine, unspecified, without mention of intractable migraine without mention of status migrainosus 12/14/2012   Osteoarthritis, chronic    Osteoporosis    Peptic ulcer    some bleeding with ulcers as well    Past Surgical History:  Procedure Laterality Date   ABDOMINAL HYSTERECTOMY     BREAST CYST ASPIRATION Left 1993   Removed   breast cyst removed Right    benign    BREAST LUMPECTOMY Bilateral    COLONOSCOPY     ERCP N/A 02/04/2018   Procedure: ENDOSCOPIC RETROGRADE CHOLANGIOPANCREATOGRAPHY (ERCP);  Surgeon: Midge Minium, MD;  Location: [facility] ENDOSCOPY;  Service: Endoscopy;  Laterality: N/A;   ERCP N/A 02/28/2018   Procedure: ENDOSCOPIC RETROGRADE CHOLANGIOPANCREATOGRAPHY (ERCP) STENT REMOVAL;  Surgeon: Midge Minium, MD;   Location: [facility] ENDOSCOPY;  Service: Endoscopy;  Laterality: N/A;   EYE SURGERY     growth removal Left 2019   L wrist growth removed, abnormal cells   History of Cataract surgery Right 2011   left 2011   PARTIAL HYSTERECTOMY     POLYPECTOMY     ROBOTIC ASSISTED LAPAROSCOPIC CHOLECYSTECTOMY-MULTI SITE N/A 03/08/2018   Procedure: ROBOTIC ASSISTED LAPAROSCOPIC CHOLECYSTECTOMY-MULTI SITE;  Surgeon: Leafy Ro, MD;  Location: [facility] ORS;  Service: General;  Laterality: N/A;   S/P ACL repair Left    knee   TONSILLECTOMY     TUBAL LIGATION  1975   Bilateral    Allergies  Allergies  Allergen Reactions   Shellfish-Derived Products Anaphylaxis    Throat closes, rash   Hydrochlorothiazide Diarrhea    Microzide branding    Amlodipine     Lower ext edema   Lasix [Furosemide]     Profound itching   Shellfish Allergy     Throat closes, rash   Latex Rash    Tapes,adhesives      History of Present Illness      83 y.o. y/o female with history of hypertension, hyperlipidemia, borderline diabetes, hypothyroidism, CKD III, and chronic lymphocytic leukemia.  She was previously evaluated by cardiology almost 20 years ago in the setting of chest pain and reportedly underwent stress testing and echocardiography which  showed no significant abnormalities.  In April 2021, she was seen by Dr. Kirke Corin in the setting of dyspnea occurring with minimal activity as well as occasional right-sided chest squeezing and discomfort with exertion.  EKG was unremarkable.  Stress testing was performed and showed no ischemia or infarct.  Echo showed normal LV function with grade 1 diastolic dysfunction and no significant valvular abnormalities.  In October 2022, she was diagnosed with left popliteal DVT and was treated w/ 12 months of eliquis - she is no longer taking.  In the setting of poorly controlled hypertension, she underwent renal artery duplex in December 2023, which was negative for renal artery stenosis.   In  the setting of lower extremity edema in the summer of 2024, she required dose reduction and subsequent discontinuation of amlodipine therapy, with improvement in lower extremity swelling.  She did not tolerate Lasix secondary to itching.  An echo was performed and showed normal LV function with grade 1 diastolic dysfunction and no significant valvular disease.      Patient was last seen by me in cardiology clinic in October of 2024. Ms. Moffitt has done well from a cardiac standpoint since the last visit. Her blood pressure today it is 152/85 although, patient states to have had a stressful today d/t her car not starting. She states her blood pressure has been typically running in the 130-140s/70 range when taken at home. She denies chest pain, dyspnea, palpitations, PND, orthopnea, dizziness, syncope, edema, or early satiety. She is pending left hip surgery through [facility].  Activity is limited by chronic low back pain and left hip pain. Despite this, she is able to perform household chores, including moving furniture, mopping floors, etc.  She can also walk up a flight of stairs w/o difficulty.  Objective  Home Medications    Current Outpatient Medications  Medication Sig Dispense Refill   ALPRAZolam (XANAX) 0.25 MG tablet TAKE ONE TABLET BY MOUTH AT BEDTIME 90 tablet 1   Cholecalciferol (VITAMIN D3) 1.25 MG (50000 UT) CAPS Take 1 capsule (1.25 mg total) by mouth once a week. 12.852 capsule 3   cyclobenzaprine (FLEXERIL) 10 MG tablet Take 1 tablet (10 mg total) by mouth at bedtime as needed for muscle spasms. 90 tablet 3   donepezil (ARICEPT) 10 MG tablet TAKE ONE TABLET BY MOUTH AT BEDTIME 90 tablet 4   ezetimibe (ZETIA) 10 MG tablet TAKE ONE TABLET BY MOUTH AT BEDTIME 90 tablet 1   gabapentin (NEURONTIN) 600 MG tablet Take 1 tablet (600 mg total) by mouth 2 (two) times daily. 180 tablet 3   HYDROcodone-acetaminophen (NORCO/VICODIN) 5-325 MG tablet Take 1-2 tablets by mouth daily as needed  for severe pain (pain score 7-10). 60 tablet 0   levothyroxine (SYNTHROID) 112 MCG tablet TAKE ONE TABLET BY MOUTH ONE TIME DAILY BEFORE BREAKFAST 90 tablet 3   lipase/protease/amylase (CREON) 36000 UNITS CPEP capsule Take 2 capsules by mouth three times a day, take 1 capsule by mouth with a snack with max 3 snacks per day 270 capsule 5   losartan (COZAAR) 100 MG tablet TAKE ONE TABLET BY MOUTH ONE TIME DAILY 90 tablet 0   pantoprazole (PROTONIX) 40 MG tablet Take 1 tablet (40 mg total) by mouth every morning. 90 tablet 3   potassium chloride SA (KLOR-CON M) 20 MEQ tablet TAKE TWO TABLETS BY MOUTH WITH USE OF LASIX 20 MG TO PREVENT LOW POTASSIUM 180 tablet 0   rosuvastatin (CRESTOR) 10 MG tablet Take 1 tablet (10 mg  total) by mouth daily. 90 tablet 1   zolpidem (AMBIEN) 10 MG tablet TAKE ONE TABLET BY MOUTH AT BEDTIME 30 tablet 5   carvedilol (COREG) 25 MG tablet Take 1 tablet (25 mg total) by mouth 2 (two) times daily with a meal. 180 tablet 1   No current facility-administered medications for this visit.     Physical Exam    VS:  BP (!) 152/85   Pulse 73   Ht 5' 4.5" (1.638 m)   Wt 204 lb 3.2 oz (92.6 kg)   SpO2 94%   BMI 34.51 kg/m  , BMI Body mass index is 34.51 kg/m.     Vitals:   06/16/23 1448 06/16/23 1509  BP: (!) 150/85 (!) 152/85  Pulse: 73   SpO2: 94%       GEN: Well nourished, well developed, in no acute distress. HEENT: normal. Neck: Supple, no JVD, carotid bruits, or masses. Cardiac: RRR, no murmurs, rubs, or gallops. No clubbing, cyanosis, edema.  Radials 2+/PT 2+ and equal bilaterally.  Respiratory:  Respirations regular and unlabored, clear to auscultation bilaterally. GI: Soft, nontender, nondistended, BS + x 4. MS: no deformity or atrophy. Skin: warm and dry, no rash. Neuro:  Strength and sensation are intact. Psych: Normal affect.  Accessory Clinical Findings    Lab Results  Component Value Date   WBC 16.3 (H) 02/09/2023   HGB 13.7 02/09/2023   HCT  41.0 02/09/2023   MCV 89.1 02/09/2023   PLT 203 02/09/2023   Lab Results  Component Value Date   CREATININE 1.16 (H) 12/17/2022   BUN 18 12/17/2022   NA 143 12/17/2022   K 4.7 12/17/2022   CL 105 12/17/2022   CO2 23 12/17/2022   Lab Results  Component Value Date   ALT 24 12/17/2022   AST 24 12/17/2022   GGT 24 12/17/2022   ALKPHOS 112 12/17/2022   BILITOT 0.3 12/17/2022   Lab Results  Component Value Date   CHOL 174 12/17/2022   HDL 41 12/17/2022   LDLCALC 94 12/17/2022   TRIG 232 (H) 12/17/2022   CHOLHDL 4.2 12/17/2022    Lab Results  Component Value Date   HGBA1C 6.5 (H) 12/17/2022   Lab Results  Component Value Date   TSH 2.190 12/17/2022      Assessment & Plan    1.  Primary hypertension: Patient today presents with BP of 150/85. She states to take her BP at home which ranges from 130s to 140/80s. Will increase her carvedilol 25mg  twice daily. She also remains on losartan 100 mg daily.   2.  Hyperlipidemia: She remains on Crestor and Zetia with an LDL of 94 in August of 2024. LFTs normal at that time.   3.  Stage III chronic kidney disease: Creatinine stable at 1.16 in August 2024. She remains on ARB therapy.   4. Preoperative cardiovascular evaluation:: Patient is pending total hip arthroplasty in the near future.  Patient is able walk 1-2 blocks on level ground and can climb a flight of stairs or walk up a hill without difficulty. No further ischemic evaluation is warranted prior to her surgery, as she is low risk for cardiac complications.  Continue carvedilol  and statin rx throughout perioperative period.   5.  Disposition: Follow-up in 6 months or sooner if necessary.   Nicolasa Ducking, NP 06/16/2023, 5:28 PM

## 2023-06-21 ENCOUNTER — Telehealth: Payer: Self-pay | Admitting: Family Medicine

## 2023-06-21 NOTE — Telephone Encounter (Signed)
 [facility] faxed refill request for the following medications:   rosuvastatin (CRESTOR) 10 MG tablet    Please advise.

## 2023-06-22 ENCOUNTER — Other Ambulatory Visit: Payer: Self-pay

## 2023-06-22 DIAGNOSIS — E1169 Type 2 diabetes mellitus with other specified complication: Secondary | ICD-10-CM

## 2023-06-23 ENCOUNTER — Telehealth: Payer: Self-pay | Admitting: Family Medicine

## 2023-06-23 ENCOUNTER — Ambulatory Visit (INDEPENDENT_AMBULATORY_CARE_PROVIDER_SITE_OTHER): Payer: Medicare PPO

## 2023-06-23 ENCOUNTER — Telehealth: Payer: Self-pay | Admitting: *Deleted

## 2023-06-23 DIAGNOSIS — Z01810 Encounter for preprocedural cardiovascular examination: Secondary | ICD-10-CM

## 2023-06-23 NOTE — Telephone Encounter (Signed)
 I called the pt and confirmed with the pt that I have indeed cancelled the tele for today. Pt thanked me for letting her know.

## 2023-06-23 NOTE — Telephone Encounter (Signed)
 [facility] is requesting refill rosuvastatin (CRESTOR) 10 MG tablet  Please advise

## 2023-06-23 NOTE — Telephone Encounter (Signed)
 Robin Searing, NP: [name] the 2:00 [name] was seen by Ward Givens on 06/16/2023 and was cleared.  You can cancel her call for today   I left message for the pt that the tele appt for today is not longer needed per the preop APP, see notes above.

## 2023-06-23 NOTE — Progress Notes (Signed)
 No charge visit was canceled

## 2023-06-23 NOTE — Telephone Encounter (Signed)
 Patient returned Pre-op call to confirm canceled appointment.

## 2023-06-24 ENCOUNTER — Other Ambulatory Visit: Payer: Self-pay

## 2023-06-24 DIAGNOSIS — E785 Hyperlipidemia, unspecified: Secondary | ICD-10-CM

## 2023-06-24 MED ORDER — ROSUVASTATIN CALCIUM 10 MG PO TABS: 10.0000 mg | ORAL_TABLET | Freq: Every day | ORAL | 0 refills | Status: DC

## 2023-06-29 ENCOUNTER — Other Ambulatory Visit: Payer: Self-pay

## 2023-06-29 ENCOUNTER — Telehealth: Payer: Self-pay | Admitting: Family Medicine

## 2023-06-29 DIAGNOSIS — E7800 Pure hypercholesterolemia, unspecified: Secondary | ICD-10-CM

## 2023-06-29 DIAGNOSIS — F5104 Psychophysiologic insomnia: Secondary | ICD-10-CM

## 2023-06-29 NOTE — Telephone Encounter (Signed)
 [facility] ezetimibe (ZETIA) 10 MG tablet  Please advise

## 2023-06-29 NOTE — Telephone Encounter (Signed)
 [facility] requesting refill ALPRAZolam (XANAX) 0.25 MG tablet  Please advise

## 2023-06-29 NOTE — Telephone Encounter (Signed)
 [facility] is requesting refill zolpidem (AMBIEN) 10 MG tablet  Please advise

## 2023-07-01 ENCOUNTER — Other Ambulatory Visit: Payer: Self-pay

## 2023-07-01 ENCOUNTER — Telehealth: Payer: Self-pay | Admitting: Family Medicine

## 2023-07-01 ENCOUNTER — Other Ambulatory Visit: Payer: Self-pay | Admitting: Family Medicine

## 2023-07-01 DIAGNOSIS — F5104 Psychophysiologic insomnia: Secondary | ICD-10-CM

## 2023-07-01 DIAGNOSIS — E7800 Pure hypercholesterolemia, unspecified: Secondary | ICD-10-CM

## 2023-07-01 MED ORDER — EZETIMIBE 10 MG PO TABS: 10.0000 mg | ORAL_TABLET | Freq: Every day | ORAL | 0 refills | Status: DC

## 2023-07-01 NOTE — Telephone Encounter (Signed)
 [facility] faxed refill request for the following medications:   zolpidem (AMBIEN) 10 MG tablet   Please advise

## 2023-07-01 NOTE — Telephone Encounter (Unsigned)
 Copied from CRM [id number]. Topic: Clinical - Medication Refill >> Jul 01, 2023  4:37 PM Priscille Loveless wrote: Most Recent Primary Care Visit:  Provider: Merita Norton T  Department: [id number]  Visit Type: OFFICE VISIT  Date: 12/17/2022  Medication:  zolpidem (AMBIEN) 10 MG tablet [[id number]  ALPRAZolam (XANAX) 0.25 MG tablet  Has the patient contacted their pharmacy? Yes   Is this the correct pharmacy for this prescription? Yes   This is the patient's preferred pharmacy:  [facility] [address] - [place], [place] - [address] [place] [address] Phone: [phone] Fax: [phone]   Has the prescription been filled recently? Yes  Is the patient out of the medication? Yes  Has the patient been seen for an appointment in the last year OR does the patient have an upcoming appointment? Yes  Can we respond through MyChart? No  Agent: Please be advised that Rx refills may take up to 3 business days. We ask that you follow-up with your pharmacy.

## 2023-07-01 NOTE — Telephone Encounter (Signed)
 [facility] is requesting refill ezetimibe (ZETIA) 10 MG tablet   & ALPRAZolam (XANAX) 0.25 MG tablet   Please advise

## 2023-07-02 MED ORDER — EZETIMIBE 10 MG PO TABS: 10.0000 mg | ORAL_TABLET | Freq: Every day | ORAL | 3 refills | Status: AC

## 2023-07-02 MED ORDER — ZOLPIDEM TARTRATE 10 MG PO TABS
10.0000 mg | ORAL_TABLET | Freq: Every day | ORAL | 0 refills | Status: DC
Start: 2023-07-02 — End: 2023-07-29

## 2023-07-02 MED ORDER — ALPRAZOLAM 0.25 MG PO TABS: 0.2500 mg | ORAL_TABLET | Freq: Every day | ORAL | 1 refills | Status: DC

## 2023-07-28 ENCOUNTER — Ambulatory Visit: Admitting: Physician Assistant

## 2023-07-28 ENCOUNTER — Ambulatory Visit: Payer: Self-pay

## 2023-07-28 ENCOUNTER — Ambulatory Visit: Admitting: Family Medicine

## 2023-07-28 NOTE — Telephone Encounter (Signed)
 Copied from CRM [id number]. Topic: Clinical - Red Word Triage >> Jul 28, 2023  7:38 AM Izetta Dakin wrote: [facility] that prompted transfer to Nurse Triage: Increased Cough, fever  Chief Complaint: cough, fever Symptoms: temp 100, cough w/ coughing spells at times, sputum at times greenish-yellow Frequency: x 3 weeks Pertinent Negatives: Patient denies chest pain or headaches Disposition: [] ED /[] Urgent Care (no appt availability in office) / [x] Appointment(In office/virtual)/ []  [facility]/ [] Home Care/ [] Refused Recommended Disposition /[] [facility]/ []  Follow-up with PCP Additional Notes: taking tylenol for temp: pt states temp comes and goes. C/o runny nose  Reason for Disposition  Cough has been present for > 3 weeks  [1] Fever comes and goes (intermittent) AND [2] lasts > 3 weeks  Answer Assessment - Initial Assessment Questions 1. ONSET: "When did the cough begin?"      X 3 weeks 2. SEVERITY: "How bad is the cough today?"      Moderate to severe 3. SPUTUM: "Describe the color of your sputum" (none, dry cough; clear, white, yellow, green)     Greenish yellow 4. HEMOPTYSIS: "Are you coughing up any blood?" If so ask: "How much?" (flecks, streaks, tablespoons, etc.)     no 5. DIFFICULTY BREATHING: "Are you having difficulty breathing?" If Yes, ask: "How bad is it?" (e.g., mild, moderate, severe)    - MILD: No SOB at rest, mild SOB with walking, speaks normally in sentences, can lie down, no retractions, pulse < 100.    - MODERATE: SOB at rest, SOB with minimal exertion and prefers to sit, cannot lie down flat, speaks in phrases, mild retractions, audible wheezing, pulse 100-120.    - SEVERE: Very SOB at rest, speaks in single words, struggling to breathe, sitting hunched forward, retractions, pulse > 120      no 6. FEVER: "Do you have a fever?" If Yes, ask: "What is your temperature, how was it measured, and when did it start?"     100 7. CARDIAC HISTORY: "Do you  have any history of heart disease?" (e.g., heart attack, congestive heart failure)      N/a 8. LUNG HISTORY: "Do you have any history of lung disease?"  (e.g., pulmonary embolus, asthma, emphysema)     no 9. PE RISK FACTORS: "Do you have a history of blood clots?" (or: recent major surgery, recent prolonged travel, bedridden)     07/15/2023 had hip replacement 10. OTHER SYMPTOMS: "Do you have any other symptoms?" (e.g., runny nose, wheezing, chest pain)       Runny nose, chest congestion 11. PREGNANCY: "Is there any chance you are pregnant?" "When was your last menstrual period?"       N/a 12. TRAVEL: "Have you traveled out of the country in the last month?" (e.g., travel history, exposures)       N/a  Answer Assessment - Initial Assessment Questions 1. TEMPERATURE: "What is the most recent temperature?"  "How was it measured?"      100 2. ONSET: "When did the fever start?"      X 3 weeks 3. CHILLS: "Do you have chills?" If yes: "How bad are they?"  (e.g., none, mild, moderate, severe)   - NONE: no chills   - MILD: feeling cold   - MODERATE: feeling very cold, some shivering (feels better under a thick blanket)   - SEVERE: feeling extremely cold with shaking chills (general body shaking, rigors; even under a thick blanket)      none 4.  OTHER SYMPTOMS: "Do you have any other symptoms besides the fever?"  (e.g., abdomen pain, cough, diarrhea, earache, headache, sore throat, urination pain)     Cough, sore throat,  5. CAUSE: If there are no symptoms, ask: "What do you think is causing the fever?"      Cold s/s 6. CONTACTS: "Does anyone else in the family have an infection?"     N/a 7. TREATMENT: "What have you done so far to treat this fever?" (e.g., medications)     tylenol 8. IMMUNOCOMPROMISE: "Do you have of the following: diabetes, HIV positive, splenectomy, cancer chemotherapy, chronic steroid treatment, transplant patient, etc."     N/a 9. PREGNANCY: "Is there any chance you are  pregnant?" "When was your last menstrual period?"     N/a 10. TRAVEL: "Have you traveled out of the country in the last month?" (e.g., travel history, exposures)       N/a  Protocols used: Cough - Acute Productive-A-AH, Fever-A-AH

## 2023-07-29 ENCOUNTER — Ambulatory Visit: Admitting: Physician Assistant

## 2023-07-29 ENCOUNTER — Encounter: Payer: Self-pay | Admitting: Physician Assistant

## 2023-07-29 VITALS — BP 134/49 | HR 63 | Temp 98.4°F | Resp 16 | Ht 64.0 in | Wt 203.0 lb

## 2023-07-29 DIAGNOSIS — F5104 Psychophysiologic insomnia: Secondary | ICD-10-CM | POA: Diagnosis not present

## 2023-07-29 DIAGNOSIS — J0120 Acute ethmoidal sinusitis, unspecified: Secondary | ICD-10-CM

## 2023-07-29 DIAGNOSIS — E785 Hyperlipidemia, unspecified: Secondary | ICD-10-CM

## 2023-07-29 DIAGNOSIS — I10 Essential (primary) hypertension: Secondary | ICD-10-CM | POA: Diagnosis not present

## 2023-07-29 DIAGNOSIS — E1169 Type 2 diabetes mellitus with other specified complication: Secondary | ICD-10-CM | POA: Diagnosis not present

## 2023-07-29 DIAGNOSIS — K219 Gastro-esophageal reflux disease without esophagitis: Secondary | ICD-10-CM

## 2023-07-29 DIAGNOSIS — Z96649 Presence of unspecified artificial hip joint: Secondary | ICD-10-CM

## 2023-07-29 MED ORDER — LOSARTAN POTASSIUM 100 MG PO TABS: 100.0000 mg | ORAL_TABLET | Freq: Every day | ORAL | 0 refills | Status: DC

## 2023-07-29 MED ORDER — IPRATROPIUM BROMIDE 0.03 % NA SOLN: 2.0000 | Freq: Two times a day (BID) | NASAL | 0 refills | Status: DC

## 2023-07-29 MED ORDER — ALBUTEROL SULFATE HFA 108 (90 BASE) MCG/ACT IN AERS: 2.0000 | INHALATION_SPRAY | Freq: Four times a day (QID) | RESPIRATORY_TRACT | 2 refills | Status: DC | PRN

## 2023-07-29 MED ORDER — ZOLPIDEM TARTRATE 10 MG PO TABS: 10.0000 mg | ORAL_TABLET | Freq: Every day | ORAL | 0 refills | Status: DC

## 2023-07-29 MED ORDER — ROSUVASTATIN CALCIUM 10 MG PO TABS: 10.0000 mg | ORAL_TABLET | Freq: Every day | ORAL | 0 refills | Status: DC

## 2023-07-29 MED ORDER — AMOXICILLIN-POT CLAVULANATE 875-125 MG PO TABS
1.0000 | ORAL_TABLET | Freq: Two times a day (BID) | ORAL | 0 refills | Status: DC
Start: 2023-07-29 — End: 2023-08-11

## 2023-07-29 MED ORDER — FLUCONAZOLE 150 MG PO TABS: ORAL_TABLET | ORAL | 1 refills | Status: DC

## 2023-07-29 NOTE — Progress Notes (Signed)
 Established patient visit  [name]   DOB: [birth year]   83 y.o. Female  MRN: [id number] Visit Date: 07/29/2023  Today's healthcare provider: Blane Bunting, PA-C   Chief Complaint  Patient presents with   Cough    Cough and fever on and for 4wks   Subjective       Discussed the use of AI scribe software for clinical note transcription with the patient, who gave verbal consent to proceed.  History of Present Illness The patient, with a history of hypertension, acid reflux, and seasonal allergies, presents with a persistent cough, runny nose, and intermittent fever for about four weeks. The symptoms were at her best during the week of her hip replacement surgery. The cough is described as deep, more pronounced in the late afternoon and at night, and occasionally produces greenish-yellow sputum. The patient denies any blood in the sputum. The patient also reports a runny nose and has been using lozenges for a sore throat. She has experienced some ear discomfort on the right side but denies any significant ear pain. The patient has been using over-the-counter Mucinex for symptom relief but reports minimal improvement. The patient also reports a history of high blood pressure, typically around 140-150/80s, and is on losartan twice daily. However, the blood pressure was noted to be unusually low at 134/47 during the visit. The patient also has a history of acid reflux for which she is on medication, though the specific medication is not recalled. The patient denies any history of smoking or exposure to second-hand smoke.       07/29/2023   10:24 AM 12/17/2022    1:13 PM 09/24/2022   11:06 AM  Depression screen PHQ 2/9  Decreased Interest 1 1 1   Down, Depressed, Hopeless  0 0  PHQ - 2 Score 1 1 1   Altered sleeping 1 1 1   Tired, decreased energy 1 1 0  Change in appetite 0  0  Feeling bad or failure about yourself  0 0 0  Trouble concentrating 0 0 0  Moving slowly or  fidgety/restless 0 0 0  Suicidal thoughts 0 0 0  PHQ-9 Score 3 3 2   Difficult doing work/chores Somewhat difficult Not difficult at all Not difficult at all      07/29/2023   10:24 AM 12/17/2022    1:13 PM  GAD 7 : Generalized Anxiety Score  Nervous, Anxious, on Edge 1 0  Control/stop worrying 1 0  Worry too much - different things 0 0  Trouble relaxing 1 0  Restless 0 0  Easily annoyed or irritable 1 0  Afraid - awful might happen 0 0  Total GAD 7 Score 4 0  Anxiety Difficulty Not difficult at all     Medications: Outpatient Medications Prior to Visit  Medication Sig   ALPRAZolam (XANAX) 0.25 MG tablet Take 1 tablet (0.25 mg total) by mouth at bedtime.   carvedilol (COREG) 25 MG tablet Take 1 tablet (25 mg total) by mouth 2 (two) times daily with a meal.   Cholecalciferol (VITAMIN D3) 1.25 MG (50000 UT) CAPS Take 1 capsule (1.25 mg total) by mouth once a week.   cyclobenzaprine (FLEXERIL) 10 MG tablet Take 1 tablet (10 mg total) by mouth at bedtime as needed for muscle spasms.   donepezil (ARICEPT) 10 MG tablet TAKE ONE TABLET BY MOUTH AT BEDTIME   ezetimibe (ZETIA) 10 MG tablet Take 1 tablet (10 mg total) by mouth at bedtime.   gabapentin (NEURONTIN) 600  MG tablet Take 1 tablet (600 mg total) by mouth 2 (two) times daily.   HYDROcodone-acetaminophen (NORCO/VICODIN) 5-325 MG tablet Take 1-2 tablets by mouth daily as needed for severe pain (pain score 7-10).   levothyroxine (SYNTHROID) 112 MCG tablet TAKE ONE TABLET BY MOUTH ONE TIME DAILY BEFORE BREAKFAST   lipase/protease/amylase (CREON) 36000 UNITS CPEP capsule Take 2 capsules by mouth three times a day, take 1 capsule by mouth with a snack with max 3 snacks per day   losartan (COZAAR) 100 MG tablet TAKE ONE TABLET BY MOUTH ONE TIME DAILY   pantoprazole (PROTONIX) 40 MG tablet Take 1 tablet (40 mg total) by mouth every morning.   potassium chloride SA (KLOR-CON M) 20 MEQ tablet TAKE TWO TABLETS BY MOUTH WITH USE OF LASIX 20 MG TO  PREVENT LOW POTASSIUM   rosuvastatin (CRESTOR) 10 MG tablet Take 1 tablet (10 mg total) by mouth daily.   zolpidem (AMBIEN) 10 MG tablet Take 1 tablet (10 mg total) by mouth at bedtime.   No facility-administered medications prior to visit.    Review of Systems All negative Except see HPI       Objective    BP (!) 134/49 (BP Location: Right Arm, Patient Position: Sitting)   Pulse 63   Resp 16   Ht 5\' 4"  (1.626 m)   Wt 203 lb (92.1 kg)   SpO2 96%   BMI 34.84 kg/m     Physical Exam Vitals reviewed.  Constitutional:      General: She is not in acute distress.    Appearance: Normal appearance. She is well-developed and normal weight. She is not diaphoretic.  HENT:     Head: Normocephalic and atraumatic.     Right Ear: Ear canal and external ear normal. There is impacted cerumen (partially).     Left Ear: Ear canal and external ear normal. There is impacted cerumen (partially).     Nose: Congestion and rhinorrhea present.     Mouth/Throat:     Pharynx: No posterior oropharyngeal erythema.     Comments: Postnasal drainage noted Eyes:     General: No scleral icterus.       Right eye: No discharge.        Left eye: No discharge.     Extraocular Movements: Extraocular movements intact.     Conjunctiva/sclera: Conjunctivae normal.     Pupils: Pupils are equal, round, and reactive to light.  Neck:     Thyroid: No thyromegaly.  Cardiovascular:     Rate and Rhythm: Normal rate and regular rhythm.     Pulses: Normal pulses.     Heart sounds: Normal heart sounds. No murmur heard. Pulmonary:     Effort: Pulmonary effort is normal. No respiratory distress.     Breath sounds: Normal breath sounds. No wheezing, rhonchi or rales.  Abdominal:     General: Abdomen is flat. Bowel sounds are normal.     Palpations: Abdomen is soft.  Musculoskeletal:     Cervical back: Neck supple.     Right lower leg: No edema.     Left lower leg: No edema.  Lymphadenopathy:     Cervical: No  cervical adenopathy.  Skin:    General: Skin is warm and dry.     Findings: No rash.  Neurological:     Mental Status: She is alert and oriented to person, place, and time. Mental status is at baseline.  Psychiatric:        Mood and Affect: Mood  normal.        Behavior: Behavior normal.      No results found for any visits on 07/29/23.      Assessment & Plan Chronic cough with nasal congestion Persistent cough with nasal congestion for four weeks, likely due to recent viral infection, allergies, and GERD. Partial relief with guaifenesin and fluticasone. Informed consent for penicillin, nasal spray, and albuterol inhaler. - Prescribe penicillin for potential bacterial infection. - Prescribe nasal spray for congestion. - Prescribe albuterol inhaler for wheezing and dyspnea. - Advise use of fluticasone one puff twice daily. - Follow up if no improvement by Friday or Monday.  Postoperative care following hip replacement Status post hip replacement surgery two weeks ago with expected postoperative discomfort. No complications. - Continue physical therapy as scheduled.  Hypertension Chronic, unstable Long-standing hypertension with recent atypical low blood pressure reading. Continues antihypertensive medication. - Prescribe losartan to manage blood pressure until next appointment. Will follow-up  Acid reflux Chronic GERD managed with pantoprazole. Potential contributor to chronic cough. Informed consent for continued use. - Continue pantoprazole for acid reflux management.  Hyperlipidemia Chronic and previously stable thank you okay so, Hyperlipidemia managed with rosuvastatin. Currently out of medication. - Prescribe rosuvastatin to manage cholesterol levels until next appointment.  Insomnia Insomnia managed with zolpidem. Currently has eight pills left. - Prescribe zolpidem to manage insomnia until next appointment.will reassess with her primary/per her pramary  discretion  Yeast infection prophylaxis Prophylactic treatment for potential yeast infection due to antibiotic use. History of yeast infections with antibiotics. Informed consent for antifungal medication and probiotics. - Advise use of probiotics and yogurt. - Prescribe antifungal medication to be taken every 72 hours during and after antibiotic treatment.  Follow-up Follow-up care needed to monitor response to treatment and manage ongoing health issues. - Schedule follow-up appointment with primary care provider on August 11, 2023. Pt was seen by previous primary in 11/2022 Med refill for her chronic conditions  No orders of the defined types were placed in this encounter.   No follow-ups on file.   The patient was advised to call back or seek an in-person evaluation if the symptoms worsen or if the condition fails to improve as anticipated.  I discussed the assessment and treatment plan with the patient. The patient was provided an opportunity to ask questions and all were answered. The patient agreed with the plan and demonstrated an understanding of the instructions.  I, Sharlett Lienemann, PA-C have reviewed all documentation for this visit. The documentation on 07/29/2023  for the exam, diagnosis, procedures, and orders are all accurate and complete.  Blane Bunting, [facility], MMS [facility] [phone] (phone) [phone] (fax)  [facility]

## 2023-08-09 ENCOUNTER — Inpatient Hospital Stay: Payer: Medicare PPO | Attending: Oncology

## 2023-08-09 DIAGNOSIS — D508 Other iron deficiency anemias: Secondary | ICD-10-CM | POA: Insufficient documentation

## 2023-08-09 DIAGNOSIS — C9110 Chronic lymphocytic leukemia of B-cell type not having achieved remission: Secondary | ICD-10-CM | POA: Diagnosis present

## 2023-08-09 LAB — CBC WITH DIFFERENTIAL/PLATELET
Abs Immature Granulocytes: 0.05 10*3/uL (ref 0.00–0.07)
Basophils Absolute: 0.1 10*3/uL (ref 0.0–0.1)
Basophils Relative: 1 %
Eosinophils Absolute: 0.5 10*3/uL (ref 0.0–0.5)
Eosinophils Relative: 4 %
HCT: 39.9 % (ref 36.0–46.0)
Hemoglobin: 12.7 g/dL (ref 12.0–15.0)
Immature Granulocytes: 0 %
Lymphocytes Relative: 64 %
Lymphs Abs: 9.5 10*3/uL — ABNORMAL HIGH (ref 0.7–4.0)
MCH: 29.3 pg (ref 26.0–34.0)
MCHC: 31.8 g/dL (ref 30.0–36.0)
MCV: 92.1 fL (ref 80.0–100.0)
Monocytes Absolute: 0.9 10*3/uL (ref 0.1–1.0)
Monocytes Relative: 6 %
Neutro Abs: 3.7 10*3/uL (ref 1.7–7.7)
Neutrophils Relative %: 25 %
Platelets: 324 10*3/uL (ref 150–400)
RBC: 4.33 MIL/uL (ref 3.87–5.11)
RDW: 13.6 % (ref 11.5–15.5)
WBC: 14.7 10*3/uL — ABNORMAL HIGH (ref 4.0–10.5)
nRBC: 0 % (ref 0.0–0.2)

## 2023-08-09 LAB — FERRITIN: Ferritin: 102 ng/mL (ref 11–307)

## 2023-08-09 LAB — IRON AND TIBC
Iron: 98 ug/dL (ref 28–170)
Saturation Ratios: 26 % (ref 10.4–31.8)
TIBC: 384 ug/dL (ref 250–450)
UIBC: 286 ug/dL

## 2023-08-10 ENCOUNTER — Ambulatory Visit: Payer: Medicare PPO

## 2023-08-10 DIAGNOSIS — Z0000 Encounter for general adult medical examination without abnormal findings: Secondary | ICD-10-CM

## 2023-08-10 DIAGNOSIS — E785 Hyperlipidemia, unspecified: Secondary | ICD-10-CM

## 2023-08-10 DIAGNOSIS — E1169 Type 2 diabetes mellitus with other specified complication: Secondary | ICD-10-CM | POA: Diagnosis not present

## 2023-08-10 DIAGNOSIS — Z0001 Encounter for general adult medical examination with abnormal findings: Secondary | ICD-10-CM | POA: Diagnosis not present

## 2023-08-10 NOTE — Progress Notes (Signed)
 Subjective:   Cathy Barnes is a 82 y.o. who presents for a Medicare Wellness preventive visit.  Visit Complete: Virtual I connected with  Cathy Barnes on 08/10/23 by a audio enabled telemedicine application and verified that I am speaking with the correct person using two identifiers.  Patient Location: Home  Provider Location: Office/Clinic  I discussed the limitations of evaluation and management by telemedicine. The patient expressed understanding and agreed to proceed.  Vital Signs: Because this visit was a virtual/telehealth visit, some criteria may be missing or patient reported. Any vitals not documented were not able to be obtained and vitals that have been documented are patient reported.  VideoDeclined- This patient declined Librarian, academic. Therefore the visit was completed with audio only.  Persons Participating in Visit: Patient.  AWV Questionnaire: No: Patient Medicare AWV questionnaire was not completed prior to this visit.  Cardiac Risk Factors include: advanced age (>37men, >31 women);dyslipidemia;hypertension;sedentary lifestyle;obesity (BMI >30kg/m2)     Objective:    Today's Vitals   08/10/23 1433  PainSc: 3    There is no height or weight on file to calculate BMI.     08/10/2023    2:40 PM 02/09/2023    1:23 PM 08/05/2022    2:52 PM 02/03/2022    1:39 PM 10/15/2021    1:00 PM 08/04/2021    1:39 PM 07/31/2021    3:37 PM  Advanced Directives  Does Patient Have a Medical Advance Directive? No Yes Yes Yes Yes Yes No  Type of Special educational needs teacher of [place];Living will  Healthcare Power of [place];Living will  Healthcare Power of [place];Living will   Does patient want to make changes to medical advance directive?     No - Patient declined No - Patient declined   Copy of Healthcare Power of Attorney in Chart?  No - copy requested       Would patient like information on creating a medical advance directive?  No - Patient declined      No - Patient declined    Current Medications (verified) Outpatient Encounter Medications as of 08/10/2023  Medication Sig   albuterol  (VENTOLIN  HFA) 108 (90 Base) MCG/ACT inhaler Inhale 2 puffs into the lungs every 6 (six) hours as needed for wheezing or shortness of breath.   ALPRAZolam  (XANAX ) 0.25 MG tablet Take 1 tablet (0.25 mg total) by mouth at bedtime.   carvedilol  (COREG ) 25 MG tablet Take 1 tablet (25 mg total) by mouth 2 (two) times daily with a meal.   Cholecalciferol (VITAMIN D3) 1.25 MG (50000 UT) CAPS Take 1 capsule (1.25 mg total) by mouth once a week.   cyclobenzaprine  (FLEXERIL ) 10 MG tablet Take 1 tablet (10 mg total) by mouth at bedtime as needed for muscle spasms.   donepezil  (ARICEPT ) 10 MG tablet TAKE ONE TABLET BY MOUTH AT BEDTIME   ezetimibe  (ZETIA ) 10 MG tablet Take 1 tablet (10 mg total) by mouth at bedtime.   gabapentin  (NEURONTIN ) 600 MG tablet Take 1 tablet (600 mg total) by mouth 2 (two) times daily.   HYDROcodone -acetaminophen  (NORCO/VICODIN) 5-325 MG tablet Take 1-2 tablets by mouth daily as needed for severe pain (pain score 7-10).   levothyroxine  (SYNTHROID ) 112 MCG tablet TAKE ONE TABLET BY MOUTH ONE TIME DAILY BEFORE BREAKFAST   lipase/protease/amylase (CREON ) 36000 UNITS CPEP capsule Take 2 capsules by mouth three times a day, take 1 capsule by mouth with a snack with max 3 snacks per day  losartan  (COZAAR ) 100 MG tablet Take 1 tablet (100 mg total) by mouth daily.   pantoprazole  (PROTONIX ) 40 MG tablet Take 1 tablet (40 mg total) by mouth every morning.   potassium chloride  SA (KLOR-CON  M) 20 MEQ tablet TAKE TWO TABLETS BY MOUTH WITH USE OF LASIX  20 MG TO PREVENT LOW POTASSIUM   rosuvastatin  (CRESTOR ) 10 MG tablet Take 1 tablet (10 mg total) by mouth daily.   zolpidem  (AMBIEN ) 10 MG tablet Take 1 tablet (10 mg total) by mouth at bedtime.   amoxicillin -clavulanate (AUGMENTIN ) 875-125 MG tablet Take 1 tablet by mouth 2 (two) times  daily. (Patient not taking: Reported on 08/10/2023)   fluconazole  (DIFLUCAN ) 150 MG tablet 150 mg orally for 2 doses; give the second dose 72 hours after the first dose (Patient not taking: Reported on 08/10/2023)   ipratropium (ATROVENT ) 0.03 % nasal spray Place 2 sprays into both nostrils every 12 (twelve) hours for 3 days.   No facility-administered encounter medications on file as of 08/10/2023.    Allergies (verified) Shellfish-derived products, Hydrochlorothiazide , Amlodipine , Lasix  [furosemide ], Shellfish allergy, and Latex   History: Past Medical History:  Diagnosis Date   (HFpEF) heart failure with preserved ejection fraction (HCC)    a. 08/2019 Echo: EF 55-60%, no rwma, Gr1 DD, nl RV size/fxn; b. 09/2022 Echo: EF 60-65%, no rwma, GrI Dd, nl RV fxn, mildly dil LA. no significant valvular disease.   Allergy    some   Cataract    bilaterally both eyes    Chest pain    a. 07/2019 MV: EF 77%, no ischemia/infarct.   CKD (chronic kidney disease), stage III (HCC)    CLL (chronic lymphocytic leukemia) (HCC)    stage 0   Gallstones    GERD (gastroesophageal reflux disease)    H/O blood clots    Hydrocephalus (HCC)    [facility]    Hyperglycemia    Hyperlipidemia    Hypertension    a. 03/2022 Renal Duplex: No RAS.   Lymphocytosis    monoclonal b cell   Migraine, unspecified, without mention of intractable migraine without mention of status migrainosus 12/14/2012   Osteoarthritis, chronic    Osteoporosis    Peptic ulcer    some bleeding with ulcers as well    Past Surgical History:  Procedure Laterality Date   ABDOMINAL HYSTERECTOMY     BREAST CYST ASPIRATION Left 1993   Removed   breast cyst removed Right    benign    BREAST LUMPECTOMY Bilateral    COLONOSCOPY     ERCP N/A 02/04/2018   Procedure: ENDOSCOPIC RETROGRADE CHOLANGIOPANCREATOGRAPHY (ERCP);  Surgeon: Marnee Sink, MD;  [facility] ENDOSCOPY;  Service: Endoscopy;  Laterality: N/A;   ERCP N/A 02/28/2018    Procedure: ENDOSCOPIC RETROGRADE CHOLANGIOPANCREATOGRAPHY (ERCP) STENT REMOVAL;  Surgeon: Marnee Sink, MD;  Location: [facility] ENDOSCOPY;  Service: Endoscopy;  Laterality: N/A;   EYE SURGERY     growth removal Left 2019   L wrist growth removed, abnormal cells   History of Cataract surgery Right 2011   left 2011   PARTIAL HYSTERECTOMY     POLYPECTOMY     ROBOTIC ASSISTED LAPAROSCOPIC CHOLECYSTECTOMY-MULTI SITE N/A 03/08/2018   Procedure: ROBOTIC ASSISTED LAPAROSCOPIC CHOLECYSTECTOMY-MULTI SITE;  Surgeon: Alben Alma, MD;  Location: [facility] ORS;  Service: General;  Laterality: N/A;   S/P ACL repair Left    knee   TONSILLECTOMY     TUBAL LIGATION  1975   Bilateral   Family History  Problem Relation Age  of Onset   Colon cancer Mother    Hypertension Mother    Colon cancer Father    Heart disease Father    Hypertension Father    Colon polyps Neg Hx    Rectal cancer Neg Hx    Stomach cancer Neg Hx    Esophageal cancer Neg Hx    Social History   Socioeconomic History   Marital status: Divorced    Spouse name: Not on file   Number of children: 1   Years of education: college   Highest education level: Some college, no degree  Occupational History   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 Divorced.      Social Drivers of Corporate investment banker Strain: Low Risk  (08/10/2023)   Overall Financial Resource Strain (CARDIA)    Difficulty of Paying Living Expenses: Not hard at all   Food Insecurity: No Food Insecurity (08/10/2023)   Hunger Vital Sign    Worried About Running Out of Food in the Last Year: Never true    Ran Out of Food in the Last Year: Never true  Transportation Needs: No Transportation Needs (08/10/2023)   PRAPARE - Administrator, Civil Service (Medical): No    Lack of Transportation (Non-Medical): No  Physical Activity: Insufficiently Active (08/10/2023)   Exercise Vital Sign    Days of Exercise per Week: 2 days    Minutes of Exercise per Session: 60 min  Stress: No Stress Concern Present (08/10/2023)   [employer] of Occupational Health - Occupational Stress Questionnaire    Feeling of Stress : Not at all  Social Connections: Socially Isolated (08/10/2023)   Social Connection and Isolation Panel [NHANES]    Frequency of Communication with Friends and Family: More than three times a week    Frequency of Social Gatherings with Friends and Family: Twice a week    Attends Religious Services: Never    Database administrator or Organizations: No    Attends Banker Meetings: Never    Marital Status: Widowed    Tobacco Counseling Counseling given: Not Answered    Clinical Intake:  Pre-visit preparation completed: Yes  Pain : 0-10 Pain Score: 3  Pain Type: Chronic pain Pain Location: Hip Pain Orientation: Left Pain Radiating Towards: JUST HAD SURGERY Pain Descriptors / Indicators: Aching, Discomfort Pain Onset: More than a month ago Pain Frequency: Constant Pain Relieving Factors: TYLENOL , GET OFF OF IT AND LAYING DOWN  Pain Relieving Factors: TYLENOL , GET OFF OF IT AND LAYING DOWN  BMI - recorded: 34.8 Nutritional Status: BMI > 30  Obese Nutritional Risks: None Diabetes: No  Lab Results  Component Value Date   HGBA1C 6.5 (H) 12/17/2022   HGBA1C 6.4 (H) 07/30/2022   HGBA1C 6.1 (H) 01/05/2022     How often do you need to have someone help you when you  read instructions, pamphlets, or other written  materials from your doctor or pharmacy?: 1 - Never  Interpreter Needed?: No  Information entered by :: Dellie Fergusson, LPN   Activities of Daily Living    08/10/2023    2:41 PM 09/24/2022   11:07 AM  In your present state of health, do you have any difficulty performing the following activities:  Hearing? 0 0  Vision? 0 0  Difficulty concentrating or making decisions? 0 0  Walking or climbing stairs? 0 1  Dressing or bathing? 0 0  Doing errands, shopping? 0 0  Preparing Food and eating ? N   Using the Toilet? N   In the past six months, have you accidently leaked urine? N   Do you have problems with loss of bowel control? N   Managing your Medications? N   Managing your Finances? N   Housekeeping or managing your Housekeeping? N     Patient Care Team: Pardue, Asencion Blacksmith, DO as PCP - General (Family Medicine) Wenona Hamilton, MD as PCP - Cardiology (Cardiology) Amedeo Jupiter, MD as Consulting Physician (Ophthalmology) Glory Larsen, MD as Consulting Physician (Neurology) Avonne Boettcher, MD as Consulting Physician (Oncology)  Indicate any recent Medical Services you may have received from other than [facility] providers in the past year (date may be approximate).     Assessment:   This is a routine wellness examination for [facility].  Hearing/Vision screen Hearing Screening - Comments:: NO AIDS Vision Screening - Comments:: READERS- HAD CATARACT SGY- DR.OMAN IN [place]   Goals Addressed             This Visit's Progress    Cut out extra servings         Depression Screen     08/10/2023    2:39 PM 07/29/2023   10:24 AM 12/17/2022    1:13 PM 09/24/2022   11:06 AM 09/17/2022    1:13 PM 08/05/2022    2:48 PM 07/30/2022   10:55 AM  PHQ 2/9 Scores  PHQ - 2 Score 0 1 1 1 1  0 1  PHQ- 9 Score 0 3 3 2 6  0 3    Fall Risk     08/10/2023    2:41 PM 12/17/2022    1:13 PM 09/24/2022   11:06 AM 09/17/2022    1:13 PM 08/05/2022    2:44 PM  Fall Risk   Falls in the past year? 0 0 0 0 0   Number falls in past yr: 0 0 0 0 0  Injury with Fall? 0 0 0 0 0  Risk for fall due to : No Fall Risks No Fall Risks   No Fall Risks  Follow up Falls prevention discussed;Falls evaluation completed Falls evaluation completed   Education provided;Falls prevention discussed    MEDICARE RISK AT HOME:  Medicare Risk at Home Any stairs in or around the home?: Yes If so, are there any without handrails?: No Home free of loose throw rugs in walkways, pet beds, electrical cords, etc?: Yes Adequate lighting in your home to reduce risk of falls?: Yes Life alert?: No Use of a cane, walker or w/c?: Yes (WALKER, THEN CANE NOW S/P HIP SGY) Grab bars in the bathroom?: Yes Shower chair or bench in shower?: Yes Elevated toilet seat or a handicapped toilet?: Yes  TIMED UP AND GO:  Was the test performed?  No  Cognitive Function: 6CIT completed    09/18/2020    8:37 AM 07/05/2017  1:37 PM 05/18/2016    1:52 PM 11/14/2015    4:02 PM  MMSE - Mini Mental State Exam  Orientation to time 5 5 5 5   Orientation to Place 4 5 5 5   Registration 3 3 3 3   Attention/ Calculation 5 5 5 5   Recall 3 3 3 3   Language- name 2 objects 2 2 2 2   Language- repeat 1 1 1 1   Language- follow 3 step command 3 3 3 3   Language- read & follow direction 1 1 1 1   Write a sentence 1 1 1 1   Copy design 1 1 1 1   Total score 29 30 30 30       02/07/2015    9:50 AM  Montreal Cognitive Assessment   Visuospatial/ Executive (0/5) 3  Naming (0/3) 2  Attention: Read list of digits (0/2) 2  Attention: Read list of letters (0/1) 1  Attention: Serial 7 subtraction starting at 100 (0/3) 1  Language: Repeat phrase (0/2) 0  Language : Fluency (0/1) 1  Abstraction (0/2) 2  Delayed Recall (0/5) 5  Orientation (0/6) 6  Total 23  Adjusted Score (based on education) 23      08/10/2023    2:44 PM 08/05/2022    2:56 PM 07/02/2016    1:50 PM  6CIT Screen  What Year? 0 points 0 points 0 points  What month? 0 points 0 points 0  points  What time? 0 points 0 points 0 points  Count back from 20 0 points 0 points 0 points  Months in reverse 0 points 0 points 0 points  Repeat phrase 0 points 0 points 0 points  Total Score 0 points 0 points 0 points    Immunizations Immunization History  Administered Date(s) Administered   Fluad Quad(high Dose 65+) 01/28/2021, 01/05/2022   Fluad Trivalent(High Dose 65+) 12/17/2022   Influenza Split 01/09/2010, 12/24/2011   Influenza, High Dose Seasonal PF 01/07/2014, 01/03/2018, 12/13/2018   Influenza-Unspecified 02/01/2017   PFIZER(Purple Top)SARS-COV-2 Vaccination 05/10/2019, 06/03/2019, 03/04/2020   Pneumococcal Conjugate-13 06/25/2014   Pneumococcal Polysaccharide-23 05/04/2011   Td 04/18/2004   Tdap 11/09/2012    Screening Tests Health Maintenance  Topic Date Due   Diabetic kidney evaluation - Urine ACR  Never done   Zoster Vaccines- Shingrix (1 of 2) Never done   DTaP/Tdap/Td (3 - Td or Tdap) 11/10/2022   COVID-19 Vaccine (4 - 2024-25 season) 12/20/2022   INFLUENZA VACCINE  11/19/2023   MAMMOGRAM  12/11/2023   Diabetic kidney evaluation - eGFR measurement  12/17/2023   Medicare Annual Wellness (AWV)  08/09/2024   Colonoscopy  09/10/2024   DEXA SCAN  12/11/2027   Pneumonia Vaccine 72+ Years old  Completed   HPV VACCINES  Aged Out   Meningococcal B Vaccine  Aged Out   Hepatitis C Screening  Discontinued    Health Maintenance  Health Maintenance Due  Topic Date Due   Diabetic kidney evaluation - Urine ACR  Never done   Zoster Vaccines- Shingrix (1 of 2) Never done   DTaP/Tdap/Td (3 - Td or Tdap) 11/10/2022   COVID-19 Vaccine (4 - 2024-25 season) 12/20/2022   Health Maintenance Items Addressed: HAS MAMMOGRAM SCHEDULED, COLONOSCOPY UP TO DATE, BONE DENSITY UP TO DATE; SHOTS UP TO DATE; DM LABS ORDERED  Additional Screening:  Vision Screening: Recommended annual ophthalmology exams for early detection of glaucoma and other disorders of the eye.  Dental  Screening: Recommended annual dental exams for proper oral hygiene  Community Resource Referral /  Chronic Care Management: CRR required this visit?  No   CCM required this visit?  No     Plan:     I have personally reviewed and noted the following in the patient's chart:   Medical and social history Use of alcohol, tobacco or illicit drugs  Current medications and supplements including opioid prescriptions. Patient is currently taking opioid prescriptions. Information provided to patient regarding non-opioid alternatives. Patient advised to discuss non-opioid treatment plan with their provider. Functional ability and status Nutritional status Physical activity Advanced directives List of other physicians Hospitalizations, surgeries, and ER visits in previous 12 months Vitals Screenings to include cognitive, depression, and falls Referrals and appointments  In addition, I have reviewed and discussed with patient certain preventive protocols, quality metrics, and best practice recommendations. A written personalized care plan for preventive services as well as general preventive health recommendations were provided to patient.     Pinky Bright, LPN   0/01/2724   After Visit Summary: (MyChart) Due to this being a telephonic visit, the after visit summary with patients personalized plan was offered to patient via MyChart   Notes:  DM LABS ORDERED

## 2023-08-10 NOTE — Patient Instructions (Addendum)
 Cathy Barnes , Thank you for taking time to come for your Medicare Wellness Visit. I appreciate your ongoing commitment to your health goals. Please review the following plan we discussed and let me know if I can assist you in the future.   Referrals/Orders/Follow-Ups/Clinician Recommendations: NONE  This is a list of the screening recommended for you and due dates:  Health Maintenance  Topic Date Due   Yearly kidney health urinalysis for diabetes  Never done   Zoster (Shingles) Vaccine (1 of 2) Never done   DTaP/Tdap/Td vaccine (3 - Td or Tdap) 11/10/2022   COVID-19 Vaccine (4 - 2024-25 season) 12/20/2022   Flu Shot  11/19/2023   Mammogram  12/11/2023   Yearly kidney function blood test for diabetes  12/17/2023   Medicare Annual Wellness Visit  08/09/2024   Colon Cancer Screening  09/10/2024   DEXA scan (bone density measurement)  12/11/2027   Pneumonia Vaccine  Completed   HPV Vaccine  Aged Out   Meningitis B Vaccine  Aged Out   Hepatitis C Screening  Discontinued    Advanced directives: (ACP Link)Information on Advanced Care Planning can be found at [place]  Secretary of [facility] Advance Health Care Directives Advance Health Care Directives. [email or web address]   Next Medicare Annual Wellness Visit scheduled for next year: Yes  08/15/24 @ 3:50 PM BY PHONE

## 2023-08-11 ENCOUNTER — Ambulatory Visit: Admitting: Family Medicine

## 2023-08-11 ENCOUNTER — Encounter: Payer: Self-pay | Admitting: Family Medicine

## 2023-08-11 VITALS — BP 130/45 | HR 70 | Resp 16 | Wt 201.5 lb

## 2023-08-11 DIAGNOSIS — J301 Allergic rhinitis due to pollen: Secondary | ICD-10-CM | POA: Diagnosis not present

## 2023-08-11 DIAGNOSIS — I10 Essential (primary) hypertension: Secondary | ICD-10-CM

## 2023-08-11 DIAGNOSIS — E1169 Type 2 diabetes mellitus with other specified complication: Secondary | ICD-10-CM | POA: Diagnosis not present

## 2023-08-11 DIAGNOSIS — F5104 Psychophysiologic insomnia: Secondary | ICD-10-CM

## 2023-08-11 DIAGNOSIS — G588 Other specified mononeuropathies: Secondary | ICD-10-CM | POA: Diagnosis not present

## 2023-08-11 DIAGNOSIS — G3184 Mild cognitive impairment, so stated: Secondary | ICD-10-CM

## 2023-08-11 DIAGNOSIS — R600 Localized edema: Secondary | ICD-10-CM | POA: Diagnosis not present

## 2023-08-11 DIAGNOSIS — E785 Hyperlipidemia, unspecified: Secondary | ICD-10-CM

## 2023-08-11 MED ORDER — CYCLOBENZAPRINE HCL 10 MG PO TABS: 10.0000 mg | ORAL_TABLET | Freq: Every evening | ORAL | 3 refills | Status: AC | PRN

## 2023-08-11 MED ORDER — DONEPEZIL HCL 10 MG PO TABS: 10.0000 mg | ORAL_TABLET | Freq: Every day | ORAL | 3 refills | Status: AC

## 2023-08-11 MED ORDER — FEXOFENADINE HCL 60 MG PO TABS: 60.0000 mg | ORAL_TABLET | Freq: Every day | ORAL | 3 refills | Status: DC

## 2023-08-11 MED ORDER — POTASSIUM CHLORIDE CRYS ER 20 MEQ PO TBCR: EXTENDED_RELEASE_TABLET | ORAL | 0 refills | Status: AC

## 2023-08-11 MED ORDER — LOSARTAN POTASSIUM 100 MG PO TABS: 100.0000 mg | ORAL_TABLET | Freq: Every day | ORAL | 3 refills | Status: AC

## 2023-08-11 MED ORDER — GABAPENTIN 600 MG PO TABS: 600.0000 mg | ORAL_TABLET | Freq: Two times a day (BID) | ORAL | 3 refills | Status: DC

## 2023-08-11 MED ORDER — ROSUVASTATIN CALCIUM 10 MG PO TABS: 10.0000 mg | ORAL_TABLET | Freq: Every day | ORAL | 3 refills | Status: AC

## 2023-08-11 MED ORDER — ZOLPIDEM TARTRATE 10 MG PO TABS: 5.0000 mg | ORAL_TABLET | Freq: Every day | ORAL | 0 refills | Status: DC

## 2023-08-11 NOTE — Progress Notes (Signed)
 Established patient visit   [name]   DOB: [birth year]   83 y.o. Female  MRN: [id number] Visit Date: 08/11/2023  Today's healthcare provider: Carlean Charter, DO   Chief Complaint  Patient presents with   Medical Management of Chronic Issues   Subjective    HPI Last annual exam: 12/17/2022   Cathy Barnes is an 83 year old female with chronic lymphocytic leukemia who presents with persistent runny nose and cough.  She has been experiencing a persistent runny nose and cough for approximately five weeks. The symptoms are intermittent, with some days being more severe, described as her nose being 'just been the faucet zone.' She was previously prescribed antibiotics but discontinued them due to diarrhea. She suspects her symptoms may be related to allergies, as she has a history of seasonal allergies but not year-round allergies.  She has tried over-the-counter allergy medications such as Benadryl and Mucinex without relief. She has previously used Claritin, Zyrtec, and Allegra , but it has been a long time since she last took them. She is not currently taking any allergy medication due to concerns about her blood pressure.  Her medical history includes chronic lymphocytic leukemia, for which she is not on any medication but is monitored every six months. She recently had blood work done, including a CBC and anemia panel. She also has a history of osteoporosis in her back and has undergone a left hip replacement on July 15, 2023. She is currently using a cane and attending physical therapy.  Her current medications include ibuprofen, vitamin D  once a week, potassium (one tablet twice a day), ezetimibe , Crestor , levothyroxine , gabapentin , cyclobenzaprine , losartan , Ambien , Xanax , albuterol  (used once a day since her last visit), Protonix , Creon , and Aricept . She reports difficulty sleeping and takes a combination of gabapentin , cyclobenzaprine , Ambien , and Xanax  at night.  She has tried reducing her Ambien  dose but was unable to sleep when she did so.      Medications: Outpatient Medications Prior to Visit  Medication Sig Note   albuterol  (VENTOLIN  HFA) 108 (90 Base) MCG/ACT inhaler Inhale 2 puffs into the lungs every 6 (six) hours as needed for wheezing or shortness of breath.    ALPRAZolam  (XANAX ) 0.25 MG tablet Take 1 tablet (0.25 mg total) by mouth at bedtime.    carvedilol  (COREG ) 25 MG tablet Take 1 tablet (25 mg total) by mouth 2 (two) times daily with a meal.    Cholecalciferol (VITAMIN D3) 1.25 MG (50000 UT) CAPS Take 1 capsule (1.25 mg total) by mouth once a week.    ezetimibe  (ZETIA ) 10 MG tablet Take 1 tablet (10 mg total) by mouth at bedtime.    HYDROcodone -acetaminophen  (NORCO/VICODIN) 5-325 MG tablet Take 1-2 tablets by mouth daily as needed for severe pain (pain score 7-10).    levothyroxine  (SYNTHROID ) 112 MCG tablet TAKE ONE TABLET BY MOUTH ONE TIME DAILY BEFORE BREAKFAST    lipase/protease/amylase (CREON ) 36000 UNITS CPEP capsule Take 2 capsules by mouth three times a day, take 1 capsule by mouth with a snack with max 3 snacks per day    pantoprazole  (PROTONIX ) 40 MG tablet Take 1 tablet (40 mg total) by mouth every morning.    [DISCONTINUED] cyclobenzaprine  (FLEXERIL ) 10 MG tablet Take 1 tablet (10 mg total) by mouth at bedtime as needed for muscle spasms.    [DISCONTINUED] donepezil  (ARICEPT ) 10 MG tablet TAKE ONE TABLET BY MOUTH AT BEDTIME    [DISCONTINUED] fluconazole  (DIFLUCAN ) 150 MG tablet  150 mg orally for 2 doses; give the second dose 72 hours after the first dose 08/11/2023: did not end up needing it   [DISCONTINUED] gabapentin  (NEURONTIN ) 600 MG tablet Take 1 tablet (600 mg total) by mouth 2 (two) times daily.    [DISCONTINUED] losartan  (COZAAR ) 100 MG tablet Take 1 tablet (100 mg total) by mouth daily.    [DISCONTINUED] potassium chloride  SA (KLOR-CON  M) 20 MEQ tablet TAKE TWO TABLETS BY MOUTH WITH USE OF LASIX  20 MG TO PREVENT LOW  POTASSIUM    [DISCONTINUED] rosuvastatin  (CRESTOR ) 10 MG tablet Take 1 tablet (10 mg total) by mouth daily.    [DISCONTINUED] zolpidem  (AMBIEN ) 10 MG tablet Take 1 tablet (10 mg total) by mouth at bedtime.    ipratropium (ATROVENT ) 0.03 % nasal spray Place 2 sprays into both nostrils every 12 (twelve) hours for 3 days.    [DISCONTINUED] amoxicillin -clavulanate (AUGMENTIN ) 875-125 MG tablet Take 1 tablet by mouth 2 (two) times daily. (Patient not taking: Reported on 08/10/2023) 08/11/2023: diarrhea   No facility-administered medications prior to visit.        Objective    BP (!) 130/45 (BP Location: Left Arm, Patient Position: Sitting, Cuff Size: Large)   Pulse 70   Resp 16   Wt 201 lb 8 oz (91.4 kg)   SpO2 97%   BMI 34.59 kg/m     Physical Exam Constitutional:      Appearance: Normal appearance.  HENT:     Head: Normocephalic and atraumatic.  Eyes:     General: No scleral icterus.    Extraocular Movements: Extraocular movements intact.     Conjunctiva/sclera: Conjunctivae normal.  Cardiovascular:     Rate and Rhythm: Normal rate and regular rhythm.     Pulses: Normal pulses.     Heart sounds: Normal heart sounds.  Pulmonary:     Effort: Pulmonary effort is normal. No respiratory distress.     Breath sounds: Normal breath sounds.  Musculoskeletal:     Right lower leg: No edema.     Left lower leg: No edema.  Skin:    General: Skin is warm and dry.  Neurological:     Mental Status: She is alert and oriented to person, place, and time. Mental status is at baseline.  Psychiatric:        Mood and Affect: Mood normal.        Behavior: Behavior normal.      No results found for any visits on 08/11/23.  Assessment & Plan    Seasonal allergic rhinitis due to pollen -     Fexofenadine  HCl; Take 1 tablet (60 mg total) by mouth at bedtime.  Dispense: 90 tablet; Refill: 3  Fluid retention in legs -     Potassium Chloride  Crys ER; TAKE TWO TABLETS BY MOUTH WITH USE OF LASIX   20 MG TO PREVENT LOW POTASSIUM  Dispense: 180 tablet; Refill: 0  Hyperlipidemia associated with type 2 diabetes mellitus (HCC) -     Rosuvastatin  Calcium ; Take 1 tablet (10 mg total) by mouth daily.  Dispense: 90 tablet; Refill: 3  Intercostal neuralgia -     Gabapentin ; Take 1 tablet (600 mg total) by mouth 2 (two) times daily.  Dispense: 180 tablet; Refill: 3 -     Cyclobenzaprine  HCl; Take 1 tablet (10 mg total) by mouth at bedtime as needed for muscle spasms.  Dispense: 90 tablet; Refill: 3  Primary hypertension -     Losartan  Potassium; Take 1 tablet (100 mg total)  by mouth daily.  Dispense: 90 tablet; Refill: 3  Psychophysiologic insomnia -     Zolpidem  Tartrate; Take 0.5-1 tablets (5-10 mg total) by mouth at bedtime.  Dispense: 90 tablet; Refill: 0  MCI (mild cognitive impairment) -     Donepezil  HCl; Take 1 tablet (10 mg total) by mouth at bedtime.  Dispense: 90 tablet; Refill: 3      Allergic rhinitis Symptoms likely due to seasonal allergies. Over-the-counter medications ineffective. Recommended conservative approach with low-dose fexofenadine . - Start low-dose fexofenadine , increase to twice daily if needed. - Use fluticasone nasal spray as needed.  Hypertension Managed with losartan  and carvedilol . Cautious about allergy medications due to blood pressure impact. - Continue losartan  and carvedilol  as prescribed. - Monitor blood pressure regularly.  Hyperlipidemia due to type 2 diabetes mellitus Managed with ezetimibe  and rosuvastatin . Recent prescription adjustments made. - Continue ezetimibe  and rosuvastatin  as prescribed. 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 Recovering from left hip replacement, using cane, and in physical therapy. - Continue gabapentin  and cyclobenzaprine  as prescribed. - Continue physical therapy.  Psychophysiologic  insomnia Managed with zolpidem , gabapentin , cyclobenzaprine , and alprazolam . Difficulty sleeping, advised to reduce zolpidem  dose as recovery progresses. - Continue current medications for insomnia. - Attempt to reduce zolpidem  dose to half tablet as recovery progresses.  Osteoporosis Present with back issues, on vitamin D  supplementation. - Continue vitamin D  supplementation as prescribed.  Chronic lymphocytic leukemia Monitored by Dr. Randy Buttery with regular blood work. No current medication required. - Continue regular follow-up with Dr. Randy Buttery every six months. - Perform blood work as directed by Dr. Randy Buttery.  General Health Maintenance Due for tetanus and shingles vaccines. Undecided on COVID booster. Regular blood work and urine test needed. - Get Tdap and Shingrix vaccines at the pharmacy. - Consider COVID booster. - Complete urine test as ordered.   Return in about 4 months (around 12/20/2023) for CPE.      I discussed the assessment and treatment plan with the patient  The patient was provided an opportunity to ask questions and all were answered. The patient agreed with the plan and demonstrated an understanding of the instructions.   The patient was advised to call back or seek an in-person evaluation if the symptoms worsen or if the condition fails to improve as anticipated.    Carlean Charter, DO  [facility] [phone] (phone) [phone] (fax)  [facility]

## 2023-08-11 NOTE — Patient Instructions (Signed)
 Recommended vaccines: Tdap and Shingrix

## 2023-08-12 LAB — RENAL FUNCTION PANEL
Albumin: 4.2 g/dL (ref 3.7–4.7)
BUN/Creatinine Ratio: 18 (ref 12–28)
BUN: 18 mg/dL (ref 8–27)
CO2: 24 mmol/L (ref 20–29)
Calcium: 9.6 mg/dL (ref 8.7–10.3)
Chloride: 102 mmol/L (ref 96–106)
Creatinine, Ser: 1 mg/dL (ref 0.57–1.00)
Glucose: 131 mg/dL — ABNORMAL HIGH (ref 70–99)
Phosphorus: 3.7 mg/dL (ref 3.0–4.3)
Potassium: 4.9 mmol/L (ref 3.5–5.2)
Sodium: 140 mmol/L (ref 134–144)
eGFR: 57 mL/min/{1.73_m2} — ABNORMAL LOW (ref >59)

## 2023-08-12 LAB — MICROALBUMIN / CREATININE URINE RATIO
Creatinine, Urine: 96.2 mg/dL
Microalb/Creat Ratio: 8 mg/g{creat} (ref 0–29)
Microalbumin, Urine: 8 ug/mL

## 2023-08-15 ENCOUNTER — Encounter: Payer: Self-pay | Admitting: Family Medicine

## 2023-09-15 ENCOUNTER — Telehealth: Payer: Self-pay | Admitting: Family Medicine

## 2023-09-15 DIAGNOSIS — G588 Other specified mononeuropathies: Secondary | ICD-10-CM

## 2023-09-15 MED ORDER — GABAPENTIN 600 MG PO TABS: 600.0000 mg | ORAL_TABLET | Freq: Two times a day (BID) | ORAL | 3 refills | Status: DC

## 2023-09-15 NOTE — Telephone Encounter (Signed)
 [facility] pharmacy is requesting refill gabapentin  (NEURONTIN ) 600 MG tablet   Please advise

## 2023-10-04 ENCOUNTER — Telehealth: Payer: Self-pay

## 2023-10-04 NOTE — Telephone Encounter (Signed)
 Patient was last seen by Dr. Sandrea Cruel [phone]. Received refill request for Creon . Called patient to get her scheduled for an appointment and then send refills to get to that appointment.  Asked her to call back to schedule an appointment with Dr. Yvone Herd or perhaps an APP in [place].

## 2023-10-05 MED ORDER — PANCRELIPASE (LIP-PROT-AMYL) 36000-114000 UNITS PO CPEP: ORAL_CAPSULE | ORAL | 1 refills | Status: DC

## 2023-10-05 NOTE — Telephone Encounter (Signed)
 Refills of Creon  sent to pharmacy until patient can get in to see Dr. Yvone Herd

## 2023-10-05 NOTE — Telephone Encounter (Signed)
 Patient called and scheduled an appointment to be seen by Dr. Yvone Herd on August the 5 th. Patient is wondering if she can still have her Creon  medication fill until she is able to be seen with Dr. Yvone Herd. Please advise.

## 2023-10-05 NOTE — Telephone Encounter (Signed)
 Called patient on cell. Left another message.  Called patient at home number and left a detailed message to call back and get scheduled with Dr. Yvone Herd or an APP in [place] or [place]).

## 2023-10-16 ENCOUNTER — Other Ambulatory Visit: Payer: Self-pay | Admitting: Physician Assistant

## 2023-10-16 DIAGNOSIS — J0120 Acute ethmoidal sinusitis, unspecified: Secondary | ICD-10-CM

## 2023-10-18 NOTE — Telephone Encounter (Signed)
 Requested Prescriptions  Pending Prescriptions Disp Refills   albuterol  (VENTOLIN  HFA) 108 (90 Base) MCG/ACT inhaler [Pharmacy Med Name: ALBUTEROL  SUL HFA 90 MCG INH] 8.5 g 2    Sig: INHALE TWO PUFFS BY MOUTH EVERY 6 HOURS AS NEEDED FOR WHEEZING OR SHORTNESS OF BREATH     Pulmonology:  Beta Agonists 2 Failed - 10/18/2023  3:07 PM      Failed - Last BP in normal range    BP Readings from Last 1 Encounters:  08/11/23 (!) 130/45         Passed - Last Heart Rate in normal range    Pulse Readings from Last 1 Encounters:  08/11/23 70         Passed - Valid encounter within last 12 months    Recent Outpatient Visits           2 months ago Seasonal allergic rhinitis due to pollen   [facility] [place], Lauraine SAILOR, DO   2 months ago Subacute ethmoidal sinusitis   [facility] [place], [place], PA-C       Future Appointments             In 1 month [place], Lauraine SAILOR, DO [place] [facility]   In 1 month Vivienne, Lonni Ingle, NP [facility]

## 2023-11-22 NOTE — Progress Notes (Signed)
 [facility] Return Visit   Referring Provider Donzella Lauraine SAILOR, DO [address] [place],  [place] [address]  Primary Care Provider Donzella Lauraine SAILOR, DO  Patient Profile: Cathy Barnes is a 83 y.o. female with a past medical history noteworthy for HTN, HLD, CLL, mild cognitive impairment, CKD 3, overactive bladder who returns to the [facility] for follow-up of the problem(s) noted below.  Problem List: Pancreatic insufficiency - working diagnosis. R/O false result d/t watery stool IDA, resolved GERD with LA Grade B esophagitis Hiatal hernia Personal history of sessile serrated and adenomatous colon polyps, family history of colon cancer (father age 47, mother age 37) Status postcholecystectomy   History of Present Illness   Ms. Toomey was last seen in the GI office by Dr. Aneita 11/11/12024   Current GI Meds  Creon  36,000 -2 capsules 3 times daily with meals, 1 capsule with snacks Imodium 2 tablets as needed for diarrhea Pantoprazole  40 mg orally daily  Interval History   Discussed the use of AI scribe software for clinical note transcription with the patient, who gave verbal consent to proceed.  History of Present Illness Ms. Pollman has been followed in the gastroenterology office for management of chronic diarrhea  Previous evaluation has included: - Stool studies 12/2022 negative for enteric pathogens, fecal elastase low at 194 - 11/2022: TSH normal, CBC and CMP without evidence of malabsorption - Last colonoscopy performed 08/2021 for indication of IDA -no random biopsies performed at that time as presumably diarrhea was not present  - Based upon previous finding of low fecal elastase, Leianna was started on Creon  for possible EPI - Taking Creon  36,000 units 2 capsules with meals and 1 capsule with snacks - Also using Imodium as needed - Was advised regarding low FODMAP diet  At today's visit she reports:  - Ongoing loose  bowel movements, primarily in the mornings - Stool consistency resembles 'melted ice cream' - Typically one or two episodes per morning - Partial improvement with Creon , taken as two capsules before meals and one with a snack - Imodium used as needed, especially before outings, with two tablets per dose for symptom control - No blood, mucus, or melena in stool - No associated reflux symptoms - Intermittent abdominal cramps - Cramping more pronounced on the left side  Post-cholecystectomy status - History of cholecystectomy nearly three years ago, followed by two additional corrective procedures - Onset of diarrhea approximately one year after initial surgery - Trial of colestipol  without symptomatic improvement -unclear if she had dose optimization  GERD - GERD currently well-controlled with pantoprazole  40 mg orally daily    Last colonoscopy: 08/2021 -5 polyps 6 to 10 mm (SSP, TA, HP), left-sided diverticula Last endoscopy: 08/2021 -LA grade B esophagitis, gastritis  Last Abd CT/CTE/MRE: None  GI Review of Symptoms Significant for loose stool and abdominal cramping. Otherwise negative.  General Review of Systems  Review of systems is significant for the pertinent positives and negatives as listed per the HPI.  Full ROS is otherwise negative.  Past Medical History   Past Medical History:  Diagnosis Date   (HFpEF) heart failure with preserved ejection fraction (HCC)    a. 08/2019 Echo: EF 55-60%, no rwma, Gr1 DD, nl RV size/fxn; b. 09/2022 Echo: EF 60-65%, no rwma, GrI Dd, nl RV fxn, mildly dil LA. no significant valvular disease.   Allergy    some   Cataract    bilaterally both eyes    Chest pain  a. 07/2019 MV: EF 77%, no ischemia/infarct.   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 03/2022 Renal Duplex: No RAS.   Lymphocytosis    monoclonal b cell   Migraine, unspecified, without mention of intractable migraine without mention of status migrainosus 12/14/2012   Osteoarthritis, chronic    Osteoporosis    Peptic ulcer    some bleeding with ulcers as well      Past Surgical History   Past Surgical History:  Procedure Laterality Date   ABDOMINAL HYSTERECTOMY     BREAST CYST ASPIRATION Left 1993   Removed   breast cyst removed Right    benign    BREAST LUMPECTOMY Bilateral    COLONOSCOPY     ERCP N/A 02/04/2018   Procedure: ENDOSCOPIC RETROGRADE CHOLANGIOPANCREATOGRAPHY (ERCP);  Surgeon: Jinny Carmine, MD;  Location: [facility] ENDOSCOPY;  Service: Endoscopy;  Laterality: N/A;   ERCP N/A 02/28/2018   Procedure: ENDOSCOPIC RETROGRADE CHOLANGIOPANCREATOGRAPHY (ERCP) STENT REMOVAL;  Surgeon: Jinny Carmine, MD;  Location: [facility] ENDOSCOPY;  Service: Endoscopy;  Laterality: N/A;   EYE SURGERY     growth removal Left 2019   L wrist growth removed, abnormal cells   History of Cataract surgery Right 2011   left 2011   PARTIAL HYSTERECTOMY     POLYPECTOMY     ROBOTIC ASSISTED LAPAROSCOPIC CHOLECYSTECTOMY-MULTI SITE N/A 03/08/2018   Procedure: ROBOTIC ASSISTED LAPAROSCOPIC CHOLECYSTECTOMY-MULTI SITE;  Surgeon: Jordis Laneta FALCON, MD;  Location: [facility] ORS;  Service: General;  Laterality: N/A;   S/P ACL repair Left    knee   TONSILLECTOMY     TUBAL LIGATION  1975   Bilateral     Allergies and Medications   Allergies  Allergen Reactions   Shellfish-Derived Products Anaphylaxis    Throat closes, rash   Hydrochlorothiazide  Diarrhea    Microzide  branding    Amlodipine      Lower ext edema   Lasix  [Furosemide ]     Profound itching   Shellfish Allergy     Throat closes, rash   Latex Rash    Tapes,adhesives   Current Meds  Medication Sig   ALPRAZolam  (XANAX ) 0.25 MG tablet Take 1 tablet (0.25 mg total) by mouth at  bedtime.   carvedilol  (COREG ) 25 MG tablet Take 1 tablet (25 mg total) by mouth 2 (two) times daily with a meal.   Cholecalciferol (VITAMIN D3) 1.25 MG (50000 UT) CAPS Take 1 capsule (1.25 mg total) by mouth once a week.   cyclobenzaprine  (FLEXERIL ) 10 MG tablet Take 1 tablet (10 mg total) by mouth at bedtime as needed for muscle spasms.   donepezil  (ARICEPT ) 10 MG tablet Take 1 tablet (10 mg total) by mouth at bedtime.   ezetimibe  (ZETIA ) 10 MG tablet Take 1 tablet (10 mg total) by mouth at bedtime.   fexofenadine  (ALLEGRA ) 60 MG tablet Take 1 tablet (60 mg total) by mouth at bedtime.   gabapentin  (NEURONTIN ) 600 MG tablet Take 1 tablet (600 mg total) by mouth 2 (two) times daily.   HYDROcodone -acetaminophen  (NORCO/VICODIN) 5-325 MG tablet Take 1-2 tablets by mouth daily as needed for severe pain (pain score 7-10).   levothyroxine  (SYNTHROID ) 112 MCG tablet TAKE ONE TABLET BY MOUTH  ONE TIME DAILY BEFORE BREAKFAST   losartan  (COZAAR ) 100 MG tablet Take 1 tablet (100 mg total) by mouth daily.   pantoprazole  (PROTONIX ) 40 MG tablet Take 1 tablet (40 mg total) by mouth every morning.   potassium chloride  SA (KLOR-CON  M) 20 MEQ tablet TAKE TWO TABLETS BY MOUTH WITH USE OF LASIX  20 MG TO PREVENT LOW POTASSIUM   rosuvastatin  (CRESTOR ) 10 MG tablet Take 1 tablet (10 mg total) by mouth daily.   zolpidem  (AMBIEN ) 10 MG tablet Take 0.5-1 tablets (5-10 mg total) by mouth at bedtime.     Family His   Family History  Problem Relation Age of Onset   Colon cancer Mother    Hypertension Mother    Colon cancer Father    Heart disease Father    Hypertension Father    Colon polyps Neg Hx    Rectal cancer Neg Hx    Stomach cancer Neg Hx    Esophageal cancer Neg Hx     Social History   Social History   Tobacco Use   Smoking status: Never   Smokeless tobacco: Never  Vaping Use   Vaping status: Never Used  Substance Use Topics   Alcohol use: Yes    Alcohol/week: 1.0 - 3.0 standard drink of  alcohol    Types: 1 - 3 Glasses of wine per week    Comment: ocassionally   Drug use: No   Glori reports that she has never smoked. She has never used smokeless tobacco. She reports current alcohol use of about 1.0 - 3.0 standard drink of alcohol per week. She reports that she does not use drugs.  Vital Signs and Physical Examination   Vitals:   11/23/23 1355  BP: 118/72  Pulse: 64   Body mass index is 34.33 kg/m. Weight: 200 lb (90.7 kg)  General: Well developed, well nourished, no acute distress Head: Normocephalic and atraumatic Eyes: Sclerae anicteric, EOMI Lungs: Clear throughout to auscultation Heart: Regular rate and rhythm; No murmurs, rubs or bruits Abdomen: Soft, non tender and non distended. No masses, hepatosplenomegaly or hernias noted. Normal Bowel sounds Rectal: Deferred Musculoskeletal: Symmetrical with no gross deformities     Review of Data   The following data was reviewed at the time of this encounter:   Laboratory Studies      Latest Ref Rng & Units 08/09/2023   12:47 PM 02/09/2023   12:51 PM 12/17/2022    1:35 PM  CBC  WBC 4.0 - 10.5 K/uL 14.7  16.3  15.2   Hemoglobin 12.0 - 15.0 g/dL 87.2  86.2  85.5   Hematocrit 36.0 - 46.0 % 39.9  41.0  43.3   Platelets 150 - 400 K/uL 324  203  203     No results found for: LIPASE    Latest Ref Rng & Units 08/11/2023    3:20 PM 12/17/2022    1:35 PM 09/17/2022    1:23 PM  CMP  Glucose 70 - 99 mg/dL 868  878  886   BUN 8 - 27 mg/dL 18  18  24    Creatinine 0.57 - 1.00 mg/dL 8.99  8.83  8.86   Sodium 134 - 144 mmol/L 140  143  139   Potassium 3.5 - 5.2 mmol/L 4.9  4.7  3.7   Chloride 96 - 106 mmol/L 102  105  100   CO2 20 - 29 mmol/L 24  23  22    Calcium  8.7 - 10.3 mg/dL 9.6  9.5  9.5   Total Protein 6.0 - 8.5 g/dL  6.6    Total Bilirubin 0.0 - 1.2 mg/dL  0.3    Alkaline Phos 44 - 121 IU/L  112    AST 0 - 40 IU/L  24    ALT 0 - 32 IU/L  24       Imaging Studies  MRI/MRCP 01/31/2018 1.  Cholelithiasis with biliary dilatation. Common bile duct measures up to 10 mm with 2 adjacent tiny distal common bile duct stones evident.  CTAP 01/13/2018 Cholelithiasis is noted. Intrahepatic and extrahepatic biliary dilatation is noted. Correlation with liver function tests as well as MRCP is recommended evaluate for possible common bile duct obstruction.  GI Procedures and Studies  EGD/Colonoscopy 08/2021 EGD - LA grade B esophagitis, gastritis Colonoscopy - 5 polyps 6 to 10 mm (SSP, TA, HP), left-sided diverticula  Path:  1. Surgical [P], colon, rectum, transverse, and cecum, polyp (5) SESSILE SERRATED POLYP WITHOUT CYTOLOGIC DYSPLASIA, TUBULAR ADENOMAS AND HYPERPLASTIC POLYP. NEGATIVE FOR HIGH-GRADE DYSPLASIA. 2. Surgical [P], duodenal bxs FRAGMENTS OF NORMAL DUODENAL MUCOSA. THERE ARE NO DIAGNOSTIC FEATURES OF CELIAC DISEASE. 3. Surgical [P], gastric bxs FRAGMENTS OF GASTRIC MUCOSA WITH NO SIGNIFICANT MICROSCOPIC ABNORMALITIES. H. PYLORI, INTESTINAL METAPLASIA, ATROPHY AND DYSPLASIA ARE NOT IDENTIFIED.  ERCP 02/28/2018 Visible stent was removed, biliary tree swept and was normal  ERCP 02/04/2018 - Hemostasis in the ampulla with bipolar cautery was performed.  - A filling defect consistent with a stone was seen on the cholangiogram.  - The examination was suspicious for choledocholithiasis. Complete removal was accomplished by biliary sphincterotomy and balloon extraction.  - A biliary sphincterotomy was performed.  - The biliary tree was swept.  - One plastic stent was placed into the common bile duct  Colonoscopy 10/2016 Multiple diverticula left colon-otherwise normal  Clinical Impression  It is my clinical impression that Ms. Molla is a 83 y.o. female with;  Pancreatic insufficiency - working diagnosis. R/O false result d/t watery stool IDA, resolved GERD with LA Grade B esophagitis Hiatal hernia Personal history of sessile serrated and adenomatous colon  polyps, family history of colon cancer (father age 18, mother age 45) Status postcholecystectomy  Everleigh presents to the office today primarily for follow-up of chronic diarrhea that evolved in 2024.  Testing in fall 2024 ruled out enteric pathogens but did show a low fecal elastase.  It was unclear if this represented true EPI versus a falsely low result due to watery diarrhea.  Last colonoscopy was performed in 2023 without random biopsies as she was not having diarrhea at that time.  She is postcholecystectomy but denies improvement in her symptoms with colestipol .  She has been managed with Creon  and has noted improvement but incomplete relief of her loose stools and diarrhea.  At today's visit we discussed ruling out additional causes of diarrhea such as bile acid malabsorption and celiac disease.  Will also update her pancreatic elastase testing to determine if there may be a need for further dose optimization of her Creon .  She is not endorsing constipation but obstipation with overflow could also contribute to looser stools and a KUB could be considered in the future.  At this juncture I feel microscopic colitis is less likely as I would not anticipate improvement with Creon  treatment and therefore we will continue to defer colonoscopic evaluation.  Ms. Mendia has a strong family history of colorectal cancer with both her mother and father being diagnosed with colorectal cancer later in life.  Her last colonoscopy  was performed in 2023 and disclosed 5 polyps which were sessile serrated polyps, tubular adenomas and hyperplastic polyps.  Reviewed risk/benefits of ongoing colonoscopic surveillance.  I would not necessarily recommend ongoing colonoscopic surveillance at her age, however, with her family history and overall general well health we can reevaluate if there is a need for this as we approach spring 2026.  Plan  Labs today: Celiac panel, 7AC4 testing Obtain updated stool test for fecal  elastase Continue Creon  36,000 and units 2 capsules with meals and 1 capsule with snacks May use Imodium as needed Continue pantoprazole  40 mg orally daily, lifestyle modification for GERD Reevaluate possibility of colonoscopic surveillance for polyps in spring 2026 depending upon patient's health at that time  Planned Follow Up 4-6 months  The patient or caregiver verbalized understanding of the material covered, with no barriers to understanding. All questions were answered. Patient or caregiver is agreeable with the plan outlined above.    It was a pleasure to see [facility].  If you have any questions or concerns regarding this evaluation, do not hesitate to contact me.  Inocente Hausen, MD [facility]   I spent total of 30 minutes in both face-to-face (20 minutes interview) and non-face-to-face (10 minutes chart review, care coordination, documentation)  activities, excluding procedures performed, for the visit on the date of this encounter.

## 2023-11-23 ENCOUNTER — Ambulatory Visit: Admitting: Pediatrics

## 2023-11-23 ENCOUNTER — Encounter: Payer: Self-pay | Admitting: Pediatrics

## 2023-11-23 ENCOUNTER — Other Ambulatory Visit (INDEPENDENT_AMBULATORY_CARE_PROVIDER_SITE_OTHER)

## 2023-11-23 VITALS — BP 118/72 | HR 64 | Ht 64.0 in | Wt 200.0 lb

## 2023-11-23 DIAGNOSIS — K8689 Other specified diseases of pancreas: Secondary | ICD-10-CM

## 2023-11-23 DIAGNOSIS — Z9049 Acquired absence of other specified parts of digestive tract: Secondary | ICD-10-CM

## 2023-11-23 DIAGNOSIS — Z800 Family history of malignant neoplasm of digestive organs: Secondary | ICD-10-CM | POA: Diagnosis not present

## 2023-11-23 DIAGNOSIS — K2100 Gastro-esophageal reflux disease with esophagitis, without bleeding: Secondary | ICD-10-CM

## 2023-11-23 DIAGNOSIS — Z860101 Personal history of adenomatous and serrated colon polyps: Secondary | ICD-10-CM | POA: Diagnosis not present

## 2023-11-23 DIAGNOSIS — K529 Noninfective gastroenteritis and colitis, unspecified: Secondary | ICD-10-CM

## 2023-11-23 DIAGNOSIS — K449 Diaphragmatic hernia without obstruction or gangrene: Secondary | ICD-10-CM

## 2023-11-23 DIAGNOSIS — Z860100 Personal history of colon polyps, unspecified: Secondary | ICD-10-CM

## 2023-11-23 DIAGNOSIS — R197 Diarrhea, unspecified: Secondary | ICD-10-CM

## 2023-11-23 NOTE — Patient Instructions (Signed)
 Your provider has requested that you go to the basement level for lab work before leaving today. Press B on the elevator. The lab is located at the first door on the left as you exit the elevator.  _______________________________________________________  If your blood pressure at your visit was 140/90 or greater, please contact your primary care physician to follow up on this.  _______________________________________________________  If you are age 82 or older, your body mass index should be between 23-30. Your Body mass index is 34.33 kg/m. If this is out of the aforementioned range listed, please consider follow up with your Primary Care Provider.  If you are age 4 or younger, your body mass index should be between 19-25. Your Body mass index is 34.33 kg/m. If this is out of the aformentioned range listed, please consider follow up with your Primary Care Provider.   ________________________________________________________  The [name] providers would like to encourage you to use MYCHART to communicate with providers for non-urgent requests or questions.  Due to long hold times on the telephone, sending your provider a message by [facility] may be a faster and more efficient way to get a response.  Please allow 48 business hours for a response.  Please remember that this is for non-urgent requests.  _______________________________________________________  [facility] is using a team-based approach to care.  Your team is made up of your doctor and two to three APPS. Our APPS (Nurse Practitioners and Physician Assistants) work with your physician to ensure care continuity for you. They are fully qualified to address your health concerns and develop a treatment plan. They communicate directly with your gastroenterologist to care for you. Seeing the Advanced Practice Practitioners on your physician's team can help you by facilitating care more promptly, often allowing for earlier  appointments, access to diagnostic testing, procedures, and other specialty referrals.

## 2023-11-24 ENCOUNTER — Ambulatory Visit: Payer: Self-pay | Admitting: Pediatrics

## 2023-11-24 LAB — TISSUE TRANSGLUTAMINASE, IGA: (tTG) Ab, IgA: <1 U/mL

## 2023-11-24 LAB — IGA: Immunoglobulin A: 254 mg/dL (ref 70–320)

## 2023-11-26 ENCOUNTER — Other Ambulatory Visit

## 2023-11-26 DIAGNOSIS — R197 Diarrhea, unspecified: Secondary | ICD-10-CM | POA: Diagnosis not present

## 2023-11-30 ENCOUNTER — Other Ambulatory Visit: Payer: Self-pay | Admitting: Family Medicine

## 2023-11-30 DIAGNOSIS — F5104 Psychophysiologic insomnia: Secondary | ICD-10-CM

## 2023-12-01 LAB — 7ALPHAC4: 7AlphaC4: 16 ng/mL

## 2023-12-02 ENCOUNTER — Other Ambulatory Visit: Payer: Self-pay | Admitting: Family Medicine

## 2023-12-02 DIAGNOSIS — F5104 Psychophysiologic insomnia: Secondary | ICD-10-CM

## 2023-12-03 LAB — PANCREATIC ELASTASE, FECAL: Pancreatic Elastase-1, Stool: 100 ug/g — ABNORMAL LOW (ref >200)

## 2023-12-06 ENCOUNTER — Other Ambulatory Visit: Payer: Self-pay

## 2023-12-06 MED ORDER — PANCRELIPASE (LIP-PROT-AMYL) 36000-114000 UNITS PO CPEP: ORAL_CAPSULE | ORAL | 1 refills | Status: DC

## 2023-12-06 NOTE — Telephone Encounter (Signed)
 Refill sent.

## 2023-12-06 NOTE — Progress Notes (Signed)
 See result note for change in dose order.

## 2023-12-09 LAB — FECAL FAT, QUALITATIVE
Fat Qual Neutral, Stl: NORMAL
Fat Qual Total, Stl: NORMAL

## 2023-12-10 ENCOUNTER — Ambulatory Visit: Admitting: Family Medicine

## 2023-12-10 ENCOUNTER — Encounter: Payer: Self-pay | Admitting: Family Medicine

## 2023-12-10 VITALS — BP 143/56 | HR 61 | Temp 97.9°F | Ht 65.0 in | Wt 200.0 lb

## 2023-12-10 DIAGNOSIS — E559 Vitamin D deficiency, unspecified: Secondary | ICD-10-CM | POA: Diagnosis not present

## 2023-12-10 DIAGNOSIS — Z79899 Other long term (current) drug therapy: Secondary | ICD-10-CM | POA: Diagnosis not present

## 2023-12-10 DIAGNOSIS — E7800 Pure hypercholesterolemia, unspecified: Secondary | ICD-10-CM

## 2023-12-10 DIAGNOSIS — K219 Gastro-esophageal reflux disease without esophagitis: Secondary | ICD-10-CM | POA: Diagnosis not present

## 2023-12-10 DIAGNOSIS — E034 Atrophy of thyroid (acquired): Secondary | ICD-10-CM

## 2023-12-10 DIAGNOSIS — I10 Essential (primary) hypertension: Secondary | ICD-10-CM

## 2023-12-10 DIAGNOSIS — N1831 Chronic kidney disease, stage 3a: Secondary | ICD-10-CM | POA: Diagnosis not present

## 2023-12-10 DIAGNOSIS — G588 Other specified mononeuropathies: Secondary | ICD-10-CM

## 2023-12-10 DIAGNOSIS — F5104 Psychophysiologic insomnia: Secondary | ICD-10-CM

## 2023-12-10 DIAGNOSIS — C9110 Chronic lymphocytic leukemia of B-cell type not having achieved remission: Secondary | ICD-10-CM | POA: Diagnosis not present

## 2023-12-10 DIAGNOSIS — M545 Low back pain, unspecified: Secondary | ICD-10-CM

## 2023-12-10 DIAGNOSIS — R7303 Prediabetes: Secondary | ICD-10-CM

## 2023-12-10 MED ORDER — CARVEDILOL 25 MG PO TABS: 25.0000 mg | ORAL_TABLET | Freq: Two times a day (BID) | ORAL | 1 refills | Status: AC

## 2023-12-10 MED ORDER — ALPRAZOLAM 0.25 MG PO TABS
0.2500 mg | ORAL_TABLET | Freq: Every day | ORAL | 5 refills | Status: AC
Start: 2023-12-30 — End: ?

## 2023-12-10 MED ORDER — HYDROCODONE-ACETAMINOPHEN 5-325 MG PO TABS
1.0000 | ORAL_TABLET | Freq: Every day | ORAL | 0 refills | Status: AC | PRN
Start: 2023-12-10 — End: ?

## 2023-12-10 MED ORDER — GABAPENTIN 600 MG PO TABS: 600.0000 mg | ORAL_TABLET | Freq: Two times a day (BID) | ORAL | 3 refills | Status: AC

## 2023-12-10 MED ORDER — ZOLPIDEM TARTRATE 10 MG PO TABS: 5.0000 mg | ORAL_TABLET | Freq: Every day | ORAL | 0 refills | Status: DC

## 2023-12-10 NOTE — Progress Notes (Signed)
 Established patient visit   [name]   DOB: 1940/10/05   83 y.o. Female  MRN: [id number] Visit Date: 12/10/2023  Today's healthcare provider: LAURAINE LOISE BUOY, DO   Chief Complaint  Patient presents with   Medical Management of Chronic Issues    Patient is here for a physical but it is too early, she had an appointment on 12/17/2022 for a physical.  Reports that she did have hip replacement at the end of March of this year and she had a problem seen a Manufacturing engineer.  States that the provider changed her medication.  Reports she is tolerating the medication fine.   Subjective    HPI Cathy Barnes is an 83 year old female with pancreatic insufficiency and hypertension who presents for medication management and follow-up.  She has ongoing issues with pancreatic insufficiency and is under the care of a gastroenterologist. Two weeks ago, her Creon  dosage was adjusted to three capsules before each meal and two capsules for each snack. Recent tests, including blood work and a stool sample, were conducted to monitor her condition.  She has a history of fluctuating blood pressure, typically ranging from the 130s to 160s systolic, which she monitors at home. She is currently on carvedilol  and losartan  for management. Her blood pressure issues began after pregnancy many years ago, and she is scheduled to see her cardiologist next Friday.  She experiences sleep difficulties, particularly with falling asleep, which she attributes partly to chronic right-sided pain. She has been on Ambien , recently reduced to half a tablet, but finds it ineffective, often falling asleep after midnight. She has tried other sleep aids in the past, including trazodone and Seroquel, without success. She also takes alprazolam  at bedtime.  Chronic right-sided hip pain has been present for years, with no definitive diagnosis despite x-rays and pain management interventions, including injections. The pain is  intermittent, sometimes triggered by movement, and she uses a heating pad for relief.  She is under the care of an oncologist for her chronic lymphocytic leukemia, with regular blood work, including an iron  panel and blood count, done prior to her visits. She is scheduled to see her oncologist next month.  She takes levothyroxine  for hypothyroidism but does not recall when her thyroid  levels were last checked. She takes Protonix  and is not currently taking vitamin B12 supplements.  She has a past slightly elevated A1c and is not currently on treatment for prediabetes. She tries to manage her sugar intake.       Medications: Outpatient Medications Prior to Visit  Medication Sig   Cholecalciferol (VITAMIN D3) 1.25 MG (50000 UT) CAPS Take 1 capsule (1.25 mg total) by mouth once a week.   cyclobenzaprine  (FLEXERIL ) 10 MG tablet Take 1 tablet (10 mg total) by mouth at bedtime as needed for muscle spasms.   donepezil  (ARICEPT ) 10 MG tablet Take 1 tablet (10 mg total) by mouth at bedtime.   ezetimibe  (ZETIA ) 10 MG tablet Take 1 tablet (10 mg total) by mouth at bedtime.   fexofenadine  (ALLEGRA ) 60 MG tablet Take 1 tablet (60 mg total) by mouth at bedtime.   levothyroxine  (SYNTHROID ) 112 MCG tablet TAKE ONE TABLET BY MOUTH ONE TIME DAILY BEFORE BREAKFAST   lipase/protease/amylase (CREON ) 36000 UNITS CPEP capsule Take 3 capsules by mouth three times a day, take 2 capsule by mouth with a snack with max 3 snacks per day.   losartan  (COZAAR ) 100 MG tablet Take 1 tablet (100 mg  total) by mouth daily.   pantoprazole  (PROTONIX ) 40 MG tablet Take 1 tablet (40 mg total) by mouth every morning.   potassium chloride  SA (KLOR-CON  M) 20 MEQ tablet TAKE TWO TABLETS BY MOUTH WITH USE OF LASIX  20 MG TO PREVENT LOW POTASSIUM   rosuvastatin  (CRESTOR ) 10 MG tablet Take 1 tablet (10 mg total) by mouth daily.   [DISCONTINUED] ALPRAZolam  (XANAX ) 0.25 MG tablet Take 1 tablet (0.25 mg total) by mouth at bedtime.    [DISCONTINUED] carvedilol  (COREG ) 25 MG tablet Take 1 tablet (25 mg total) by mouth 2 (two) times daily with a meal.   [DISCONTINUED] gabapentin  (NEURONTIN ) 600 MG tablet Take 1 tablet (600 mg total) by mouth 2 (two) times daily.   [DISCONTINUED] HYDROcodone -acetaminophen  (NORCO/VICODIN) 5-325 MG tablet Take 1-2 tablets by mouth daily as needed for severe pain (pain score 7-10).   [DISCONTINUED] zolpidem  (AMBIEN ) 10 MG tablet TAKE ONE-HALF TO ONE TABLET BY MOUTH AT BEDTIME   [DISCONTINUED] albuterol  (VENTOLIN  HFA) 108 (90 Base) MCG/ACT inhaler INHALE TWO PUFFS BY MOUTH EVERY 6 HOURS AS NEEDED FOR WHEEZING OR SHORTNESS OF BREATH (Patient not taking: Reported on 11/23/2023)   [DISCONTINUED] ipratropium (ATROVENT ) 0.03 % nasal spray Place 2 sprays into both nostrils every 12 (twelve) hours for 3 days.   No facility-administered medications prior to visit.        Objective    BP (!) 143/56 (BP Location: Left Arm, Patient Position: Sitting, Cuff Size: Normal)   Pulse 61   Temp 97.9 F (36.6 C) (Oral)   Ht 5' 5 (1.651 m)   Wt 200 lb (90.7 kg)   SpO2 96%   BMI 33.28 kg/m     Physical Exam Vitals and nursing note reviewed.  Constitutional:      General: She is not in acute distress.    Appearance: Normal appearance.  HENT:     Head: Normocephalic and atraumatic.  Eyes:     General: No scleral icterus.    Conjunctiva/sclera: Conjunctivae normal.  Cardiovascular:     Rate and Rhythm: Normal rate.  Pulmonary:     Effort: Pulmonary effort is normal.  Neurological:     Mental Status: She is alert and oriented to person, place, and time. Mental status is at baseline.  Psychiatric:        Mood and Affect: Mood normal.        Behavior: Behavior normal.      No results found for any visits on 12/10/23.  Assessment & Plan    Essential (primary) hypertension -     Carvedilol ; Take 1 tablet (25 mg total) by mouth 2 (two) times daily with a meal.  Dispense: 180 tablet; Refill:  1  Hypercholesterolemia  Avitaminosis D  CLL (chronic lymphocytic leukemia) (HCC)  Hypothyroidism due to acquired atrophy of thyroid   Stage 3a chronic kidney disease (HCC)  Prediabetes  Gastroesophageal reflux disease without esophagitis  High risk medication use  Psychophysiologic insomnia -     Zolpidem  Tartrate; Take 0.5-1 tablets (5-10 mg total) by mouth at bedtime.  Dispense: 90 tablet; Refill: 0 -     ALPRAZolam ; Take 1 tablet (0.25 mg total) by mouth at bedtime.  Dispense: 30 tablet; Refill: 5  Intercostal neuralgia -     Gabapentin ; Take 1 tablet (600 mg total) by mouth 2 (two) times daily.  Dispense: 180 tablet; Refill: 3  Chronic bilateral low back pain without sciatica -     HYDROcodone -Acetaminophen ; Take 1-2 tablets by mouth daily as needed for severe  pain (pain score 7-10).  Dispense: 60 tablet; Refill: 0      Essential hypertension Managed with carvedilol  and losartan . Home blood pressures range from 130s-140s, sometimes into the 160s. Cardiologist appointment scheduled next week.  Discussed risks and benefits of medication adjustments and greater control of her blood pressure with patient.  However, deferred medication adjustments to cardiologist due to low diastolic values, history of edema with amlodipine , and intolerance to other antihypertensives, as well as advanced age, which places her at greater risk for hypotension with more aggressive blood pressure management. - Continue carvedilol  and losartan . - Order CMP today. - Follow up with cardiologist next Friday for further management.  Defer to specialist management  Hypercholesterolemia Chronic, stable.  Will order repeat lipid panel today.  Avitaminosis D Chronic, stable.  Will order recheck today.  Prediabetes Chronic.  Most recent A1c in 2024 reached threshold for diabetes at 6.5.  Subsequent result of 6.5 or higher necessary to diagnose diabetes.  However patient declines repeat testing today due  to low utility, given her advanced age.  Gastroesophageal reflux disease Chronic, stable.  Chronic PPI use.  Will check vitamin B12 level due to high likelihood for interference of PPI with absorption of vitamin B12.  Exocrine pancreatic insufficiency Managed by gastroenterologist with recent Creon  dosage adjustment. - Continue current Creon  dosage as prescribed by gastroenterologist.  Defer to specialist management.  Chronic right-sided hip pain, etiology undetermined Persistent for years with no identified cause despite evaluations. Managed with hydrocodone  as needed. - Refill hydrocodone  prescription as needed for breakthrough pain.  Insomnia Chronic insomnia with difficulty falling asleep, exacerbated by pain. Managed with Ambien , recently reduced to half a tablet. Previous trials of other medications were ineffective. - Patient not tolerating half tablet dosing of Ambien .  Will continue Ambien  at half a tablet, alternating with a full tablet every other day until better able to tolerate half tablet. - Refill Ambien  prescription.  Hypothyroidism Managed with levothyroxine . Last thyroid  function test in 2024. No immediate need for repeat testing before annual physical. - Continue levothyroxine . - Order thyroid  function test to be drawn with oncology blood work next month (CPE also being rescheduled for next month, as it was too early to do it today).  Chronic lymphocytic leukemia, under active oncology surveillance Ongoing surveillance by oncology with regular blood work. Next oncology appointment scheduled for next month. - Continue oncology follow-up and blood work as scheduled.  Defer to specialist management.  Stage 3a chronic kidney disease Noted.  Chronic, stable.  Continue to optimize risk factors within reason given advanced age.  Continue to monitor.     Return in about 6 weeks (around 01/21/2024) for CPE.      I discussed the assessment and treatment plan with the  patient  The patient was provided an opportunity to ask questions and all were answered. The patient agreed with the plan and demonstrated an understanding of the instructions.   The patient was advised to call back or seek an in-person evaluation if the symptoms worsen or if the condition fails to improve as anticipated.    LAURAINE LOISE BUOY, DO  [facility] [phone] (phone) [phone] (fax)  [facility]

## 2023-12-11 DIAGNOSIS — K8689 Other specified diseases of pancreas: Secondary | ICD-10-CM | POA: Insufficient documentation

## 2023-12-11 DIAGNOSIS — M1612 Unilateral primary osteoarthritis, left hip: Secondary | ICD-10-CM | POA: Insufficient documentation

## 2023-12-15 NOTE — Progress Notes (Unsigned)
 Cardiology Office Note    Date:  12/16/2023   ID:  WM FRUCHTER, DOB 1940/04/22, MRN [id number]  PCP:  Donzella Lauraine SAILOR, DO  Cardiologist:  Deatrice Cage, MD  Electrophysiologist:  None   Chief Complaint: Follow up  History of Present Illness:   Cathy Barnes is a 83 y.o. female with history of hypertension, hyperlipidemia, borderline diabetes, hypothyroidism, CKD stage III, and chronic lymphocytic leukemia who presents for follow up on .    Patient was previously evaluated by cardiology almost 20 years ago in the setting of chest pain and reportedly underwent stress testing and echocardiogram which showed no significant abnormalities.  In 07/2019, she was seen by Dr. Cage in the setting of dyspnea occurring with minimal activity as well as occasional right-sided chest squeezing and discomfort with exertion.  EKG was unremarkable.  Stress testing was performed and showed no ischemia or infarct.  Echo showed normal LV function with G1 DD, and no significant valvular abnormalities.  In 01/2021, she was diagnosed with left popliteal DVT and was treated with 12 months of Eliquis .  In the setting of poorly controlled hypertension, she underwent renal artery duplex 03/2022 which was negative for renal artery stenosis.  In 2022, she required dose reduction and discontinuation of amlodipine  therapy in the setting of lower extremity edema.  She did not tolerate Lasix  secondary to itching.  Echo was performed and showed normal LV function with G1 DD and no significant valvular abnormalities.   Patient was most recently seen in cardiology clinic 05/2023 and overall doing well from a cardiac perspective.  Her blood pressure was mildly elevated, although she reported having a stressful day and normal BP readings at home. Carvedilol  was increased to 25 mg twice daily. She was evaluated for preoperative cardiac risk assessment with upcoming left hip arthroplasty.  No further ischemic evaluation was  necessary prior to surgery.   Patient presents today overall doing well from a cardiac perspective. She stays active doing things around the house and tending to her yard without issues. She denies chest pain, shortness of breath, lightheadedness, dizziness, palpitations, and lower extremity swelling. She takes her BP at home and reports readings similar to our value in office today.   Labs independently reviewed: 07/2023-Hgb 12.7, HCT 39.9, platelets 324 11/2022 creatinine 1.16, BUN 18, sodium 143, potassium 4.7, normal LFTs, normal TSH, A1C 6.5, TC 174, TG 232, HDL 41, LDL 94  Objective   Past Medical History:  Diagnosis Date   (HFpEF) heart failure with preserved ejection fraction (HCC)    a. 08/2019 Echo: EF 55-60%, no rwma, Gr1 DD, nl RV size/fxn; b. 09/2022 Echo: EF 60-65%, no rwma, GrI Dd, nl RV fxn, mildly dil LA. no significant valvular disease.   Allergy    some   Cataract    bilaterally both eyes    Chest pain    a. 07/2019 MV: EF 77%, no ischemia/infarct.   CKD (chronic kidney disease), stage III (HCC)    CLL (chronic lymphocytic leukemia) (HCC)    stage 0   Gallstones    GERD (gastroesophageal reflux disease)    H/O blood clots    Hydrocephalus (HCC)    [facility]    Hyperglycemia    Hyperlipidemia    Hypertension    a. 03/2022 Renal Duplex: No RAS.   Lymphocytosis    monoclonal b cell   Migraine, unspecified, without mention of intractable migraine without mention of status migrainosus 12/14/2012   Osteoarthritis, chronic  Osteoporosis    Peptic ulcer    some bleeding with ulcers as well    Thyroid  disease     Current Medications: Current Meds  Medication Sig   [START ON 12/30/2023] ALPRAZolam  (XANAX ) 0.25 MG tablet Take 1 tablet (0.25 mg total) by mouth at bedtime.   carvedilol  (COREG ) 25 MG tablet Take 1 tablet (25 mg total) by mouth 2 (two) times daily with a meal.   Cholecalciferol (VITAMIN D3) 1.25 MG (50000 UT) CAPS Take 1 capsule (1.25 mg total) by  mouth once a week.   cyclobenzaprine  (FLEXERIL ) 10 MG tablet Take 1 tablet (10 mg total) by mouth at bedtime as needed for muscle spasms.   docusate sodium (COLACE) 100 MG capsule Take 100 mg by mouth 2 (two) times daily.   donepezil  (ARICEPT ) 10 MG tablet Take 1 tablet (10 mg total) by mouth at bedtime.   ezetimibe  (ZETIA ) 10 MG tablet Take 1 tablet (10 mg total) by mouth at bedtime.   fexofenadine  (ALLEGRA ) 60 MG tablet Take 1 tablet (60 mg total) by mouth at bedtime.   gabapentin  (NEURONTIN ) 600 MG tablet Take 1 tablet (600 mg total) by mouth 2 (two) times daily.   HYDROcodone -acetaminophen  (NORCO/VICODIN) 5-325 MG tablet Take 1-2 tablets by mouth daily as needed for severe pain (pain score 7-10).   levothyroxine  (SYNTHROID ) 112 MCG tablet TAKE ONE TABLET BY MOUTH ONE TIME DAILY BEFORE BREAKFAST   lipase/protease/amylase (CREON ) 36000 UNITS CPEP capsule Take 3 capsules by mouth three times a day, take 2 capsule by mouth with a snack with max 3 snacks per day.   losartan  (COZAAR ) 100 MG tablet Take 1 tablet (100 mg total) by mouth daily.   pantoprazole  (PROTONIX ) 40 MG tablet Take 1 tablet (40 mg total) by mouth every morning.   potassium chloride  SA (KLOR-CON  M) 20 MEQ tablet TAKE TWO TABLETS BY MOUTH WITH USE OF LASIX  20 MG TO PREVENT LOW POTASSIUM   rosuvastatin  (CRESTOR ) 10 MG tablet Take 1 tablet (10 mg total) by mouth daily.   [START ON 12/31/2023] zolpidem  (AMBIEN ) 10 MG tablet Take 0.5-1 tablets (5-10 mg total) by mouth at bedtime.   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 Divorced.      Social Drivers of Health   Financial Resource Strain: Low Risk  (12/07/2023)   Overall Financial Resource Strain (CARDIA)  Difficulty of Paying Living Expenses: Not hard at all  Food Insecurity: No Food Insecurity (12/07/2023)   Hunger Vital Sign    Worried About Running Out of Food in the Last Year: Never true    Ran Out of Food in the Last Year: Never true  Transportation Needs: No Transportation Needs (12/07/2023)   PRAPARE - Administrator, Civil Service (Medical): No    Lack of Transportation (Non-Medical): No  Physical Activity: Inactive (12/07/2023)   Exercise Vital Sign    Days of Exercise per Week: 0 days    Minutes of Exercise per Session: Not on file  Stress: Stress Concern Present (12/07/2023)   [employer] of Occupational Health - Occupational Stress Questionnaire    Feeling of Stress: To some extent  Social Connections: Moderately Isolated (12/07/2023)   Social Connection and Isolation Panel    Frequency of Communication with Friends and Family: More than three times a week    Frequency of Social Gatherings with Friends and Family: Twice a week    Attends Religious Services: More than 4 times per year    Active Member of [employer] or Organizations: No    Attends Engineer, structural: Not on file    Marital Status: Divorced      Family History:  The patient's family history includes Arthritis in her father; Cancer in her father and mother; Colon cancer in her father and mother; Hearing loss in her father; Heart disease in her father; Hypertension in her father and mother. There is no history of Colon polyps, Rectal cancer, Stomach cancer, or Esophageal cancer.  ROS:   12-point review of systems is negative unless otherwise noted in the HPI.  EKGs/Other Studies Reviewed:    Studies reviewed were summarized above. The additional studies were reviewed today:  09/2022 Echo complete 1. Left ventricular ejection fraction, by estimation, is 60 to 65%. The  left ventricle has normal function. The left ventricle has no regional  wall motion abnormalities. Left ventricular diastolic parameters are  consistent with Grade I diastolic  dysfunction (impaired relaxation). The average left ventricular global  longitudinal strain is -19.6 %.   2. Right ventricular systolic function is normal. The right ventricular  size is normal. Tricuspid regurgitation signal is inadequate for assessing  PA pressure.   3. Left atrial size was mildly dilated.   4. The mitral valve is normal in structure. No evidence of mitral valve  regurgitation. No evidence of mitral stenosis.   5. The aortic valve is normal in structure. Aortic valve regurgitation is  not visualized. No aortic stenosis is present.   6. The inferior vena cava is normal in size with greater than 50%  respiratory variability, suggesting right atrial pressure of 3 mmHg.   07/2019 Lexiscan  myoview  There was no ST segment deviation noted during stress. There is no evidence for ischemia The study is normal. This is a low risk study. The left ventricular ejection fraction is hyperdynamic (EF=77%).  EKG:  EKG personally reviewed by me today EKG Interpretation Date/Time:  Thursday December 16 2023 11:15:51 EDT Ventricular Rate:  64 PR Interval:  150 QRS Duration:  78 QT  Interval:  424 QTC Calculation: 437 R Axis:   13  Text Interpretation: Normal sinus rhythm Normal ECG When compared with ECG of 16-Jun-2023 14:52, Minimal criteria for Anterior infarct are no longer Present No significant change was found Confirmed by Lorene Sinclair ([id number]) on 12/16/2023 11:22:49 AM  PHYSICAL EXAM:    VS:  BP 130/70 (BP Location: Left Arm, Patient Position: Sitting, Cuff Size: Normal)   Pulse 64   Ht 5' 5 (1.651 m)   Wt 202 lb 6 oz (91.8 kg)   SpO2 94%   BMI 33.68 kg/m   BMI: Body mass index is 33.68 kg/m.  GEN: Well nourished, well developed in no acute distress NECK: No JVD; No carotid bruits CARDIAC: RRR, no murmurs, rubs, gallops RESPIRATORY:  Clear to auscultation without rales, wheezing or rhonchi  ABDOMEN: Soft, non-tender, non-distended EXTREMITIES: No edema; No deformity  Wt Readings from Last 3 Encounters:  12/16/23 202 lb 6 oz (91.8 kg)  12/10/23 200 lb (90.7 kg)  11/23/23 200 lb (90.7 kg)      ASSESSMENT & PLAN:   Primary hypertension - BP reasonably well controlled in office and per at home readings. She is continued on losartan  100 mg daily and carvedilol  25 mg twice daily.   Hyperlipidemia - Most recent lipid panel 11/2022 with LDL 94. She is due for updated labs which have been ordered by her PCP. She is continued on rosuvastatin  10 mg daily and ezetimibe  10 mg daily.   Stage III CKD - Cr stable at 1.00 07/2023. She is continued on ARB therapy.    Disposition: F/u with Dr. Darron or an APP in 1 year.   Medication Adjustments/Labs and Tests Ordered: Current medicines are reviewed at length with the patient today.  Concerns regarding medicines are outlined above. Medication changes, Labs and Tests ordered today are summarized above and listed in the Patient Instructions accessible in Encounters.   Bonney Lesley Maffucci, PA-C 12/16/2023 12:19 PM     [facility] [place] [address] [place], [place]  [address] [phone]

## 2023-12-16 ENCOUNTER — Encounter: Payer: Self-pay | Admitting: Nurse Practitioner

## 2023-12-16 ENCOUNTER — Ambulatory Visit: Attending: Nurse Practitioner | Admitting: Physician Assistant

## 2023-12-16 VITALS — BP 130/70 | HR 64 | Ht 65.0 in | Wt 202.4 lb

## 2023-12-16 DIAGNOSIS — E785 Hyperlipidemia, unspecified: Secondary | ICD-10-CM

## 2023-12-16 DIAGNOSIS — I10 Essential (primary) hypertension: Secondary | ICD-10-CM

## 2023-12-16 DIAGNOSIS — N183 Chronic kidney disease, stage 3 unspecified: Secondary | ICD-10-CM

## 2023-12-16 NOTE — Patient Instructions (Signed)
 Medication Instructions:  Your physician recommends that you continue on your current medications as directed. Please refer to the Current Medication list given to you today.  *If you need a refill on your cardiac medications before your next appointment, please call your pharmacy*  Lab Work: None ordered   If you have labs (blood work) drawn today and your tests are completely normal, you will receive your results only by: MyChart Message (if you have MyChart) OR A paper copy in the mail If you have any lab test that is abnormal or we need to change your treatment, we will call you to review the results.  Testing/Procedures: None ordered    Follow-Up: At [facility], you and your health needs are our priority.  As part of our continuing mission to provide you with exceptional heart care, our providers are all part of one team.  This team includes your primary Cardiologist (physician) and Advanced Practice Providers or APPs (Physician Assistants and Nurse Practitioners) who all work together to provide you with the care you need, when you need it.  Your next appointment:   12 month(s)  Provider:   You may see Deatrice Cage, MD or one of the following Advanced Practice Providers on your designated Care Team:   Lonni Meager, NP Lesley Maffucci, PA-C Bernardino Bring, PA-C Cadence [place], PA-C Tylene Lunch, NP Barnie Hila, NP  We recommend signing up for the patient portal called MyChart.  Sign up information is provided on this After Visit Summary.  MyChart is used to connect with patients for Virtual Visits (Telemedicine).  Patients are able to view lab/test results, encounter notes, upcoming appointments, etc.  Non-urgent messages can be sent to your provider as well.    To learn more about what you can do with MyChart, go to [email or web address].

## 2023-12-19 ENCOUNTER — Other Ambulatory Visit: Payer: Self-pay | Admitting: Nurse Practitioner

## 2023-12-19 DIAGNOSIS — I10 Essential (primary) hypertension: Secondary | ICD-10-CM

## 2023-12-29 ENCOUNTER — Other Ambulatory Visit: Payer: Self-pay | Admitting: Family Medicine

## 2023-12-29 DIAGNOSIS — F5104 Psychophysiologic insomnia: Secondary | ICD-10-CM

## 2024-01-10 ENCOUNTER — Other Ambulatory Visit: Payer: Self-pay | Admitting: Family Medicine

## 2024-01-10 DIAGNOSIS — J0120 Acute ethmoidal sinusitis, unspecified: Secondary | ICD-10-CM

## 2024-01-22 ENCOUNTER — Other Ambulatory Visit: Payer: Self-pay | Admitting: Nurse Practitioner

## 2024-01-31 ENCOUNTER — Other Ambulatory Visit: Payer: Self-pay

## 2024-01-31 ENCOUNTER — Telehealth: Payer: Self-pay | Admitting: Family Medicine

## 2024-01-31 DIAGNOSIS — E034 Atrophy of thyroid (acquired): Secondary | ICD-10-CM

## 2024-01-31 MED ORDER — LEVOTHYROXINE SODIUM 112 MCG PO TABS: 112.0000 ug | ORAL_TABLET | Freq: Every day | ORAL | 0 refills | Status: AC

## 2024-01-31 NOTE — Telephone Encounter (Signed)
 Converted to refill req, lab ordered and Rx sent to pharmacy

## 2024-01-31 NOTE — Telephone Encounter (Signed)
 [facility] faxed refill request for the following medications:   levothyroxine  (SYNTHROID ) 112 MCG tablet    Please advise.

## 2024-02-04 ENCOUNTER — Telehealth: Payer: Self-pay | Admitting: Family Medicine

## 2024-02-04 DIAGNOSIS — E034 Atrophy of thyroid (acquired): Secondary | ICD-10-CM

## 2024-02-04 NOTE — Telephone Encounter (Signed)
 [facility] faxed refill request for the following medications:   levothyroxine  (SYNTHROID ) 112 MCG tablet    Please advise.

## 2024-02-04 NOTE — Telephone Encounter (Signed)
 Med refilled on 10/13 at preferred pharmacy

## 2024-02-09 ENCOUNTER — Ambulatory Visit: Payer: Self-pay | Admitting: Oncology

## 2024-02-09 ENCOUNTER — Encounter: Payer: Self-pay | Admitting: Oncology

## 2024-02-09 ENCOUNTER — Inpatient Hospital Stay: Payer: Medicare PPO

## 2024-02-09 ENCOUNTER — Inpatient Hospital Stay: Payer: Medicare PPO | Attending: Oncology | Admitting: Oncology

## 2024-02-09 VITALS — BP 142/57 | HR 67 | Temp 96.9°F | Resp 16 | Ht 65.0 in | Wt 201.5 lb

## 2024-02-09 DIAGNOSIS — E611 Iron deficiency: Secondary | ICD-10-CM | POA: Insufficient documentation

## 2024-02-09 DIAGNOSIS — C9110 Chronic lymphocytic leukemia of B-cell type not having achieved remission: Secondary | ICD-10-CM | POA: Insufficient documentation

## 2024-02-09 DIAGNOSIS — D508 Other iron deficiency anemias: Secondary | ICD-10-CM

## 2024-02-09 DIAGNOSIS — Z8639 Personal history of other endocrine, nutritional and metabolic disease: Secondary | ICD-10-CM

## 2024-02-09 LAB — CBC WITH DIFFERENTIAL/PLATELET
Abs Immature Granulocytes: 0.06 K/uL (ref 0.00–0.07)
Basophils Absolute: 0.1 K/uL (ref 0.0–0.1)
Basophils Relative: 0 %
Eosinophils Absolute: 0.4 K/uL (ref 0.0–0.5)
Eosinophils Relative: 2 %
HCT: 43 % (ref 36.0–46.0)
Hemoglobin: 14 g/dL (ref 12.0–15.0)
Immature Granulocytes: 0 %
Lymphocytes Relative: 65 %
Lymphs Abs: 11.6 K/uL — ABNORMAL HIGH (ref 0.7–4.0)
MCH: 28.8 pg (ref 26.0–34.0)
MCHC: 32.6 g/dL (ref 30.0–36.0)
MCV: 88.5 fL (ref 80.0–100.0)
Monocytes Absolute: 1.7 K/uL — ABNORMAL HIGH (ref 0.1–1.0)
Monocytes Relative: 9 %
Neutro Abs: 4.2 K/uL (ref 1.7–7.7)
Neutrophils Relative %: 24 %
Platelets: 238 K/uL (ref 150–400)
RBC: 4.86 MIL/uL (ref 3.87–5.11)
RDW: 13.9 % (ref 11.5–15.5)
Smear Review: NORMAL
WBC: 18 K/uL — ABNORMAL HIGH (ref 4.0–10.5)
nRBC: 0.1 % (ref 0.0–0.2)

## 2024-02-09 LAB — FERRITIN: Ferritin: 39 ng/mL (ref 11–307)

## 2024-02-09 LAB — IRON AND TIBC
Iron: 74 ug/dL (ref 28–170)
Saturation Ratios: 20 % (ref 10.4–31.8)
TIBC: 371 ug/dL (ref 250–450)
UIBC: 297 ug/dL

## 2024-02-09 NOTE — Addendum Note (Signed)
 Addended by: Iridessa Harrow on: 02/09/2024 01:42 PM   Modules accepted: Orders

## 2024-02-09 NOTE — Telephone Encounter (Signed)
-----   Message from Cathy Barnes sent at 02/09/2024  2:41 PM EDT ----- Ferritin is low than 50 and she is feeling fatigued.  We can do 3 doses of Venofer  for her if she is agreeable ----- Message ----- From: Interface, Lab In [place] Sent: 02/09/2024  12:58 PM EDT To: Cathy JAYSON Skene, MD

## 2024-02-09 NOTE — Progress Notes (Signed)
 Hip replacement 07/15/23, Dr. Leora.

## 2024-02-09 NOTE — Telephone Encounter (Signed)
 Per Dr. Melanee Ferritin is low than 50 and she is feeling fatigued. We can do 3 doses of Venofer  for her if she is agreeable.  Outbound call; patient is agreeable to IV iron .  Informed scheduling will be in touch shortly to coordinate IV iron .

## 2024-02-09 NOTE — Progress Notes (Signed)
 Hematology/Oncology Consult note [facility]  Telephone:([phone] [phone]  Patient Care Team: Donzella Lauraine SAILOR, DO as PCP - General (Family Medicine) Darron Deatrice LABOR, MD as PCP - Cardiology (Cardiology) Cleatus Collar, MD as Consulting Physician (Ophthalmology) Ines Onetha NOVAK, MD as Consulting Physician (Neurology) Melanee Annah BROCKS, MD as Consulting Physician (Oncology)   Name of the [name]  [id number]  [birth year]   Date of visit: 02/09/24  Diagnosis-Rai stage 0 CLL History of iron  deficiency  Chief complaint/ Reason for visit-routine follow-up of CLL and iron  deficiency  Heme/Onc history:  patient is a 83 year-old female with a past medical history significant for hypertension, vitamin D  deficiency and hypothyroidism. She has been sent to us  for evaluation of leukocytosis. Over the last 1 year patient's white count has been around 12 with predominantly lymphocytosis and monocytosis as well as eosinophilia. No evidence of anemia or thrombocytopenia.  Further blood work from 07-2016 was as follows: CBC showed white count of 12.9 with an absolute lymphocyte count of 6.6 and an eosinophilia of 1.1. H&H was 13.4/41.6 and a platelet count of 270. CMP was unremarkable. Review of peripheral smear showed mild leukocytosis with absolute lymphocytosis and eosinophilia. BCR abl testing was negative for CML. Flow cytometry showed CD5 positive, CD23 positive clonal B-cell population CLL/SLL phenotype CD38 negative. 36% of leukocytes are less than 5000/mcL. Eosinophilia   Patient found to have left lower extremity DVT in October 2022 when she presented with left calf pain and swelling.  Occlusive DVT noted in the left popliteal vein posterior tibial vein and peroneal vein.  She took Eliquis  for 6 months and then stopped it   Patient noted to be iron  deficient based on labs in June 2023 and received IV iron     Interval history- Discussed the  use of AI scribe software for clinical note transcription with the patient, who gave verbal consent to proceed.  Cathy Barnes is an 83 year old female with chronic lymphocytic leukemia who presents for her annual follow-up.  Her recent complete blood count (CBC) shows a hemoglobin level of 14 and a white blood cell count of 18. Her platelet count is normal. She feels 'pretty good' overall but experiences a lack of energy without a specific cause.  She has a history of iron  deficiency but has not required intravenous iron  recently. Her iron  levels are pending, and she has previously received IV iron  once. She underwent hip replacement surgery in March, which she reports went well.  ECOG PS- 1 Pain scale- 0   Review of systems- Review of Systems  Constitutional:  Negative for chills, fever, malaise/fatigue and weight loss.  HENT:  Negative for congestion, ear discharge and nosebleeds.   Eyes:  Negative for blurred vision.  Respiratory:  Negative for cough, hemoptysis, sputum production, shortness of breath and wheezing.   Cardiovascular:  Negative for chest pain, palpitations, orthopnea and claudication.  Gastrointestinal:  Negative for abdominal pain, blood in stool, constipation, diarrhea, heartburn, melena, nausea and vomiting.  Genitourinary:  Negative for dysuria, flank pain, frequency, hematuria and urgency.  Musculoskeletal:  Negative for back pain, joint pain and myalgias.  Skin:  Negative for rash.  Neurological:  Negative for dizziness, tingling, focal weakness, seizures, weakness and headaches.  Endo/Heme/Allergies:  Does not bruise/bleed easily.  Psychiatric/Behavioral:  Negative for depression and suicidal ideas. The patient does not have insomnia.       Allergies  Allergen Reactions   Shellfish Protein-Containing Drug Products  Anaphylaxis    Throat closes, rash   Hydrochlorothiazide  Diarrhea    Microzide  branding    Amlodipine      Lower ext edema   Lasix  [Furosemide ]      Profound itching   Shellfish Allergy     Throat closes, rash   Latex Rash    Tapes,adhesives     Past Medical History:  Diagnosis Date   (HFpEF) heart failure with preserved ejection fraction (HCC)    a. 08/2019 Echo: EF 55-60%, no rwma, Gr1 DD, nl RV size/fxn; b. 09/2022 Echo: EF 60-65%, no rwma, GrI Dd, nl RV fxn, mildly dil LA. no significant valvular disease.   Allergy    some   Cataract    bilaterally both eyes    Chest pain    a. 07/2019 MV: EF 77%, no ischemia/infarct.   CKD (chronic kidney disease), stage III (HCC)    CLL (chronic lymphocytic leukemia) (HCC)    stage 0   Gallstones    GERD (gastroesophageal reflux disease)    H/O blood clots    Hydrocephalus (HCC)    [facility]    Hyperglycemia    Hyperlipidemia    Hypertension    a. 03/2022 Renal Duplex: No RAS.   Lymphocytosis    monoclonal b cell   Migraine, unspecified, without mention of intractable migraine without mention of status migrainosus 12/14/2012   Osteoarthritis, chronic    Osteoporosis    Peptic ulcer    some bleeding with ulcers as well    Thyroid  disease      Past Surgical History:  Procedure Laterality Date   ABDOMINAL HYSTERECTOMY     BREAST CYST ASPIRATION Left 1993   Removed   breast cyst removed Right    benign    BREAST LUMPECTOMY Bilateral    CHOLECYSTECTOMY     COLONOSCOPY     ERCP N/A 02/04/2018   Procedure: ENDOSCOPIC RETROGRADE CHOLANGIOPANCREATOGRAPHY (ERCP);  Surgeon: Jinny Carmine, MD;  Location: [facility] ENDOSCOPY;  Service: Endoscopy;  Laterality: N/A;   ERCP N/A 02/28/2018   Procedure: ENDOSCOPIC RETROGRADE CHOLANGIOPANCREATOGRAPHY (ERCP) STENT REMOVAL;  Surgeon: Jinny Carmine, MD;  Location: [facility] ENDOSCOPY;  Service: Endoscopy;  Laterality: N/A;   EYE SURGERY     growth removal Left 2019   L wrist growth removed, abnormal cells   History of Cataract surgery Right 2011   left 2011   JOINT REPLACEMENT  March 2025   Left hip replacement   PARTIAL HYSTERECTOMY      POLYPECTOMY     ROBOTIC ASSISTED LAPAROSCOPIC CHOLECYSTECTOMY-MULTI SITE N/A 03/08/2018   Procedure: ROBOTIC ASSISTED LAPAROSCOPIC CHOLECYSTECTOMY-MULTI SITE;  Surgeon: Jordis Laneta FALCON, MD;  Location: [facility] ORS;  Service: General;  Laterality: N/A;   S/P ACL repair Left    knee   TONSILLECTOMY     TOTAL HIP ARTHROPLASTY Left 2025   TUBAL LIGATION  1975   Bilateral    Social History   Socioeconomic History   Marital status: Divorced    Spouse name: Not on file   Number of children: 1   Years of education: college   Highest education level: Associate degree: occupational, Scientist, product/process development, or vocational program  Occupational History   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 Divorced.      Social Drivers of Corporate investment banker Strain: Low Risk  (12/07/2023)   Overall Financial Resource Strain (CARDIA)    Difficulty of Paying Living Expenses: Not hard at all  Food Insecurity: No Food Insecurity (12/07/2023)   Hunger Vital Sign    Worried About Running Out of Food in the Last Year: Never true    Ran Out of Food in the Last Year: Never true  Transportation Needs: No Transportation Needs (12/07/2023)   PRAPARE - Administrator, Civil Service (Medical): No    Lack of Transportation (Non-Medical): No  Physical Activity: Inactive (12/07/2023)   Exercise Vital Sign    Days of Exercise per Week: 0 days    Minutes of Exercise per Session: Not on file  Stress: Stress Concern  Present (12/07/2023)   [employer] of Occupational Health - Occupational Stress Questionnaire    Feeling of Stress: To some extent  Social Connections: Moderately Isolated (12/07/2023)   Social Connection and Isolation Panel    Frequency of Communication with Friends and Family: More than three times a week    Frequency of Social Gatherings with Friends and Family: Twice a week    Attends Religious Services: More than 4 times per year    Active Member of [employer] or Organizations: No    Attends Engineer, structural: Not on file    Marital Status: Divorced  Intimate Partner Violence: Not At Risk (08/10/2023)   Humiliation, Afraid, Rape, and Kick questionnaire    Fear of Current or Ex-Partner: No    Emotionally Abused: No    Physically Abused: No    Sexually Abused: No    Family History  Problem Relation Age of Onset   Colon cancer Mother    Hypertension Mother    Cancer Mother    Colon cancer Father    Heart disease Father    Hypertension Father    Arthritis Father    Cancer Father    Hearing loss Father    Colon polyps Neg Hx    Rectal cancer Neg Hx    Stomach cancer Neg Hx    Esophageal cancer Neg Hx      Current Outpatient Medications:    albuterol  (VENTOLIN  HFA) 108 (90 Base) MCG/ACT inhaler, INHALE TWO PUFFS BY MOUTH EVERY 6 HOURS AS NEEDED FOR WHEEZING AND SHORTNESS OF BREATH, Disp: 8.5 each, Rfl: 2   ALPRAZolam  (XANAX ) 0.25 MG tablet, Take 1 tablet (0.25 mg total) by mouth at bedtime., Disp: 30 tablet, Rfl: 5   carvedilol  (COREG ) 25 MG tablet, Take 1 tablet (25 mg total) by mouth 2 (two) times daily with a meal., Disp: 180 tablet, Rfl: 1   Cholecalciferol (VITAMIN D3) 1.25 MG (50000 UT) CAPS, Take 1 capsule (1.25 mg total) by mouth once a week., Disp: 12.852 capsule, Rfl: 3   cyclobenzaprine  (FLEXERIL ) 10 MG tablet, Take 1 tablet (10 mg total) by mouth at bedtime as needed for muscle spasms., Disp: 90 tablet, Rfl: 3   donepezil  (ARICEPT ) 10 MG tablet, Take 1  tablet (10 mg total) by mouth at bedtime., Disp: 90 tablet, Rfl: 3  ezetimibe  (ZETIA ) 10 MG tablet, Take 1 tablet (10 mg total) by mouth at bedtime., Disp: 90 tablet, Rfl: 3   fexofenadine  (ALLEGRA ) 60 MG tablet, Take 1 tablet (60 mg total) by mouth at bedtime., Disp: 90 tablet, Rfl: 3   gabapentin  (NEURONTIN ) 600 MG tablet, Take 1 tablet (600 mg total) by mouth 2 (two) times daily., Disp: 180 tablet, Rfl: 3   HYDROcodone -acetaminophen  (NORCO/VICODIN) 5-325 MG tablet, Take 1-2 tablets by mouth daily as needed for severe pain (pain score 7-10)., Disp: 60 tablet, Rfl: 0   levothyroxine  (SYNTHROID ) 112 MCG tablet, Take 1 tablet (112 mcg total) by mouth daily before breakfast., Disp: 90 tablet, Rfl: 0   lipase/protease/amylase (CREON ) 36000 UNITS CPEP capsule, Take 3 capsules by mouth three times a day, take 2 capsule by mouth with a snack with max 3 snacks per day., Disp: 450 capsule, Rfl: 1   losartan  (COZAAR ) 100 MG tablet, Take 1 tablet (100 mg total) by mouth daily., Disp: 90 tablet, Rfl: 3   pantoprazole  (PROTONIX ) 40 MG tablet, Take 1 tablet (40 mg total) by mouth every morning., Disp: 90 tablet, Rfl: 3   potassium chloride  SA (KLOR-CON  M) 20 MEQ tablet, TAKE TWO TABLETS BY MOUTH WITH USE OF LASIX  20 MG TO PREVENT LOW POTASSIUM, Disp: 180 tablet, Rfl: 0   rosuvastatin  (CRESTOR ) 10 MG tablet, Take 1 tablet (10 mg total) by mouth daily., Disp: 90 tablet, Rfl: 3   zolpidem  (AMBIEN ) 10 MG tablet, Take 0.5-1 tablets (5-10 mg total) by mouth at bedtime., Disp: 90 tablet, Rfl: 0  Physical exam:  Vitals:   02/09/24 1302  BP: (!) 142/57  Pulse: 67  Resp: 16  Temp: (!) 96.9 F (36.1 C)  TempSrc: Tympanic  SpO2: 94%  Weight: 201 lb 8 oz (91.4 kg)  Height: 5' 5 (1.651 m)   Physical Exam Cardiovascular:     Rate and Rhythm: Normal rate and regular rhythm.     Heart sounds: Normal heart sounds.  Pulmonary:     Effort: Pulmonary effort is normal.     Breath sounds: Normal breath sounds.   Abdominal:     General: Bowel sounds are normal. There is no distension.     Palpations: Abdomen is soft.     Tenderness: There is no abdominal tenderness.  Lymphadenopathy:     Comments: No palpable cervical, supraclavicular, axillary or inguinal adenopathy    Skin:    General: Skin is warm and dry.  Neurological:     Mental Status: She is alert and oriented to person, place, and time.      I have personally reviewed labs listed below:    Latest Ref Rng & Units 08/11/2023    3:20 PM  CMP  Glucose 70 - 99 mg/dL 868   BUN 8 - 27 mg/dL 18   Creatinine 9.42 - 1.00 mg/dL 8.99   Sodium 865 - 855 mmol/L 140   Potassium 3.5 - 5.2 mmol/L 4.9   Chloride 96 - 106 mmol/L 102   CO2 20 - 29 mmol/L 24   Calcium  8.7 - 10.3 mg/dL 9.6       Latest Ref Rng & Units 02/09/2024   12:34 PM  CBC  WBC 4.0 - 10.5 K/uL 18.0   Hemoglobin 12.0 - 15.0 g/dL 85.9   Hematocrit 63.9 - 46.0 % 43.0   Platelets 150 - 400 K/uL 238      Assessment and plan- Patient is a 83 y.o. female here for a routine follow-up of Rai  stage 0 CLL and history of iron  deficiency  Assessment and Plan    Chronic lymphocytic leukemia (CLL) Rai stage 0 Chronic lymphocytic leukemia with fluctuating white blood cell count, currently at 18,000. Platelets normal. Hemoglobin stable at 14. General fatigue noted otherwise no B symptoms.  No palpable adenopathy or splenomegaly. - Check blood work in six months. - Schedule follow-up appointment in one year.  History of iron  deficiency Iron  deficiency with previous IV iron  requirement. Current hemoglobin normal at 14. Awaiting iron  studies for current status. - Review iron  studies when available. - Consider IV iron  if iron  levels are low.         Visit Diagnosis 1. CLL (chronic lymphocytic leukemia) (HCC)   2. History of iron  deficiency      Dr. Annah Skene, MD, MPH [facility] [phone] 02/09/2024 1:28 PM

## 2024-02-10 ENCOUNTER — Encounter: Payer: Self-pay | Admitting: Oncology

## 2024-02-10 NOTE — Progress Notes (Signed)
 [facility] Return Visit   Referring Provider Donzella Lauraine SAILOR, DO [address] [place],  [place] [address]  Primary Care Provider Donzella Lauraine SAILOR, DO  Patient Profile: Cathy Barnes is a 83 y.o. female with a past medical history noteworthy for HTN, HLD, CLL, mild cognitive impairment, CKD 3, overactive bladder who returns to the [facility] for follow-up of the problem(s) noted below.  Problem List: Pancreatic insufficiency - working diagnosis. R/O false result d/t watery stool IDA, resolved GERD with LA Grade B esophagitis Hiatal hernia Personal history of sessile serrated and adenomatous colon polyps, family history of colon cancer (father age 31, mother age 102) Status postcholecystectomy Fatigue and low ferritin   History of Present Illness   Cathy Barnes was last seen in the GI office 11/27/2023   Current GI Meds  Creon  36,000 -3 capsules 3 times daily with meals, 2 capsule with snacks Imodium 2 tablets as needed for diarrhea Pantoprazole  40 mg orally daily  Interval History   Discussed the use of AI scribe software for clinical note transcription with the patient, who gave verbal consent to proceed.  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 Aneita with: - Stool studies 12/2022 negative for enteric pathogens, fecal elastase low at 194 - 11/2022: TSH normal, CBC and CMP without evidence of malabsorption - Last colonoscopy performed 08/2021 for indication of IDA -no random biopsies performed at that time as presumably diarrhea was not present  - Based upon previous finding of low fecal elastase, Cathy Barnes was started on Creon  for possible EPI - Taking Creon  36,000 units 2 capsules with meals and 1 capsule with snacks - Also using Imodium as needed - Was advised  regarding low FODMAP diet  At last visit she reported: - Ongoing loose bowel movements, primarily in the mornings - Stool consistency resembles 'melted ice cream' - Typically one or two episodes per morning - Partial improvement with Creon , taken as two capsules before meals and one with a snack - Imodium used as needed, especially before outings, with two tablets per dose for symptom control - No blood, mucus, or melena in stool - No associated reflux symptoms - Intermittent abdominal cramps - Cramping more pronounced on the left side  - Labs 11/2023: Celiac panel negative, 7 alpha C4 normal; fecal fat normal, pancreatic elastase 100 - Creon  36,000 dose increased to 3 capsules with meals and 2 capsules with snacks  At today's visit reports: - Chronic diarrhea with improvement after Creon  dose adjustment - Morning bowel movement after breakfast is normal, followed by one or more loose, soft bowel movements within an hour - Stool consistency is very soft but not watery - Some urgency present, managed by staying near a bathroom - No recent accidents or incontinence - No bowel movements in the afternoon or evening - No pain, cramping, gas, or bloating during the day - No blood in stool  - Breakfast between 8:30 and 9:00 AM, lunch between 12:30 and 1:00 PM, dinner around 6:00 or 7:00 PM - Breakfast foods include cottage cheese, granola, fruit, and cereal - Avoids fatty, greasy foods  Post-cholecystectomy status - History of cholecystectomy nearly three years ago, followed by two additional corrective procedures - Onset of diarrhea approximately one year after initial surgery - Trial of colestipol  without symptomatic improvement -unclear if she had dose optimization -only dosed  at 1 g p.o. twice daily  GERD - GERD currently well-controlled with pantoprazole  40 mg orally daily  Fatigue and iron  deficiency - Decreased energy levels compared to the past - Oncologist recommended iron   infusions due to low ferritin levels -potentially related to CLL - Not anemic - Maintains daily activities despite decreased energy - Denies melena or hematochezia    Last colonoscopy: 08/2021 -5 polyps 6 to 10 mm (SSP, TA, HP), left-sided diverticula Last endoscopy: 08/2021 -LA grade B esophagitis, gastritis  Last Abd CT/CTE/MRE: None  GI Review of Symptoms Significant for loose stool and abdominal cramping. Otherwise negative.  General Review of Systems  Review of systems is significant for the pertinent positives and negatives as listed per the HPI.  Full ROS is otherwise negative.  Past Medical History   Past Medical History:  Diagnosis Date   (HFpEF) heart failure with preserved ejection fraction (HCC)    a. 08/2019 Echo: EF 55-60%, no rwma, Gr1 DD, nl RV size/fxn; b. 09/2022 Echo: EF 60-65%, no rwma, GrI Dd, nl RV fxn, mildly dil LA. no significant valvular disease.   Allergy    some   Cataract    bilaterally both eyes    Chest pain    a. 07/2019 MV: EF 77%, no ischemia/infarct.   CKD (chronic kidney disease), stage III (HCC)    CLL (chronic lymphocytic leukemia) (HCC)    stage 0   Gallstones    GERD (gastroesophageal reflux disease)    H/O blood clots    Hydrocephalus (HCC)    [facility]    Hyperglycemia    Hyperlipidemia    Hypertension    a. 03/2022 Renal Duplex: No RAS.   Lymphocytosis    monoclonal b cell   Migraine, unspecified, without mention of intractable migraine without mention of status migrainosus 12/14/2012   Osteoarthritis, chronic    Osteoporosis    Peptic ulcer    some bleeding with ulcers as well    Thyroid  disease      Past Surgical History   Past Surgical History:  Procedure Laterality Date   ABDOMINAL HYSTERECTOMY     BREAST CYST ASPIRATION Left 1993   Removed   breast cyst removed Right    benign    BREAST LUMPECTOMY Bilateral    CHOLECYSTECTOMY     COLONOSCOPY     ERCP N/A 02/04/2018   Procedure: ENDOSCOPIC RETROGRADE  CHOLANGIOPANCREATOGRAPHY (ERCP);  Surgeon: Jinny Carmine, MD;  Location: [facility] ENDOSCOPY;  Service: Endoscopy;  Laterality: N/A;   ERCP N/A 02/28/2018   Procedure: ENDOSCOPIC RETROGRADE CHOLANGIOPANCREATOGRAPHY (ERCP) STENT REMOVAL;  Surgeon: Jinny Carmine, MD;  Location: [facility] ENDOSCOPY;  Service: Endoscopy;  Laterality: N/A;   EYE SURGERY     growth removal Left 2019   L wrist growth removed, abnormal cells   History of Cataract surgery Right 2011   left 2011   JOINT REPLACEMENT  March 2025   Left hip replacement   PARTIAL HYSTERECTOMY     POLYPECTOMY     ROBOTIC ASSISTED LAPAROSCOPIC CHOLECYSTECTOMY-MULTI SITE N/A 03/08/2018   Procedure: ROBOTIC ASSISTED LAPAROSCOPIC CHOLECYSTECTOMY-MULTI SITE;  Surgeon: Jordis Laneta FALCON, MD;  Location: [facility] ORS;  Service: General;  Laterality: N/A;   S/P ACL repair Left    knee   TONSILLECTOMY     TOTAL HIP ARTHROPLASTY Left 2025   TUBAL LIGATION  1975   Bilateral     Allergies and Medications   Allergies  Allergen Reactions   Shellfish Protein-Containing Drug Products Anaphylaxis    Throat  closes, rash   Hydrochlorothiazide  Diarrhea    Microzide  branding    Amlodipine      Lower ext edema   Lasix  [Furosemide ]     Profound itching   Shellfish Allergy     Throat closes, rash   Latex Rash    Tapes,adhesives   Current Meds  Medication Sig   albuterol  (VENTOLIN  HFA) 108 (90 Base) MCG/ACT inhaler INHALE TWO PUFFS BY MOUTH EVERY 6 HOURS AS NEEDED FOR WHEEZING AND SHORTNESS OF BREATH   ALPRAZolam  (XANAX ) 0.25 MG tablet Take 1 tablet (0.25 mg total) by mouth at bedtime.   carvedilol  (COREG ) 25 MG tablet Take 1 tablet (25 mg total) by mouth 2 (two) times daily with a meal.   Cholecalciferol (VITAMIN D3) 1.25 MG (50000 UT) CAPS Take 1 capsule (1.25 mg total) by mouth once a week.   cyclobenzaprine  (FLEXERIL ) 10 MG tablet Take 1 tablet (10 mg total) by mouth at bedtime as needed for muscle spasms.   donepezil  (ARICEPT ) 10 MG tablet Take 1 tablet (10  mg total) by mouth at bedtime.   ezetimibe  (ZETIA ) 10 MG tablet Take 1 tablet (10 mg total) by mouth at bedtime.   fexofenadine  (ALLEGRA ) 60 MG tablet Take 1 tablet (60 mg total) by mouth at bedtime.   gabapentin  (NEURONTIN ) 600 MG tablet Take 1 tablet (600 mg total) by mouth 2 (two) times daily.   HYDROcodone -acetaminophen  (NORCO/VICODIN) 5-325 MG tablet Take 1-2 tablets by mouth daily as needed for severe pain (pain score 7-10).   levothyroxine  (SYNTHROID ) 112 MCG tablet Take 1 tablet (112 mcg total) by mouth daily before breakfast.   lipase/protease/amylase (CREON ) 36000 UNITS CPEP capsule Take 4 capsules (144,000 Units total) by mouth with breakfast, with lunch, and with evening meal. May also take 2 capsules (72,000 Units total) as needed (with snacks - up to 4 snacks daily).   losartan  (COZAAR ) 100 MG tablet Take 1 tablet (100 mg total) by mouth daily.   pantoprazole  (PROTONIX ) 40 MG tablet Take 1 tablet (40 mg total) by mouth every morning.   potassium chloride  SA (KLOR-CON  M) 20 MEQ tablet TAKE TWO TABLETS BY MOUTH WITH USE OF LASIX  20 MG TO PREVENT LOW POTASSIUM   rosuvastatin  (CRESTOR ) 10 MG tablet Take 1 tablet (10 mg total) by mouth daily.   zolpidem  (AMBIEN ) 10 MG tablet Take 0.5-1 tablets (5-10 mg total) by mouth at bedtime.   [DISCONTINUED] lipase/protease/amylase (CREON ) 36000 UNITS CPEP capsule Take 3 capsules by mouth three times a day, take 2 capsule by mouth with a snack with max 3 snacks per day.     Family His   Family History  Problem Relation Age of Onset   Colon cancer Mother    Hypertension Mother    Cancer Mother    Colon cancer Father    Heart disease Father    Hypertension Father    Arthritis Father    Cancer Father    Hearing loss Father    Colon polyps Neg Hx    Rectal cancer Neg Hx    Stomach cancer Neg Hx    Esophageal cancer Neg Hx     Social History   Social History   Tobacco Use   Smoking status: Never   Smokeless tobacco: Never  Vaping Use    Vaping status: Never Used  Substance Use Topics   Alcohol use: Yes    Alcohol/week: 1.0 - 3.0 standard drink of alcohol    Types: 1 - 3 Glasses of wine per week  Comment: ocassionally   Drug use: No   Shaylon reports that she has never smoked. She has never used smokeless tobacco. She reports current alcohol use of about 1.0 - 3.0 standard drink of alcohol per week. She reports that she does not use drugs.  Vital Signs and Physical Examination   Vitals:   02/11/24 1424  BP: 120/66  Pulse: 60    Body mass index is 33.28 kg/m. Weight: 200 lb (90.7 kg)  General: Well developed, well nourished, no acute distress Head: Normocephalic and atraumatic Eyes: Sclerae anicteric, EOMI Lungs: Clear throughout to auscultation Heart: Regular rate and rhythm; No murmurs, rubs or bruits Abdomen: Soft, non tender and non distended. No masses, hepatosplenomegaly or hernias noted. Normal Bowel sounds Rectal: Deferred Musculoskeletal: Symmetrical with no gross deformities     Review of Data   The following data was reviewed at the time of this encounter:   Laboratory Studies      Latest Ref Rng & Units 02/09/2024   12:34 PM 08/09/2023   12:47 PM 02/09/2023   12:51 PM  CBC  WBC 4.0 - 10.5 K/uL 18.0  14.7  16.3   Hemoglobin 12.0 - 15.0 g/dL 85.9  87.2  86.2   Hematocrit 36.0 - 46.0 % 43.0  39.9  41.0   Platelets 150 - 400 K/uL 238  324  203     No results found for: LIPASE    Latest Ref Rng & Units 08/11/2023    3:20 PM 12/17/2022    1:35 PM 09/17/2022    1:23 PM  CMP  Glucose 70 - 99 mg/dL 868  878  886   BUN 8 - 27 mg/dL 18  18  24    Creatinine 0.57 - 1.00 mg/dL 8.99  8.83  8.86   Sodium 134 - 144 mmol/L 140  143  139   Potassium 3.5 - 5.2 mmol/L 4.9  4.7  3.7   Chloride 96 - 106 mmol/L 102  105  100   CO2 20 - 29 mmol/L 24  23  22    Calcium  8.7 - 10.3 mg/dL 9.6  9.5  9.5   Total Protein 6.0 - 8.5 g/dL  6.6    Total Bilirubin 0.0 - 1.2 mg/dL  0.3    Alkaline Phos 44 -  121 IU/L  112    AST 0 - 40 IU/L  24    ALT 0 - 32 IU/L  24     Lab Results  Component Value Date   IRON  74 02/09/2024   TIBC 371 02/09/2024   FERRITIN 39 02/09/2024   Lab Results  Component Value Date   TSH 2.190 12/17/2022   T4TOTAL 8.4 07/01/2020   FREET4 1.84 (H) 07/30/2022   11/2023 Celiac panel negative 7 alpha C4 16  Pancreatic elastase 100 Fecal fat normal  12/2022 Pancreatic elastase 194   Imaging Studies  MRI/MRCP 01/31/2018 1. Cholelithiasis with biliary dilatation. Common bile duct measures up to 10 mm with 2 adjacent tiny distal common bile duct stones evident.  CTAP 01/13/2018 Cholelithiasis is noted. Intrahepatic and extrahepatic biliary dilatation is noted. Correlation with liver function tests as well as MRCP is recommended evaluate for possible common bile duct obstruction.  GI Procedures and Studies  EGD/Colonoscopy 08/2021 EGD - LA grade B esophagitis, gastritis Colonoscopy - 5 polyps 6 to 10 mm (SSP, TA, HP), left-sided diverticula  Path:  1. Surgical [P], colon, rectum, transverse, and cecum, polyp (5) SESSILE SERRATED POLYP WITHOUT CYTOLOGIC DYSPLASIA, TUBULAR ADENOMAS AND  HYPERPLASTIC POLYP. NEGATIVE FOR HIGH-GRADE DYSPLASIA. 2. Surgical [P], duodenal bxs FRAGMENTS OF NORMAL DUODENAL MUCOSA. THERE ARE NO DIAGNOSTIC FEATURES OF CELIAC DISEASE. 3. Surgical [P], gastric bxs FRAGMENTS OF GASTRIC MUCOSA WITH NO SIGNIFICANT MICROSCOPIC ABNORMALITIES. H. PYLORI, INTESTINAL METAPLASIA, ATROPHY AND DYSPLASIA ARE NOT IDENTIFIED.  ERCP 02/28/2018 Visible stent was removed, biliary tree swept and was normal  ERCP 02/04/2018 - Hemostasis in the ampulla with bipolar cautery was performed.  - A filling defect consistent with a stone was seen on the cholangiogram.  - The examination was suspicious for choledocholithiasis. Complete removal was accomplished by biliary sphincterotomy and balloon extraction.  - A biliary sphincterotomy was performed.  -  The biliary tree was swept.  - One plastic stent was placed into the common bile duct  Colonoscopy 10/2016 Multiple diverticula left colon-otherwise normal  Clinical Impression  It is my clinical impression that Cathy Barnes is a 83 y.o. female with;  Pancreatic insufficiency - working diagnosis. R/O false result d/t watery stool IDA, resolved GERD with LA Grade B esophagitis Hiatal hernia Personal history of sessile serrated and adenomatous colon polyps, family history of colon cancer (father age 39, mother age 31) Status postcholecystectomy Fatigue and low ferritin  Cathy Barnes presents to the office today primarily for follow-up of chronic diarrhea that evolved in 2024.  Testing in fall 2024 ruled out enteric pathogens but did show a low fecal elastase.  It was unclear if this represented true EPI versus a falsely low result due to watery diarrhea.  Last colonoscopy was performed in 2023 without random biopsies as she was not having diarrhea at that time.  She is postcholecystectomy but denies improvement in her symptoms with colestipol .  She has been managed with Creon  and has noted improvement but incomplete relief of her loose stools and diarrhea.  Additional labs obtained at the time of her last visit included normal celiac panel and 7 alpha C4.  Fecal elastase remains low at 100 with normal fecal fat.  I suggested further dose optimization of her Creon  to 3 capsules with meals and 2 with snacks.  Gladies returns to the office today reporting that adjusting her Creon  dose has yielded improvement.  Her first bowel movement of the day is normal but she continues to have some looser stool approximately 30 minutes later in the morning.  She does not have any bowel movements in the afternoon or evening.  She does use Imodium periodically in the morning if she needs to leave the house for appointments or errands.  Based upon her current age and weight, her Creon  can be further dose optimized and I have  recommended increasing to 4 capsules with her morning meal as that seems to be the time that she has the most difficulty with EPI.  The etiology of her EPI is unclear -previous ultrasound and CT imaging in 2019 were normal.  No associated weight loss.  Discussed that we could consider updated cross-sectional imaging in the future to ensure there are no pancreatic abnormalities.  Cathy Barnes has a strong family history of colorectal cancer with both her mother and father being diagnosed with colorectal cancer later in life.  Her last colonoscopy was performed in 2023 and disclosed 5 polyps which were sessile serrated polyps, tubular adenomas and hyperplastic polyps.  Reviewed risk/benefits of ongoing colonoscopic surveillance.  I would not necessarily recommend ongoing colonoscopic surveillance at her age, however, with her family history and overall general well health we can reevaluate if there is a need for  this as we approach spring 2026.  At today's visit, Cathy Barnes is endorsing symptoms of fatigue.  Her oncologist identified that her ferritin was in the low normal range.  She will proceed with iron  infusions.  If symptoms of fatigue do not improve with iron  infusions suggested checking vitamin B12 and updated thyroid  function.  Plan  Continue Creon  36,000 and adjust dose: 4 capsules with breakfast, 3 capsules with lunch and dinner, 2 capsules with snacks -requested that [facility] send an update through MyChart in 3 to 4 weeks regarding her response to dose adjustment of Creon  May use Imodium as needed -she is aware to use sparingly given potential side effects/cardiac effects Consider updated cross-sectional imaging to ensure no pancreatic abnormalities contributing to EPI. Continue pantoprazole  40 mg orally daily, lifestyle modification for GERD Reevaluate possibility of colonoscopic surveillance for polyps in spring 2026 depending upon patient's health at that time Proceed with iron  infusions as coordinated  by oncology-if fatigue not improving recommend checking vitamin B12 and updated thyroid  function.  Planned Follow Up 6 months  The patient or caregiver verbalized understanding of the material covered, with no barriers to understanding. All questions were answered. Patient or caregiver is agreeable with the plan outlined above.    It was a pleasure to see [facility].  If you have any questions or concerns regarding this evaluation, do not hesitate to contact me.  Inocente Hausen, MD [facility]   I spent total of 30 minutes in both face-to-face (20 minutes interview) and non-face-to-face (10 minutes chart review, care coordination, documentation)  activities, excluding procedures performed, for the visit on the date of this encounter.

## 2024-02-10 NOTE — Telephone Encounter (Signed)
 Iron infusions have been scheduled.

## 2024-02-11 ENCOUNTER — Ambulatory Visit: Admitting: Pediatrics

## 2024-02-11 ENCOUNTER — Other Ambulatory Visit: Payer: Self-pay | Admitting: Pediatrics

## 2024-02-11 ENCOUNTER — Encounter: Payer: Self-pay | Admitting: Pediatrics

## 2024-02-11 VITALS — BP 120/66 | HR 60 | Ht 65.0 in | Wt 200.0 lb

## 2024-02-11 DIAGNOSIS — R790 Abnormal level of blood mineral: Secondary | ICD-10-CM | POA: Diagnosis not present

## 2024-02-11 DIAGNOSIS — K8681 Exocrine pancreatic insufficiency: Secondary | ICD-10-CM | POA: Diagnosis not present

## 2024-02-11 DIAGNOSIS — R5383 Other fatigue: Secondary | ICD-10-CM | POA: Diagnosis not present

## 2024-02-11 DIAGNOSIS — Z860101 Personal history of adenomatous and serrated colon polyps: Secondary | ICD-10-CM

## 2024-02-11 MED ORDER — PANCRELIPASE (LIP-PROT-AMYL) 36000-114000 UNITS PO CPEP: ORAL_CAPSULE | ORAL | 3 refills | Status: AC

## 2024-02-11 NOTE — Patient Instructions (Signed)
 We have sent the following medications to your pharmacy for you to pick up at your convenience: Creon  36000, take 4 tablets with meal and take 2 tablets with snacks.  Follow up in 6 months.  Thank you for entrusting me with your care and for choosing [facility], Dr. Inocente Hausen  _______________________________________________________  If your blood pressure at your visit was 140/90 or greater, please contact your primary care physician to follow up on this.  _______________________________________________________  If you are age 60 or older, your body mass index should be between 23-30. Your Body mass index is 33.28 kg/m. If this is out of the aforementioned range listed, please consider follow up with your Primary Care Provider.  If you are age 16 or younger, your body mass index should be between 19-25. Your Body mass index is 33.28 kg/m. If this is out of the aformentioned range listed, please consider follow up with your Primary Care Provider.   ________________________________________________________  The [facility] providers would like to encourage you to use MYCHART to communicate with providers for non-urgent requests or questions.  Due to long hold times on the telephone, sending your provider a message by [facility] may be a faster and more efficient way to get a response.  Please allow 48 business hours for a response.  Please remember that this is for non-urgent requests.  _______________________________________________________  [facility] is using a team-based approach to care.  Your team is made up of your doctor and two to three APPS. Our APPS (Nurse Practitioners and Physician Assistants) work with your physician to ensure care continuity for you. They are fully qualified to address your health concerns and develop a treatment plan. They communicate directly with your gastroenterologist to care for you. Seeing the Advanced Practice Practitioners on your  physician's team can help you by facilitating care more promptly, often allowing for earlier appointments, access to diagnostic testing, procedures, and other specialty referrals.

## 2024-02-15 DIAGNOSIS — Q825 Congenital non-neoplastic nevus: Secondary | ICD-10-CM | POA: Diagnosis not present

## 2024-02-15 DIAGNOSIS — L821 Other seborrheic keratosis: Secondary | ICD-10-CM | POA: Diagnosis not present

## 2024-02-15 DIAGNOSIS — D225 Melanocytic nevi of trunk: Secondary | ICD-10-CM | POA: Diagnosis not present

## 2024-02-15 DIAGNOSIS — H61001 Unspecified perichondritis of right external ear: Secondary | ICD-10-CM | POA: Diagnosis not present

## 2024-02-15 DIAGNOSIS — I788 Other diseases of capillaries: Secondary | ICD-10-CM | POA: Diagnosis not present

## 2024-02-15 DIAGNOSIS — Z85828 Personal history of other malignant neoplasm of skin: Secondary | ICD-10-CM | POA: Diagnosis not present

## 2024-02-16 ENCOUNTER — Inpatient Hospital Stay

## 2024-02-16 VITALS — BP 177/53 | HR 57 | Temp 96.7°F | Resp 17

## 2024-02-16 DIAGNOSIS — C9110 Chronic lymphocytic leukemia of B-cell type not having achieved remission: Secondary | ICD-10-CM | POA: Diagnosis not present

## 2024-02-16 DIAGNOSIS — E611 Iron deficiency: Secondary | ICD-10-CM | POA: Diagnosis not present

## 2024-02-16 DIAGNOSIS — D508 Other iron deficiency anemias: Secondary | ICD-10-CM

## 2024-02-16 MED ORDER — SODIUM CHLORIDE 0.9% FLUSH
10.0000 mL | Freq: Once | INTRAVENOUS | Status: AC | PRN
  Administered 2024-02-16: 10 mL
  Filled 2024-02-16: qty 10

## 2024-02-16 MED ORDER — IRON SUCROSE 20 MG/ML IV SOLN
200.0000 mg | INTRAVENOUS | Status: DC
  Administered 2024-02-16: 200 mg via INTRAVENOUS

## 2024-02-16 NOTE — Patient Instructions (Signed)

## 2024-02-17 ENCOUNTER — Telehealth: Payer: Self-pay | Admitting: Pediatrics

## 2024-02-17 NOTE — Telephone Encounter (Signed)
 Inbound call from pharmacy stating they are needing additional information on medication creon  for patient. Pharmacy is stating it should contain maximum daily capsules on prescription  Please advise  Thank you

## 2024-02-18 NOTE — Telephone Encounter (Signed)
 I left a detailed message stating that I spoke to her pharmacy and Elvie advised that she has the Rx and everything is fine.  I told her to call us  back if she has any additional questions.

## 2024-02-18 NOTE — Telephone Encounter (Signed)
 I spoke to [place] at [facility] and she said that she has the Rx and there is nothing wrong with the script.

## 2024-02-23 ENCOUNTER — Inpatient Hospital Stay: Attending: Oncology

## 2024-02-23 VITALS — BP 134/56 | HR 67 | Temp 97.9°F | Resp 16

## 2024-02-23 DIAGNOSIS — D508 Other iron deficiency anemias: Secondary | ICD-10-CM

## 2024-02-23 DIAGNOSIS — C9110 Chronic lymphocytic leukemia of B-cell type not having achieved remission: Secondary | ICD-10-CM | POA: Insufficient documentation

## 2024-02-23 DIAGNOSIS — E611 Iron deficiency: Secondary | ICD-10-CM | POA: Diagnosis not present

## 2024-02-23 MED ORDER — IRON SUCROSE 20 MG/ML IV SOLN
200.0000 mg | INTRAVENOUS | Status: DC
  Administered 2024-02-23: 200 mg via INTRAVENOUS
  Filled 2024-02-23: qty 10

## 2024-02-23 NOTE — Patient Instructions (Signed)

## 2024-02-29 ENCOUNTER — Other Ambulatory Visit: Payer: Self-pay

## 2024-02-29 MED ORDER — PANTOPRAZOLE SODIUM 40 MG PO TBEC: 40.0000 mg | DELAYED_RELEASE_TABLET | Freq: Every morning | ORAL | 3 refills | Status: AC

## 2024-02-29 NOTE — Progress Notes (Unsigned)
 Refill request from [facility] received by fax.

## 2024-03-01 ENCOUNTER — Inpatient Hospital Stay

## 2024-03-01 VITALS — BP 136/53 | HR 70 | Temp 96.0°F | Resp 18

## 2024-03-01 DIAGNOSIS — C9110 Chronic lymphocytic leukemia of B-cell type not having achieved remission: Secondary | ICD-10-CM | POA: Diagnosis not present

## 2024-03-01 DIAGNOSIS — E611 Iron deficiency: Secondary | ICD-10-CM | POA: Diagnosis not present

## 2024-03-01 DIAGNOSIS — D508 Other iron deficiency anemias: Secondary | ICD-10-CM

## 2024-03-01 MED ORDER — IRON SUCROSE 20 MG/ML IV SOLN
200.0000 mg | INTRAVENOUS | Status: DC
  Administered 2024-03-01: 200 mg via INTRAVENOUS
  Filled 2024-03-01: qty 10

## 2024-03-03 DIAGNOSIS — M79671 Pain in right foot: Secondary | ICD-10-CM | POA: Diagnosis not present

## 2024-03-03 DIAGNOSIS — M7062 Trochanteric bursitis, left hip: Secondary | ICD-10-CM | POA: Diagnosis not present

## 2024-03-10 ENCOUNTER — Ambulatory Visit: Admitting: Family Medicine

## 2024-03-10 ENCOUNTER — Encounter: Payer: Self-pay | Admitting: Family Medicine

## 2024-03-10 VITALS — BP 129/71 | HR 61 | Temp 97.9°F | Ht 65.0 in | Wt 199.1 lb

## 2024-03-10 DIAGNOSIS — R7303 Prediabetes: Secondary | ICD-10-CM | POA: Diagnosis not present

## 2024-03-10 DIAGNOSIS — N1831 Chronic kidney disease, stage 3a: Secondary | ICD-10-CM | POA: Diagnosis not present

## 2024-03-10 DIAGNOSIS — E7800 Pure hypercholesterolemia, unspecified: Secondary | ICD-10-CM

## 2024-03-10 DIAGNOSIS — J4520 Mild intermittent asthma, uncomplicated: Secondary | ICD-10-CM

## 2024-03-10 DIAGNOSIS — M7072 Other bursitis of hip, left hip: Secondary | ICD-10-CM

## 2024-03-10 DIAGNOSIS — C9110 Chronic lymphocytic leukemia of B-cell type not having achieved remission: Secondary | ICD-10-CM | POA: Diagnosis not present

## 2024-03-10 DIAGNOSIS — K8689 Other specified diseases of pancreas: Secondary | ICD-10-CM

## 2024-03-10 DIAGNOSIS — Z0000 Encounter for general adult medical examination without abnormal findings: Secondary | ICD-10-CM | POA: Diagnosis not present

## 2024-03-10 DIAGNOSIS — J302 Other seasonal allergic rhinitis: Secondary | ICD-10-CM | POA: Diagnosis not present

## 2024-03-10 DIAGNOSIS — Z1231 Encounter for screening mammogram for malignant neoplasm of breast: Secondary | ICD-10-CM

## 2024-03-10 DIAGNOSIS — Z23 Encounter for immunization: Secondary | ICD-10-CM

## 2024-03-10 DIAGNOSIS — I10 Essential (primary) hypertension: Secondary | ICD-10-CM

## 2024-03-10 MED ORDER — CETIRIZINE HCL 10 MG PO TABS: 10.0000 mg | ORAL_TABLET | Freq: Every day | ORAL | 3 refills | Status: AC

## 2024-03-10 MED ORDER — FLUTICASONE PROPIONATE 50 MCG/ACT NA SUSP: 2.0000 | Freq: Every evening | NASAL | 6 refills | Status: AC

## 2024-03-10 NOTE — Progress Notes (Signed)
 Complete physical exam   [name]   DOB: 1940-09-18   83 y.o. Female  MRN: [id number] Visit Date: 03/10/2024  Today's healthcare provider: LAURAINE LOISE BUOY, DO   Chief Complaint  Patient presents with   Annual Exam    Diet- General but does watch her sugar intake Exercise- Had hip replacement went to surgeon last Friday and was told she had bursitis, not exercising due to that.  Got a cortisone shot. Overall feeling- Haven't felt too great Sleep- Having issues with her bladder and hip so not so well. Concerns- Wants to discuss the medication for inflammation and if anything could be prescribed to help with bladder issues.   Subjective    Cathy Barnes is a 83 y.o. female who presents today for a complete physical exam.   HPI HPI     Annual Exam    Additional comments: Diet- General but does watch her sugar intake Exercise- Had hip replacement went to surgeon last Friday and was told she had bursitis, not exercising due to that.  Got a cortisone shot. Overall feeling- Haven't felt too great Sleep- Having issues with her bladder and hip so not so well. Concerns- Wants to discuss the medication for inflammation and if anything could be prescribed to help with bladder issues.      Last edited by Terrel Powell CROME, CMA on 03/10/2024  1:23 PM.       Cathy Barnes is an 83 year old female who presents for an annual physical exam and flu shot.  She received a cortisone injection in her left hip last Friday due to bursitis, which was diagnosed after experiencing sudden pain not at the incision site. She reports that x-rays were performed and she was told she had bursitis, and the cortisone shot has significantly improved her symptoms. Initially, she was prescribed Celebrex , which caused bladder issues, leading to frequent urination. This was switched to Diclofenac, which has improved her bladder symptoms.  She has ongoing issues with allergies, experiencing a  runny nose and nighttime cough. She was previously on fexofenadine , which was ineffective, and is currently not taking any allergy medication due to concerns about blood pressure. She uses an albuterol  inhaler every other day, which helps, especially at night.  She mentions a history of leukemia and recently had an infusion a week ago, with three infusions in total.  She continues to follow with oncology.  She is on Creon  for pancreatic issues, with a current regimen of four capsules in the morning, three with meals, and two with snacks, which has been effective in managing her symptoms.  She denies any recent falls, balance issues, or pain in her calves, and reports no numbness or tingling. Her blood pressure is monitored at home and remains stable.    Past Medical History:  Diagnosis Date   (HFpEF) heart failure with preserved ejection fraction (HCC)    a. 08/2019 Echo: EF 55-60%, no rwma, Gr1 DD, nl RV size/fxn; b. 09/2022 Echo: EF 60-65%, no rwma, GrI Dd, nl RV fxn, mildly dil LA. no significant valvular disease.   Allergy    some   Cataract    bilaterally both eyes    Chest pain    a. 07/2019 MV: EF 77%, no ischemia/infarct.   CKD (chronic kidney disease), stage III (HCC)    CLL (chronic lymphocytic leukemia) (HCC)    stage 0   Gallstones    GERD (gastroesophageal reflux disease)  H/O blood clots    Hydrocephalus (HCC)    [facility]    Hyperglycemia    Hyperlipidemia    Hypertension    a. 03/2022 Renal Duplex: No RAS.   Lymphocytosis    monoclonal b cell   Migraine, unspecified, without mention of intractable migraine without mention of status migrainosus 12/14/2012   Osteoarthritis, chronic    Osteoporosis    Peptic ulcer    some bleeding with ulcers as well    Thyroid  disease    Past Surgical History:  Procedure Laterality Date   ABDOMINAL HYSTERECTOMY     BREAST CYST ASPIRATION Left 1993   Removed   breast cyst removed Right    benign    BREAST LUMPECTOMY  Bilateral    CHOLECYSTECTOMY     COLONOSCOPY     ERCP N/A 02/04/2018   Procedure: ENDOSCOPIC RETROGRADE CHOLANGIOPANCREATOGRAPHY (ERCP);  Surgeon: Jinny Carmine, MD;  Location: [facility] ENDOSCOPY;  Service: Endoscopy;  Laterality: N/A;   ERCP N/A 02/28/2018   Procedure: ENDOSCOPIC RETROGRADE CHOLANGIOPANCREATOGRAPHY (ERCP) STENT REMOVAL;  Surgeon: Jinny Carmine, MD;  Location: [facility] ENDOSCOPY;  Service: Endoscopy;  Laterality: N/A;   EYE SURGERY     growth removal Left 2019   L wrist growth removed, abnormal cells   History of Cataract surgery Right 2011   left 2011   JOINT REPLACEMENT  March 2025   Left hip replacement   PARTIAL HYSTERECTOMY     POLYPECTOMY     ROBOTIC ASSISTED LAPAROSCOPIC CHOLECYSTECTOMY-MULTI SITE N/A 03/08/2018   Procedure: ROBOTIC ASSISTED LAPAROSCOPIC CHOLECYSTECTOMY-MULTI SITE;  Surgeon: Jordis Laneta FALCON, MD;  Location: [facility] ORS;  Service: General;  Laterality: N/A;   S/P ACL repair Left    knee   TONSILLECTOMY     TOTAL HIP ARTHROPLASTY Left 2025   TUBAL LIGATION  1975   Bilateral   Social History   Socioeconomic History   Marital status: Divorced    Spouse name: Not on file   Number of children: 1   Years of education: college   Highest education level: Some college, no degree  Occupational History   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 Divorced.      Social Drivers of Corporate Investment Banker Strain: Low Risk  (03/09/2024)   Overall Financial  Resource Strain (CARDIA)    Difficulty of Paying Living Expenses: Not very hard  Food Insecurity: No Food Insecurity (03/09/2024)   Hunger Vital Sign    Worried About Running Out of Food in the Last Year: Never true    Ran Out of Food in the Last Year: Never true  Transportation Needs: No Transportation Needs (03/09/2024)   PRAPARE - Administrator, Civil Service (Medical): No    Lack of Transportation (Non-Medical): No  Physical Activity: Inactive (03/09/2024)   Exercise Vital Sign    Days of Exercise per Week: 0 days  Minutes of Exercise per Session: Not on file  Stress: Stress Concern Present (03/09/2024)   [employer] of Occupational Health - Occupational Stress Questionnaire    Feeling of Stress: To some extent  Social Connections: Moderately Isolated (03/09/2024)   Social Connection and Isolation Panel    Frequency of Communication with Friends and Family: More than three times a week    Frequency of Social Gatherings with Friends and Family: Twice a week    Attends Religious Services: 1 to 4 times per year    Active Member of [employer] or Organizations: No    Attends Banker Meetings: Not on file    Marital Status: Divorced  Intimate Partner Violence: Not At Risk (08/10/2023)   Humiliation, Afraid, Rape, and Kick questionnaire    Fear of Current or Ex-Partner: No    Emotionally Abused: No    Physically Abused: No    Sexually Abused: No   Family Status  Relation Name Status   Mother Vi Deceased at age 72       colon cancer   Father Meribeth Deceased at age 72       colon cancer   MGM  Deceased       Died from old age   PGM  Deceased at age 44       died from old age   PGF  Deceased       Brain Tumor   Neg Hx  (Not Specified)  No partnership data on file   Family History  Problem Relation Age of Onset   Colon cancer Mother    Hypertension Mother    Cancer Mother    Colon cancer Father    Heart disease Father    Hypertension Father     Arthritis Father    Cancer Father    Hearing loss Father    Colon polyps Neg Hx    Rectal cancer Neg Hx    Stomach cancer Neg Hx    Esophageal cancer Neg Hx    Allergies  Allergen Reactions   Shellfish Protein-Containing Drug Products Anaphylaxis    Throat closes, rash   Hydrochlorothiazide  Diarrhea    Microzide  branding    Amlodipine      Lower ext edema   Lasix  [Furosemide ]     Profound itching   Shellfish Allergy     Throat closes, rash   Latex Rash    Tapes,adhesives    Patient Care Team: Donzella Lauraine SAILOR, DO as PCP - General (Family Medicine) Darron Deatrice LABOR, MD as PCP - Cardiology (Cardiology) Cleatus Collar, MD as Consulting Physician (Ophthalmology) Ines Onetha NOVAK, MD as Consulting Physician (Neurology) Melanee Annah BROCKS, MD as Consulting Physician (Oncology)   Medications: Outpatient Medications Prior to Visit  Medication Sig Note   albuterol  (VENTOLIN  HFA) 108 (90 Base) MCG/ACT inhaler INHALE TWO PUFFS BY MOUTH EVERY 6 HOURS AS NEEDED FOR WHEEZING AND SHORTNESS OF BREATH    ALPRAZolam  (XANAX ) 0.25 MG tablet Take 1 tablet (0.25 mg total) by mouth at bedtime.    carvedilol  (COREG ) 25 MG tablet Take 1 tablet (25 mg total) by mouth 2 (two) times daily with a meal.    Cholecalciferol (VITAMIN D3) 1.25 MG (50000 UT) CAPS Take 1 capsule (1.25 mg total) by mouth once a week.    cyclobenzaprine  (FLEXERIL ) 10 MG tablet Take 1 tablet (10 mg total) by mouth at bedtime as needed for muscle spasms.    diclofenac (VOLTAREN) 75 MG EC tablet Take 75 mg by mouth  2 (two) times daily.    donepezil  (ARICEPT ) 10 MG tablet Take 1 tablet (10 mg total) by mouth at bedtime.    ezetimibe  (ZETIA ) 10 MG tablet Take 1 tablet (10 mg total) by mouth at bedtime.    fluticasone -salmeterol (WIXELA INHUB) 100-50 MCG/ACT AEPB Inhale 1 puff into the lungs 2 (two) times daily.    gabapentin  (NEURONTIN ) 600 MG tablet Take 1 tablet (600 mg total) by mouth 2 (two) times daily.    HYDROcodone -acetaminophen   (NORCO/VICODIN) 5-325 MG tablet Take 1-2 tablets by mouth daily as needed for severe pain (pain score 7-10).    levothyroxine  (SYNTHROID ) 112 MCG tablet Take 1 tablet (112 mcg total) by mouth daily before breakfast.    lipase/protease/amylase (CREON ) 36000 UNITS CPEP capsule Take 4 capsules (144,000 Units total) by mouth with breakfast, with lunch, and with evening meal. May also take 2 capsules (72,000 Units total) as needed (with snacks - up to 4 snacks daily).    losartan  (COZAAR ) 100 MG tablet Take 1 tablet (100 mg total) by mouth daily.    pantoprazole  (PROTONIX ) 40 MG tablet Take 1 tablet (40 mg total) by mouth every morning.    potassium chloride  SA (KLOR-CON  M) 20 MEQ tablet TAKE TWO TABLETS BY MOUTH WITH USE OF LASIX  20 MG TO PREVENT LOW POTASSIUM    rosuvastatin  (CRESTOR ) 10 MG tablet Take 1 tablet (10 mg total) by mouth daily.    zolpidem  (AMBIEN ) 10 MG tablet Take 0.5-1 tablets (5-10 mg total) by mouth at bedtime.    [DISCONTINUED] fexofenadine  (ALLEGRA ) 60 MG tablet Take 1 tablet (60 mg total) by mouth at bedtime. 03/10/2024: Does not help.   No facility-administered medications prior to visit.    Review of Systems  Constitutional:  Negative for chills, fatigue and fever.  HENT:  Positive for postnasal drip and rhinorrhea. Negative for congestion, ear pain, sneezing and sore throat.   Eyes: Negative.  Negative for pain and redness.  Respiratory:  Positive for cough (at night). Negative for shortness of breath and wheezing.   Cardiovascular:  Negative for chest pain and leg swelling.  Gastrointestinal:  Negative for abdominal pain, blood in stool, constipation, diarrhea and nausea.  Endocrine: Negative for polydipsia and polyphagia.  Genitourinary: Negative.  Negative for dysuria, flank pain, hematuria, pelvic pain, vaginal bleeding and vaginal discharge.  Musculoskeletal:  Negative for arthralgias, back pain, gait problem and joint swelling.  Skin:  Negative for rash.   Neurological: Negative.  Negative for dizziness, tremors, seizures, weakness, light-headedness, numbness and headaches.  Hematological:  Negative for adenopathy.  Psychiatric/Behavioral: Negative.  Negative for behavioral problems, confusion and dysphoric mood. The patient is not nervous/anxious and is not hyperactive.        Objective    BP 129/71 (BP Location: Left Arm, Cuff Size: Large)   Pulse 61   Temp 97.9 F (36.6 C) (Oral)   Ht 5' 5 (1.651 m)   Wt 199 lb 1.6 oz (90.3 kg)   SpO2 96%   BMI 33.13 kg/m    Physical Exam Vitals and nursing note reviewed.  Constitutional:      General: She is awake.     Appearance: Normal appearance.  HENT:     Head: Normocephalic and atraumatic.     Right Ear: Tympanic membrane, ear canal and external ear normal.     Left Ear: Tympanic membrane, ear canal and external ear normal.     Nose: Nose normal.     Mouth/Throat:     Mouth: Mucous  membranes are moist.     Pharynx: Oropharynx is clear. No oropharyngeal exudate or posterior oropharyngeal erythema.  Eyes:     General: No scleral icterus.    Extraocular Movements: Extraocular movements intact.     Conjunctiva/sclera: Conjunctivae normal.     Pupils: Pupils are equal, round, and reactive to light.  Neck:     Thyroid : No thyromegaly or thyroid  tenderness.  Cardiovascular:     Rate and Rhythm: Normal rate and regular rhythm.     Pulses: Normal pulses.     Heart sounds: Normal heart sounds.  Pulmonary:     Effort: Pulmonary effort is normal. No tachypnea, bradypnea or respiratory distress.     Breath sounds: Normal breath sounds. No stridor. No wheezing, rhonchi or rales.  Abdominal:     General: Bowel sounds are normal. There is no distension.     Palpations: Abdomen is soft. There is no mass.     Tenderness: There is no abdominal tenderness. There is no guarding.     Hernia: No hernia is present.  Musculoskeletal:     Cervical back: Normal range of motion and neck supple.      Right lower leg: No edema.     Left lower leg: No edema.  Lymphadenopathy:     Cervical: No cervical adenopathy.  Skin:    General: Skin is warm and dry.     Comments: Varicosities noted to feet bilaterally  Neurological:     Mental Status: She is alert and oriented to person, place, and time. Mental status is at baseline.  Psychiatric:        Mood and Affect: Mood normal.        Behavior: Behavior normal.      Last depression screening scores    03/10/2024    1:42 PM 03/01/2024    1:42 PM 02/23/2024    1:00 PM  PHQ 2/9 Scores  PHQ - 2 Score 1 0 0  PHQ- 9 Score 3     Last fall risk screening    03/10/2024    1:42 PM  Fall Risk   Falls in the past year? 0  Number falls in past yr: 0  Injury with Fall? 0  Risk for fall due to : No Fall Risks   Last Audit-C alcohol use screening    03/09/2024    4:01 PM  Alcohol Use Disorder Test (AUDIT)  1. How often do you have a drink containing alcohol? 2  2. How many drinks containing alcohol do you have on a typical day when you are drinking? 0  3. How often do you have six or more drinks on one occasion? 0  AUDIT-C Score 2      Patient-reported   A score of 3 or more in women, and 4 or more in men indicates increased risk for alcohol abuse, EXCEPT if all of the points are from question 1   No results found for any visits on 03/10/24.  Assessment & Plan    Routine Health Maintenance and Physical Exam  Exercise Activities and Dietary recommendations  Goals      Cut out extra servings     DIET - EAT MORE FRUITS AND VEGETABLES     Increase water  intake     Recommend increasing water  intake to 3 glasses a day.        Immunization History  Administered Date(s) Administered   Fluad Quad(high Dose 65+) 01/28/2021, 01/05/2022   Fluad Trivalent(High Dose 65+) 12/17/2022  INFLUENZA, HIGH DOSE SEASONAL PF 01/07/2014, 01/03/2018, 12/13/2018   Influenza Split 01/09/2010, 12/24/2011   Influenza-Unspecified 02/01/2017    PFIZER(Purple Top)SARS-COV-2 Vaccination 05/10/2019, 06/03/2019, 03/04/2020   Pneumococcal Conjugate-13 06/25/2014   Pneumococcal Polysaccharide-23 05/04/2011   Td 04/18/2004   Td (Adult),unspecified 04/18/2004   Tdap 11/09/2012    Health Maintenance  Topic Date Due   Influenza Vaccine  11/19/2023   Mammogram  12/11/2023   Zoster Vaccines- Shingrix (1 of 2) 06/10/2024 (Originally 03/11/1960)   COVID-19 Vaccine (4 - 2025-26 season) 12/19/2024 (Originally 12/20/2023)   DTaP/Tdap/Td (4 - Td or Tdap) 03/10/2025 (Originally 11/10/2022)   Medicare Annual Wellness (AWV)  08/09/2024   Diabetic kidney evaluation - eGFR measurement  08/10/2024   Diabetic kidney evaluation - Urine ACR  08/10/2024   Colonoscopy  09/10/2024   Bone Density Scan  12/11/2027   Pneumococcal Vaccine: 50+ Years  Completed   Meningococcal B Vaccine  Aged Out   Hepatitis C Screening  Discontinued    Discussed health benefits of physical activity, and encouraged her to engage in regular exercise appropriate for her age and condition.   Annual physical exam  Stage 3a chronic kidney disease (HCC) -     Comprehensive metabolic panel with GFR  CLL (chronic lymphocytic leukemia) (HCC)  Essential (primary) hypertension  Seasonal allergies -     Cetirizine  HCl; Take 1 tablet (10 mg total) by mouth daily.  Dispense: 90 tablet; Refill: 3 -     Fluticasone  Propionate; Place 2 sprays into both nostrils at bedtime.  Dispense: 16 g; Refill: 6  Mild intermittent extrinsic asthma without complication  Bursitis of left hip, unspecified bursa  Encounter for screening mammogram for breast cancer -     3D Screening Mammogram, Left and Right; Future  Hypercholesterolemia -     Lipid panel  Prediabetes -     Hemoglobin A1c  Need for influenza vaccination -     Flu vaccine HIGH DOSE PF(Fluzone Trivalent)  Pancreatic insufficiency     Annual physical exam Routine visit. Physical exam overall unremarkable except as  noted above. Routine lab work ordered as noted.  Blood pressure well-controlled. No significant issues reported. - Administered flu shot. - Ordered mammogram. - Scheduled annual wellness visit in April. - DEXA scan up-to-date.  Stage 3a chronic kidney disease  Noted.  uACR and metabolic panel up-to-date.  No acute concerns.  Continue to optimize risk factors.  CLL (chronic lymphocytic leukemia) Recent infusion completed. Blood work done with oncologist. No acute issues reported. - Continue follow-up with oncology for blood work and management.  Defer to specialist management.  Essential hypertension Chronic, stable.  Blood pressure well-controlled on carvedilol  25 mg twice daily and losartan  100 mg daily.  No changes today. - Continue monitoring blood pressure at home.  Seasonal allergic rhinitis; mild intermittent extrinsic asthma without complication Symptoms of runny nose and nighttime cough. Previous trial of cetirizine  was ineffective. Discussed potential use of cetirizine  and nasal spray.  Asthma component managed with albuterol  inhaler used every other day. Discussed potential use of inhaled corticosteroid and long-acting bronchodilator. - Prescribed Wixela inhaler for asthma management. - Prescribed cetirizine  (Zyrtec ). - Prescribed Flonase  nasal spray.  Bursitis of left hip, unspecified medicine Significant improvement after cortisone injection. Switched to diclofenac with improvement. No current pain reported. - Continue diclofenac for pain management. - Encouraged walking and activity as tolerated. - Continue to follow-up with orthopedics; defer to specialist management.  Pancreatic insufficiency Managed with Creon . Recent increase in dosage has improved symptoms. -  Continue current Creon  regimen. - Follows with gastroenterology; defer to specialist management     Return in about 4 months (around 07/08/2024) for Chronic f/u, and in July 2026 for [facility] wtih new provider.      I discussed the assessment and treatment plan with the patient  The patient was provided an opportunity to ask questions and all were answered. The patient agreed with the plan and demonstrated an understanding of the instructions.   The patient was advised to call back or seek an in-person evaluation if the symptoms worsen or if the condition fails to improve as anticipated.    LAURAINE LOISE BUOY, DO  [facility] [phone] (phone) [phone] (fax)  [facility]

## 2024-03-10 NOTE — Progress Notes (Signed)
 Pharmacy Quality Measure Review  This patient is appearing on a report for being at risk of failing the adherence measure for cholesterol (statin) medications this calendar year.   Medication: rosuvastatin  Last fill date: 02/18/24 for 90 day supply  Insurance report was not up to date. No action needed at this time.   Kevaughn Ewing E. Marsh, PharmD, CPP Clinical Pharmacist [facility] [phone]

## 2024-03-10 NOTE — Patient Instructions (Addendum)
 Recommended vaccines to get at pharmacy: Tdap (tetanus, diphtheria and pertussis), RSV (respiratory syncytial virus) and Shingrix (shingles).

## 2024-03-11 LAB — LIPID PANEL
Chol/HDL Ratio: 2.3 ratio (ref 0.0–4.4)
Cholesterol, Total: 130 mg/dL (ref 100–199)
HDL: 57 mg/dL (ref >39)
LDL Chol Calc (NIH): 53 mg/dL (ref 0–99)
Triglycerides: 108 mg/dL (ref 0–149)
VLDL Cholesterol Cal: 20 mg/dL (ref 5–40)

## 2024-03-11 LAB — COMPREHENSIVE METABOLIC PANEL WITH GFR
ALT: 25 IU/L (ref 0–32)
AST: 23 IU/L (ref 0–40)
Albumin: 4.4 g/dL (ref 3.7–4.7)
Alkaline Phosphatase: 109 IU/L (ref 48–129)
BUN/Creatinine Ratio: 28 (ref 12–28)
BUN: 32 mg/dL — ABNORMAL HIGH (ref 8–27)
Bilirubin Total: 0.3 mg/dL (ref 0.0–1.2)
CO2: 22 mmol/L (ref 20–29)
Calcium: 9.5 mg/dL (ref 8.7–10.3)
Chloride: 106 mmol/L (ref 96–106)
Creatinine, Ser: 1.16 mg/dL — ABNORMAL HIGH (ref 0.57–1.00)
Globulin, Total: 2.5 g/dL (ref 1.5–4.5)
Glucose: 100 mg/dL — ABNORMAL HIGH (ref 70–99)
Potassium: 5 mmol/L (ref 3.5–5.2)
Sodium: 144 mmol/L (ref 134–144)
Total Protein: 6.9 g/dL (ref 6.0–8.5)
eGFR: 47 mL/min/1.73 — ABNORMAL LOW (ref >59)

## 2024-03-11 LAB — HEMOGLOBIN A1C
Est. average glucose Bld gHb Est-mCnc: 134 mg/dL
Hgb A1c MFr Bld: 6.3 % — ABNORMAL HIGH (ref 4.8–5.6)

## 2024-03-22 ENCOUNTER — Ambulatory Visit: Payer: Self-pay

## 2024-03-28 ENCOUNTER — Other Ambulatory Visit: Payer: Self-pay | Admitting: Family Medicine

## 2024-03-28 DIAGNOSIS — J0120 Acute ethmoidal sinusitis, unspecified: Secondary | ICD-10-CM

## 2024-04-17 ENCOUNTER — Other Ambulatory Visit: Payer: Self-pay | Admitting: Family Medicine

## 2024-04-17 DIAGNOSIS — F5104 Psychophysiologic insomnia: Secondary | ICD-10-CM

## 2024-04-17 NOTE — Telephone Encounter (Unsigned)
 Copied from CRM #[id number]. Topic: Clinical - Medication Refill >> Apr 17, 2024 11:18 AM Shereese L wrote: Medication: zolpidem  (AMBIEN ) 10 MG tablet  Has the patient contacted their pharmacy? Yes (Agent: If no, request that the patient contact the pharmacy for the refill. If patient does not wish to contact the pharmacy document the reason why and proceed with request.) (Agent: If yes, when and what did the pharmacy advise?)  This is the patient's preferred pharmacy:  [facility] [id number] [place], [place] - [address] [phone] [address] [place] [address]   Is this the correct pharmacy for this prescription? Yes If no, delete pharmacy and type the correct one.   Has the prescription been filled recently? Yes  Is the patient out of the medication? Yes  Has the patient been seen for an appointment in the last year OR does the patient have an upcoming appointment? Yes  Can we respond through MyChart? Yes  Agent: Please be advised that Rx refills may take up to 3 business days. We ask that you follow-up with your pharmacy.

## 2024-04-18 NOTE — Telephone Encounter (Signed)
 Requested medication (s) are due for refill today: Yes  Requested medication (s) are on the active medication list: Yes  Last refill:  12/31/23  Future visit scheduled: No  Notes to clinic:  Not delegated.    Requested Prescriptions  Pending Prescriptions Disp Refills   zolpidem  (AMBIEN ) 10 MG tablet 90 tablet 0    Sig: Take 0.5-1 tablets (5-10 mg total) by mouth at bedtime.     Not Delegated - Psychiatry:  Anxiolytics/Hypnotics Failed - 04/18/2024  3:43 PM      Failed - This refill cannot be delegated      Failed - Urine Drug Screen completed in last 360 days      Passed - Valid encounter within last 6 months    Recent Outpatient Visits           1 month ago Annual physical exam   [facility] [place], Lauraine SAILOR, DO   4 months ago Essential (primary) hypertension   [facility] [place], Lauraine SAILOR, DO   8 months ago Seasonal allergic rhinitis due to pollen   [facility] [place], Lauraine SAILOR, DO   8 months ago Subacute ethmoidal sinusitis   [facility] [place], Janna, PA-C

## 2024-04-18 NOTE — Telephone Encounter (Signed)
 LOV- 03/10/2024 NOV- 08/15/2024 LRF- 12/31/2023 Zolpidem  10 mg 90 x 1

## 2024-04-19 NOTE — Telephone Encounter (Signed)
 Copied from CRM #[id number]. Topic: Clinical - Medication Question >> Apr 19, 2024 10:41 AM Berwyn MATSU wrote: Reason for CRM: Patient called in to check status of  Of Zolpidem . Per patient she will run out tomorrow and would like an update. I advised currently pending at this time awaiting approval from MD. Per patient she would like to send a message regarding this request.   May you please assist.

## 2024-04-21 ENCOUNTER — Other Ambulatory Visit: Payer: Self-pay | Admitting: Nurse Practitioner

## 2024-04-21 MED ORDER — ZOLPIDEM TARTRATE 10 MG PO TABS: 5.0000 mg | ORAL_TABLET | Freq: Every day | ORAL | 0 refills | Status: AC

## 2024-05-02 ENCOUNTER — Telehealth: Payer: Self-pay | Admitting: Oncology

## 2024-05-02 DIAGNOSIS — D508 Other iron deficiency anemias: Secondary | ICD-10-CM

## 2024-05-02 NOTE — Addendum Note (Signed)
 Addended by: Leesha Veno on: 05/02/2024 03:05 PM   Modules accepted: Orders

## 2024-05-02 NOTE — Telephone Encounter (Signed)
 Patient called wanting to schedule some infusions. She stated that she was feeling a lack of energy and they had helped in the past. I told her I would have to reach out to the team and see what Dr. Melanee wanted, but that she would need labs first to determine what was needed. Please advise

## 2024-05-02 NOTE — Telephone Encounter (Signed)
 She can come for CBC ferritin and iron  studies and B12 levels this month.  Keep other future follow-up appointments as scheduled

## 2024-05-03 ENCOUNTER — Inpatient Hospital Stay: Attending: Oncology

## 2024-05-03 DIAGNOSIS — E611 Iron deficiency: Secondary | ICD-10-CM | POA: Insufficient documentation

## 2024-05-03 DIAGNOSIS — D508 Other iron deficiency anemias: Secondary | ICD-10-CM

## 2024-05-03 DIAGNOSIS — C9110 Chronic lymphocytic leukemia of B-cell type not having achieved remission: Secondary | ICD-10-CM | POA: Insufficient documentation

## 2024-05-03 LAB — CBC (CANCER CENTER ONLY)
HCT: 41.7 % (ref 36.0–46.0)
Hemoglobin: 13.5 g/dL (ref 12.0–15.0)
MCH: 29.7 pg (ref 26.0–34.0)
MCHC: 32.4 g/dL (ref 30.0–36.0)
MCV: 91.9 fL (ref 80.0–100.0)
Platelet Count: 218 K/uL (ref 150–400)
RBC: 4.54 MIL/uL (ref 3.87–5.11)
RDW: 14.9 % (ref 11.5–15.5)
WBC Count: 16.5 K/uL — ABNORMAL HIGH (ref 4.0–10.5)
nRBC: 0 % (ref 0.0–0.2)

## 2024-05-03 LAB — FERRITIN: Ferritin: 293 ng/mL (ref 11–307)

## 2024-05-03 LAB — IRON AND TIBC
Iron: 94 ug/dL (ref 28–170)
Saturation Ratios: 30 % (ref 10.4–31.8)
TIBC: 318 ug/dL (ref 250–450)
UIBC: 224 ug/dL

## 2024-05-03 LAB — VITAMIN B12: Vitamin B-12: 501 pg/mL (ref 180–914)

## 2024-08-10 ENCOUNTER — Inpatient Hospital Stay

## 2024-08-15 ENCOUNTER — Ambulatory Visit

## 2025-02-09 ENCOUNTER — Inpatient Hospital Stay: Admitting: Oncology

## 2025-02-09 ENCOUNTER — Inpatient Hospital Stay
